# Patient Record
Sex: Female | Born: 1935 | Race: Black or African American | Hispanic: No | Smoking: Never smoker
Health system: Southern US, Community
[De-identification: ages and names within clinical notes are randomized; demographics above are authoritative.]

## PROBLEM LIST (undated history)

## (undated) DIAGNOSIS — K219 Gastro-esophageal reflux disease without esophagitis: Secondary | ICD-10-CM

## (undated) DIAGNOSIS — I10 Essential (primary) hypertension: Secondary | ICD-10-CM

## (undated) DIAGNOSIS — N186 End stage renal disease: Secondary | ICD-10-CM

## (undated) DIAGNOSIS — Z992 Dependence on renal dialysis: Secondary | ICD-10-CM

## (undated) DIAGNOSIS — E1122 Type 2 diabetes mellitus with diabetic chronic kidney disease: Secondary | ICD-10-CM

## (undated) DIAGNOSIS — R0600 Dyspnea, unspecified: Secondary | ICD-10-CM

## (undated) DIAGNOSIS — I350 Nonrheumatic aortic (valve) stenosis: Secondary | ICD-10-CM

## (undated) DIAGNOSIS — I4892 Unspecified atrial flutter: Secondary | ICD-10-CM

## (undated) DIAGNOSIS — I4891 Unspecified atrial fibrillation: Secondary | ICD-10-CM

## (undated) DIAGNOSIS — J189 Pneumonia, unspecified organism: Secondary | ICD-10-CM

## (undated) HISTORY — DX: End stage renal disease: N18.6

## (undated) HISTORY — DX: Type 2 diabetes mellitus with diabetic chronic kidney disease: E11.22

## (undated) HISTORY — PX: ABDOMINAL HYSTERECTOMY: SHX81

## (undated) HISTORY — PX: COLONOSCOPY: SHX174

## (undated) HISTORY — DX: Unspecified atrial flutter: I48.92

## (undated) HISTORY — PX: TUBAL LIGATION: SHX77

## (undated) HISTORY — DX: Dependence on renal dialysis: Z99.2

---

## 2013-04-14 ENCOUNTER — Encounter (HOSPITAL_BASED_OUTPATIENT_CLINIC_OR_DEPARTMENT_OTHER): Payer: Self-pay | Admitting: *Deleted

## 2013-04-14 ENCOUNTER — Inpatient Hospital Stay (HOSPITAL_BASED_OUTPATIENT_CLINIC_OR_DEPARTMENT_OTHER)
Admission: EM | Admit: 2013-04-14 | Discharge: 2013-04-16 | DRG: 684 | Disposition: A | Discharge status: Skilled Nursing Facility | Payer: Medicare Other | Attending: Internal Medicine | Admitting: Internal Medicine

## 2013-04-14 DIAGNOSIS — D638 Anemia in other chronic diseases classified elsewhere: Secondary | ICD-10-CM | POA: Diagnosis present

## 2013-04-14 DIAGNOSIS — I10 Essential (primary) hypertension: Secondary | ICD-10-CM | POA: Diagnosis present

## 2013-04-14 DIAGNOSIS — I129 Hypertensive chronic kidney disease with stage 1 through stage 4 chronic kidney disease, or unspecified chronic kidney disease: Secondary | ICD-10-CM | POA: Diagnosis present

## 2013-04-14 DIAGNOSIS — N179 Acute kidney failure, unspecified: Secondary | ICD-10-CM

## 2013-04-14 DIAGNOSIS — E119 Type 2 diabetes mellitus without complications: Secondary | ICD-10-CM | POA: Diagnosis present

## 2013-04-14 DIAGNOSIS — D649 Anemia, unspecified: Secondary | ICD-10-CM

## 2013-04-14 DIAGNOSIS — Z794 Long term (current) use of insulin: Secondary | ICD-10-CM | POA: Diagnosis present

## 2013-04-14 DIAGNOSIS — N184 Chronic kidney disease, stage 4 (severe): Secondary | ICD-10-CM | POA: Diagnosis present

## 2013-04-14 HISTORY — DX: Essential (primary) hypertension: I10

## 2013-04-14 LAB — CBC WITH DIFFERENTIAL/PLATELET
Basophils Absolute: 0 K/uL (ref 0.0–0.1)
Basophils Relative: 0 % (ref 0–1)
Eosinophils Absolute: 0.2 K/uL (ref 0.0–0.7)
Eosinophils Relative: 4 % (ref 0–5)
HCT: 22.2 % — ABNORMAL LOW (ref 36.0–46.0)
Hemoglobin: 7.7 g/dL — ABNORMAL LOW (ref 12.0–15.0)
Lymphocytes Relative: 25 % (ref 12–46)
Lymphs Abs: 1.3 K/uL (ref 0.7–4.0)
MCH: 29.8 pg (ref 26.0–34.0)
MCHC: 34.7 g/dL (ref 30.0–36.0)
MCV: 86 fL (ref 78.0–100.0)
Monocytes Absolute: 0.4 K/uL (ref 0.1–1.0)
Monocytes Relative: 8 % (ref 3–12)
Neutro Abs: 3.4 K/uL (ref 1.7–7.7)
Neutrophils Relative %: 64 % (ref 43–77)
Platelets: 214 K/uL (ref 150–400)
RBC: 2.58 MIL/uL — ABNORMAL LOW (ref 3.87–5.11)
RDW: 12.5 % (ref 11.5–15.5)
WBC: 5.4 K/uL (ref 4.0–10.5)

## 2013-04-14 LAB — OCCULT BLOOD X 1 CARD TO LAB, STOOL: Fecal Occult Bld: NEGATIVE

## 2013-04-14 LAB — URINE MICROSCOPIC-ADD ON

## 2013-04-14 LAB — BASIC METABOLIC PANEL
BUN: 62 mg/dL — ABNORMAL HIGH (ref 6–23)
Chloride: 104 mEq/L (ref 96–112)
GFR calc Af Amer: 11 mL/min — ABNORMAL LOW (ref >90)
Potassium: 5.3 mEq/L — ABNORMAL HIGH (ref 3.5–5.1)
Sodium: 134 mEq/L — ABNORMAL LOW (ref 135–145)

## 2013-04-14 LAB — URINALYSIS, ROUTINE W REFLEX MICROSCOPIC
Bilirubin Urine: NEGATIVE
Glucose, UA: NEGATIVE mg/dL
Ketones, ur: NEGATIVE mg/dL
Leukocytes, UA: NEGATIVE
Nitrite: NEGATIVE
Protein, ur: 100 mg/dL — AB
Specific Gravity, Urine: 1.009 (ref 1.005–1.030)
Urobilinogen, UA: 0.2 mg/dL (ref 0.0–1.0)
pH: 6 (ref 5.0–8.0)

## 2013-04-14 MED ORDER — SODIUM CHLORIDE 0.9 % IV BOLUS (SEPSIS)
1000.0000 mL | Freq: Once | INTRAVENOUS | Status: AC
  Administered 2013-04-14: 1000 mL via INTRAVENOUS

## 2013-04-14 NOTE — ED Notes (Signed)
Pt seen by PMD this am with blood drawn for 6 month visit , callt his afternoon bun and crt " high"

## 2013-04-14 NOTE — ED Provider Notes (Signed)
CSN: [id number]     Arrival date & time 04/14/13  7829 History     First MD Initiated Contact with Patient 04/14/13 2046     Chief Complaint  Patient presents with  . abdnormal labs    (Consider location/radiation/quality/duration/timing/severity/associated sxs/prior Treatment) HPI This asymptomatic 79 year old female has a history of type 2 diabetes and hypertension and had routine blood drawn today at routine 70-month checkup and was called in and told that she had elevated kidney tests and told to come to the hospital. she is no chest pain no palpitations no shortness of breath no edema no nausea no vomiting no confusion no decreased urination or other concerns. There is no treatment prior to arrival. Past Medical History  Diagnosis Date  . Hypertension   . Diabetes mellitus without complication    Past Surgical History  Procedure Laterality Date  . Abdominal hysterectomy    . Tubal ligation     History reviewed. No pertinent family history. History  Substance Use Topics  . Smoking status: Never Smoker   . Smokeless tobacco: Not on file  . Alcohol Use: No   OB History   Grav Para Term Preterm Abortions TAB SAB Ect Mult Living                 Review of Systems 10 Systems reviewed and are negative for acute change except as noted in the HPI. Allergies  Asa; Codeine; and Penicillins  Home Medications   No current outpatient prescriptions on file. BP 197/83  Pulse 70  Temp(Src) 98.6 F (37 C) (Oral)  Resp 16  Ht 5\' 5"  (1.651 m)  Wt 156 lb 8 oz (70.988 kg)  BMI 26.04 kg/m2  SpO2 98% Physical Exam  Nursing note and vitals reviewed. Constitutional:  Awake, alert, nontoxic appearance.  HENT:  Head: Atraumatic.  Eyes: Right eye exhibits no discharge. Left eye exhibits no discharge.  Neck: Neck supple.  Cardiovascular: Normal rate and regular rhythm.   No murmur heard. Pulmonary/Chest: Effort normal and breath sounds normal. No respiratory distress. She has no  wheezes. She has no rales. She exhibits no tenderness.  Abdominal: Soft. Bowel sounds are normal. She exhibits no distension. There is no tenderness. There is no rebound.  Musculoskeletal: She exhibits no edema and no tenderness.  Baseline ROM, no obvious new focal weakness.  Neurological: She is alert.  Mental status and motor strength appears baseline for patient and situation.  Skin: No rash noted.  Psychiatric: She has a normal mood and affect.    ED Course   Procedures (including critical care time) ECG: Sinus rhythm, first degree AV block, ventricular rate 68, normal axis, inferior lateral inverted T waves with slight ST depression, no comparison ECG available  [place] present for rectal.  D/w [facility] for transfer. Patient / Family / Caregiver informed of clinical course, understand medical decision-making process, and agree with plan. Labs Reviewed  BASIC METABOLIC PANEL - Abnormal; Notable for the following:    Sodium 134 (*)    Potassium 5.3 (*)    CO2 17 (*)    Glucose, Bld 114 (*)    BUN 62 (*)    Creatinine, Ser 4.20 (*)    GFR calc non Af Amer 9 (*)    GFR calc Af Amer 11 (*)    All other components within normal limits  CBC WITH DIFFERENTIAL - Abnormal; Notable for the following:    RBC 2.58 (*)    Hemoglobin 7.7 (*)    HCT  22.2 (*)    All other components within normal limits  URINALYSIS, ROUTINE W REFLEX MICROSCOPIC - Abnormal; Notable for the following:    Hgb urine dipstick TRACE (*)    Protein, ur 100 (*)    All other components within normal limits  GLUCOSE, CAPILLARY - Abnormal; Notable for the following:    Glucose-Capillary 152 (*)    All other components within normal limits  LIPID PANEL - Abnormal; Notable for the following:    LDL Cholesterol 101 (*)    All other components within normal limits  GLUCOSE, CAPILLARY - Abnormal; Notable for the following:    Glucose-Capillary 102 (*)    All other components within normal limits  URINE  MICROSCOPIC-ADD ON  OCCULT BLOOD X 1 CARD TO LAB, STOOL  GLUCOSE, CAPILLARY  HEMOGLOBIN A1C   US Renal  04/15/2013   *RADIOLOGY REPORT*  Clinical Data: Acute renal insufficiency  RENAL/URINARY TRACT ULTRASOUND COMPLETE  Comparison:  None.  Findings:  Right Kidney:  11.3 cm in length.  Increased cortical echogenicity. No hydronephrosis or mass.  Left Kidney:  9.6 cm in length.  Increased cortical echogenicity. No hydronephrosis or solid mass.  2.2 cm simple cyst in the upper pole of the left kidney.  Bladder:  Unremarkable.  IMPRESSION: No hydronephrosis.  Increased cortical echogenicity compatible with medical renal parenchymal disease.   Original Report Authenticated By: Jolaine Click, M.D.   1. Acute renal failure   2. Anemia   3. DM2 (diabetes mellitus, type 2)   4. HTN (hypertension)     MDM  The patient appears reasonably stabilized for transfer considering the current resources, flow, and capabilities available in the ED at this time, and I doubt any other [facility] requiring further screening and/or treatment in the ED prior to transfer.  Hurman Horn, MD 04/15/13 [phone]

## 2013-04-15 ENCOUNTER — Observation Stay (HOSPITAL_COMMUNITY): Payer: Medicare Other

## 2013-04-15 ENCOUNTER — Encounter (HOSPITAL_COMMUNITY): Payer: Self-pay

## 2013-04-15 DIAGNOSIS — E119 Type 2 diabetes mellitus without complications: Secondary | ICD-10-CM | POA: Diagnosis present

## 2013-04-15 DIAGNOSIS — D649 Anemia, unspecified: Secondary | ICD-10-CM

## 2013-04-15 DIAGNOSIS — N179 Acute kidney failure, unspecified: Secondary | ICD-10-CM

## 2013-04-15 DIAGNOSIS — I10 Essential (primary) hypertension: Secondary | ICD-10-CM

## 2013-04-15 LAB — LIPID PANEL
Cholesterol: 181 mg/dL (ref 0–200)
Triglycerides: 91 mg/dL (ref <150)

## 2013-04-15 LAB — GLUCOSE, CAPILLARY
Glucose-Capillary: 152 mg/dL — ABNORMAL HIGH (ref 70–99)
Glucose-Capillary: 95 mg/dL (ref 70–99)
Glucose-Capillary: 159 mg/dL — ABNORMAL HIGH (ref 70–99)

## 2013-04-15 LAB — PROTEIN / CREATININE RATIO, URINE: Protein Creatinine Ratio: 2.94 — ABNORMAL HIGH (ref 0.00–0.15)

## 2013-04-15 LAB — HEMOGLOBIN A1C: Mean Plasma Glucose: 140 mg/dL — ABNORMAL HIGH (ref <117)

## 2013-04-15 MED ORDER — HYDRALAZINE HCL 50 MG PO TABS
50.0000 mg | ORAL_TABLET | Freq: Three times a day (TID) | ORAL | Status: DC
  Administered 2013-04-15 – 2013-04-16 (×3): 50 mg via ORAL
  Filled 2013-04-15 (×6): qty 1

## 2013-04-15 MED ORDER — CLONIDINE HCL 0.2 MG PO TABS
0.2000 mg | ORAL_TABLET | Freq: Two times a day (BID) | ORAL | Status: DC
  Administered 2013-04-15 – 2013-04-16 (×3): 0.2 mg via ORAL
  Filled 2013-04-15 (×4): qty 1

## 2013-04-15 MED ORDER — INSULIN ASPART 100 UNIT/ML ~~LOC~~ SOLN: 0.0000 [IU] | Freq: Every day | SUBCUTANEOUS | Status: DC

## 2013-04-15 MED ORDER — SODIUM BICARBONATE 650 MG PO TABS
650.0000 mg | ORAL_TABLET | Freq: Three times a day (TID) | ORAL | Status: DC
  Administered 2013-04-15 – 2013-04-16 (×6): 650 mg via ORAL
  Filled 2013-04-15 (×9): qty 1

## 2013-04-15 MED ORDER — SODIUM CHLORIDE 0.9 % IV SOLN
INTRAVENOUS | Status: DC
  Administered 2013-04-15: 75 mL/h via INTRAVENOUS

## 2013-04-15 MED ORDER — LABETALOL HCL 300 MG PO TABS
300.0000 mg | ORAL_TABLET | Freq: Three times a day (TID) | ORAL | Status: DC
  Administered 2013-04-15 – 2013-04-16 (×4): 300 mg via ORAL
  Filled 2013-04-15 (×5): qty 1

## 2013-04-15 MED ORDER — LINAGLIPTIN 5 MG PO TABS
5.0000 mg | ORAL_TABLET | Freq: Every day | ORAL | Status: DC
Start: 2013-04-15 — End: 2013-04-16
  Administered 2013-04-15 – 2013-04-16 (×2): 5 mg via ORAL
  Filled 2013-04-15 (×2): qty 1

## 2013-04-15 MED ORDER — GLYBURIDE 5 MG PO TABS
5.0000 mg | ORAL_TABLET | Freq: Every day | ORAL | Status: DC
  Administered 2013-04-15 – 2013-04-16 (×2): 5 mg via ORAL
  Filled 2013-04-15 (×3): qty 1

## 2013-04-15 MED ORDER — INSULIN ASPART 100 UNIT/ML ~~LOC~~ SOLN
0.0000 [IU] | Freq: Three times a day (TID) | SUBCUTANEOUS | Status: DC
  Administered 2013-04-16: 2 [IU] via SUBCUTANEOUS

## 2013-04-15 MED ORDER — SODIUM CHLORIDE 0.9 % IV SOLN
INTRAVENOUS | Status: AC
  Administered 2013-04-15: 05:00:00 via INTRAVENOUS

## 2013-04-15 MED ORDER — LABETALOL HCL 200 MG PO TABS
200.0000 mg | ORAL_TABLET | Freq: Two times a day (BID) | ORAL | Status: DC
  Administered 2013-04-15: 200 mg via ORAL
  Filled 2013-04-15 (×2): qty 1

## 2013-04-15 NOTE — H&P (Signed)
[facility] History and Physical  Angela Trujillo ZOX:[id number] DOB: 1930-10-26    PCP:   Dante Gang.  Chief Complaint: sent in from PCP for elevated Cr.  HPI: Angela Trujillo is an 79 y.o. female with HTN, DM, and anemia, but doesn't think she has any CKD sent from PCP for elevated Cr.  She presented to [facility], but unfortunately there was no record of prior Cr of stages of her CKD.  Evaluation in the ER showed that she has Cr of 4.2 and bicarb of 17, with Hb of 7.7 grams per DL.  Dr Fonnie Jarvis asked me to admit her for work up since she has elevated Cr and anemia.  She is totally asymptomatic.  Rewiew of Systems:  Constitutional: Negative for malaise, fever and chills. No significant weight loss or weight gain Eyes: Negative for eye pain, redness and discharge, diplopia, visual changes, or flashes of light. ENMT: Negative for ear pain, hoarseness, nasal congestion, sinus pressure and sore throat. No headaches; tinnitus, drooling, or problem swallowing. Cardiovascular: Negative for chest pain, palpitations, diaphoresis, dyspnea and peripheral edema. ; No orthopnea, PND Respiratory: Negative for cough, hemoptysis, wheezing and stridor. No pleuritic chestpain. Gastrointestinal: Negative for nausea, vomiting, diarrhea, constipation, abdominal pain, melena, blood in stool, hematemesis, jaundice and rectal bleeding.    Genitourinary: Negative for frequency, dysuria, incontinence,flank pain and hematuria; Musculoskeletal: Negative for back pain and neck pain. Negative for swelling and trauma.;  Skin: . Negative for pruritus, rash, abrasions, bruising and skin lesion.; ulcerations Neuro: Negative for headache, lightheadedness and neck stiffness. Negative for weakness, altered level of consciousness , altered mental status, extremity weakness, burning feet, involuntary movement, seizure and syncope.  Psych: negative for anxiety, depression, insomnia, tearfulness, panic attacks, hallucinations,  paranoia, suicidal or homicidal ideation    Past Medical History  Diagnosis Date  . Hypertension   . Diabetes mellitus without complication     Past Surgical History  Procedure Laterality Date  . Abdominal hysterectomy    . Tubal ligation      Medications:  HOME MEDS: Prior to Admission medications   Medication Sig Start Date End Date Taking? Authorizing Provider  cloNIDine (CATAPRES) 0.2 MG tablet Take 0.2 mg by mouth 2 (two) times daily.   Yes Historical Provider, MD  glyBURIDE (DIABETA) 5 MG tablet Take 5 mg by mouth daily with breakfast.   Yes Historical Provider, MD  labetalol (NORMODYNE) 200 MG tablet Take 200 mg by mouth 2 (two) times daily.   Yes Historical Provider, MD  saxagliptin HCl (ONGLYZA) 2.5 MG TABS tablet Take 5 mg by mouth daily.   Yes Historical Provider, MD  sodium bicarbonate 650 MG tablet Take 650 mg by mouth 4 (four) times daily.   Yes Historical Provider, MD     Allergies:  Allergies  Allergen Reactions  . Asa (Aspirin)   . Codeine   . Penicillins     Social History:   reports that she has never smoked. She does not have any smokeless tobacco history on file. She reports that she does not drink alcohol. Her drug history is not on file.  Family History: History reviewed. No pertinent family history.   Physical Exam: Filed Vitals:   04/14/13 2124 04/15/13 0029 04/15/13 0302 04/15/13 0500  BP: 171/71 178/72 176/67 197/83  Pulse: 69 68 69 70  Temp:    98.6 F (37 C)  TempSrc:    Oral  Resp: 15 17 14 16   Height:    5\' 5"  (1.651 m)  Weight:  70.988 kg (156 lb 8 oz)  SpO2: 100% 99% 100% 98%   Blood pressure 197/83, pulse 70, temperature 98.6 F (37 C), temperature source Oral, resp. rate 16, height 5\' 5"  (1.651 m), weight 70.988 kg (156 lb 8 oz), SpO2 98.00%.  GEN:  Pleasant patient lying in the stretcher in no acute distress; cooperative with exam. PSYCH:  alert and oriented x4; does not appear anxious or depressed; affect is  appropriate. HEENT: Mucous membranes pink and anicteric; PERRLA; EOM intact; no cervical lymphadenopathy nor thyromegaly or carotid bruit; no JVD; There were no stridor. Neck is very supple. Breasts:: Not examined CHEST WALL: No tenderness CHEST: Normal respiration, clear to auscultation bilaterally.  HEART: Regular rate and rhythm.  There are no murmur, rub, or gallops.   BACK: No kyphosis or scoliosis; no CVA tenderness ABDOMEN: soft and non-tender; no masses, no organomegaly, normal abdominal bowel sounds; no pannus; no intertriginous candida. There is no rebound and no distention. Rectal Exam: Not done EXTREMITIES: No bone or joint deformity; age-appropriate arthropathy of the hands and knees; no edema; no ulcerations.  There is no calf tenderness. Genitalia: not examined PULSES: 2+ and symmetric SKIN: Normal hydration no rash or ulceration CNS: Cranial nerves 2-12 grossly intact no focal lateralizing neurologic deficit.  Speech is fluent; uvula elevated with phonation, facial symmetry and tongue midline. DTR are normal bilaterally, cerebella exam is intact, barbinski is negative and strengths are equaled bilaterally.  No sensory loss.   Labs on Admission:  Basic Metabolic Panel:  Recent Labs Lab 04/14/13 2057  NA 134*  K 5.3*  CL 104  CO2 17*  GLUCOSE 114*  BUN 62*  CREATININE 4.20*  CALCIUM 8.8   Liver Function Tests: No results found for this basename: AST, ALT, ALKPHOS, BILITOT, PROT, ALBUMIN,  in the last 168 hours No results found for this basename: LIPASE, AMYLASE,  in the last 168 hours No results found for this basename: AMMONIA,  in the last 168 hours CBC:  Recent Labs Lab 04/14/13 2057  WBC 5.4  NEUTROABS 3.4  HGB 7.7*  HCT 22.2*  MCV 86.0  PLT 214   Cardiac Enzymes: No results found for this basename: CKTOTAL, CKMB, CKMBINDEX, TROPONINI,  in the last 168 hours  CBG: No results found for this basename: GLUCAP,  in the last 168 hours   Radiological  Exams on Admission: No results found.  Assessment/Plan Present on Admission:  . HTN (hypertension) . DM2 (diabetes mellitus, type 2) AKI Anemia.  PLAN:  I don't know what her H/H nor Cr are at baseline.  Given that her PCP sent her in, I assume that it was a change.  She knew about her anemia, and is asymptomatic, so I think it is chronic.  I strongly suspect she has CKD, given her medication list has bicarb tablets.  Please get more information from her PCP to elucidate the process.  I will give her IVF gently, and obtain a renal US.  I have not transfuse her as I suspect she would not need to.  She is stable, full code, and will be admitted to [facility] service.  Updated patient and family on her situation.  Thank you for allowng me to partake in the care of this nice patient.  Other plans as per orders.  Code Status: FULL Unk Lightning, MD. [facility] Pager [phone] 7pm to 7am.  04/15/2013, 6:56 AM

## 2013-04-15 NOTE — Progress Notes (Signed)
Patient arrived on unit via EMS.  [facility] Admissions notified of patient's arrival.  Patient alert and oriented with no complaints of pain.  BP 176/67, HR 69, R 14, O2 100% on ra.

## 2013-04-15 NOTE — Progress Notes (Signed)
Patient seen and examined Admit for acute kidney injury, Most likely is anemia of chronic disease Renal ultrasound pending Gentle hydration Nephrology consultation if no improvement

## 2013-04-15 NOTE — Consult Note (Signed)
Angela Trujillo 04/15/2013 Angela Trujillo D Requesting Physician:  Dr Susie Cassette  Reason for Consult:  Renal failure, uncertain duration HPI: The patient is a 79 y.o. year-old with hx of DM and HTN who was seeing PCP yesterday for routine 6 mo follow up and was called to come to ED because of elevated "creatinine".  Creatinine here is 4.20 with BUN 62.  Hb 7.7, WBC 5.4 and normal plts 214k.  UA is negative. Renal US shows 11.3 and 9.5cm kidneys with inc'd echogenicity bilat.  BP is normal to high. EKG shows NSR with inf-lateral downsloping ST's and TWI.    Meds at home include clonidine, glyburide, labetalol, NaHCO3, saxagliptin and Fe pills.  There are no old notes in EPIC from [facility].  No old creatinine available.   Patient denies and complaints, entirely negative ROS.  Has 5 children, one son is diabetic, 74 yo and has been on dialysis for 3 years in [place] area.    ROS  no fever HA  no cp sob  no n/v/d  no abd pain  no dysuria or difficulty voiding or hematuria  no jt pain, hairloss or mouth ulcers  no skin lesions  no paralysis or numbness of ext  no ankle swelling  no hx of blood clots  Past Medical History  Past Medical History  Diagnosis Date  . Hypertension   . Diabetes mellitus without complication    Past Surgical History  Past Surgical History  Procedure Laterality Date  . Abdominal hysterectomy    . Tubal ligation     Family History History reviewed. No pertinent family history. Social History  reports that she has never smoked. She does not have any smokeless tobacco history on file. She reports that she does not drink alcohol. Her drug history is not on file. Allergies  Allergies  Allergen Reactions  . Asa (Aspirin)   . Codeine   . Penicillins    Home medications Prior to Admission medications   Medication Sig Start Date End Date Taking? Authorizing Provider  cloNIDine (CATAPRES) 0.2 MG tablet Take 0.2 mg by mouth 2 (two) times daily.   Yes Historical  Provider, MD  ferrous sulfate 325 (65 FE) MG tablet Take 325 mg by mouth daily with breakfast.   Yes Historical Provider, MD  glyBURIDE (DIABETA) 5 MG tablet Take 5 mg by mouth daily with breakfast.   Yes Historical Provider, MD  labetalol (NORMODYNE) 200 MG tablet Take 200 mg by mouth 2 (two) times daily.   Yes Historical Provider, MD  saxagliptin HCl (ONGLYZA) 5 MG TABS tablet Take 5 mg by mouth daily.   Yes Historical Provider, MD  sodium bicarbonate 650 MG tablet Take 650 mg by mouth daily.   Yes Historical Provider, MD   Liver Function Tests No results found for this basename: AST, ALT, ALKPHOS, BILITOT, PROT, ALBUMIN,  in the last 168 hours No results found for this basename: LIPASE, AMYLASE,  in the last 168 hours CBC  Recent Labs Lab 04/14/13 2057  WBC 5.4  NEUTROABS 3.4  HGB 7.7*  HCT 22.2*  MCV 86.0  PLT 214   Basic Metabolic Panel  Recent Labs Lab 04/14/13 2057  NA 134*  K 5.3*  CL 104  CO2 17*  GLUCOSE 114*  BUN 62*  CREATININE 4.20*  CALCIUM 8.8    Physical Exam  Blood pressure 158/76, pulse 74, temperature 97.8 F (36.6 C), temperature source Oral, resp. rate 16, height 5\' 5"  (1.651 m), weight 70.988 kg (156 lb 8  oz), SpO2 98.00%. Gen: alert older adult female, looks younger than stated age, no distress, calm Skin: no rash, cyanosis HEENT:  EOMI, sclera anicteric, throat clear, +left ant cervical nodes Neck: + JVD, bilat bruits vs transmitted M Chest: clear bilat no rales or wheezing, good air movement CV: regular, no rub, +holosystolic blowing M at apex, +2/6 SEM at base > carotids, fem/popliteal/pedal pulses strong Abdomen: soft, nontender, no HSM no ascites or masses Ext: no LE edema, no joint effusion, atrophic skin chgs LE's, no gangrene/ulcers Neuro: alert, Ox3, nonfocal   Impression/Rec:  1 Acute vs CKD- do not have old lab data; anemia is worrisome for CKD and/or myeloma.  UA benign, minimal protein, US echogenic kidneys one of which is on the  small side.  No vol depletion or overload on exam, not on nsaid's, acei or other nephrotoxins.  Long hx of DM but never insulin and prob not severe diabetic disease. Also 20 yr hx of HTN and BP high.  Will order some serologies, myeloma screens, anemia w/u, cont IVF's, urine lytes, phos, PTH, ECHO.  No indication for HD at this time. Will follow. Need to get office records on Monday. 2 DM 7yrs 3 HTN 20 yrs 4 Heart murmur x 2  Vinson Moselle  MD Pager [phone]    Cell  [phone] 04/15/2013, 7:58 PM

## 2013-04-15 NOTE — Progress Notes (Signed)
PENDING ACCEPTANCE TRANFER NOTE:  Call received from:  Dr Fonnie Jarvis of [facility]  REASON FOR REQUESTING TRANSFER: AKI and anemia.  HPI:    Asymptomatic sent in by PCP for abnormal labs.  Cr 4 without known baseline.  Hb 7.7 g/DL without prior labs available     PLAN:  According to telephone report, this patient was accepted for transfer to [facility].   I have requested an order be written to call Flow Manager at [phone] upon patient arrival to the floor for final physician assignment who will do the admission and give admitting orders.  SIGNEDHouston Siren, MD [facility]  04/15/2013, 12:05 AM

## 2013-04-16 DIAGNOSIS — N179 Acute kidney failure, unspecified: Principal | ICD-10-CM

## 2013-04-16 LAB — IRON AND TIBC
Saturation Ratios: 20 % (ref 20–55)
TIBC: 240 ug/dL — ABNORMAL LOW (ref 250–470)
UIBC: 192 ug/dL (ref 125–400)

## 2013-04-16 LAB — RENAL FUNCTION PANEL
Albumin: 2.8 g/dL — ABNORMAL LOW (ref 3.5–5.2)
CO2: 16 mEq/L — ABNORMAL LOW (ref 19–32)
Calcium: 8 mg/dL — ABNORMAL LOW (ref 8.4–10.5)
Creatinine, Ser: 3.84 mg/dL — ABNORMAL HIGH (ref 0.50–1.10)
GFR calc Af Amer: 12 mL/min — ABNORMAL LOW (ref >90)
GFR calc non Af Amer: 10 mL/min — ABNORMAL LOW (ref >90)
Phosphorus: 4.5 mg/dL (ref 2.3–4.6)
Sodium: 133 mEq/L — ABNORMAL LOW (ref 135–145)

## 2013-04-16 LAB — HEPATIC FUNCTION PANEL
ALT: 10 U/L (ref 0–35)
Albumin: 2.8 g/dL — ABNORMAL LOW (ref 3.5–5.2)
Alkaline Phosphatase: 88 U/L (ref 39–117)

## 2013-04-16 LAB — COMPREHENSIVE METABOLIC PANEL
ALT: 9 U/L (ref 0–35)
Alkaline Phosphatase: 87 U/L (ref 39–117)
CO2: 15 mEq/L — ABNORMAL LOW (ref 19–32)
GFR calc Af Amer: 11 mL/min — ABNORMAL LOW (ref >90)
GFR calc non Af Amer: 10 mL/min — ABNORMAL LOW (ref >90)
Glucose, Bld: 107 mg/dL — ABNORMAL HIGH (ref 70–99)
Potassium: 4.7 mEq/L (ref 3.5–5.1)
Sodium: 135 mEq/L (ref 135–145)
Total Bilirubin: 0.2 mg/dL — ABNORMAL LOW (ref 0.3–1.2)

## 2013-04-16 LAB — VITAMIN B12: Vitamin B-12: 523 pg/mL (ref 211–911)

## 2013-04-16 LAB — GLUCOSE, CAPILLARY: Glucose-Capillary: 98 mg/dL (ref 70–99)

## 2013-04-16 LAB — CBC
HCT: 21.4 % — ABNORMAL LOW (ref 36.0–46.0)
Hemoglobin: 7.7 g/dL — ABNORMAL LOW (ref 12.0–15.0)
WBC: 5.3 10*3/uL (ref 4.0–10.5)

## 2013-04-16 LAB — FOLATE: Folate: >20 ng/mL

## 2013-04-16 LAB — URIC ACID: Uric Acid, Serum: 8.4 mg/dL — ABNORMAL HIGH (ref 2.4–7.0)

## 2013-04-16 MED ORDER — HYDRALAZINE HCL 100 MG PO TABS: 100.0000 mg | ORAL_TABLET | Freq: Three times a day (TID) | ORAL | Status: DC

## 2013-04-16 MED ORDER — CLONIDINE HCL 0.2 MG PO TABS
0.2000 mg | ORAL_TABLET | Freq: Three times a day (TID) | ORAL | Status: DC
  Administered 2013-04-16: 0.2 mg via ORAL
  Filled 2013-04-16 (×2): qty 1

## 2013-04-16 MED ORDER — HYDRALAZINE HCL 50 MG PO TABS
100.0000 mg | ORAL_TABLET | Freq: Three times a day (TID) | ORAL | Status: DC
  Administered 2013-04-16: 100 mg via ORAL
  Filled 2013-04-16 (×3): qty 2

## 2013-04-16 MED ORDER — SODIUM BICARBONATE 650 MG PO TABS: 650.0000 mg | ORAL_TABLET | Freq: Four times a day (QID) | ORAL | Status: DC

## 2013-04-16 NOTE — Progress Notes (Signed)
Pt signed d/c papers. Verbalized understanding of follow-up appt with MD, IV removed. Educated on new bp medications and Renal MDs recommendation to establish HD access if desired. Encouraged pt to make wishes known regarding HD initiation, educated on risk factors, diet, exercise.

## 2013-04-16 NOTE — Discharge Summary (Signed)
Physician Discharge Summary  Angela Trujillo MRN: [id number] DOB/AGE: August 04, 1931 79 y.o.  PCP: No primary provider on file.   Admit date: 04/14/2013 Discharge date: 04/16/2013  Discharge Diagnoses:      Anemia   Acute renal failure   HTN (hypertension)   DM2 (diabetes mellitus, type 2)     Medication List         cloNIDine 0.2 MG tablet  Commonly known as:  CATAPRES  Take 0.2 mg by mouth 2 (two) times daily.     ferrous sulfate 325 (65 FE) MG tablet  Take 325 mg by mouth daily with breakfast.     glyBURIDE 5 MG tablet  Commonly known as:  DIABETA  Take 5 mg by mouth daily with breakfast.     hydrALAZINE 100 MG tablet  Commonly known as:  APRESOLINE  Take 1 tablet (100 mg total) by mouth every 8 (eight) hours.     labetalol 200 MG tablet  Commonly known as:  NORMODYNE  Take 200 mg by mouth 2 (two) times daily.     ONGLYZA 5 MG Tabs tablet  Generic drug:  saxagliptin HCl  Take 5 mg by mouth daily.     sodium bicarbonate 650 MG tablet  Take 1 tablet (650 mg total) by mouth 4 (four) times daily.         Disposition: Final discharge disposition not confirmed   Consults:  nephrology  Significant Diagnostic Studies: Dg Chest 2 View  04/15/2013   *RADIOLOGY REPORT*  Clinical Data: New onset renal failure, history of hypertension, diabetes  CHEST - 2 VIEW  Comparison: None.  Findings: Normal cardiac silhouette and mediastinal contours.  No focal airspace opacities.  No pleural effusion or pneumothorax. Cardiac monitoring lead pads Trujillo the bilateral upper lungs.  No evidence of edema.  No acute osseous abnormalities.  IMPRESSION: No acute cardiopulmonary disease.   Original Report Authenticated By: Tacey Ruiz, MD   US Renal  04/15/2013   *RADIOLOGY REPORT*  Clinical Data: Acute renal insufficiency  RENAL/URINARY TRACT ULTRASOUND COMPLETE  Comparison:  None.  Findings:  Right Kidney:  11.3 cm in length.  Increased cortical echogenicity. No hydronephrosis or  mass.  Left Kidney:  9.6 cm in length.  Increased cortical echogenicity. No hydronephrosis or solid mass.  2.2 cm simple cyst in the upper pole of the left kidney.  Bladder:  Unremarkable.  IMPRESSION: No hydronephrosis.  Increased cortical echogenicity compatible with medical renal parenchymal disease.   Original Report Authenticated By: Jolaine Click, M.D.     Microbiology: No results found for this or any previous visit (from the past 240 hour(s)).   Labs: Results for orders placed during the hospital encounter of 04/14/13 (from the past 48 hour(s))  BASIC METABOLIC PANEL     Status: Abnormal   Collection Time    04/14/13  8:57 PM      Result Value Range   Sodium 134 (*) 135 - 145 mEq/L   Potassium 5.3 (*) 3.5 - 5.1 mEq/L   Chloride 104  96 - 112 mEq/L   CO2 17 (*) 19 - 32 mEq/L   Glucose, Bld 114 (*) 70 - 99 mg/dL   BUN 62 (*) 6 - 23 mg/dL   Creatinine, Ser 4.09 (*) 0.50 - 1.10 mg/dL   Calcium 8.8  8.4 - 81.1 mg/dL   GFR calc non Af Amer 9 (*) >90 mL/min   GFR calc Af Amer 11 (*) >90 mL/min   Comment:  The eGFR has been calculated     using the CKD EPI equation.     This calculation has not been     validated in all clinical     situations.     eGFR's persistently     <90 mL/min signify     possible Chronic Kidney Disease.  CBC WITH DIFFERENTIAL     Status: Abnormal   Collection Time    04/14/13  8:57 PM      Result Value Range   WBC 5.4  4.0 - 10.5 K/uL   RBC 2.58 (*) 3.87 - 5.11 MIL/uL   Hemoglobin 7.7 (*) 12.0 - 15.0 g/dL   HCT 16.1 (*) 09.6 - 04.5 %   MCV 86.0  78.0 - 100.0 fL   MCH 29.8  26.0 - 34.0 pg   MCHC 34.7  30.0 - 36.0 g/dL   RDW 40.9  81.1 - 91.4 %   Platelets 214  150 - 400 K/uL   Neutrophils Relative % 64  43 - 77 %   Neutro Abs 3.4  1.7 - 7.7 K/uL   Lymphocytes Relative 25  12 - 46 %   Lymphs Abs 1.3  0.7 - 4.0 K/uL   Monocytes Relative 8  3 - 12 %   Monocytes Absolute 0.4  0.1 - 1.0 K/uL   Eosinophils Relative 4  0 - 5 %   Eosinophils  Absolute 0.2  0.0 - 0.7 K/uL   Basophils Relative 0  0 - 1 %   Basophils Absolute 0.0  0.0 - 0.1 K/uL  URINALYSIS, ROUTINE W REFLEX MICROSCOPIC     Status: Abnormal   Collection Time    04/14/13 10:26 PM      Result Value Range   Color, Urine YELLOW  YELLOW   APPearance CLEAR  CLEAR   Specific Gravity, Urine 1.009  1.005 - 1.030   pH 6.0  5.0 - 8.0   Glucose, UA NEGATIVE  NEGATIVE mg/dL   Hgb urine dipstick TRACE (*) NEGATIVE   Bilirubin Urine NEGATIVE  NEGATIVE   Ketones, ur NEGATIVE  NEGATIVE mg/dL   Protein, ur 782 (*) NEGATIVE mg/dL   Urobilinogen, UA 0.2  0.0 - 1.0 mg/dL   Nitrite NEGATIVE  NEGATIVE   Leukocytes, UA NEGATIVE  NEGATIVE  URINE MICROSCOPIC-ADD ON     Status: None   Collection Time    04/14/13 10:26 PM      Result Value Range   Squamous Epithelial / LPF RARE  RARE   WBC, UA 0-2  <3 WBC/hpf   RBC / HPF 0-2  <3 RBC/hpf   Bacteria, UA RARE  RARE  OCCULT BLOOD X 1 CARD TO LAB, STOOL     Status: None   Collection Time    04/14/13 10:39 PM      Result Value Range   Fecal Occult Bld NEGATIVE  NEGATIVE  GLUCOSE, CAPILLARY     Status: Abnormal   Collection Time    04/15/13  4:33 AM      Result Value Range   Glucose-Capillary 152 (*) 70 - 99 mg/dL  GLUCOSE, CAPILLARY     Status: None   Collection Time    04/15/13  7:45 AM      Result Value Range   Glucose-Capillary 95  70 - 99 mg/dL  GLUCOSE, CAPILLARY     Status: Abnormal   Collection Time    04/15/13 11:17 AM      Result Value Range   Glucose-Capillary 102 (*)  70 - 99 mg/dL  HEMOGLOBIN X9J     Status: Abnormal   Collection Time    04/15/13 11:20 AM      Result Value Range   Hemoglobin A1C 6.5 (*) <5.7 %   Comment: (NOTE)                                                                               According to the ADA Clinical Practice Recommendations for 2011, when     HbA1c is used as a screening test:      >=6.5%   Diagnostic of Diabetes Mellitus               (if abnormal result is confirmed)      5.7-6.4%   Increased risk of developing Diabetes Mellitus     References:Diagnosis and Classification of Diabetes Mellitus,Diabetes     Care,2011,34(Suppl 1):S62-S69 and Standards of Medical Care in             Diabetes - 2011,Diabetes Care,2011,34 (Suppl 1):S11-S61.   Mean Plasma Glucose 140 (*) <117 mg/dL  LIPID PANEL     Status: Abnormal   Collection Time    04/15/13 11:20 AM      Result Value Range   Cholesterol 181  0 - 200 mg/dL   Triglycerides 91  <478 mg/dL   HDL 62  >29 mg/dL   Total CHOL/HDL Ratio 2.9     VLDL 18  0 - 40 mg/dL   LDL Cholesterol 562 (*) 0 - 99 mg/dL   Comment:            Total Cholesterol/HDL:CHD Risk     Coronary Heart Disease Risk Table                         Men   Women      1/2 Average Risk   3.4   3.3      Average Risk       5.0   4.4      2 X Average Risk   9.6   7.1      3 X Average Risk  23.4   11.0                Use the calculated Patient Ratio     above and the CHD Risk Table     to determine the patient's CHD Risk.                ATP III CLASSIFICATION (LDL):      <100     mg/dL   Optimal      130-865  mg/dL   Near or Above                        Optimal      130-159  mg/dL   Borderline      784-696  mg/dL   High      >295     mg/dL   Very High  GLUCOSE, CAPILLARY     Status: Abnormal   Collection Time    04/15/13  5:14 PM      Result  Value Range   Glucose-Capillary 125 (*) 70 - 99 mg/dL  GLUCOSE, CAPILLARY     Status: Abnormal   Collection Time    04/15/13  9:08 PM      Result Value Range   Glucose-Capillary 159 (*) 70 - 99 mg/dL   Comment 1 Documented in Chart     Comment 2 Notify RN    SODIUM, URINE, RANDOM     Status: None   Collection Time    04/15/13  9:14 PM      Result Value Range   Sodium, Ur 57    CREATININE, URINE, RANDOM     Status: None   Collection Time    04/15/13  9:14 PM      Result Value Range   Creatinine, Urine 42.41    PROTEIN / CREATININE RATIO, URINE     Status: Abnormal   Collection Time     04/15/13  9:14 PM      Result Value Range   Creatinine, Urine 42.89     Total Protein, Urine 126.2     Comment: NO NORMAL RANGE ESTABLISHED FOR THIS TEST   PROTEIN CREATININE RATIO 2.94 (*) 0.00 - 0.15  FERRITIN     Status: None   Collection Time    04/15/13 11:00 PM      Result Value Range   Ferritin 72  10 - 291 ng/mL  IRON AND TIBC     Status: Abnormal   Collection Time    04/15/13 11:00 PM      Result Value Range   Iron 48  42 - 135 ug/dL   TIBC 409 (*) 811 - 914 ug/dL   Saturation Ratios 20  20 - 55 %   UIBC 192  125 - 400 ug/dL  VITAMIN N82     Status: None   Collection Time    04/15/13 11:00 PM      Result Value Range   Vitamin B-12 523  211 - 911 pg/mL  RENAL FUNCTION PANEL     Status: Abnormal   Collection Time    04/15/13 11:00 PM      Result Value Range   Sodium 133 (*) 135 - 145 mEq/L   Potassium 4.7  3.5 - 5.1 mEq/L   Chloride 106  96 - 112 mEq/L   CO2 16 (*) 19 - 32 mEq/L   Glucose, Bld 140 (*) 70 - 99 mg/dL   BUN 53 (*) 6 - 23 mg/dL   Creatinine, Ser 9.56 (*) 0.50 - 1.10 mg/dL   Calcium 8.0 (*) 8.4 - 10.5 mg/dL   Phosphorus 4.5  2.3 - 4.6 mg/dL   Albumin 2.8 (*) 3.5 - 5.2 g/dL   GFR calc non Af Amer 10 (*) >90 mL/min   GFR calc Af Amer 12 (*) >90 mL/min   Comment:            The eGFR has been calculated     using the CKD EPI equation.     This calculation has not been     validated in all clinical     situations.     eGFR's persistently     <90 mL/min signify     possible Chronic Kidney Disease.  HEPATIC FUNCTION PANEL     Status: Abnormal   Collection Time    04/15/13 11:00 PM      Result Value Range   Total Protein 5.7 (*) 6.0 - 8.3 g/dL   Albumin 2.8 (*) 3.5 - 5.2 g/dL  AST 16  0 - 37 U/L   ALT 10  0 - 35 U/L   Alkaline Phosphatase 88  39 - 117 U/L   Total Bilirubin 0.2 (*) 0.3 - 1.2 mg/dL   Bilirubin, Direct <8.1  0.0 - 0.3 mg/dL   Indirect Bilirubin NOT CALCULATED  0.3 - 0.9 mg/dL  URIC ACID     Status: Abnormal   Collection Time     04/15/13 11:00 PM      Result Value Range   Uric Acid, Serum 8.4 (*) 2.4 - 7.0 mg/dL  FOLATE     Status: None   Collection Time    04/15/13 11:00 PM      Result Value Range   Folate >20.0     Comment: (NOTE)     Reference Ranges            Deficient:       0.4 - 3.3 ng/mL            Indeterminate:   3.4 - 5.4 ng/mL            Normal:              > 5.4 ng/mL     CORRECTED ON 07/27 AT 1320: PREVIOUSLY REPORTED AS >20.0  RETICULOCYTES     Status: Abnormal   Collection Time    04/15/13 11:00 PM      Result Value Range   Retic Ct Pct 2.1  0.4 - 3.1 %   RBC. 2.36 (*) 3.87 - 5.11 MIL/uL   Retic Count, Manual 49.6  19.0 - 186.0 K/uL  COMPREHENSIVE METABOLIC PANEL     Status: Abnormal   Collection Time    04/16/13  5:10 AM      Result Value Range   Sodium 135  135 - 145 mEq/L   Potassium 4.7  3.5 - 5.1 mEq/L   Chloride 109  96 - 112 mEq/L   CO2 15 (*) 19 - 32 mEq/L   Glucose, Bld 107 (*) 70 - 99 mg/dL   BUN 52 (*) 6 - 23 mg/dL   Creatinine, Ser 1.91 (*) 0.50 - 1.10 mg/dL   Calcium 8.4  8.4 - 47.8 mg/dL   Total Protein 5.6 (*) 6.0 - 8.3 g/dL   Albumin 2.8 (*) 3.5 - 5.2 g/dL   AST 12  0 - 37 U/L   ALT 9  0 - 35 U/L   Alkaline Phosphatase 87  39 - 117 U/L   Total Bilirubin 0.2 (*) 0.3 - 1.2 mg/dL   GFR calc non Af Amer 10 (*) >90 mL/min   GFR calc Af Amer 11 (*) >90 mL/min   Comment:            The eGFR has been calculated     using the CKD EPI equation.     This calculation has not been     validated in all clinical     situations.     eGFR's persistently     <90 mL/min signify     possible Chronic Kidney Disease.  GLUCOSE, CAPILLARY     Status: None   Collection Time    04/16/13  7:37 AM      Result Value Range   Glucose-Capillary 98  70 - 99 mg/dL  GLUCOSE, CAPILLARY     Status: Abnormal   Collection Time    04/16/13 11:29 AM      Result Value Range   Glucose-Capillary 194 (*) 70 - 99  mg/dL  CBC     Status: Abnormal   Collection Time    04/16/13 11:37 AM       Result Value Range   WBC 5.3  4.0 - 10.5 K/uL   RBC 2.56 (*) 3.87 - 5.11 MIL/uL   Hemoglobin 7.7 (*) 12.0 - 15.0 g/dL   HCT 16.1 (*) 09.6 - 04.5 %   MCV 83.6  78.0 - 100.0 fL   MCH 30.1  26.0 - 34.0 pg   MCHC 36.0  30.0 - 36.0 g/dL   RDW 40.9  81.1 - 91.4 %   Platelets 193  150 - 400 K/uL     HPI :  79 y.o. year-old with hx of DM and HTN who was seeing PCP yesterday for routine 6 mo follow up and was called to come to ED because of elevated "creatinine". Creatinine here is 4.20 with BUN 62. Hb 7.7, WBC 5.4 and normal plts 214k. UA is negative. Renal US shows 11.3 and 9.5cm kidneys with inc'd echogenicity bilat. BP is normal to high. EKG shows NSR with inf-lateral downsloping ST's and TWI.  Meds at home include clonidine, glyburide, labetalol, NaHCO3, saxagliptin and Fe pills. There are no old notes in EPIC from [facility]. No old creatinine available.  Patient denies and complaints, entirely negative ROS. Has 5 children, one son is diabetic, 81 yo and has been on dialysis for 3 years in [place] area.    HOSPITAL COURSE:  Acute on chronic kidney disease, baseline unknown  Workup underway  Evaluate for multiple myeloma,  Multiple serologies pending  Likely has underlying chronic kidney disease given long-standing hypertension and blood pressure  On sodium bicarbonate tablets  records obtained from pt's PCP office today show pt has stage IV, maybe stage V CKD; creat in Feb '14 was 3.5 and here is 3.93. She states today that she has a kidney specialist, Dr Mikey Kirschner, in [facility] at [facility] office, and was last seen around February. No further workup recommended here, pt has advanced stage CKD, recommended access placement to patient asap, but she did not seem enthusiastic about that idea. She will f/u with her nephrologist asap, discussed with Johna Sheriff, pt's son as well and questions answered. OK for d/c,    Anemia  Repeat CBC pending  Anemia panel pending  Likely anemia of chronic  disease     Diabetes mellitus excellent hemoglobin A1c of 6.5  Continue sliding scale insulin   Hypertension  Increase clonidine 3 times a day  Initiated on hydralazine yesterday  On labetalol       Discharge Exam:  Blood pressure 178/71, pulse 80, temperature 98.8 F (37.1 C), temperature source Oral, resp. rate 18, height 5\' 5"  (1.651 m), weight 71.6 kg (157 lb 13.6 oz), SpO2 96.00%.  Gen: alert older adult female, looks younger than stated age, no distress, calm  Skin: no rash, cyanosis  HEENT: EOMI, sclera anicteric, throat clear, +left ant cervical nodes  Neck: + JVD, bilat bruits vs transmitted M  Chest: clear bilat no rales or wheezing, good air movement  CV: regular, no rub, +holosystolic blowing M at apex, +2/6 SEM at base > carotids, fem/popliteal/pedal pulses strong  Abdomen: soft, nontender, no HSM no ascites or masses  Ext: no LE edema, no joint effusion, atrophic skin chgs LE's, no gangrene/ulcers  Neuro: alert, Ox3, nonfocal             Follow-up Information   Follow up with pcp-dr pasupala  In 1 week. (bmp  in one week)       Signed: Richarda Trujillo 04/16/2013, 2:35 PM

## 2013-04-16 NOTE — Progress Notes (Signed)
[facility] PROGRESS NOTE  Natane Heward WUX:[id number] DOB: 10/21/1930 DOA: 04/14/2013 PCP: No primary provider on file.  Assessment/Plan: Active Problems:   Anemia   Acute renal failure   HTN (hypertension)   DM2 (diabetes mellitus, type 2)   Acute on chronic kidney disease, baseline unknown Workup underway Evaluate for multiple myeloma, Multiple serologies pending Likely has underlying chronic kidney disease given long-standing hypertension and blood pressure On sodium bicarbonate tablets  Anemia Repeat CBC pending Anemia panel pending Likely anemia of chronic disease  Diabetes mellitus excellent hemoglobin A1c of 6.5 Continue sliding scale insulin  Hypertension Increase clonidine 3 times a day Initiated on hydralazine yesterday On labetalol   Code Status: full Family Communication: family updated about patient's clinical progress Disposition Plan:  As above    Brief narrative: 79 y.o. year-old with hx of DM and HTN who was seeing PCP yesterday for routine 6 mo follow up and was called to come to ED because of elevated "creatinine". Creatinine here is 4.20 with BUN 62. Hb 7.7, WBC 5.4 and normal plts 214k. UA is negative. Renal US shows 11.3 and 9.5cm kidneys with inc'd echogenicity bilat. BP is normal to high. EKG shows NSR with inf-lateral downsloping ST's and TWI.  Meds at home include clonidine, glyburide, labetalol, NaHCO3, saxagliptin and Fe pills. There are no old notes in EPIC from [facility]. No old creatinine available.  Patient denies and complaints, entirely negative ROS. Has 5 children, one son is diabetic, 21 yo and has been on dialysis for 3 years in [place] area.      Consultants:  Nephrology  Procedures:  None   None  HPI/Subjective: No complaints,  Objective: Filed Vitals:   04/15/13 2110 04/15/13 2244 04/16/13 0549 04/16/13 1000  BP: 210/90 161/68 172/69 178/71  Pulse: 72 74 76 80  Temp: 97.9 F (36.6 C)  98.6 F (37 C) 98.8  F (37.1 C)  TempSrc: Oral  Oral Oral  Resp: 18  18 18   Height: 5\' 5"  (1.651 m)     Weight: 71.6 kg (157 lb 13.6 oz)     SpO2: 100%  98% 96%    Intake/Output Summary (Last 24 hours) at 04/16/13 1106 Last data filed at 04/16/13 0900  Gross per 24 hour  Intake   1125 ml  Output    600 ml  Net    525 ml    Exam:  HENT:  Head: Atraumatic.  Nose: Nose normal.  Mouth/Throat: Oropharynx is clear and moist.  Eyes: Conjunctivae are normal. Pupils are equal, round, and reactive to light. No scleral icterus.  Neck: Neck supple. No tracheal deviation present.  Cardiovascular: Normal rate, regular rhythm, normal heart sounds and intact distal pulses.  Pulmonary/Chest: Effort normal and breath sounds normal. No respiratory distress.  Abdominal: Soft. Normal appearance and bowel sounds are normal. She exhibits no distension. There is no tenderness.  Musculoskeletal: She exhibits no edema and no tenderness.  Neurological: She is alert. No cranial nerve deficit.    Data Reviewed: Basic Metabolic Panel:  Recent Labs Lab 04/14/13 2057 04/15/13 2300 04/16/13 0510  NA 134* 133* 135  K 5.3* 4.7 4.7  CL 104 106 109  CO2 17* 16* 15*  GLUCOSE 114* 140* 107*  BUN 62* 53* 52*  CREATININE 4.20* 3.84* 3.93*  CALCIUM 8.8 8.0* 8.4  PHOS  --  4.5  --     Liver Function Tests:  Recent Labs Lab 04/15/13 2300 04/16/13 0510  AST 16 12  ALT 10 9  ALKPHOS 88 87  BILITOT 0.2* 0.2*  PROT 5.7* 5.6*  ALBUMIN 2.8*  2.8* 2.8*   No results found for this basename: LIPASE, AMYLASE,  in the last 168 hours No results found for this basename: AMMONIA,  in the last 168 hours  CBC:  Recent Labs Lab 04/14/13 2057  WBC 5.4  NEUTROABS 3.4  HGB 7.7*  HCT 22.2*  MCV 86.0  PLT 214    Cardiac Enzymes: No results found for this basename: CKTOTAL, CKMB, CKMBINDEX, TROPONINI,  in the last 168 hours BNP (last 3 results) No results found for this basename: PROBNP,  in the last 8760  hours   CBG:  Recent Labs Lab 04/15/13 0745 04/15/13 1117 04/15/13 1714 04/15/13 2108 04/16/13 0737  GLUCAP 95 102* 125* 159* 98    No results found for this or any previous visit (from the past 240 hour(s)).   Studies: Dg Chest 2 View  04/15/2013   *RADIOLOGY REPORT*  Clinical Data: New onset renal failure, history of hypertension, diabetes  CHEST - 2 VIEW  Comparison: None.  Findings: Normal cardiac silhouette and mediastinal contours.  No focal airspace opacities.  No pleural effusion or pneumothorax. Cardiac monitoring lead pads overlie the bilateral upper lungs.  No evidence of edema.  No acute osseous abnormalities.  IMPRESSION: No acute cardiopulmonary disease.   Original Report Authenticated By: Tacey Ruiz, MD   US Renal  04/15/2013   *RADIOLOGY REPORT*  Clinical Data: Acute renal insufficiency  RENAL/URINARY TRACT ULTRASOUND COMPLETE  Comparison:  None.  Findings:  Right Kidney:  11.3 cm in length.  Increased cortical echogenicity. No hydronephrosis or mass.  Left Kidney:  9.6 cm in length.  Increased cortical echogenicity. No hydronephrosis or solid mass.  2.2 cm simple cyst in the upper pole of the left kidney.  Bladder:  Unremarkable.  IMPRESSION: No hydronephrosis.  Increased cortical echogenicity compatible with medical renal parenchymal disease.   Original Report Authenticated By: Jolaine Click, M.D.    Scheduled Meds: . cloNIDine  0.2 mg Oral BID  . glyBURIDE  5 mg Oral Q breakfast  . hydrALAZINE  50 mg Oral Q8H  . insulin aspart  0-5 Units Subcutaneous QHS  . insulin aspart  0-9 Units Subcutaneous TID WC  . labetalol  300 mg Oral TID  . linagliptin  5 mg Oral Daily  . sodium bicarbonate  650 mg Oral TID AC & HS   Continuous Infusions: . sodium chloride Stopped (04/16/13 0200)    Active Problems:   Anemia   Acute renal failure   HTN (hypertension)   DM2 (diabetes mellitus, type 2)    Time spent: 40 minutes   [facility]  [facility] Pager  [phone]. If 8PM-8AM, please contact night-coverage at [email or web address], password [facility] 04/16/2013, 11:06 AM  LOS: 2 days

## 2013-04-16 NOTE — Progress Notes (Signed)
Subjective: no complaints, eating lunch   Recent Labs Lab 04/14/13 2057 04/16/13 1137  WBC 5.4 5.3  NEUTROABS 3.4  --   HGB 7.7* 7.7*  HCT 22.2* 21.4*  MCV 86.0 83.6  PLT 214 193    Recent Labs Lab 04/14/13 2057 04/15/13 2300 04/16/13 0510  NA 134* 133* 135  K 5.3* 4.7 4.7  CL 104 106 109  CO2 17* 16* 15*  GLUCOSE 114* 140* 107*  BUN 62* 53* 52*  CREATININE 4.20* 3.84* 3.93*  CALCIUM 8.8 8.0* 8.4  PHOS  --  4.5  --    Physical Exam:  Blood pressure 178/71, pulse 80, temperature 98.8 F (37.1 C), temperature source Oral, resp. rate 18, height 5\' 5"  (1.651 m), weight 71.6 kg (157 lb 13.6 oz), SpO2 96.00%. Gen: alert older adult female, looks younger than stated age, no distress, calm  Skin: no rash, cyanosis  HEENT: EOMI, sclera anicteric, throat clear, +left ant cervical nodes  Neck: + JVD, bilat bruits vs transmitted M  Chest: clear bilat no rales or wheezing, good air movement  CV: regular, no rub, +holosystolic blowing M at apex, +2/6 SEM at base > carotids, fem/popliteal/pedal pulses strong  Abdomen: soft, nontender, no HSM no ascites or masses  Ext: no LE edema, no joint effusion, atrophic skin chgs LE's, no gangrene/ulcers  Neuro: alert, Ox3, nonfocal   Impression/Rec:  1 Acute vs CKD- records obtained from pt's PCP office today show pt has stage IV, maybe stage V CKD; creat in Feb '14 was 3.5 and here is 3.93.  She states today that she has a kidney specialist, Dr Mikey Kirschner, in [facility] at [facility] office, and was last seen around February. No further workup recommended here, pt has advanced stage CKD, recommended access placement to patient asap, but she did not seem enthusiastic about that idea.  She will f/u with her nephrologist asap, discussed with Johna Sheriff, pt's son as well and questions answered. OK for d/c, have d/w primary MD.  Will sign off.       2 DM 54yrs  3 HTN 22 yrs  4 Heart murmur x 2   Vinson Moselle  MD Pager [phone]    Cell  [phone] 04/16/2013, 1:29 PM

## 2013-04-17 LAB — C4 COMPLEMENT: Complement C4, Body Fluid: 32 mg/dL (ref 10–40)

## 2013-04-17 LAB — FOLATE RBC: RBC Folate: 908 ng/mL — ABNORMAL HIGH (ref >=366)

## 2013-04-17 LAB — PARATHYROID HORMONE, INTACT (NO CA): PTH: 512.4 pg/mL — ABNORMAL HIGH (ref 14.0–72.0)

## 2013-04-17 LAB — MPO/PR-3 (ANCA) ANTIBODIES: Myeloperoxidase Abs: <1 AU/mL (ref <20)

## 2013-04-17 LAB — KAPPA/LAMBDA LIGHT CHAINS: Lambda free light chains: 10.1 mg/dL — ABNORMAL HIGH (ref 0.57–2.63)

## 2013-04-17 LAB — C3 COMPLEMENT: C3 Complement: 111 mg/dL (ref 90–180)

## 2013-04-17 LAB — VITAMIN D 25 HYDROXY (VIT D DEFICIENCY, FRACTURES): Vit D, 25-Hydroxy: 23 ng/mL — ABNORMAL LOW (ref 30–89)

## 2013-04-18 LAB — UIFE/LIGHT CHAINS/TP QN, 24-HR UR
Alpha 1, Urine: DETECTED — AB
Free Kappa Lt Chains,Ur: 4.1 mg/dL — ABNORMAL HIGH (ref 0.14–2.42)
Free Kappa/Lambda Ratio: 3.01 ratio (ref 2.04–10.37)
Free Lambda Lt Chains,Ur: 1.36 mg/dL — ABNORMAL HIGH (ref 0.02–0.67)
Total Protein, Urine: 80.4 mg/dL

## 2013-04-18 LAB — PROTEIN ELECTROPHORESIS, SERUM
Albumin ELP: 56.2 % (ref 55.8–66.1)
Beta Globulin: 7.3 % — ABNORMAL HIGH (ref 4.7–7.2)
Total Protein ELP: 4.5 g/dL — ABNORMAL LOW (ref 6.0–8.3)

## 2013-04-18 LAB — ANTI-NUCLEAR AB-TITER (ANA TITER): ANA Titer 1: 1:320 {titer} — ABNORMAL HIGH

## 2013-04-18 LAB — ANA: Anti Nuclear Antibody(ANA): POSITIVE — AB

## 2015-10-23 DIAGNOSIS — J189 Pneumonia, unspecified organism: Secondary | ICD-10-CM

## 2015-10-23 HISTORY — DX: Pneumonia, unspecified organism: J18.9

## 2015-12-12 ENCOUNTER — Inpatient Hospital Stay
Admission: AD | Admit: 2015-12-12 | Discharge: 2016-01-01 | Disposition: A | Discharge status: Skilled Nursing Facility | Payer: Self-pay | Source: Ambulatory Visit | Attending: Internal Medicine | Admitting: Internal Medicine

## 2015-12-12 DIAGNOSIS — R2240 Localized swelling, mass and lump, unspecified lower limb: Secondary | ICD-10-CM

## 2015-12-12 DIAGNOSIS — R222 Localized swelling, mass and lump, trunk: Secondary | ICD-10-CM

## 2015-12-12 DIAGNOSIS — J9600 Acute respiratory failure, unspecified whether with hypoxia or hypercapnia: Secondary | ICD-10-CM

## 2015-12-13 ENCOUNTER — Other Ambulatory Visit (HOSPITAL_COMMUNITY): Payer: Self-pay

## 2015-12-13 LAB — CBC
HEMATOCRIT: 28.1 % — AB (ref 36.0–46.0)
HEMOGLOBIN: 8.9 g/dL — AB (ref 12.0–15.0)
MCH: 29.1 pg (ref 26.0–34.0)
MCHC: 31.7 g/dL (ref 30.0–36.0)
MCV: 91.8 fL (ref 78.0–100.0)
Platelets: 179 10*3/uL (ref 150–400)
RBC: 3.06 MIL/uL — AB (ref 3.87–5.11)
RDW: 14.8 % (ref 11.5–15.5)
WBC: 10.3 10*3/uL (ref 4.0–10.5)

## 2015-12-13 LAB — COMPREHENSIVE METABOLIC PANEL
ALT: 14 U/L (ref 14–54)
AST: 24 U/L (ref 15–41)
Albumin: 2.1 g/dL — ABNORMAL LOW (ref 3.5–5.0)
Alkaline Phosphatase: 120 U/L (ref 38–126)
Anion gap: 14 (ref 5–15)
BUN: 20 mg/dL (ref 6–20)
CHLORIDE: 103 mmol/L (ref 101–111)
CO2: 22 mmol/L (ref 22–32)
CREATININE: 2.86 mg/dL — AB (ref 0.44–1.00)
Calcium: 7.8 mg/dL — ABNORMAL LOW (ref 8.9–10.3)
GFR, EST AFRICAN AMERICAN: 17 mL/min — AB (ref >60)
GFR, EST NON AFRICAN AMERICAN: 14 mL/min — AB (ref >60)
Glucose, Bld: 238 mg/dL — ABNORMAL HIGH (ref 65–99)
Potassium: 3.9 mmol/L (ref 3.5–5.1)
Sodium: 139 mmol/L (ref 135–145)
TOTAL PROTEIN: 6.1 g/dL — AB (ref 6.5–8.1)
Total Bilirubin: 0.8 mg/dL (ref 0.3–1.2)

## 2015-12-13 LAB — C DIFFICILE QUICK SCREEN W PCR REFLEX
C DIFFICILE (CDIFF) TOXIN: NEGATIVE
C DIFFICLE (CDIFF) ANTIGEN: NEGATIVE
C Diff interpretation: NEGATIVE

## 2015-12-13 NOTE — Consult Note (Signed)
Date: 12/13/2015                  Patient Name:  Angela Trujillo  MRN: [id number]  DOB: 01/24/1931  Age / Sex: 82 y.o., female         PCP: No primary care provider on file.                 Service Requesting Consult: Selected internal medicine                 Reason for Consult: ESRD            History of Present Illness: Patient is a 82 y.o. female with medical problems of end-stage renal disease, , who was admitted to [facility] on 12/12/2015 for management of respiratory failure which is thought to be secondary to pneumonia.  She normally dialyzes at [facility] dialysis unit in [facility]. She is followed by Dr. Ladona Mow. Her last dialysis treatment information was yesterday. She reports that she normally does pretty well during treatment.   She was dialyzed 3 days in a row. Yesterday, 4 L were removed Today she is doing well, sitting in the chair on room air sats 97%   Medications: Outpatient medications: Prescriptions prior to admission  Medication Sig Dispense Refill Last Dose  . cloNIDine (CATAPRES) 0.2 MG tablet Take 0.2 mg by mouth 2 (two) times daily.   Past Week at Unknown  . ferrous sulfate 325 (65 FE) MG tablet Take 325 mg by mouth daily with breakfast.   Past Week at Unknown  . glyBURIDE (DIABETA) 5 MG tablet Take 5 mg by mouth daily with breakfast.   Past Week at Unknown  . hydrALAZINE (APRESOLINE) 100 MG tablet Take 1 tablet (100 mg total) by mouth every 8 (eight) hours. 120 tablet 3   . labetalol (NORMODYNE) 200 MG tablet Take 200 mg by mouth 2 (two) times daily.   Past Week at Unknown  . saxagliptin HCl (ONGLYZA) 5 MG TABS tablet Take 5 mg by mouth daily.   Past Week at Unknown  . sodium bicarbonate 650 MG tablet Take 1 tablet (650 mg total) by mouth 4 (four) times daily. 120 tablet 2     Current medications: No current facility-administered medications for this encounter.      Allergies: Allergies  Allergen Reactions  . Asa [Aspirin]   . Codeine   .  Penicillins       Past Medical History: Past Medical History  Diagnosis Date  . Hypertension   . Diabetes mellitus without complication      Past Surgical History: Past Surgical History  Procedure Laterality Date  . Abdominal hysterectomy    . Tubal ligation       Family History: No family history on file.   Social History: Social History   Social History  . Marital Status: Widowed    Spouse Name: N/A  . Number of Children: N/A  . Years of Education: N/A   Occupational History  . Not on file.   Social History Main Topics  . Smoking status: Never Smoker   . Smokeless tobacco: Not on file  . Alcohol Use: No  . Drug Use: Not on file  . Sexual Activity: No   Other Topics Concern  . Not on file   Social History Narrative  . No narrative on file     Review of Systems: Gen: no weight changes, she did have fever prior to acute illness HEENT: no complaints CV: history  of aortic stenosis Resp: recent pneumonia, currently on room air [id number] is fair GU : makes little urine MS: no acute complaints Derm:  no acute complaints Psych:no complaints Heme: no complaint Neuro: no complaint Endocrine No c/o  Vital Signs: There were no vitals taken for this visit.   Temperature 90.7 pulse 86 respirations 20 blood pressure 136/72  No intake or output data in the 24 hours ending 12/13/15 0949  Weight trends: There were no vitals filed for this visit.  Physical Exam: General:  no acute distress, sitting in the chair  HEENT Anicteric, moist oral mucous membranes  Neck:  supple  Lungs: Right lung crackles, left clear  Heart:: Cresendo systolic murmur, regular, no rub  Abdomen: Soft, nontender  Extremities:  no peripheral edema  Neurologic: Alert, oriented, follows commands  Skin: Skin no acute rashes  Access: Right arm AV graft  Foley:        Lab results: Basic Metabolic Panel: No results for input(s): NA, K, CL, CO2, GLUCOSE, BUN, CREATININE,  CALCIUM, MG, PHOS in the last 168 hours.  Liver Function Tests: No results for input(s): AST, ALT, ALKPHOS, BILITOT, PROT, ALBUMIN in the last 168 hours. No results for input(s): LIPASE, AMYLASE in the last 168 hours. No results for input(s): AMMONIA in the last 168 hours.  CBC:  Recent Labs Lab 12/13/15 0850  WBC 10.3  HGB 8.9*  HCT 28.1*  MCV 91.8  PLT 179    Cardiac Enzymes: No results for input(s): CKTOTAL, TROPONINI in the last 168 hours.  BNP: Invalid input(s): POCBNP  CBG: No results for input(s): GLUCAP in the last 168 hours.  Microbiology: No results found for this or any previous visit (from the past 720 hour(s)).   Coagulation Studies: No results for input(s): LABPROT, INR in the last 72 hours.  Urinalysis: No results for input(s): COLORURINE, LABSPEC, PHURINE, GLUCOSEU, HGBUR, BILIRUBINUR, KETONESUR, PROTEINUR, UROBILINOGEN, NITRITE, LEUKOCYTESUR in the last 72 hours.  Invalid input(s): APPERANCEUR      Imaging: Dg Chest Port 1 View  12/13/2015  CLINICAL DATA:  Respiratory failure EXAM: PORTABLE CHEST 1 VIEW COMPARISON:  12/11/2015 FINDINGS: Bilateral diffuse airspace disease has improved. There is residual central basilar disease. Vascular congestion has improved. Borderline cardiomegaly. No pneumothorax. IMPRESSION: Improved bilateral airspace disease. Electronically Signed   By: Marybelle Killings M.D.   On: 12/13/2015 08:40      Assessment & Plan: Pt is a 82 y.o. yo female with a PMHX of end-stage renal disease, history of dysphagia and aspiration, diabetes type 2, anemia of chronic disease, aortic valve stenosis, was admitted on 12/12/2015 with pneumonia and volume overload.   1. End-stage renal disease. Normally dialyzes at [facility] dialysis unit. TTS  We will plan on her dialysis tomorrow giving her on the same schedule  2. Anemia of chronic kidney disease Hemoglobin of 8.9. We will start low-dose Aranesp  3. Secondary  hyperparathyroidism Monitor phosphorus during admission  4. Acute respiratory failure from pneumonia and volume overload - We will continue to optimize volume status with dialysis  We will continue to follow

## 2015-12-14 LAB — PROCALCITONIN: Procalcitonin: 1.07 ng/mL

## 2015-12-14 LAB — T4, FREE: Free T4: 1.15 ng/dL — ABNORMAL HIGH (ref 0.61–1.12)

## 2015-12-14 LAB — CBC
HCT: 25.9 % — ABNORMAL LOW (ref 36.0–46.0)
Hemoglobin: 8.5 g/dL — ABNORMAL LOW (ref 12.0–15.0)
MCH: 29.6 pg (ref 26.0–34.0)
MCHC: 32.8 g/dL (ref 30.0–36.0)
MCV: 90.2 fL (ref 78.0–100.0)
PLATELETS: 171 10*3/uL (ref 150–400)
RBC: 2.87 MIL/uL — AB (ref 3.87–5.11)
RDW: 15 % (ref 11.5–15.5)
WBC: 9.6 10*3/uL (ref 4.0–10.5)

## 2015-12-14 LAB — RENAL FUNCTION PANEL
Albumin: 2.1 g/dL — ABNORMAL LOW (ref 3.5–5.0)
Anion gap: 14 (ref 5–15)
BUN: 33 mg/dL — AB (ref 6–20)
CHLORIDE: 104 mmol/L (ref 101–111)
CO2: 21 mmol/L — AB (ref 22–32)
CREATININE: 4.06 mg/dL — AB (ref 0.44–1.00)
Calcium: 7.7 mg/dL — ABNORMAL LOW (ref 8.9–10.3)
GFR calc Af Amer: 11 mL/min — ABNORMAL LOW (ref >60)
GFR calc non Af Amer: 9 mL/min — ABNORMAL LOW (ref >60)
GLUCOSE: 92 mg/dL (ref 65–99)
Phosphorus: 2.7 mg/dL (ref 2.5–4.6)
Potassium: 4.3 mmol/L (ref 3.5–5.1)
Sodium: 139 mmol/L (ref 135–145)

## 2015-12-14 LAB — TSH: TSH: 1.491 u[IU]/mL (ref 0.350–4.500)

## 2015-12-15 LAB — HEPATITIS B SURFACE ANTIGEN: HEP B S AG: NEGATIVE

## 2015-12-15 LAB — HEPATITIS B CORE ANTIBODY, TOTAL: Hep B Core Total Ab: NEGATIVE

## 2015-12-15 LAB — PTH, INTACT AND CALCIUM
CALCIUM TOTAL (PTH): 7.4 mg/dL — AB (ref 8.7–10.3)
PTH: 46 pg/mL (ref 15–65)

## 2015-12-15 LAB — HEPATITIS B SURFACE ANTIBODY,QUALITATIVE: Hep B S Ab: REACTIVE

## 2015-12-16 NOTE — Progress Notes (Signed)
  Subjective:  Resting quietly Son at bedside   Objective:  Vital signs in last 24 hours:     Weight change:  There were no vitals filed for this visit.  Intake/Output:   No intake or output data in the 24 hours ending 12/16/15 1650   Physical Exam: General: NAD  HEENT Anicteric, moist mucus membranes  Neck supple  Pulm/lungs clear  CVS/Heart No rub, crescendo systolic murmur  Abdomen:  Soft, NT  Extremities: No edema  Neurologic: Alert, follows commands  Skin: No acute rashes  Access: Rt arm AVF       Basic Metabolic Panel:   Recent Labs Lab 12/13/15 0850 12/14/15 0536  NA 139 139  K 3.9 4.3  CL 103 104  CO2 22 21*  GLUCOSE 238* 92  BUN 20 33*  CREATININE 2.86* 4.06*  CALCIUM 7.8* 7.7*  7.4*  PHOS  --  2.7     CBC:  Recent Labs Lab 12/13/15 0850 12/14/15 0536  WBC 10.3 9.6  HGB 8.9* 8.5*  HCT 28.1* 25.9*  MCV 91.8 90.2  PLT 179 171      Microbiology:  Recent Results (from the past 720 hour(s))  C difficile quick scan w PCR reflex     Status: None   Collection Time: 12/13/15  3:19 PM  Result Value Ref Range Status   C Diff antigen NEGATIVE NEGATIVE Final   C Diff toxin NEGATIVE NEGATIVE Final   C Diff interpretation Negative for toxigenic C. difficile  Final    Coagulation Studies: No results for input(s): LABPROT, INR in the last 72 hours.  Urinalysis: No results for input(s): COLORURINE, LABSPEC, PHURINE, GLUCOSEU, HGBUR, BILIRUBINUR, KETONESUR, PROTEINUR, UROBILINOGEN, NITRITE, LEUKOCYTESUR in the last 72 hours.  Invalid input(s): APPERANCEUR    Imaging: No results found.   Medications:       Assessment/ Plan:  82 y.o. female  with a PMHX of end-stage renal disease, history of dysphagia and aspiration, diabetes type 2, anemia of chronic disease, aortic valve stenosis, was admitted on 12/12/2015 with pneumonia and volume overload.   1. End-stage renal disease. Normally dialyzes at [facility] dialysis unit. TTS We  will plan on her dialysis tomorrow giving her on the same schedule  2. Anemia of chronic kidney disease Hemoglobin of 8.5 low-dose Aranesp  3. Secondary hyperparathyroidism Monitor phosphorus during admission  4. Acute respiratory failure from pneumonia and volume overload - We will continue to optimize volume status with dialysis  We will continue to follow    LOS:  Angela Trujillo 3/27/20174:50 PM

## 2015-12-17 LAB — CBC
HCT: 24.3 % — ABNORMAL LOW (ref 36.0–46.0)
Hemoglobin: 8 g/dL — ABNORMAL LOW (ref 12.0–15.0)
MCH: 29.6 pg (ref 26.0–34.0)
MCHC: 32.9 g/dL (ref 30.0–36.0)
MCV: 90 fL (ref 78.0–100.0)
PLATELETS: 181 10*3/uL (ref 150–400)
RBC: 2.7 MIL/uL — AB (ref 3.87–5.11)
RDW: 15.6 % — AB (ref 11.5–15.5)
WBC: 7 10*3/uL (ref 4.0–10.5)

## 2015-12-17 LAB — RENAL FUNCTION PANEL
Albumin: 2.1 g/dL — ABNORMAL LOW (ref 3.5–5.0)
Anion gap: 11 (ref 5–15)
BUN: 45 mg/dL — ABNORMAL HIGH (ref 6–20)
CHLORIDE: 103 mmol/L (ref 101–111)
CO2: 23 mmol/L (ref 22–32)
CREATININE: 6.29 mg/dL — AB (ref 0.44–1.00)
Calcium: 7.9 mg/dL — ABNORMAL LOW (ref 8.9–10.3)
GFR calc Af Amer: 6 mL/min — ABNORMAL LOW (ref >60)
GFR calc non Af Amer: 6 mL/min — ABNORMAL LOW (ref >60)
GLUCOSE: 70 mg/dL (ref 65–99)
Phosphorus: 4.7 mg/dL — ABNORMAL HIGH (ref 2.5–4.6)
Potassium: 4.6 mmol/L (ref 3.5–5.1)
SODIUM: 137 mmol/L (ref 135–145)

## 2015-12-18 LAB — BLOOD GAS, ARTERIAL
Acid-Base Excess: 0.9 mmol/L (ref 0.0–2.0)
BICARBONATE: 24 meq/L (ref 20.0–24.0)
FIO2: 0.21
O2 SAT: 94.2 %
PATIENT TEMPERATURE: 98.6
PH ART: 7.487 — AB (ref 7.350–7.450)
TCO2: 25 mmol/L (ref 0–100)
pCO2 arterial: 32 mmHg — ABNORMAL LOW (ref 35.0–45.0)
pO2, Arterial: 71.4 mmHg — ABNORMAL LOW (ref 80.0–100.0)

## 2015-12-18 NOTE — Progress Notes (Signed)
  Subjective:  Resting quietly Last HS 3/28    Objective:  Vital signs in last 24 hours:   97.7  81  18  128/68  Weight change:  There were no vitals filed for this visit.  Intake/Output:   No intake or output data in the 24 hours ending 12/18/15 1656   Physical Exam: General: NAD  HEENT Anicteric, moist mucus membranes  Neck supple  Pulm/lungs clear  CVS/Heart No rub, crescendo systolic murmur  Abdomen:  Soft, NT  Extremities: No edema  Neurologic: Alert, follows commands  Skin: No acute rashes  Access: Rt arm AVF       Basic Metabolic Panel:   Recent Labs Lab 12/13/15 0850 12/14/15 0536 12/17/15 0710  NA 139 139 137  K 3.9 4.3 4.6  CL 103 104 103  CO2 22 21* 23  GLUCOSE 238* 92 70  BUN 20 33* 45*  CREATININE 2.86* 4.06* 6.29*  CALCIUM 7.8* 7.7*  7.4* 7.9*  PHOS  --  2.7 4.7*     CBC:  Recent Labs Lab 12/13/15 0850 12/14/15 0536 12/17/15 0711  WBC 10.3 9.6 7.0  HGB 8.9* 8.5* 8.0*  HCT 28.1* 25.9* 24.3*  MCV 91.8 90.2 90.0  PLT 179 171 181      Microbiology:  Recent Results (from the past 720 hour(s))  C difficile quick scan w PCR reflex     Status: None   Collection Time: 12/13/15  3:19 PM  Result Value Ref Range Status   C Diff antigen NEGATIVE NEGATIVE Final   C Diff toxin NEGATIVE NEGATIVE Final   C Diff interpretation Negative for toxigenic C. difficile  Final    Coagulation Studies: No results for input(s): LABPROT, INR in the last 72 hours.  Urinalysis: No results for input(s): COLORURINE, LABSPEC, PHURINE, GLUCOSEU, HGBUR, BILIRUBINUR, KETONESUR, PROTEINUR, UROBILINOGEN, NITRITE, LEUKOCYTESUR in the last 72 hours.  Invalid input(s): APPERANCEUR    Imaging: No results found.   Medications:       Assessment/ Plan:  82 y.o. female  with a PMHX of end-stage renal disease, history of dysphagia and aspiration, diabetes type 2, anemia of chronic disease, aortic valve stenosis, was admitted on 12/12/2015 with pneumonia  and volume overload.   1. End-stage renal disease. Normally dialyzes at [facility] dialysis unit. TTS We will plan on her dialysis TTS  2. Anemia of chronic kidney disease Hemoglobin of 8.0 low-dose Aranesp  3. Secondary hyperparathyroidism Monitor phosphorus during admission 4.7  4. Acute respiratory failure from pneumonia and volume overload - We will continue to optimize volume status with dialysis  We will continue to follow    LOS:  Angela Trujillo 3/29/20174:56 PM

## 2015-12-19 LAB — RENAL FUNCTION PANEL
Albumin: 2.1 g/dL — ABNORMAL LOW (ref 3.5–5.0)
Anion gap: 13 (ref 5–15)
BUN: 41 mg/dL — AB (ref 6–20)
CALCIUM: 7.8 mg/dL — AB (ref 8.9–10.3)
CHLORIDE: 101 mmol/L (ref 101–111)
CO2: 23 mmol/L (ref 22–32)
CREATININE: 6.18 mg/dL — AB (ref 0.44–1.00)
GFR calc Af Amer: 7 mL/min — ABNORMAL LOW (ref >60)
GFR, EST NON AFRICAN AMERICAN: 6 mL/min — AB (ref >60)
Glucose, Bld: 108 mg/dL — ABNORMAL HIGH (ref 65–99)
Phosphorus: 4.7 mg/dL — ABNORMAL HIGH (ref 2.5–4.6)
Potassium: 4.6 mmol/L (ref 3.5–5.1)
SODIUM: 137 mmol/L (ref 135–145)

## 2015-12-19 LAB — CBC
HCT: 22.4 % — ABNORMAL LOW (ref 36.0–46.0)
Hemoglobin: 7.3 g/dL — ABNORMAL LOW (ref 12.0–15.0)
MCH: 29.2 pg (ref 26.0–34.0)
MCHC: 32.6 g/dL (ref 30.0–36.0)
MCV: 89.6 fL (ref 78.0–100.0)
PLATELETS: 187 10*3/uL (ref 150–400)
RBC: 2.5 MIL/uL — AB (ref 3.87–5.11)
RDW: 15.7 % — AB (ref 11.5–15.5)
WBC: 5.6 10*3/uL (ref 4.0–10.5)

## 2015-12-20 NOTE — Progress Notes (Signed)
  Subjective:  Resting quietly Last HD was Thursday. Tolerated well   Objective:  Vital signs in last 24 hours:     Temperature 97.1, pulse 77, respirations 24, blood pressure 156/84  Weight change:  There were no vitals filed for this visit.  Intake/Output:   No intake or output data in the 24 hours ending 12/20/15 1536   Physical Exam: General: NAD  HEENT Anicteric, moist mucus membranes  Neck supple  Pulm/lungs clear  CVS/Heart No rub, crescendo systolic murmur  Abdomen:  Soft, NT  Extremities: No edema  Neurologic: Alert, follows commands  Skin: No acute rashes  Access: Rt arm AVF       Basic Metabolic Panel:   Recent Labs Lab 12/14/15 0536 12/17/15 0710 12/19/15 0752  NA 139 137 137  K 4.3 4.6 4.6  CL 104 103 101  CO2 21* 23 23  GLUCOSE 92 70 108*  BUN 33* 45* 41*  CREATININE 4.06* 6.29* 6.18*  CALCIUM 7.7*  7.4* 7.9* 7.8*  PHOS 2.7 4.7* 4.7*     CBC:  Recent Labs Lab 12/14/15 0536 12/17/15 0711 12/19/15 0751  WBC 9.6 7.0 5.6  HGB 8.5* 8.0* 7.3*  HCT 25.9* 24.3* 22.4*  MCV 90.2 90.0 89.6  PLT 171 181 187      Microbiology:  Recent Results (from the past 720 hour(s))  C difficile quick scan w PCR reflex     Status: None   Collection Time: 12/13/15  3:19 PM  Result Value Ref Range Status   C Diff antigen NEGATIVE NEGATIVE Final   C Diff toxin NEGATIVE NEGATIVE Final   C Diff interpretation Negative for toxigenic C. difficile  Final    Coagulation Studies: No results for input(s): LABPROT, INR in the last 72 hours.  Urinalysis: No results for input(s): COLORURINE, LABSPEC, PHURINE, GLUCOSEU, HGBUR, BILIRUBINUR, KETONESUR, PROTEINUR, UROBILINOGEN, NITRITE, LEUKOCYTESUR in the last 72 hours.  Invalid input(s): APPERANCEUR    Imaging: No results found.   Medications:       Assessment/ Plan:  82 y.o. female  with a PMHX of end-stage renal disease, history of dysphagia and aspiration, diabetes type 2, anemia of chronic  disease, aortic valve stenosis, was admitted on 12/12/2015 with pneumonia and volume overload.   1. End-stage renal disease. Normally dialyzes at [facility] dialysis unit. TTS We will plan on her dialysis TTS  2. Anemia of chronic kidney disease Hemoglobin of 7.3 low-dose Aranesp  3. Secondary hyperparathyroidism Monitor phosphorus during admission 4.7  4. Acute respiratory failure from pneumonia and volume overload - We will continue to optimize volume status with dialysis - so far doing well - 2000 removed on Thursday  We will continue to follow    LOS:  Darrall Strey 3/31/20173:36 PM

## 2015-12-21 LAB — RENAL FUNCTION PANEL
Albumin: 2.2 g/dL — ABNORMAL LOW (ref 3.5–5.0)
Anion gap: 11 (ref 5–15)
BUN: 32 mg/dL — ABNORMAL HIGH (ref 6–20)
CALCIUM: 8 mg/dL — AB (ref 8.9–10.3)
CO2: 24 mmol/L (ref 22–32)
CREATININE: 5.66 mg/dL — AB (ref 0.44–1.00)
Chloride: 102 mmol/L (ref 101–111)
GFR, EST AFRICAN AMERICAN: 7 mL/min — AB (ref >60)
GFR, EST NON AFRICAN AMERICAN: 6 mL/min — AB (ref >60)
Glucose, Bld: 133 mg/dL — ABNORMAL HIGH (ref 65–99)
Phosphorus: 4.1 mg/dL (ref 2.5–4.6)
Potassium: 4.5 mmol/L (ref 3.5–5.1)
SODIUM: 137 mmol/L (ref 135–145)

## 2015-12-21 LAB — CBC
HCT: 25.1 % — ABNORMAL LOW (ref 36.0–46.0)
Hemoglobin: 8.1 g/dL — ABNORMAL LOW (ref 12.0–15.0)
MCH: 29.1 pg (ref 26.0–34.0)
MCHC: 32.3 g/dL (ref 30.0–36.0)
MCV: 90.3 fL (ref 78.0–100.0)
PLATELETS: 185 10*3/uL (ref 150–400)
RBC: 2.78 MIL/uL — AB (ref 3.87–5.11)
RDW: 15.9 % — ABNORMAL HIGH (ref 11.5–15.5)
WBC: 5.9 10*3/uL (ref 4.0–10.5)

## 2015-12-21 LAB — COMPREHENSIVE METABOLIC PANEL
ALT: 9 U/L — ABNORMAL LOW (ref 14–54)
ANION GAP: 10 (ref 5–15)
AST: 19 U/L (ref 15–41)
Albumin: 2.4 g/dL — ABNORMAL LOW (ref 3.5–5.0)
Alkaline Phosphatase: 103 U/L (ref 38–126)
BILIRUBIN TOTAL: 0.8 mg/dL (ref 0.3–1.2)
BUN: <5 mg/dL — ABNORMAL LOW (ref 6–20)
CO2: 27 mmol/L (ref 22–32)
Calcium: 8.3 mg/dL — ABNORMAL LOW (ref 8.9–10.3)
Chloride: 97 mmol/L — ABNORMAL LOW (ref 101–111)
Creatinine, Ser: 1.7 mg/dL — ABNORMAL HIGH (ref 0.44–1.00)
GFR, EST AFRICAN AMERICAN: 31 mL/min — AB (ref >60)
GFR, EST NON AFRICAN AMERICAN: 27 mL/min — AB (ref >60)
Glucose, Bld: 113 mg/dL — ABNORMAL HIGH (ref 65–99)
POTASSIUM: 3.5 mmol/L (ref 3.5–5.1)
Sodium: 134 mmol/L — ABNORMAL LOW (ref 135–145)
TOTAL PROTEIN: 7 g/dL (ref 6.5–8.1)

## 2015-12-21 LAB — PHOSPHORUS: Phosphorus: 1.3 mg/dL — ABNORMAL LOW (ref 2.5–4.6)

## 2015-12-21 LAB — MAGNESIUM: MAGNESIUM: 1.8 mg/dL (ref 1.7–2.4)

## 2015-12-23 NOTE — Progress Notes (Signed)
  Subjective:  Patient resting comfortably in bed. She had hemodialysis on Saturday. Her son is at the bedside as well.    Objective:  Vital signs in last 24 hours:  Temperature 97.1 pulse 79 respirations 15 blood pressure 128/67  Weight change:  There were no vitals filed for this visit.  Intake/Output:   No intake or output data in the 24 hours ending 12/23/15 1606   Physical Exam: General: NAD  HEENT Anicteric, moist mucus membranes  Neck supple  Pulm/lungs clear  CVS/Heart No rub, 3/6 systolic ejection murmur  Abdomen:  Soft, NTND  Extremities: No edema  Neurologic: Alert, follows commands  Skin: No acute rashes  Access: Rt arm AVF       Basic Metabolic Panel:   Recent Labs Lab 12/17/15 0710 12/19/15 0752 12/21/15 0539 12/21/15 1915  NA 137 137 137 134*  K 4.6 4.6 4.5 3.5  CL 103 101 102 97*  CO2 23 23 24 27   GLUCOSE 70 108* 133* 113*  BUN 45* 41* 32* <5*  CREATININE 6.29* 6.18* 5.66* 1.70*  CALCIUM 7.9* 7.8* 8.0* 8.3*  MG  --   --   --  1.8  PHOS 4.7* 4.7* 4.1 1.3*     CBC:  Recent Labs Lab 12/17/15 0711 12/19/15 0751 12/21/15 0539  WBC 7.0 5.6 5.9  HGB 8.0* 7.3* 8.1*  HCT 24.3* 22.4* 25.1*  MCV 90.0 89.6 90.3  PLT 181 187 185      Microbiology:  Recent Results (from the past 720 hour(s))  C difficile quick scan w PCR reflex     Status: None   Collection Time: 12/13/15  3:19 PM  Result Value Ref Range Status   C Diff antigen NEGATIVE NEGATIVE Final   C Diff toxin NEGATIVE NEGATIVE Final   C Diff interpretation Negative for toxigenic C. difficile  Final    Coagulation Studies: No results for input(s): LABPROT, INR in the last 72 hours.  Urinalysis: No results for input(s): COLORURINE, LABSPEC, PHURINE, GLUCOSEU, HGBUR, BILIRUBINUR, KETONESUR, PROTEINUR, UROBILINOGEN, NITRITE, LEUKOCYTESUR in the last 72 hours.  Invalid input(s): APPERANCEUR    Imaging: No results found.   Medications:       Assessment/ Plan:  82  y.o. female  with a PMHX of end-stage renal disease, history of dysphagia and aspiration, diabetes type 2, anemia of chronic disease, aortic valve stenosis, was admitted on 12/12/2015 with pneumonia and volume overload.   1. End-stage renal disease. Normally dialyzes at [facility] dialysis unit. TTS - no urgent indication for dialysis today. We will plan for dialysis again tomorrow. Ultrafiltration target 2 kg.  2. Anemia of chronic kidney disease Hemoglobin 8.1. Continue Aranesp 40 mcg weekly  3. Secondary hyperparathyroidism Recheck phosphorus tomorrow. Most recent phosphorus was low 1.3 however this was likely immediately after dialysis treatment.  4. Acute respiratory failure from pneumonia and volume overload - doing well from a respiratory status at the moment. Ultrafiltration target 2 kg as above tomorrow.    LOS:  Rondia Higginbotham 4/3/20174:06 PM

## 2015-12-24 LAB — RENAL FUNCTION PANEL
ALBUMIN: 2.2 g/dL — AB (ref 3.5–5.0)
ANION GAP: 12 (ref 5–15)
BUN: 36 mg/dL — ABNORMAL HIGH (ref 6–20)
CALCIUM: 8.3 mg/dL — AB (ref 8.9–10.3)
CO2: 25 mmol/L (ref 22–32)
Chloride: 101 mmol/L (ref 101–111)
Creatinine, Ser: 6.18 mg/dL — ABNORMAL HIGH (ref 0.44–1.00)
GFR calc Af Amer: 7 mL/min — ABNORMAL LOW (ref >60)
GFR, EST NON AFRICAN AMERICAN: 6 mL/min — AB (ref >60)
GLUCOSE: 78 mg/dL (ref 65–99)
PHOSPHORUS: 3.8 mg/dL (ref 2.5–4.6)
POTASSIUM: 4.7 mmol/L (ref 3.5–5.1)
SODIUM: 138 mmol/L (ref 135–145)

## 2015-12-24 LAB — CBC WITH DIFFERENTIAL/PLATELET
BASOS ABS: 0.1 10*3/uL (ref 0.0–0.1)
BASOS PCT: 1 %
Eosinophils Absolute: 0.3 10*3/uL (ref 0.0–0.7)
Eosinophils Relative: 6 %
HEMATOCRIT: 22.4 % — AB (ref 36.0–46.0)
HEMOGLOBIN: 7.3 g/dL — AB (ref 12.0–15.0)
LYMPHS PCT: 24 %
Lymphs Abs: 1.1 10*3/uL (ref 0.7–4.0)
MCH: 29.3 pg (ref 26.0–34.0)
MCHC: 32.6 g/dL (ref 30.0–36.0)
MCV: 90 fL (ref 78.0–100.0)
Monocytes Absolute: 0.2 10*3/uL (ref 0.1–1.0)
Monocytes Relative: 4 %
NEUTROS ABS: 2.8 10*3/uL (ref 1.7–7.7)
NEUTROS PCT: 65 %
Platelets: 159 10*3/uL (ref 150–400)
RBC: 2.49 MIL/uL — AB (ref 3.87–5.11)
RDW: 15.9 % — ABNORMAL HIGH (ref 11.5–15.5)
WBC: 4.4 10*3/uL (ref 4.0–10.5)

## 2015-12-24 LAB — MAGNESIUM: Magnesium: 2.1 mg/dL (ref 1.7–2.4)

## 2015-12-25 LAB — CBC
HEMATOCRIT: 25.6 % — AB (ref 36.0–46.0)
HEMOGLOBIN: 8.5 g/dL — AB (ref 12.0–15.0)
MCH: 30.4 pg (ref 26.0–34.0)
MCHC: 33.2 g/dL (ref 30.0–36.0)
MCV: 91.4 fL (ref 78.0–100.0)
Platelets: 200 10*3/uL (ref 150–400)
RBC: 2.8 MIL/uL — ABNORMAL LOW (ref 3.87–5.11)
RDW: 16.1 % — ABNORMAL HIGH (ref 11.5–15.5)
WBC: 7.9 10*3/uL (ref 4.0–10.5)

## 2015-12-25 NOTE — Progress Notes (Signed)
  Subjective:  Patient completed hemodialysis yesterday. She tolerated this well per her report. Resting comfortably at the moment.    Objective:  Vital signs in last 24 hours:  Journey pulse 91 respirations 19 blood pressure 131/65 pulse was 95%   Physical Exam: General: NAD  HEENT Anicteric, moist mucus membranes  Neck supple  Pulm/lungs clear  CVS/Heart No rub, 3/6 systolic ejection murmur  Abdomen:  Soft, NTND  Extremities: No edema  Neurologic: Alert, follows commands  Skin: No acute rashes  Access: Rt arm AVF       Basic Metabolic Panel:   Recent Labs Lab 12/19/15 0752 12/21/15 0539 12/21/15 1915 12/24/15 0818  NA 137 137 134* 138  K 4.6 4.5 3.5 4.7  CL 101 102 97* 101  CO2 23 24 27 25   GLUCOSE 108* 133* 113* 78  BUN 41* 32* <5* 36*  CREATININE 6.18* 5.66* 1.70* 6.18*  CALCIUM 7.8* 8.0* 8.3* 8.3*  MG  --   --  1.8 2.1  PHOS 4.7* 4.1 1.3* 3.8     CBC:  Recent Labs Lab 12/19/15 0751 12/21/15 0539 12/24/15 0818  WBC 5.6 5.9 4.4  NEUTROABS  --   --  2.8  HGB 7.3* 8.1* 7.3*  HCT 22.4* 25.1* 22.4*  MCV 89.6 90.3 90.0  PLT 187 185 159      Microbiology:  Recent Results (from the past 720 hour(s))  C difficile quick scan w PCR reflex     Status: None   Collection Time: 12/13/15  3:19 PM  Result Value Ref Range Status   C Diff antigen NEGATIVE NEGATIVE Final   C Diff toxin NEGATIVE NEGATIVE Final   C Diff interpretation Negative for toxigenic C. difficile  Final    Coagulation Studies: No results for input(s): LABPROT, INR in the last 72 hours.  Urinalysis: No results for input(s): COLORURINE, LABSPEC, PHURINE, GLUCOSEU, HGBUR, BILIRUBINUR, KETONESUR, PROTEINUR, UROBILINOGEN, NITRITE, LEUKOCYTESUR in the last 72 hours.  Invalid input(s): APPERANCEUR    Imaging: No results found.   Medications:       Assessment/ Plan:  82 y.o. female  with a PMHX of end-stage renal disease, history of dysphagia and aspiration, diabetes type  2, anemia of chronic disease, aortic valve stenosis, was admitted on 12/12/2015 with pneumonia and volume overload.   1. End-stage renal disease. Normally dialyzes at [facility] dialysis unit. TTS - patient due for hemodialysis again tomorrow. We'll prepare her orders. Ultrafiltration target 1.5 kg.  2. Anemia of chronic kidney disease Hemoglobin down to 7.3. Continue Aranesp 40 mcg weekly.  3. Secondary hyperparathyroidism Phosphorus 3.8 an acceptable yesterday. Continue to monitor.  4. Acute respiratory failure from pneumonia and volume overload - Continues to do well from a respiratory perspective. Continue ultrafiltration with dialysis. Target ultrafiltration 1.5 kg tomorrow as above.    LOS:  Morningstar Toft 4/5/201710:59 AM

## 2015-12-26 LAB — CBC
HEMATOCRIT: 24 % — AB (ref 36.0–46.0)
HEMOGLOBIN: 8 g/dL — AB (ref 12.0–15.0)
MCH: 30.5 pg (ref 26.0–34.0)
MCHC: 33.3 g/dL (ref 30.0–36.0)
MCV: 91.6 fL (ref 78.0–100.0)
Platelets: 170 10*3/uL (ref 150–400)
RBC: 2.62 MIL/uL — AB (ref 3.87–5.11)
RDW: 16.4 % — ABNORMAL HIGH (ref 11.5–15.5)
WBC: 5.3 10*3/uL (ref 4.0–10.5)

## 2015-12-26 LAB — RENAL FUNCTION PANEL
ANION GAP: 12 (ref 5–15)
Albumin: 2.3 g/dL — ABNORMAL LOW (ref 3.5–5.0)
BUN: 32 mg/dL — ABNORMAL HIGH (ref 6–20)
CHLORIDE: 101 mmol/L (ref 101–111)
CO2: 25 mmol/L (ref 22–32)
Calcium: 8.4 mg/dL — ABNORMAL LOW (ref 8.9–10.3)
Creatinine, Ser: 5.16 mg/dL — ABNORMAL HIGH (ref 0.44–1.00)
GFR calc non Af Amer: 7 mL/min — ABNORMAL LOW (ref >60)
GFR, EST AFRICAN AMERICAN: 8 mL/min — AB (ref >60)
Glucose, Bld: 191 mg/dL — ABNORMAL HIGH (ref 65–99)
Phosphorus: 2.9 mg/dL (ref 2.5–4.6)
Potassium: 4.9 mmol/L (ref 3.5–5.1)
Sodium: 138 mmol/L (ref 135–145)

## 2015-12-27 NOTE — Progress Notes (Signed)
  Subjective:  The patient had hemodialysis yesterday. She tolerated this well. No acute issues at the moment.    Objective:  Vital signs in last 24 hours:  Temperature 96.5 pulse 83 respirations 37 blood pressure 127/68   Physical Exam: General: NAD  HEENT Anicteric, moist mucus membranes  Neck supple  Pulm/lungs clear  CVS/Heart No rub, 3/6 systolic ejection murmur  Abdomen:  Soft, NTND  Extremities: No edema  Neurologic: Alert, follows commands  Skin: No acute rashes  Access: Rt arm AVF       Basic Metabolic Panel:   Recent Labs Lab 12/21/15 0539 12/21/15 1915 12/24/15 0818 12/26/15 1347  NA 137 134* 138 138  K 4.5 3.5 4.7 4.9  CL 102 97* 101 101  CO2 24 27 25 25   GLUCOSE 133* 113* 78 191*  BUN 32* <5* 36* 32*  CREATININE 5.66* 1.70* 6.18* 5.16*  CALCIUM 8.0* 8.3* 8.3* 8.4*  MG  --  1.8 2.1  --   PHOS 4.1 1.3* 3.8 2.9     CBC:  Recent Labs Lab 12/21/15 0539 12/24/15 0818 12/25/15 1309 12/26/15 1347  WBC 5.9 4.4 7.9 5.3  NEUTROABS  --  2.8  --   --   HGB 8.1* 7.3* 8.5* 8.0*  HCT 25.1* 22.4* 25.6* 24.0*  MCV 90.3 90.0 91.4 91.6  PLT 185 159 200 170      Microbiology:  Recent Results (from the past 720 hour(s))  C difficile quick scan w PCR reflex     Status: None   Collection Time: 12/13/15  3:19 PM  Result Value Ref Range Status   C Diff antigen NEGATIVE NEGATIVE Final   C Diff toxin NEGATIVE NEGATIVE Final   C Diff interpretation Negative for toxigenic C. difficile  Final    Coagulation Studies: No results for input(s): LABPROT, INR in the last 72 hours.  Urinalysis: No results for input(s): COLORURINE, LABSPEC, PHURINE, GLUCOSEU, HGBUR, BILIRUBINUR, KETONESUR, PROTEINUR, UROBILINOGEN, NITRITE, LEUKOCYTESUR in the last 72 hours.  Invalid input(s): APPERANCEUR    Imaging: No results found.   Medications:       Assessment/ Plan:  82 y.o. female  with a PMHX of end-stage renal disease, history of dysphagia and  aspiration, diabetes type 2, anemia of chronic disease, aortic valve stenosis, was admitted on 12/12/2015 with pneumonia and volume overload.   1. End-stage renal disease. Normally dialyzes at [facility] dialysis unit. TTS - patient completed dialysis yesterday. No acute indication for dialysis today. We will plan for dialysis again tomorrow.  2. Anemia of chronic kidney disease Hemoglobin currently 8.0. We will continue the patient on Aranesp 40 mcg subcutaneous weekly.  3. Secondary hyperparathyroidism -phosphorus 2.9 at last check. Continue to monitor.  4. Acute respiratory failure from pneumonia and volume overload - patient has done quite well with conservative therapy. Continue ultrafiltration with dialysis to optimize respiratory status.    LOS:  Everline Mahaffy 4/7/20178:32 AM

## 2015-12-28 LAB — RENAL FUNCTION PANEL
ALBUMIN: 2.3 g/dL — AB (ref 3.5–5.0)
Anion gap: 10 (ref 5–15)
BUN: 31 mg/dL — AB (ref 6–20)
CO2: 25 mmol/L (ref 22–32)
CREATININE: 5.73 mg/dL — AB (ref 0.44–1.00)
Calcium: 8.5 mg/dL — ABNORMAL LOW (ref 8.9–10.3)
Chloride: 102 mmol/L (ref 101–111)
GFR calc Af Amer: 7 mL/min — ABNORMAL LOW (ref >60)
GFR, EST NON AFRICAN AMERICAN: 6 mL/min — AB (ref >60)
Glucose, Bld: 181 mg/dL — ABNORMAL HIGH (ref 65–99)
PHOSPHORUS: 3.2 mg/dL (ref 2.5–4.6)
Potassium: 4.8 mmol/L (ref 3.5–5.1)
SODIUM: 137 mmol/L (ref 135–145)

## 2015-12-28 LAB — CBC
HCT: 24.2 % — ABNORMAL LOW (ref 36.0–46.0)
Hemoglobin: 7.6 g/dL — ABNORMAL LOW (ref 12.0–15.0)
MCH: 28.9 pg (ref 26.0–34.0)
MCHC: 31.4 g/dL (ref 30.0–36.0)
MCV: 92 fL (ref 78.0–100.0)
Platelets: 176 10*3/uL (ref 150–400)
RBC: 2.63 MIL/uL — ABNORMAL LOW (ref 3.87–5.11)
RDW: 16.9 % — AB (ref 11.5–15.5)
WBC: 7.7 10*3/uL (ref 4.0–10.5)

## 2015-12-30 NOTE — Progress Notes (Signed)
  Subjective:  Son at bedside. Hemodialysis Saturday, tolerated treatment well.   Objective:  Vital signs in last 24 hours:  Temperature 96 pulse 77 respirations 16 blood pressure 122/66   Physical Exam: General: NAD, laying in bed  HEENT Anicteric, moist mucus membranes  Neck supple  Pulm/lungs clear  CVS/Heart No rub, 3/6 systolic ejection murmur  Abdomen:  Soft, NTND  Extremities: No edema  Neurologic: Alert, follows commands  Skin: No acute rashes  Access: Rt arm AVF       Basic Metabolic Panel:   Recent Labs Lab 12/24/15 0818 12/26/15 1347 12/28/15 0930  NA 138 138 137  K 4.7 4.9 4.8  CL 101 101 102  CO2 25 25 25   GLUCOSE 78 191* 181*  BUN 36* 32* 31*  CREATININE 6.18* 5.16* 5.73*  CALCIUM 8.3* 8.4* 8.5*  MG 2.1  --   --   PHOS 3.8 2.9 3.2     CBC:  Recent Labs Lab 12/24/15 0818 12/25/15 1309 12/26/15 1347 12/28/15 0930  WBC 4.4 7.9 5.3 7.7  NEUTROABS 2.8  --   --   --   HGB 7.3* 8.5* 8.0* 7.6*  HCT 22.4* 25.6* 24.0* 24.2*  MCV 90.0 91.4 91.6 92.0  PLT 159 200 170 176      Microbiology:  Recent Results (from the past 720 hour(s))  C difficile quick scan w PCR reflex     Status: None   Collection Time: 12/13/15  3:19 PM  Result Value Ref Range Status   C Diff antigen NEGATIVE NEGATIVE Final   C Diff toxin NEGATIVE NEGATIVE Final   C Diff interpretation Negative for toxigenic C. difficile  Final    Coagulation Studies: No results for input(s): LABPROT, INR in the last 72 hours.  Urinalysis: No results for input(s): COLORURINE, LABSPEC, PHURINE, GLUCOSEU, HGBUR, BILIRUBINUR, KETONESUR, PROTEINUR, UROBILINOGEN, NITRITE, LEUKOCYTESUR in the last 72 hours.  Invalid input(s): APPERANCEUR    Imaging: No results found.   Medications:   Aranesp 77mcg q1 week    Assessment/ Plan:  82 y.o. female  with a PMHX of end-stage renal disease, history of dysphagia and aspiration, diabetes type 2, anemia of chronic disease, aortic valve  stenosis, was admitted on 12/12/2015 with pneumonia and volume overload.   1. End-stage renal disease. Normally dialyzes at [facility] dialysis unit. TTS - Continue TTS schedule, orders prepared for Tuesday dialysis.   2. Anemia of chronic kidney disease: hemoglobin 7.6 - Aranesp 40 mcg subcutaneous weekly.  3. Secondary hyperparathyroidism: PTH 46  - Sevelamer with meals.   4. Hypertension: blood pressure at goal.  - labetalol and furosemide.   Point, Lurena Nida 4/10/20174:11 PM

## 2015-12-31 ENCOUNTER — Other Ambulatory Visit (HOSPITAL_COMMUNITY): Payer: Self-pay

## 2015-12-31 LAB — RENAL FUNCTION PANEL
ANION GAP: 14 (ref 5–15)
Albumin: 2.5 g/dL — ABNORMAL LOW (ref 3.5–5.0)
BUN: 28 mg/dL — ABNORMAL HIGH (ref 6–20)
CALCIUM: 8.6 mg/dL — AB (ref 8.9–10.3)
CHLORIDE: 101 mmol/L (ref 101–111)
CO2: 24 mmol/L (ref 22–32)
Creatinine, Ser: 6.86 mg/dL — ABNORMAL HIGH (ref 0.44–1.00)
GFR calc non Af Amer: 5 mL/min — ABNORMAL LOW (ref >60)
GFR, EST AFRICAN AMERICAN: 6 mL/min — AB (ref >60)
Glucose, Bld: 94 mg/dL (ref 65–99)
Phosphorus: 4.1 mg/dL (ref 2.5–4.6)
Potassium: 4.9 mmol/L (ref 3.5–5.1)
Sodium: 139 mmol/L (ref 135–145)

## 2015-12-31 LAB — CBC
HCT: 23.7 % — ABNORMAL LOW (ref 36.0–46.0)
HEMOGLOBIN: 7.7 g/dL — AB (ref 12.0–15.0)
MCH: 30 pg (ref 26.0–34.0)
MCHC: 32.5 g/dL (ref 30.0–36.0)
MCV: 92.2 fL (ref 78.0–100.0)
Platelets: 148 10*3/uL — ABNORMAL LOW (ref 150–400)
RBC: 2.57 MIL/uL — AB (ref 3.87–5.11)
RDW: 17 % — ABNORMAL HIGH (ref 11.5–15.5)
WBC: 7.2 10*3/uL (ref 4.0–10.5)

## 2016-07-14 ENCOUNTER — Telehealth: Payer: Self-pay

## 2016-07-14 NOTE — Telephone Encounter (Signed)
F/u Message  Pt son returning Rn call about information for pt. Please call back to discuss

## 2016-07-14 NOTE — Telephone Encounter (Signed)
I spoke with the pt's son and he states that the pt's Primary Care MD is sending over a packet of records for review.  They should have been placed in the mail today.  I advised him that I will contact him once these are obtained and reviewed and then we can arrange an appointment. He agreed with plan.

## 2016-07-23 NOTE — Telephone Encounter (Signed)
I spoke with the pt's son and made him aware that at this time I have not received the pt's records for review. I provided him with our office fax number and he will contact the referring provider to see if they will fax the pt's records.

## 2016-07-27 NOTE — Telephone Encounter (Signed)
Records received and will forward to Dr Burt Knack for review.

## 2016-07-28 NOTE — Telephone Encounter (Signed)
I contacted the pt's son and arranged appointment with Dr Burt Knack on 08/07/16. Pt's son was very appreciative of call.

## 2016-08-07 ENCOUNTER — Ambulatory Visit (INDEPENDENT_AMBULATORY_CARE_PROVIDER_SITE_OTHER): Payer: Medicare Other | Admitting: Cardiovascular Disease

## 2016-08-07 ENCOUNTER — Encounter: Payer: Self-pay | Admitting: Cardiovascular Disease

## 2016-08-07 VITALS — BP 146/86 | HR 72 | Ht 65.0 in | Wt 144.8 lb

## 2016-08-07 DIAGNOSIS — I4892 Unspecified atrial flutter: Secondary | ICD-10-CM | POA: Diagnosis not present

## 2016-08-07 DIAGNOSIS — I350 Nonrheumatic aortic (valve) stenosis: Secondary | ICD-10-CM

## 2016-08-07 NOTE — Patient Instructions (Addendum)
Medication Instructions:  Your physician recommends that you continue on your current medications as directed. Please refer to the Current Medication list given to you today.  Labwork: No new orders.   Testing/Procedures: Your physician has requested that you have a cardiac catheterization. Cardiac catheterization is used to diagnose and/or treat various heart conditions. Doctors may recommend this procedure for a number of different reasons. The most common reason is to evaluate chest pain. Chest pain can be a symptom of coronary artery disease (CAD), and cardiac catheterization can show whether plaque is narrowing or blocking your heart's arteries. This procedure is also used to evaluate the valves, as well as measure the blood flow and oxygen levels in different parts of your heart. For further information please visit [facility]. Please follow instruction sheet, as given.  Your physician has requested that you have an echocardiogram on the day of cardiac catheterization. Echocardiography is a painless test that uses sound waves to create images of your heart. It provides your doctor with information about the size and shape of your heart and how well your heart's chambers and valves are working. This procedure takes approximately one hour. There are no restrictions for this procedure.  Non-Cardiac CT Angiography (CTA), is a special type of CT scan that uses a computer to produce multi-dimensional views of major blood vessels throughout the body. In CT angiography, a contrast material is injected through an IV to help visualize the blood vessels  Your physician has requested that you have cardiac CT. Cardiac computed tomography (CT) is a painless test that uses an x-ray machine to take clear, detailed pictures of your heart. For further information please visit [email or web address]. Please follow instruction sheet as given.  Follow-Up: We will arrange further follow-up with [facility] after  testing. Levonne Spiller RN is the TAVR coordinator and she will contact you with appointment.    Any Other Special Instructions Will Be Listed Below (If Applicable).     If you need a refill on your cardiac medications before your next appointment, please call your pharmacy.  08/26/16 Instructions:  Please arrive in admitting at [facility] at 9:15, tests will begin around 9:30 AM 1. No solid foods, caffeine or smoking 4 hours prior to testing 2. No herbal supplements by mouth 24 hours prior to test 3. Do not take any diabetic medications the morning of testing

## 2016-08-07 NOTE — Progress Notes (Signed)
Cardiology Office Note Date:  08/07/2016   ID:  Angela Trujillo, DOB 1930-12-04, MRN [id number]  PCP:  No primary care provider on file.  Cardiologist:  Sherren Mocha, MD    Chief Complaint  Patient presents with  . Aortic Stenosis     History of Present Illness: Angela Trujillo is a 82 y.o. female who presents for Evaluation of severe aortic stenosis.  She has had a longstanding heart murmur but otherwise has had no cardiac problems in the past. She has never undergone cardiac catheterization or other invasive cardiac evaluation in the past.   The patient was sick with pneumonia in February 2017 and was hospitalized at [facility] She then was transferred to [facility] for approximately one month. Has been back home since May 5th. Her sons stay with her every night to keep an eye on her. She has been very sedentary since her illness earlier this year. She is essentially in a wheelchair when she's out of the house. Does very little walking.   The patient developed ESRD and has been on hemodialysis for the past 3 years. She dialyzes Tues, [place], Sat.  An echocardiogram was performed 07/03/2016. This demonstrated low normal LV function with an ejection fraction of 50-55%, moderate concentric LVH, mild to moderate mitral regurgitation, moderate tricuspid regurgitation with moderate to severe pulmonary hypertension, and severe aortic stenosis with peak and mean transaortic gradient of 79 and 51 mmHg, respectively. The aortic valve area calculated to 0.4 cm.  At times she has hypotension during dialysis and treatment has to be interrupted. When this occurs she complains of nausea but she has not had chest pain, chest pressure, lightheadedness, or syncope. She does admit to shortness of breath with low-level activity, which has occurred ever since she was hospitalized this year.   Other than an AV-fistula, she hasn't had surgery since undergoing a hysterectomy 50 years  ago. She has 5 children, and has been widowed 20 years.    Past Medical History:  Diagnosis Date  . Diabetes mellitus without complication   . Hypertension     Past Surgical History:  Procedure Laterality Date  . ABDOMINAL HYSTERECTOMY    . TUBAL LIGATION      Current Outpatient Prescriptions  Medication Sig Dispense Refill  . acetaminophen (TYLENOL) 325 MG tablet Take 650 mg by mouth every 4 (four) hours as needed.    . cloNIDine (CATAPRES) 0.2 MG tablet Take 0.2 mg by mouth 2 (two) times daily.    . ferrous sulfate 325 (65 FE) MG tablet Take 325 mg by mouth daily with breakfast.    . glyBURIDE (DIABETA) 5 MG tablet Take 5 mg by mouth daily with breakfast.    . hydrALAZINE (APRESOLINE) 100 MG tablet Take 1 tablet (100 mg total) by mouth every 8 (eight) hours. 120 tablet 3  . Insulin Detemir (LEVEMIR FLEXTOUCH) 100 UNIT/ML Pen Inject 14 Units into the skin 2 (two) times daily.    Marland Kitchen labetalol (NORMODYNE) 200 MG tablet Take 200 mg by mouth 2 (two) times daily.    . metoprolol succinate (TOPROL-XL) 100 MG 24 hr tablet Take 100 mg by mouth daily.    . saxagliptin HCl (ONGLYZA) 5 MG TABS tablet Take 5 mg by mouth daily.    . sodium bicarbonate 650 MG tablet Take 1 tablet (650 mg total) by mouth 4 (four) times daily. 120 tablet 2   No current facility-administered medications for this visit.     Allergies:   Diona Fanti [  aspirin]; Codeine; and Penicillins   Social History:  The patient  reports that she has never smoked. She does not have any smokeless tobacco history on file. She reports that she does not drink alcohol.   Family History:  The patient's  family history is not on file.    ROS:  Please see the history of present illness.  Otherwise, review of systems is positive for hearing loss, fatigue.  All other systems are reviewed and negative.    PHYSICAL EXAM: VS:  BP (!) 146/86   Pulse 72   Ht 5\' 5"  (1.651 m)   Wt 144 lb 12.8 oz (65.7 kg)   BMI 24.10 kg/m  , BMI Body mass  index is 24.1 kg/m. GEN: pleasant elderly woman, in no acute distress  HEENT: normal  Neck: no JVD, no masses. No carotid bruits Cardiac: RRR with 4/6 harsh systolic murmur at the RUSB with diminished A2                Respiratory:  clear to auscultation bilaterally, normal work of breathing GI: soft, nontender, nondistended, + BS MS: no deformity or atrophy  Ext: no pretibial edema Skin: warm and dry, no rash Neuro:  Strength and sensation are intact Psych: euthymic mood, full affect  EKG:  EKG is ordered today. The ekg ordered today shows Atrial flutter with 4:1 AV conduction, RBBB, LAFB, LVH with QRS widening  Recent Labs: 12/14/2015: TSH 1.491 12/21/2015: ALT 9 12/24/2015: Magnesium 2.1 12/31/2015: BUN 28; Creatinine, Ser 6.86; Hemoglobin 7.7; Platelets 148; Potassium 4.9; Sodium 139   Lipid Panel     Component Value Date/Time   CHOL 181 04/15/2013 1120   TRIG 91 04/15/2013 1120   HDL 62 04/15/2013 1120   CHOLHDL 2.9 04/15/2013 1120   VLDL 18 04/15/2013 1120   LDLCALC 101 (H) 04/15/2013 1120      Wt Readings from Last 3 Encounters:  08/07/16 144 lb 12.8 oz (65.7 kg)  04/15/13 157 lb 13.6 oz (71.6 kg)     Cardiac Studies Reviewed: 2D Echo report reviewed as above  ASSESSMENT AND PLAN: 1.  Severe, Stage D, aortic stenosis 2. Atrial flutter with 4:1 conduction 3. ESRD 4. Type II DM, insulin-requiring  Complex patient with multiple comorbid conditions and limited functional capacity, who clearly has severe symptomatic aortic stenosis with exertional dyspnea and hypotension intermittently during dialysis. She is here with 2 of her sons today. I have reviewed the natural history of aortic stenosis with the patient and her family members  who are present today. We have discussed the limitations of medical therapy and the poor prognosis associated with symptomatic aortic stenosis. We have reviewed potential treatment options, including palliative medical therapy, conventional  surgical aortic valve replacement, and transcatheter aortic valve replacement. We discussed treatment options in the context of this patient's specific comorbid medical conditions.   We discussed moving forward for evaluation of treatment options for her aortic stenosis, which clearly would be limited to TAVR. She would like to proceed with evaluation which will include right and left heart catheterization, CTA studies, and cardiac surgical evaluation. I have reviewed the risks, indications, and alternatives to cardiac catheterization, possible angioplasty, and stenting with the patient. Risks include but are not limited to bleeding, infection, vascular injury, stroke, myocardial infection, arrhythmia, kidney injury, radiation-related injury in the case of prolonged fluoroscopy use, emergency cardiac surgery, and death. The patient understands the risks of serious complication is 1-2 in 123XX123 with diagnostic cardiac cath and 1-2% or less  with angioplasty/stenting.   We discussed the expected course with TAVR if she ultimately is treated. They understand that TAVR might improve her cardiopulmonary outlook and by improving dyspnea and possibly improving her ability to tolerate dialysis. However, they also understand it will probably not result in any significant change in her overall functional capacity. In addition, she is at high risk of permanent pacemaker with TAVR in the setting of bifascicular block and atrial flutter. We discussed anticoagulation for atrial flutter, but deferred this for now because it would require interruption for various procedures.   Current medicines are reviewed with the patient today.  The patient does not have concerns regarding medicines.  Labs/ tests ordered today include:  No orders of the defined types were placed in this encounter.  Disposition:   Pending cardiac catheterization result  Signed, Sherren Mocha, MD  08/07/2016 2:23 PM    [facility]  [facility] [address], [place], [place]  [address] Phone: [phone]; Fax: [phone]

## 2016-08-09 ENCOUNTER — Encounter: Payer: Self-pay | Admitting: Cardiovascular Disease

## 2016-08-14 ENCOUNTER — Other Ambulatory Visit: Payer: Self-pay | Admitting: Cardiovascular Disease

## 2016-08-14 DIAGNOSIS — I350 Nonrheumatic aortic (valve) stenosis: Secondary | ICD-10-CM

## 2016-08-19 ENCOUNTER — Ambulatory Visit (HOSPITAL_BASED_OUTPATIENT_CLINIC_OR_DEPARTMENT_OTHER)
Admission: RE | Admit: 2016-08-19 | Discharge: 2016-08-19 | Disposition: A | Discharge status: Home / Self Care | Payer: Medicare Other | Source: Ambulatory Visit | Attending: Cardiovascular Disease | Admitting: Cardiovascular Disease

## 2016-08-19 ENCOUNTER — Encounter (HOSPITAL_COMMUNITY): Payer: Self-pay | Admitting: *Deleted

## 2016-08-19 ENCOUNTER — Ambulatory Visit (HOSPITAL_COMMUNITY)
Admission: RE | Admit: 2016-08-19 | Discharge: 2016-08-19 | Disposition: A | Discharge status: Home / Self Care | Payer: Medicare Other | Source: Ambulatory Visit | Attending: Cardiovascular Disease | Admitting: Cardiovascular Disease

## 2016-08-19 ENCOUNTER — Encounter (HOSPITAL_COMMUNITY)
Admission: RE | Disposition: A | Discharge status: Home / Self Care | Payer: Self-pay | Source: Ambulatory Visit | Attending: Cardiovascular Disease

## 2016-08-19 DIAGNOSIS — Z992 Dependence on renal dialysis: Secondary | ICD-10-CM | POA: Insufficient documentation

## 2016-08-19 DIAGNOSIS — N186 End stage renal disease: Secondary | ICD-10-CM | POA: Insufficient documentation

## 2016-08-19 DIAGNOSIS — Z79899 Other long term (current) drug therapy: Secondary | ICD-10-CM | POA: Diagnosis not present

## 2016-08-19 DIAGNOSIS — Z794 Long term (current) use of insulin: Secondary | ICD-10-CM | POA: Insufficient documentation

## 2016-08-19 DIAGNOSIS — I4892 Unspecified atrial flutter: Secondary | ICD-10-CM | POA: Diagnosis not present

## 2016-08-19 DIAGNOSIS — I272 Pulmonary hypertension, unspecified: Secondary | ICD-10-CM | POA: Insufficient documentation

## 2016-08-19 DIAGNOSIS — I120 Hypertensive chronic kidney disease with stage 5 chronic kidney disease or end stage renal disease: Secondary | ICD-10-CM | POA: Insufficient documentation

## 2016-08-19 DIAGNOSIS — I2510 Atherosclerotic heart disease of native coronary artery without angina pectoris: Secondary | ICD-10-CM | POA: Diagnosis not present

## 2016-08-19 DIAGNOSIS — I350 Nonrheumatic aortic (valve) stenosis: Secondary | ICD-10-CM | POA: Diagnosis present

## 2016-08-19 DIAGNOSIS — E1122 Type 2 diabetes mellitus with diabetic chronic kidney disease: Secondary | ICD-10-CM | POA: Diagnosis not present

## 2016-08-19 HISTORY — DX: Nonrheumatic aortic (valve) stenosis: I35.0

## 2016-08-19 HISTORY — PX: CARDIAC CATHETERIZATION: SHX172

## 2016-08-19 LAB — POCT I-STAT 3, VENOUS BLOOD GAS (G3P V)
ACID-BASE EXCESS: 2 mmol/L (ref 0.0–2.0)
Acid-Base Excess: 2 mmol/L (ref 0.0–2.0)
Bicarbonate: 26.4 mmol/L (ref 20.0–28.0)
Bicarbonate: 26.6 mmol/L (ref 20.0–28.0)
O2 Saturation: 77 %
O2 Saturation: 99 %
PCO2 VEN: 40.4 mmHg — AB (ref 44.0–60.0)
PH VEN: 7.424 (ref 7.250–7.430)
TCO2: 28 mmol/L (ref 0–100)
TCO2: 28 mmol/L (ref 0–100)
pCO2, Ven: 41.2 mmHg — ABNORMAL LOW (ref 44.0–60.0)
pH, Ven: 7.419 (ref 7.250–7.430)
pO2, Ven: 131 mmHg — ABNORMAL HIGH (ref 32.0–45.0)
pO2, Ven: 41 mmHg (ref 32.0–45.0)

## 2016-08-19 LAB — BASIC METABOLIC PANEL
Anion gap: 12 (ref 5–15)
BUN: 47 mg/dL — AB (ref 6–20)
CALCIUM: 8.7 mg/dL — AB (ref 8.9–10.3)
CHLORIDE: 103 mmol/L (ref 101–111)
CO2: 24 mmol/L (ref 22–32)
CREATININE: 8.22 mg/dL — AB (ref 0.44–1.00)
GFR calc non Af Amer: 4 mL/min — ABNORMAL LOW (ref >60)
GFR, EST AFRICAN AMERICAN: 5 mL/min — AB (ref >60)
Glucose, Bld: 108 mg/dL — ABNORMAL HIGH (ref 65–99)
Potassium: 4.6 mmol/L (ref 3.5–5.1)
SODIUM: 139 mmol/L (ref 135–145)

## 2016-08-19 LAB — CBC
HCT: 34.7 % — ABNORMAL LOW (ref 36.0–46.0)
Hemoglobin: 11.6 g/dL — ABNORMAL LOW (ref 12.0–15.0)
MCH: 31.3 pg (ref 26.0–34.0)
MCHC: 33.4 g/dL (ref 30.0–36.0)
MCV: 93.5 fL (ref 78.0–100.0)
PLATELETS: 175 10*3/uL (ref 150–400)
RBC: 3.71 MIL/uL — AB (ref 3.87–5.11)
RDW: 14.9 % (ref 11.5–15.5)
WBC: 6.8 10*3/uL (ref 4.0–10.5)

## 2016-08-19 LAB — GLUCOSE, CAPILLARY
GLUCOSE-CAPILLARY: 152 mg/dL — AB (ref 65–99)
GLUCOSE-CAPILLARY: 129 mg/dL — AB (ref 65–99)
Glucose-Capillary: 71 mg/dL (ref 65–99)

## 2016-08-19 LAB — PROTIME-INR
INR: 1.09
PROTHROMBIN TIME: 14.1 s (ref 11.4–15.2)

## 2016-08-19 SURGERY — RIGHT/LEFT HEART CATH AND CORONARY ANGIOGRAPHY
Anesthesia: LOCAL

## 2016-08-19 MED ORDER — HEPARIN (PORCINE) IN NACL 2-0.9 UNIT/ML-% IJ SOLN
INTRAMUSCULAR | Status: DC | PRN
  Administered 2016-08-19: 1500 mL via INTRA_ARTERIAL

## 2016-08-19 MED ORDER — LABETALOL HCL 5 MG/ML IV SOLN
INTRAVENOUS | Status: AC
  Filled 2016-08-19: qty 4

## 2016-08-19 MED ORDER — MIDAZOLAM HCL 2 MG/2ML IJ SOLN
INTRAMUSCULAR | Status: DC | PRN
  Administered 2016-08-19: 0.5 mg via INTRAVENOUS

## 2016-08-19 MED ORDER — FENTANYL CITRATE (PF) 100 MCG/2ML IJ SOLN
INTRAMUSCULAR | Status: AC
  Filled 2016-08-19: qty 2

## 2016-08-19 MED ORDER — SODIUM CHLORIDE 0.9% FLUSH: 3.0000 mL | Freq: Two times a day (BID) | INTRAVENOUS | Status: DC

## 2016-08-19 MED ORDER — FENTANYL CITRATE (PF) 100 MCG/2ML IJ SOLN
INTRAMUSCULAR | Status: DC | PRN
  Administered 2016-08-19: 12.5 ug via INTRAVENOUS

## 2016-08-19 MED ORDER — LABETALOL HCL 5 MG/ML IV SOLN
20.0000 mg | Freq: Once | INTRAVENOUS | Status: AC
Start: 2016-08-19 — End: 2016-08-19
  Administered 2016-08-19: 20 mg via INTRAVENOUS

## 2016-08-19 MED ORDER — LIDOCAINE HCL (PF) 1 % IJ SOLN
INTRAMUSCULAR | Status: DC | PRN
  Administered 2016-08-19: 20 mL via INTRADERMAL

## 2016-08-19 MED ORDER — SODIUM CHLORIDE 0.9% FLUSH: 3.0000 mL | INTRAVENOUS | Status: DC | PRN

## 2016-08-19 MED ORDER — LABETALOL HCL 5 MG/ML IV SOLN
INTRAVENOUS | Status: DC | PRN
  Administered 2016-08-19: 20 mg via INTRAVENOUS

## 2016-08-19 MED ORDER — ONDANSETRON HCL 4 MG/2ML IJ SOLN: 4.0000 mg | Freq: Four times a day (QID) | INTRAMUSCULAR | Status: DC | PRN

## 2016-08-19 MED ORDER — HYDRALAZINE HCL 20 MG/ML IJ SOLN: 10.0000 mg | Freq: Once | INTRAMUSCULAR | Status: DC

## 2016-08-19 MED ORDER — SODIUM CHLORIDE 0.9 % IV SOLN
INTRAVENOUS | Status: DC
  Administered 2016-08-19: 12:00:00 via INTRAVENOUS

## 2016-08-19 MED ORDER — ACETAMINOPHEN 325 MG PO TABS: 650.0000 mg | ORAL_TABLET | ORAL | Status: DC | PRN

## 2016-08-19 MED ORDER — SODIUM CHLORIDE 0.9 % IV SOLN: 250.0000 mL | INTRAVENOUS | Status: DC | PRN

## 2016-08-19 MED ORDER — MIDAZOLAM HCL 2 MG/2ML IJ SOLN
INTRAMUSCULAR | Status: AC
Start: 2016-08-19 — End: 2016-08-19
  Filled 2016-08-19: qty 2

## 2016-08-19 MED ORDER — IOPAMIDOL (ISOVUE-370) INJECTION 76%
INTRAVENOUS | Status: DC | PRN
  Administered 2016-08-19: 95 mL via INTRA_ARTERIAL

## 2016-08-19 MED ORDER — LIDOCAINE HCL (PF) 1 % IJ SOLN
INTRAMUSCULAR | Status: AC
  Filled 2016-08-19: qty 30

## 2016-08-19 MED ORDER — HEPARIN (PORCINE) IN NACL 2-0.9 UNIT/ML-% IJ SOLN
INTRAMUSCULAR | Status: AC
  Filled 2016-08-19: qty 1500

## 2016-08-19 MED ORDER — IOPAMIDOL (ISOVUE-370) INJECTION 76%
INTRAVENOUS | Status: AC
  Filled 2016-08-19: qty 100

## 2016-08-19 SURGICAL SUPPLY — 15 items
CATH INFINITI 5FR AL1 (CATHETERS) ×2 IMPLANT
CATH INFINITI 5FR MULTPACK ANG (CATHETERS) ×2 IMPLANT
CATH SWAN GANZ 7F STRAIGHT (CATHETERS) ×2 IMPLANT
GUIDEWIRE INQWIRE 1.5J.035X260 (WIRE) ×1 IMPLANT
INQWIRE 1.5J .035X260CM (WIRE) ×2
KIT HEART LEFT (KITS) ×2 IMPLANT
KIT HEART RIGHT NAMIC (KITS) ×2 IMPLANT
PACK CARDIAC CATHETERIZATION (CUSTOM PROCEDURE TRAY) ×2 IMPLANT
SHEATH PINNACLE 5F 10CM (SHEATH) ×2 IMPLANT
SHEATH PINNACLE 7F 10CM (SHEATH) ×2 IMPLANT
SYR MEDRAD MARK V 150ML (SYRINGE) ×2 IMPLANT
TRANSDUCER W/STOPCOCK (MISCELLANEOUS) ×2 IMPLANT
TUBING CIL FLEX 10 FLL-RA (TUBING) ×2 IMPLANT
WIRE EMERALD 3MM-J .025X260CM (WIRE) ×2 IMPLANT
WIRE EMERALD ST .035X260CM (WIRE) ×2 IMPLANT

## 2016-08-19 NOTE — H&P (View-Only) (Signed)
Cardiology Office Note Date:  08/07/2016   ID:  Angela Trujillo, DOB 12-10-1930, MRN [id number]  PCP:  No primary care provider on file.  Cardiologist:  Sherren Mocha, MD    Chief Complaint  Patient presents with  . Aortic Stenosis     History of Present Illness: Angela Trujillo is a 82 y.o. female who presents for Evaluation of severe aortic stenosis.  She has had a longstanding heart murmur but otherwise has had no cardiac problems in the past. She has never undergone cardiac catheterization or other invasive cardiac evaluation in the past.   The patient was sick with pneumonia in February 2017 and was hospitalized at [facility]. She then was transferred to [facility] for approximately one month. Has been back home since May 5th. Her sons stay with her every night to keep an eye on her. She has been very sedentary since her illness earlier this year. She is essentially in a wheelchair when she's out of the house. Does very little walking.   The patient developed ESRD and has been on hemodialysis for the past 3 years. She dialyzes Tues, [place], Sat.  An echocardiogram was performed 07/03/2016. This demonstrated low normal LV function with an ejection fraction of 50-55%, moderate concentric LVH, mild to moderate mitral regurgitation, moderate tricuspid regurgitation with moderate to severe pulmonary hypertension, and severe aortic stenosis with peak and mean transaortic gradient of 79 and 51 mmHg, respectively. The aortic valve area calculated to 0.4 cm.  At times she has hypotension during dialysis and treatment has to be interrupted. When this occurs she complains of nausea but she has not had chest pain, chest pressure, lightheadedness, or syncope. She does admit to shortness of breath with low-level activity, which has occurred ever since she was hospitalized this year.   Other than an AV-fistula, she hasn't had surgery since undergoing a hysterectomy 50 years  ago. She has 5 children, and has been widowed 20 years.    Past Medical History:  Diagnosis Date  . Diabetes mellitus without complication   . Hypertension     Past Surgical History:  Procedure Laterality Date  . ABDOMINAL HYSTERECTOMY    . TUBAL LIGATION      Current Outpatient Prescriptions  Medication Sig Dispense Refill  . acetaminophen (TYLENOL) 325 MG tablet Take 650 mg by mouth every 4 (four) hours as needed.    . cloNIDine (CATAPRES) 0.2 MG tablet Take 0.2 mg by mouth 2 (two) times daily.    . ferrous sulfate 325 (65 FE) MG tablet Take 325 mg by mouth daily with breakfast.    . glyBURIDE (DIABETA) 5 MG tablet Take 5 mg by mouth daily with breakfast.    . hydrALAZINE (APRESOLINE) 100 MG tablet Take 1 tablet (100 mg total) by mouth every 8 (eight) hours. 120 tablet 3  . Insulin Detemir (LEVEMIR FLEXTOUCH) 100 UNIT/ML Pen Inject 14 Units into the skin 2 (two) times daily.    Marland Kitchen labetalol (NORMODYNE) 200 MG tablet Take 200 mg by mouth 2 (two) times daily.    . metoprolol succinate (TOPROL-XL) 100 MG 24 hr tablet Take 100 mg by mouth daily.    . saxagliptin HCl (ONGLYZA) 5 MG TABS tablet Take 5 mg by mouth daily.    . sodium bicarbonate 650 MG tablet Take 1 tablet (650 mg total) by mouth 4 (four) times daily. 120 tablet 2   No current facility-administered medications for this visit.     Allergies:   Diona Fanti [  aspirin]; Codeine; and Penicillins   Social History:  The patient  reports that she has never smoked. She does not have any smokeless tobacco history on file. She reports that she does not drink alcohol.   Family History:  The patient's  family history is not on file.    ROS:  Please see the history of present illness.  Otherwise, review of systems is positive for hearing loss, fatigue.  All other systems are reviewed and negative.    PHYSICAL EXAM: VS:  BP (!) 146/86   Pulse 72   Ht 5\' 5"  (1.651 m)   Wt 144 lb 12.8 oz (65.7 kg)   BMI 24.10 kg/m  , BMI Body mass  index is 24.1 kg/m. GEN: pleasant elderly woman, in no acute distress  HEENT: normal  Neck: no JVD, no masses. No carotid bruits Cardiac: RRR with 4/6 harsh systolic murmur at the RUSB with diminished A2                Respiratory:  clear to auscultation bilaterally, normal work of breathing GI: soft, nontender, nondistended, + BS MS: no deformity or atrophy  Ext: no pretibial edema Skin: warm and dry, no rash Neuro:  Strength and sensation are intact Psych: euthymic mood, full affect  EKG:  EKG is ordered today. The ekg ordered today shows Atrial flutter with 4:1 AV conduction, RBBB, LAFB, LVH with QRS widening  Recent Labs: 12/14/2015: TSH 1.491 12/21/2015: ALT 9 12/24/2015: Magnesium 2.1 12/31/2015: BUN 28; Creatinine, Ser 6.86; Hemoglobin 7.7; Platelets 148; Potassium 4.9; Sodium 139   Lipid Panel     Component Value Date/Time   CHOL 181 04/15/2013 1120   TRIG 91 04/15/2013 1120   HDL 62 04/15/2013 1120   CHOLHDL 2.9 04/15/2013 1120   VLDL 18 04/15/2013 1120   LDLCALC 101 (H) 04/15/2013 1120      Wt Readings from Last 3 Encounters:  08/07/16 144 lb 12.8 oz (65.7 kg)  04/15/13 157 lb 13.6 oz (71.6 kg)     Cardiac Studies Reviewed: 2D Echo report reviewed as above  ASSESSMENT AND PLAN: 1.  Severe, Stage D, aortic stenosis 2. Atrial flutter with 4:1 conduction 3. ESRD 4. Type II DM, insulin-requiring  Complex patient with multiple comorbid conditions and limited functional capacity, who clearly has severe symptomatic aortic stenosis with exertional dyspnea and hypotension intermittently during dialysis. She is here with 2 of her sons today. I have reviewed the natural history of aortic stenosis with the patient and her family members  who are present today. We have discussed the limitations of medical therapy and the poor prognosis associated with symptomatic aortic stenosis. We have reviewed potential treatment options, including palliative medical therapy, conventional  surgical aortic valve replacement, and transcatheter aortic valve replacement. We discussed treatment options in the context of this patient's specific comorbid medical conditions.   We discussed moving forward for evaluation of treatment options for her aortic stenosis, which clearly would be limited to TAVR. She would like to proceed with evaluation which will include right and left heart catheterization, CTA studies, and cardiac surgical evaluation. I have reviewed the risks, indications, and alternatives to cardiac catheterization, possible angioplasty, and stenting with the patient. Risks include but are not limited to bleeding, infection, vascular injury, stroke, myocardial infection, arrhythmia, kidney injury, radiation-related injury in the case of prolonged fluoroscopy use, emergency cardiac surgery, and death. The patient understands the risks of serious complication is 1-2 in 123XX123 with diagnostic cardiac cath and 1-2% or less  with angioplasty/stenting.   We discussed the expected course with TAVR if she ultimately is treated. They understand that TAVR might improve her cardiopulmonary outlook and by improving dyspnea and possibly improving her ability to tolerate dialysis. However, they also understand it will probably not result in any significant change in her overall functional capacity. In addition, she is at high risk of permanent pacemaker with TAVR in the setting of bifascicular block and atrial flutter. We discussed anticoagulation for atrial flutter, but deferred this for now because it would require interruption for various procedures.   Current medicines are reviewed with the patient today.  The patient does not have concerns regarding medicines.  Labs/ tests ordered today include:  No orders of the defined types were placed in this encounter.  Disposition:   Pending cardiac catheterization result  Signed, Sherren Mocha, MD  08/07/2016 2:23 PM    [facility]  [facility] [address] [place], [place], [place]  [address] Phone: [phone]; Fax: [phone]

## 2016-08-19 NOTE — Progress Notes (Signed)
Site area: Right groin a 5 french arterial and 7 french venous sheath was removed  Site Prior to Removal:  Level 0  Pressure Applied For 15 MINUTES    Bedrest Beginning at 1610p  Manual:   Yes.    Patient Status During Pull:  stable  Post Pull Groin Site:  Level 0  Post Pull Instructions Given:  Yes.    Post Pull Pulses Present:  Yes.    Dressing Applied:  Yes.    Comments:  VS remain stable at this time,.

## 2016-08-19 NOTE — Discharge Instructions (Signed)

## 2016-08-19 NOTE — Interval H&P Note (Signed)
History and Physical Interval Note:  08/19/2016 2:14 PM  Angela Trujillo  has presented today for surgery, with the diagnosis of aortic stenosis  The various methods of treatment have been discussed with the patient and family. After consideration of risks, benefits and other options for treatment, the patient has consented to  Procedure(s): Right/Left Heart Cath and Coronary Angiography (N/A) as a surgical intervention .  The patient's history has been reviewed, patient examined, no change in status, stable for surgery.  I have reviewed the patient's chart and labs.  Questions were answered to the patient's satisfaction.     Sherren Mocha

## 2016-08-20 ENCOUNTER — Encounter (HOSPITAL_COMMUNITY): Payer: Self-pay | Admitting: Cardiovascular Disease

## 2016-08-20 LAB — ECHOCARDIOGRAM COMPLETE
HEIGHTINCHES: 65 in
Weight: 2304 oz

## 2016-08-26 ENCOUNTER — Encounter: Payer: Self-pay | Admitting: Cardiovascular Disease

## 2016-08-26 ENCOUNTER — Ambulatory Visit (HOSPITAL_COMMUNITY)
Admission: RE | Admit: 2016-08-26 | Discharge: 2016-08-26 | Disposition: A | Discharge status: Home / Self Care | Payer: Medicare Other | Source: Ambulatory Visit | Attending: Cardiovascular Disease | Admitting: Cardiovascular Disease

## 2016-08-26 ENCOUNTER — Other Ambulatory Visit: Payer: Self-pay | Admitting: Cardiovascular Disease

## 2016-08-26 ENCOUNTER — Encounter (HOSPITAL_COMMUNITY): Payer: Self-pay

## 2016-08-26 DIAGNOSIS — R590 Localized enlarged lymph nodes: Secondary | ICD-10-CM | POA: Diagnosis not present

## 2016-08-26 DIAGNOSIS — I350 Nonrheumatic aortic (valve) stenosis: Secondary | ICD-10-CM | POA: Diagnosis present

## 2016-08-26 DIAGNOSIS — I700 Atherosclerosis of aorta: Secondary | ICD-10-CM | POA: Insufficient documentation

## 2016-08-26 DIAGNOSIS — I4892 Unspecified atrial flutter: Secondary | ICD-10-CM | POA: Diagnosis present

## 2016-08-26 DIAGNOSIS — Z789 Other specified health status: Secondary | ICD-10-CM

## 2016-08-26 NOTE — Progress Notes (Signed)
Radiology RN and IV team unable to gain IV access for Ct heart test. CT will reschedule for pt to come back and have IR radiology MD to place IV.

## 2016-08-27 ENCOUNTER — Ambulatory Visit (HOSPITAL_COMMUNITY): Payer: Medicare Other

## 2016-08-27 ENCOUNTER — Ambulatory Visit (HOSPITAL_COMMUNITY): Admission: RE | Admit: 2016-08-27 | Payer: Medicare Other | Source: Ambulatory Visit

## 2016-08-28 ENCOUNTER — Ambulatory Visit (HOSPITAL_COMMUNITY): Admission: RE | Admit: 2016-08-28 | Payer: Medicare Other | Source: Ambulatory Visit

## 2016-08-28 ENCOUNTER — Encounter: Payer: Self-pay | Admitting: Cardiovascular Disease

## 2016-08-28 NOTE — Telephone Encounter (Signed)
New message  Peggy from [facility] schedulining  Please call Vickii Chafe   [phone]  In reference to an order put in for this patient

## 2016-08-31 ENCOUNTER — Encounter (HOSPITAL_COMMUNITY): Payer: Self-pay | Admitting: Interventional Radiology

## 2016-08-31 ENCOUNTER — Institutional Professional Consult (permissible substitution) (INDEPENDENT_AMBULATORY_CARE_PROVIDER_SITE_OTHER): Payer: Medicare Other | Admitting: Surgery

## 2016-08-31 ENCOUNTER — Encounter: Payer: Self-pay | Admitting: Surgery

## 2016-08-31 ENCOUNTER — Ambulatory Visit (HOSPITAL_COMMUNITY)
Admission: RE | Admit: 2016-08-31 | Discharge: 2016-08-31 | Disposition: A | Discharge status: Home / Self Care | Payer: Medicare Other | Source: Ambulatory Visit | Attending: Cardiovascular Disease | Admitting: Cardiovascular Disease

## 2016-08-31 VITALS — BP 138/66 | HR 74 | Resp 20 | Ht 65.0 in | Wt 144.0 lb

## 2016-08-31 DIAGNOSIS — R590 Localized enlarged lymph nodes: Secondary | ICD-10-CM | POA: Insufficient documentation

## 2016-08-31 DIAGNOSIS — I517 Cardiomegaly: Secondary | ICD-10-CM | POA: Insufficient documentation

## 2016-08-31 DIAGNOSIS — I4892 Unspecified atrial flutter: Secondary | ICD-10-CM | POA: Diagnosis not present

## 2016-08-31 DIAGNOSIS — N200 Calculus of kidney: Secondary | ICD-10-CM | POA: Insufficient documentation

## 2016-08-31 DIAGNOSIS — I350 Nonrheumatic aortic (valve) stenosis: Secondary | ICD-10-CM | POA: Diagnosis present

## 2016-08-31 DIAGNOSIS — K5730 Diverticulosis of large intestine without perforation or abscess without bleeding: Secondary | ICD-10-CM | POA: Insufficient documentation

## 2016-08-31 DIAGNOSIS — I700 Atherosclerosis of aorta: Secondary | ICD-10-CM | POA: Diagnosis not present

## 2016-08-31 DIAGNOSIS — I708 Atherosclerosis of other arteries: Secondary | ICD-10-CM | POA: Diagnosis not present

## 2016-08-31 DIAGNOSIS — Z789 Other specified health status: Secondary | ICD-10-CM

## 2016-08-31 HISTORY — PX: IR GENERIC HISTORICAL: IMG1180011

## 2016-08-31 MED ORDER — IOPAMIDOL (ISOVUE-370) INJECTION 76%
INTRAVENOUS | Status: AC
  Administered 2016-08-31: 75 mL
  Filled 2016-08-31: qty 100

## 2016-08-31 MED ORDER — METOPROLOL TARTRATE 5 MG/5ML IV SOLN
INTRAVENOUS | Status: AC
  Administered 2016-08-31: 5 mg
  Administered 2016-08-31: 13:00:00
  Filled 2016-08-31: qty 10

## 2016-08-31 MED ORDER — LIDOCAINE HCL 1 % IJ SOLN
INTRAMUSCULAR | Status: AC
  Administered 2016-08-31: 6 mL
  Filled 2016-08-31: qty 20

## 2016-08-31 MED ORDER — IOPAMIDOL (ISOVUE-370) INJECTION 76%
INTRAVENOUS | Status: AC
  Administered 2016-08-31: 75 mL
  Filled 2016-08-31: qty 50

## 2016-08-31 NOTE — Telephone Encounter (Signed)
This encounter was created in error - please disregard.

## 2016-08-31 NOTE — Procedures (Signed)
Interventional Radiology Procedure Note  Procedure: US guided left basilic vein IV start. OK to use.   Complications: None Recommendations:  - Ok to use    Signed,  Dulcy Fanny. Earleen Newport, DO

## 2016-09-01 ENCOUNTER — Other Ambulatory Visit: Payer: Self-pay | Admitting: *Deleted

## 2016-09-01 ENCOUNTER — Encounter: Payer: Self-pay | Admitting: Surgery

## 2016-09-01 DIAGNOSIS — I350 Nonrheumatic aortic (valve) stenosis: Secondary | ICD-10-CM

## 2016-09-01 NOTE — Progress Notes (Signed)
HEART AND [place] [facility]GERY CONSULTATION REPORT  Referring Provider is Sherren Mocha, MD PCP is Doretha Sou, MD  Chief Complaint  Patient presents with  . Aortic Stenosis    Surgical eval for TAVR, review all studies    HPI:  The patient is an 82 year old woman with DM, hypertension, atrial flutter, and ESRD who has been on HD for the past three years. She developed pneumonia in February 2017 and was hospitalized at [facility] for several weeks before being transferred to [facility] her for about one month to recover. She has been back home since May 5th and her sons alternate staying with her. She has been fairly sedentary since but says that she can walk around her house and takes care of herself. She uses a wheelchair when out of the house. She had a 2D echo done on 07/03/2016 due to a heart murmur and this showed severe AS with a mean gradient of 51 mm Hg with an AVA of 0.4 cm2. The LVEF was 50-55% with mild to moderate MR, moderate TR and moderate to severe pulmonary hypertension. She was seen by Dr. Burt Knack and underwent cath on 08/19/2016 showing minimal non-obstructive CAD with a mean AV gradient of 40 mm Hg. There was severe pulmonary hypertension at 69/22 with a PVR of 2 Woods Units.  She reports shortness of breath with low-level activity in the house. She has not had any chest pain or tightness, recent dizziness or syncope. Her sons do report two episodes of syncope last year prior to her hospitalization in Feb. She has had some hypotension during dialysis that shortened the session. She has had some nausea towards the end of the dialysis session if she eats during the session.   Past Medical History:  Diagnosis Date  . Atrial flutter ([place])   . Diabetes mellitus with end stage renal disease ([place])   . ESRD (end stage renal disease) on dialysis ([place])   . Hypertension   . Severe aortic stenosis  08/19/2016    Past Surgical History:  Procedure Laterality Date  . ABDOMINAL HYSTERECTOMY    . CARDIAC CATHETERIZATION N/A 08/19/2016   Procedure: Right/Left Heart Cath and Coronary Angiography;  Surgeon: Sherren Mocha, MD;  Location: [place] [facility];  Service: Cardiovascular;  Laterality: N/A;  . IR GENERIC HISTORICAL  08/31/2016   IR RADIOLOGY PERIPHERAL GUIDED IV START 08/31/2016 Corrie Mckusick, DO [facility]-INTER[facility] RAD  . TUBAL LIGATION    AV fistula insertion  Family History:   Negative for aortic valve disease.  Social History   Social History  . Marital status: Widowed    Spouse name: N/A  . Number of children: N/A  . Years of education: N/A   Occupational History  . Not on file.   Social History Main Topics  . Smoking status: Never Smoker  . Smokeless tobacco: Not on file  . Alcohol use No  . Drug use: Unknown  . Sexual activity: No   Other Topics Concern  . Not on file   Social History Narrative  . No narrative on file    Current Outpatient Prescriptions  Medication Sig Dispense Refill  . atorvastatin (LIPITOR) 20 MG tablet Take 20 mg by mouth at bedtime.    . calcium acetate (PHOSLO) 667 MG capsule Take 667 mg by mouth daily with breakfast.    . famotidine (PEPCID) 20 MG tablet Take 20 mg by mouth 2 (two) times daily.    Marland Kitchen  Insulin Detemir (LEVEMIR FLEXTOUCH) 100 UNIT/ML Pen Inject 14 Units into the skin 2 (two) times daily.    . metoprolol succinate (TOPROL-XL) 100 MG 24 hr tablet Take 100 mg by mouth daily.    . SENSIPAR 30 MG tablet Take 30 mg by mouth See admin instructions. The son believes they give this to her at dialysis on Tue, Thur, Sat.     No current facility-administered medications for this visit.     Allergies  Allergen Reactions  . Asa [Aspirin] Nausea Only    Makes stomach hurt also  . Codeine Rash  . Penicillins Rash    Has patient had a PCN reaction causing immediate rash, facial/tongue/throat swelling, SOB or lightheadedness with  hypotension: No Has patient had a PCN reaction causing severe rash involving mucus membranes or skin necrosis: No Has patient had a PCN reaction that required hospitalization: No Has patient had a PCN reaction occurring within the last 10 years: No If all of the above answers are "NO", then may proceed with Cephalosporin use.       Review of Systems:   General:  normal appetite, decreased energy, no weight gain, no weight loss, no fever  Cardiac:  no chest pain with exertion, no chest pain at rest, has SOB with mild exertion, no resting SOB, no PND, no orthopnea, no palpitations, no arrhythmia, no atrial fibrillation, no LE edema, no dizzy spells, prior syncope last year x 2.  Respiratory:  exertional shortness of breath, no home oxygen, no productive cough, no dry cough, no bronchitis, no wheezing, no hemoptysis, no asthma, no pain with inspiration or cough, no sleep apnea, no CPAP at night  GI:   no difficulty swallowing, no reflux, no frequent heartburn, no hiatal hernia, no abdominal pain, no constipation, no diarrhea, no hematochezia, no hematemesis, no melena  GU:   no dysuria,  no frequency, no urinary tract infection, no hematuria,  no kidney stones, end stage kidney disease  Vascular:  no pain suggestive of claudication, no pain in feet, no leg cramps, no varicose veins, no DVT, no non-healing foot ulcer  Neuro:   no stroke, no TIA's, no seizures, no headaches, no temporary blindness one eye,  no slurred speech, no peripheral neuropathy, no chronic pain, has instability of gait, no memory/cognitive dysfunction  Musculoskeletal: no arthritis, no joint swelling, no myalgias, has difficulty walking, reduced mobility   Skin:   no rash, no itching, no skin infections, no pressure sores or ulcerations  Psych:   no anxiety, no depression, on nervousness, no unusual recent stress  Eyes:   no blurry vision, no floaters, no recent vision changes,  wears glasses or contacts  ENT:   no hearing  loss, no loose or painful teeth, partial dentures, last saw dentist several years ago  Hematologic:  no easy bruising, no abnormal bleeding, no clotting disorder, no frequent epistaxis  Endocrine:  has diabetes, does not check CBG's at home           Physical Exam:   BP 138/66   Pulse 74   Resp 20   Ht 5\' 5"  (1.651 m)   Wt 144 lb (65.3 kg)   SpO2 97% Comment: RA  BMI 23.96 kg/m   General:  Elderly chronically ill-appearing woman in no distress.  HEENT:  Unremarkable , NCAT, PERLA, EOMI, Oropharynx clear, remaining teeth in good condition  Neck:   no JVD, no bruits, no adenopathy or thyromegaly  Chest:   clear to auscultation, symmetrical breath sounds,  no wheezes, no rhonchi   CV:   RRR, grade III/VI crescendo/decrescendo murmur heard best at RSB,  no diastolic murmur  Abdomen:  soft, non-tender, no masses or organomegaly  Extremities:  warm, well-perfused, pulses palpable in feet, no LE edema  Rectal/GU  Deferred  Neuro:   Grossly non-focal and symmetrical throughout  Skin:   Clean and dry, no rashes, no breakdown   Diagnostic Tests:         *[place]*                   *[facility]*                         [address], [place] [address]                            [phone]  ------------------------------------------------------------------- Transthoracic Echocardiography  Patient:    Cathryn, Pettis MR #:       [id number] Study Date: 08/19/2016 Gender:     F Age:        71 Height:     165.1 cm Weight:     65.3 kg BSA:        1.74 m^2 Pt. Status: Room:   ADMITTING    Sherren Mocha, MD  ATTENDING    Sherren Mocha, MD  ORDERING     Sherren Mocha, MD  REFERRING    Sherren Mocha, MD  PERFORMING   [facility], Inpatient  SONOGRAPHER  [facility]  cc:  ------------------------------------------------------------------- LV EF: 55% -    60%  ------------------------------------------------------------------- Indications:      Aortic stenosis 424.1.  ------------------------------------------------------------------- History:   PMH:   Atrial flutter.  Risk factors:  Hypertension. Diabetes mellitus.  ------------------------------------------------------------------- Study Conclusions  - Left ventricle: The cavity size was normal. There was mild   concentric hypertrophy. Systolic function was normal. The   estimated ejection fraction was in the range of 55% to 60%. Wall   motion was normal; there were no regional wall motion   abnormalities. Features are consistent with a pseudonormal left   ventricular filling pattern, with concomitant abnormal relaxation   and increased filling pressure (grade 2 diastolic dysfunction).   Doppler parameters are consistent with elevated ventricular   end-diastolic filling pressure. - Aortic valve: Trileaflet; severely thickened, severely calcified   leaflets. Valve mobility was restricted. There was severe   stenosis. Mean gradient (S): 46 mm Hg. Peak gradient (S): 76 mm   Hg. - Aortic root: The aortic root was normal in size. - Ascending aorta: The ascending aorta was normal in size. - Mitral valve: Mildly thickened, mildly calcified leaflets .   Mobility was restricted. There was mild regurgitation. - Left atrium: The atrium was moderately dilated. - Right ventricle: Systolic function was normal. - Right atrium: The atrium was mildly dilated. - Tricuspid valve: There was moderate regurgitation. - Pulmonic valve: There was no regurgitation. - Pulmonary arteries: Systolic pressure was moderately increased.   PA peak pressure: 48 mm Hg (S). - Inferior vena cava: The vessel was normal in size. - Pericardium, extracardiac: There was no pericardial effusion.  Impressions:  - Severe aortic stenosis. Moderate pulmonary  hypertension.  ------------------------------------------------------------------- Study data:  No prior study was available for comparison.  Study status:  Routine.  Procedure:  The patient reported no pain pre or post test. Transthoracic echocardiography. Image quality was adequate.  Study completion:  There were no complications. Transthoracic echocardiography.  M-mode, complete 2D, spectral Doppler, and color Doppler.  Birthdate:  Patient birthdate: 1931/08/30.  Age:  Patient is 82 yr old.  Sex:  Gender: female. BMI: 24 kg/m^2.  Blood pressure:     164/75  Patient status: Inpatient.  Study date:  Study date: 08/19/2016. Study time: 05:25 PM.  Location:  Bedside.  -------------------------------------------------------------------  ------------------------------------------------------------------- Left ventricle:  The cavity size was normal. There was mild concentric hypertrophy. Systolic function was normal. The estimated ejection fraction was in the range of 55% to 60%. Wall motion was normal; there were no regional wall motion abnormalities. Features are consistent with a pseudonormal left ventricular filling pattern, with concomitant abnormal relaxation and increased filling pressure (grade 2 diastolic dysfunction). Doppler parameters are consistent with elevated ventricular end-diastolic filling pressure.  ------------------------------------------------------------------- Aortic valve:   Trileaflet; severely thickened, severely calcified leaflets. Valve mobility was restricted.  Doppler:   There was severe stenosis.   There was no regurgitation.    Mean gradient (S): 46 mm Hg. Peak gradient (S): 76 mm Hg.  ------------------------------------------------------------------- Aorta:  Aortic root: The aortic root was normal in size. Ascending aorta: The ascending aorta was normal in size.  ------------------------------------------------------------------- Mitral  valve:   Mildly thickened, mildly calcified leaflets . Mobility was restricted.  Doppler:  Transvalvular velocity was within the normal range. There was no evidence for stenosis. There was mild regurgitation.    Peak gradient (D): 7 mm Hg.  ------------------------------------------------------------------- Left atrium:  The atrium was moderately dilated.  ------------------------------------------------------------------- Right ventricle:  The cavity size was normal. Wall thickness was normal. Systolic function was normal.  ------------------------------------------------------------------- Pulmonic valve:    Structurally normal valve.   Cusp separation was normal.  Doppler:  Transvalvular velocity was within the normal range. There was no evidence for stenosis. There was no regurgitation.  ------------------------------------------------------------------- Tricuspid valve:   Structurally normal valve.    Doppler: Transvalvular velocity was within the normal range. There was moderate regurgitation.  ------------------------------------------------------------------- Pulmonary artery:   The main pulmonary artery was normal-sized. Systolic pressure was moderately increased.  ------------------------------------------------------------------- Right atrium:  The atrium was mildly dilated.  ------------------------------------------------------------------- Pericardium:  There was no pericardial effusion.  ------------------------------------------------------------------- Systemic veins: Inferior vena cava: The vessel was normal in size.  ------------------------------------------------------------------- Measurements   Left ventricle                         Value        Reference  LV ID, ED, PLAX chordal                46.3  mm     43 - 52  LV ID, ES, PLAX chordal                32.7  mm     23 - 38  LV fx shortening, PLAX chordal         29    %      >=29  LV PW  thickness, ED                    15.1  mm     ---------  IVS/LV PW ratio, ED                    0.72         <=  1.3  LV e&', lateral                         7.82  cm/s   ---------  LV E/e&', lateral                       17.26        ---------  LV e&', medial                          4.05  cm/s   ---------  LV E/e&', medial                        33.33        ---------  LV e&', average                         5.94  cm/s   ---------  LV E/e&', average                       22.75        ---------    Ventricular septum                     Value        Reference  IVS thickness, ED                      10.8  mm     ---------    LVOT                                   Value        Reference  LVOT ID, S                             22    mm     ---------  LVOT area                              3.8   cm^2   ---------    Aortic valve                           Value        Reference  Aortic valve peak velocity, S          435   cm/s   ---------  Aortic valve mean velocity, S          322   cm/s   ---------  Aortic valve VTI, S                    107   cm     ---------  Aortic mean gradient, S                46    mm Hg  ---------  Aortic peak gradient, S                76    mm Hg  ---------    Aorta  Value        Reference  Aortic root ID, ED                     31    mm     ---------    Left atrium                            Value        Reference  LA ID, A-P, ES                         42    mm     ---------  LA ID/bsa, A-P                 (H)     2.42  cm/m^2 <=2.2  LA volume, S                           126   ml     ---------  LA volume/bsa, S                       72.5  ml/m^2 ---------  LA volume, ES, 1-p A4C                 112   ml     ---------  LA volume/bsa, ES, 1-p A4C             64.4  ml/m^2 ---------  LA volume, ES, 1-p A2C                 133   ml     ---------  LA volume/bsa, ES, 1-p A2C             76.5  ml/m^2 ---------    Mitral valve                            Value        Reference  Mitral E-wave peak velocity            135   cm/s   ---------  Mitral A-wave peak velocity            124   cm/s   ---------  Mitral deceleration time               208   ms     150 - 230  Mitral peak gradient, D                7     mm Hg  ---------  Mitral E/A ratio, peak                 1.1          ---------    Pulmonary arteries                     Value        Reference  PA pressure, S, DP             (H)     48    mm Hg  <=30    Tricuspid valve                        Value  Reference  Tricuspid regurg peak velocity         335   cm/s   ---------  Tricuspid peak RV-RA gradient          45    mm Hg  ---------    Systemic veins                         Value        Reference  Estimated CVP                          3     mm Hg  ---------    Right ventricle                        Value        Reference  TAPSE                                  24.4  mm     ---------  RV pressure, S, DP             (H)     48    mm Hg  <=30  RV s&', lateral, S                      10.1  cm/s   ---------  Legend: (L)  and  (H)  mark values outside specified reference range.  ------------------------------------------------------------------- Prepared and Electronically Authenticated by  Ena Dawley, M.D. 2017-11-30T14:11:49  Physicians   Panel Physicians Referring Physician Case Authorizing Physician  Sherren Mocha, MD (Primary)    Procedures   Right/Left Heart Cath and Coronary Angiography  Conclusion   1. Patent coronary arteries with minimal nonobstructive CAD 2. Severe calcific aortic stenosis with mean gradient 40 mmHg and AVA calculated 0.97 square cm 3. Severe pulmonary HTN likely secondary to left heart disease (PVR 2 Woods Units)  Plan: Continued evaluation for TAVR.   Indications   Severe aortic stenosis [I35.0 (ICD-10-CM)]  Procedural Details/Technique   Technical Details INDICATION: Severe symptomatic aortic stenosis  (Stage D)  PROCEDURAL DETAILS: The right groin was prepped, draped, and anesthetized with 1% lidocaine. Using the modified Seldinger technique a 5 French sheath was placed in the right femoral artery and a 7 French sheath was placed in the right femoral vein. A Swan-Ganz catheter was used for the right heart catheterization. Standard protocol was followed for recording of right heart pressures and sampling of oxygen saturations. Fick cardiac output was calculated. Standard Judkins catheters were used for selective coronary angiography and left ventriculography. Aortic root angiography was performed. The aortic valve was crossed with an AL-1 catheter and a straight wire then changed out for a pigtail catheter. There were no immediate procedural complications. The patient was transferred to the post catheterization recovery area for further monitoring.     Estimated blood loss <50 mL.  During this procedure the patient was administered the following to achieve and maintain moderate conscious sedation: Versed 0.5 mg, Fentanyl 12.5 mcg, while the patient's heart rate, blood pressure, and oxygen saturation were continuously monitored. The period of conscious sedation was 43 minutes, of which I was present face-to-face 100% of this time.    Coronary Findings   Dominance: Right  Left Anterior Descending  The vessel exhibits minimal luminal irregularities.  Ramus  Intermedius  The vessel exhibits minimal luminal irregularities.  Left Circumflex  The vessel exhibits minimal luminal irregularities.  Right Coronary Artery  The vessel exhibits minimal luminal irregularities.  Right Heart   Right Heart Pressures Hemodynamic findings consistent with moderate pulmonary hypertension.    Wall Motion              Left Heart   Left Ventricle The left ventricular size is normal. The left ventricular systolic function is normal. LV end diastolic pressure is normal. The left ventricular ejection  fraction is 50-55% by visual estimate. No regional wall motion abnormalities. Low normal LV systolic function, LVEF [id number]    Aortic Valve There is severe aortic valve stenosis. There is no aortic valve regurgitation. The aortic valve is calcified. There is restricted aortic valve motion. The aortic valve is not congenitally bicuspid. Severe aortic stenosis with peak-to-peak gradient 46 mmHg, mean gradient 40 mmHg, and AVA 0.97 square cm    Coronary Diagrams   Diagnostic Diagram     Implants     No implant documentation for this case.  PACS Images   Show images for Cardiac catheterization   Link to Procedure Log   Procedure Log    Hemo Data   Flowsheet Row Most Recent Value  Fick Cardiac Output 6.59 L/min  Fick Cardiac Output Index 3.83 (L/min)/BSA  RA A Wave 11 mmHg  RA V Wave 10 mmHg  RA Mean 8 mmHg  RV Systolic Pressure 68 mmHg  RV Diastolic Pressure 3 mmHg  RV EDP 10 mmHg  PA Systolic Pressure 69 mmHg  PA Diastolic Pressure 22 mmHg  PA Mean 41 mmHg  PW A Wave 20 mmHg  PW V Wave 24 mmHg  PW Mean 17 mmHg  AO Systolic Pressure 123XX123 mmHg  AO Diastolic Pressure 70 mmHg  AO Mean 0000000 mmHg  LV Systolic Pressure 99991111 mmHg  LV Diastolic Pressure 12 mmHg  LV EDP 26 mmHg  Arterial Occlusion Pressure Extended Systolic Pressure [id number] mmHg  Arterial Occlusion Pressure Extended Diastolic Pressure 67 mmHg  Arterial Occlusion Pressure Extended Mean Pressure 103 mmHg  Left Ventricular Apex Extended Systolic Pressure 123456 mmHg  Left Ventricular Apex Extended Diastolic Pressure 20 mmHg  Left Ventricular Apex Extended EDP Pressure 30 mmHg  QP/QS 1  TPVR Index 10.7 HRUI  TSVR Index 28.44 HRUI  PVR SVR Ratio 0.16  TPVR/TSVR Ratio 0.38   ADDENDUM REPORT: 08/31/2016 17:45  CLINICAL DATA:  82 year old female with severe aortic stenosis.  EXAM: Cardiac TAVR CT  TECHNIQUE: The patient was scanned on a Philips 256 scanner. A 120 kV retrospective scan was triggered in the descending  thoracic aorta at 111 HU's. Gantry rotation speed was 270 msecs and collimation was .9 mm. No beta blockade or nitro were given. The 3D data set was reconstructed in 5% intervals of the R-R cycle. Systolic and diastolic phases were analyzed on a dedicated work station using MPR, MIP and VRT modes. The patient received 80 cc of contrast.  FINDINGS: Aortic Valve: Severely thickened and calcified aortic valve with severely restricted valve opening and mild symmetric calcifications extending into the LVOT.  Aorta: Normal size with mild diffuse calcifications and no dissection.  Sinotubular Junction:  31 x 31 mm  Ascending Thoracic Aorta:  34 x 33 mm  Aortic Arch:  31 x 26 mm  Descending Thoracic Aorta:  21 x 21 mm  Sinus of Valsalva Measurements:  Non-coronary:  32 mm  Right -coronary:  31 mm  Left -coronary:  34 mm  Coronary Artery Height above Annulus:  Left Main:  11 mm  Right Coronary:  14 mm  Virtual Basal Annulus Measurements:  Maximum/Minimum Diameter:  28 x 22 mm  Perimeter:  88 mm  Area:  458 mm2  Optimum Fluoroscopic Angle for Delivery:  LAO 13 CAU 13  IMPRESSION: 1. Severely thickened and calcified aortic valve with severely restricted valve opening and mild symmetric calcifications extending into the LVOT. Annular measurements suitable for delivery of a 26 mm Edwards-SAPIEN TAVR valve.  2. Sufficient annulus to coronary distance.  3. Optimum Fluoroscopic Angle for Delivery:  LAO 13 CAU 13  Ena Dawley   Electronically Signed   By: Ena Dawley   On: 08/31/2016 17:45   RISK SCORES About the STS Risk Calculator Procedure: AV Replacement  Risk of Mortality: 14.283%  Morbidity or Mortality: 55.181%  Long Length of Stay: 41.817%  Short Length of Stay: 3.353%  Permanent Stroke: 4.513%  Prolonged Ventilation: 48.927%  DSW Infection: 0.901%  Renal Failure: N/A  Reoperation: 19.158%    Impression:  This  82 year old woman has stage D severe symptomatic aortic stenosis with NYHA class III symptoms of exertional shortness of breath and hypotension during dialysis with two syncopal episodes about one year ago. I have personally reviewed her 2D echo, cath and CTA scans. Her echo shows a trileaflet aortic valve with severely thickened and calcified leaflets that have restricted mobility with a mean AV gradient of 46 mm Hg and normal LV systolic function. There is no mitral stenosis and only mild MR. Her cath shows non-obstructive disease with a mean AV gradient of 40 mm Hg and severe pulmonary hypertension that appears to be due to left heart disease. I think that AVR is indicated in this patient with the hope of improving her stamina and mobility and making her dialysis treatments smoother. The patient and her two sons were counseled at length regarding treatment alternatives for management of severe symptomatic aortic stenosis. Alternative approaches such as conventional aortic valve replacement, transcatheter aortic valve replacement, and palliative medical therapy were compared and contrasted at length. The risks associated with conventional surgical aortic valve replacement were been discussed in detail, as were expectations for post-operative convalescence. Long-term prognosis with medical therapy was discussed.  She would be at very high risk for open surgical AVR due to her age, deconditioning and reduced mobility, and comorbid factors of diabetes and dialysis-dependent renal failure. I think TAVR would be a reasonable alternative for her. Her gated cardiac CT showed anatomy favorable for a 26 mm Sapien 3 valve. There is mild calcification extending into the LVOT but I don't think that will cause any significant problem. Her abdominal and pelvic CT has been done and interpreted by me since the official radiology interpretation is not done yet. There is adequate pelvic arterial anatomy for transfemoral access.    I discussed transcatheter aortic valve replacement, what types of management strategies would be attempted intraoperatively in the event of life-threatening complications, including whether or not the patient would be considered a candidate for the use of cardiopulmonary bypass and/or conversion to open sternotomy for attempted surgical intervention.  The patient has been advised of a variety of complications that might develop including but not limited to risks of death, stroke, paravalvular leak, aortic dissection or other major vascular complications, aortic annulus rupture, device embolization, cardiac rupture or perforation, mitral regurgitation, acute myocardial infarction, arrhythmia, heart block or bradycardia requiring permanent pacemaker placement, congestive heart failure, respiratory failure,  renal failure, pneumonia, infection, other late complications related to structural valve deterioration or migration, or other complications that might ultimately cause a temporary or permanent loss of functional independence or other long term morbidity.  The patient provides full informed consent for the procedure as described and all questions were answered.   Plan:  She will return to see Dr. Roxy Manns for a second surgical evaluation and will have PFT's and a PT evaluation. She and her sons understand that the final decision to proceed with TAVR will depend on those evaluations. She would like to wait until after Christmas to proceed with TAVR so we will tentatively plan to do surgery in early January.   I spent 60 minutes performing this consultation and > 50% of this time was spent face to face counseling and coordinating the care of this patient's severe aortic stenosis.  Gaye Pollack, MD 08/31/2016

## 2016-09-03 ENCOUNTER — Other Ambulatory Visit: Payer: Self-pay | Admitting: Cardiovascular Disease

## 2016-09-03 DIAGNOSIS — Z789 Other specified health status: Secondary | ICD-10-CM

## 2016-09-04 ENCOUNTER — Encounter (HOSPITAL_COMMUNITY): Payer: Self-pay | Admitting: Interventional Radiology

## 2016-09-16 ENCOUNTER — Ambulatory Visit: Payer: Medicare Other | Attending: Cardiovascular Disease | Admitting: Physical Therapy

## 2016-09-16 ENCOUNTER — Encounter: Payer: Self-pay | Admitting: Physical Therapy

## 2016-09-16 ENCOUNTER — Encounter: Payer: Self-pay | Admitting: Thoracic Surgery (Cardiothoracic Vascular Surgery)

## 2016-09-16 ENCOUNTER — Institutional Professional Consult (permissible substitution) (INDEPENDENT_AMBULATORY_CARE_PROVIDER_SITE_OTHER): Payer: Medicare Other | Admitting: Thoracic Surgery (Cardiothoracic Vascular Surgery)

## 2016-09-16 ENCOUNTER — Encounter: Payer: Medicare Other | Admitting: Thoracic Surgery (Cardiothoracic Vascular Surgery)

## 2016-09-16 VITALS — BP 147/87 | HR 94 | Resp 20 | Ht 65.0 in | Wt 144.0 lb

## 2016-09-16 DIAGNOSIS — R293 Abnormal posture: Secondary | ICD-10-CM | POA: Insufficient documentation

## 2016-09-16 DIAGNOSIS — R2689 Other abnormalities of gait and mobility: Secondary | ICD-10-CM | POA: Insufficient documentation

## 2016-09-16 DIAGNOSIS — I350 Nonrheumatic aortic (valve) stenosis: Secondary | ICD-10-CM

## 2016-09-16 DIAGNOSIS — M6281 Muscle weakness (generalized): Secondary | ICD-10-CM | POA: Insufficient documentation

## 2016-09-16 NOTE — Therapy (Signed)
[place], [place], [address] Phone: [phone]   Fax:  [phone]  Physical Therapy Evaluation  Patient Details  Name: Angela Trujillo MRN: [id number] Date of Birth: 1931/07/14 Referring Provider: Dr. Sherren Mocha  Encounter Date: 09/16/2016      PT End of Session - 09/16/16 1506    Visit Number 1   PT Start Time 0213   PT Stop Time 0300   PT Time Calculation (min) 47 min   Equipment Utilized During Treatment Gait belt   Activity Tolerance Patient tolerated treatment well      Past Medical History:  Diagnosis Date  . Atrial flutter ([place])   . 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 ESRD (end stage renal disease) on dialysis ([place])   . Hypertension   . Severe aortic stenosis 08/19/2016    Past Surgical History:  Procedure Laterality Date  . ABDOMINAL HYSTERECTOMY    . CARDIAC CATHETERIZATION N/A 08/19/2016   Procedure: Right/Left Heart Cath and Coronary Angiography;  Surgeon: Sherren Mocha, MD;  Location: [facility];  Service: Cardiovascular;  Laterality: N/A;  . IR GENERIC HISTORICAL  08/31/2016   IR RADIOLOGY PERIPHERAL GUIDED IV START 08/31/2016 Corrie Mckusick, DO [facility]  . IR GENERIC HISTORICAL  08/31/2016   IR US GUIDE VASC ACCESS RIGHT 08/31/2016 Corrie Mckusick, DO [facility]  . TUBAL LIGATION      There were no vitals filed for this visit.       Subjective Assessment - 09/16/16 1421    Subjective MD found heart murmur at routine physical and thus discovered severe AS. Pt primary symptom has been feweling fatigued. No reports of increase SOB, dizziness, syncope, denies ankle swelling.    Patient is accompained by: Family member   Patient Stated Goals get stronger    Currently in Pain? No/denies            [facility] PT Assessment - 09/16/16 0001      Assessment   Medical Diagnosis severe aortic stenosis   Referring Provider Dr. Sherren Mocha    Onset Date/Surgical Date 08/31/16     Precautions   Precautions None   Precaution Comments NO BP RUE     Restrictions   Weight Bearing Restrictions No     Balance Screen   Has the patient fallen in the past 6 months No   Has the patient had a decrease in activity level because of a fear of falling?  No   Is the patient reluctant to leave their home because of a fear of falling?  No     Home Environment   Living Environment Private residence   Living Arrangements Alone  around the clock care by family   Home Access Stairs to enter   Entrance Stairs-Number of Steps 1   Entrance Stairs-Rails None  has assist as needed   [place] One level   Additional Comments Has used wheelchair in the community since March 2017 after DC from hospital/SNF with pneumonia.     Prior Function   Level of Independence Independent with household mobility without device;Independent with community mobility without device  up until hospitalization in 2017 for pneumonia     Posture/Postural Control   Posture/Postural Control Postural limitations   Postural Limitations Forward head;Rounded Shoulders     ROM / Strength   AROM / PROM / Strength AROM;Strength     AROM   Overall AROM Comments grossly [facility]  Strength   Overall Strength Comments grossly 4-/5 throughout   Strength Assessment Site Hand   Right/Left hand Right;Left   Right Hand Grip (lbs) 40  R hand dominant   Left Hand Grip (lbs) 35     Ambulation/Gait   Gait Pattern Poor foot clearance - left;Poor foot clearance - right;Decreased step length - right;Decreased step length - left;Trunk flexed;Narrow base of support          [facility] Pre-Surgical Assessment - 09/16/16 0001    5 Meter Walk Test- trial 1 8 sec   5 Meter Walk Test- trial 2 7 sec.    5 Meter Walk Test- trial 3 6 sec.  >6 sec indicates slow speed   5 meter walk test average 7 sec   4 Stage Balance Test tolerated for:  1 sec.   4 Stage Balance Test Position 4    comment not indicative of high fall risk   Sit To Stand Test- trial 1 0 sec.   Comment unable without UE support   ADL/IADL Independent with: Bathing;Dressing;Finances   ADL/IADL Needs Assistance with: Meal prep;Yard work   ADL/IADL Therapist, sports Index Midly frail   6 Minute Walk- Baseline yes   BP (mmHg) 149/78   HR (bpm) 88   02 Sat (%RA) 94 %   Modified Borg Scale for Dyspnea 0- Nothing at all   Perceived Rate of Exertion (Borg) 6-   6 Minute Walk Post Test yes   BP (mmHg) 166/90   HR (bpm) 117   02 Sat (%RA) 94 %   Modified Borg Scale for Dyspnea 2- Mild shortness of breath   Perceived Rate of Exertion (Borg) 11- Fairly light   Aerobic Endurance Distance Walked 855   Endurance additional comments 1 seated rest break required at 4:22 x 50 seconds due to fatigue/SOB.                                     Plan - 09/16/16 1506    Clinical Impression Statement see below   PT Frequency One time visit   Consulted and Agree with Plan of Care Patient;Family member/caregiver     Clinical Impression Statement: Pt is an 82 yo female presenting to OP PT for evaluation prior to possible TAVR surgery due to severe aortic stenosis. Pt/sons report discovery of heart murmur in recent months and subsequent testing with severe aortic stenosis diagnosis. Pt has been in wheelchair for community since DC from SNF in May. Symptoms are limiting general endurance and primary symptom is fatigue. Pt presents with good ROM, fair strength, good balance and is not at high fall risk 4 stage balance test, slow walking speed without device and fair to poor aerobic endurance per 6 minute walk test. Pt ambulated 740 feet in 4:22 before requesting a seated rest beak lasting 50 seconds. At time of rest, patient's HR was 117 bpm and O2 was 94 on room air. Pt reported 2/10 shortness of breath on modified scale for dyspnea. Pt able to resume after rest and ambulate an additional 115 feet. Pt ambulated  a total of 855 feet in 6 minute walk. BP increased significantly with 6 minute walk test. Based on the Short Physical Performance Battery, patient has a frailty rating of 7/12 with </= 5/12 considered frail.   Patient will benefit from skilled therapeutic intervention in order to improve the following deficits and impairments:     Visit  Diagnosis: Other abnormalities of gait and mobility  Abnormal posture  Muscle weakness (generalized)      G-Codes - September 25, 2016 1507    Functional Assessment Tool Used 6 miute walk 855'   Functional Limitation Mobility: Walking and moving around   Mobility: Walking and Moving Around Current Status [phone]) At least 20 percent but less than 40 percent impaired, limited or restricted   Mobility: Walking and Moving Around Goal Status [phone]) At least 20 percent but less than 40 percent impaired, limited or restricted   Mobility: Walking and Moving Around Discharge Status [phone]) At least 20 percent but less than 40 percent impaired, limited or restricted       Problem List Patient Active Problem List   Diagnosis Date Noted  . Severe aortic stenosis 08/19/2016  . Atrial flutter ([place])   . Anemia 04/15/2013  . Acute renal failure ([place]) 04/15/2013  . HTN (hypertension) 04/15/2013  . DM2 (diabetes mellitus, type 2) ([place]) 04/15/2013    [place], PT 2016/09/25, 3:08 PM  [facility] [address] [place], [place], [address] Phone: [phone]   Fax:  [phone]  Name: Roseline Barquin MRN: [id number] Date of Birth: 30-Jul-1931

## 2016-09-16 NOTE — Patient Instructions (Signed)
Continue all previous medications without any changes at this time  

## 2016-09-16 NOTE — Progress Notes (Addendum)
HEART AND [place] SURGERY CONSULTATION REPORT  Referring Provider is Sherren Mocha, MD PCP is Doretha Sou, MD  Chief Complaint  Patient presents with  . Aortic Stenosis    2nd TAVR eval, review all studies, surgery is scheduled for 09/29/2016    HPI:  Patient is an 82 year old African-American female with history of aortic stenosis, hypertension, type 2 diabetes mellitus with complications, and end-stage renal disease for which the patient is dialysis dependent who has been referred for second surgical opinion to discuss treatment options for management of severe symptomatic aortic stenosis. The patient has reportedly known to have a heart murmur for many years but never underwent formal cardiology evaluation until recently. In February 2017 she was hospitalized with pneumonia and respiratory failure at [facility]. [facility] took her more than 1 month to recover. She was discharged initially to a skilled nursing facility and ultimately discharged home last May. Ever since then she has complained of exertional shortness of breath. An echocardiogram was performed in October of this year and revealed low normal left ventricular systolic function with ejection fraction estimated 50-55%, moderate concentric left ventricular hypertrophy, mild to moderate mitral regurgitation, moderate tricuspid regurgitation, and moderate to severe pulmonary hypertension. There was evidence consistent with severe aortic stenosis with peak and mean transvalvular gradients estimated 79 and 51 mmHg, respectively. The patient was referred to Dr. Burt Knack who saw her in consultation on 08/07/2016. The patient subsequently underwent repeat echocardiogram and diagnostic cardiac catheterization.  Echocardiogram performed 08/19/2016 revealed essentially normal left ventricular  systolic function with ejection fraction estimated 55-60%. There was severe calcification with severely restricted leaflet mobility involving all 3 leaflets of the aortic valve. Peak velocity across the aortic valve was reported 4.3 m/s corresponding to mean transvalvular gradient estimated 46 mmHg. There was mild mitral regurgitation and moderate pulmonary hypertension and mild tricuspid regurgitation.  Diagnostic cardiac catheterization performed 08/19/2016 revealed minimal nonobstructive coronary artery disease and confirms the presence of severe calcific aortic stenosis with mean transvalvular gradient measured 40 mmHg by catheterization. There was severe pulmonary hypertension. The patient subsequently underwent CT angiography and was referred for surgical consultation. She has been evaluated previously by Dr. Cyndia Bent and felt to be at relatively high risk for conventional surgical aortic valve replacement. Transcatheter aortic valve replacement has been discussed as an alternative, and the patient has been referred for a second opinion.  The patient is widowed and lives alone in [place]. She has a total of 5 adult children. Two of her sons live close by and are very supportive. At least one of them stays with her essentially all of the time.  The patient is not very active physically. She uses a walker or a cane for limited mobility around the house. When she goes out one of her sons always accompanies her and oftentimes they will take a wheelchair.  The patient claims that her biggest limitation is that of exertional shortness of breath and fatigue. She gets short of breath quite easily but she denies any symptoms of resting shortness breath, PND, orthopnea, or lower extremity edema. She has not had chest pain or chest tightness either with activity or at rest. She has not had dizzy spells or syncope. She has had some problems with her blood pressure dropping during dialysis treatments. She dialyzes on a  Tuesday Thursday Saturday schedule at the dialysis Center in [facility] on [address] through a right upper arm AV graft.  Past Medical History:  Diagnosis Date  . Atrial flutter ([place])   . Diabetes mellitus with end stage renal disease ([place])   . ESRD (end stage renal disease) on dialysis ([place])   . Hypertension   . Severe aortic stenosis 08/19/2016    Past Surgical History:  Procedure Laterality Date  . ABDOMINAL HYSTERECTOMY    . CARDIAC CATHETERIZATION N/A 08/19/2016   Procedure: Right/Left Heart Cath and Coronary Angiography;  Surgeon: Sherren Mocha, MD;  Location: [facility];  Service: Cardiovascular;  Laterality: N/A;  . IR GENERIC HISTORICAL  08/31/2016   IR RADIOLOGY PERIPHERAL GUIDED IV START 08/31/2016 Corrie Mckusick, DO [facility]  . IR GENERIC HISTORICAL  08/31/2016   IR US GUIDE VASC ACCESS RIGHT 08/31/2016 Corrie Mckusick, DO [facility]  . TUBAL LIGATION      History reviewed. No pertinent family history.  Social History   Social History  . Marital status: Widowed    Spouse name: N/A  . Number of children: N/A  . Years of education: N/A   Occupational History  . Not on file.   Social History Main Topics  . Smoking status: Never Smoker  . Smokeless tobacco: Never Used  . Alcohol use No  . Drug use: Unknown  . Sexual activity: No   Other Topics Concern  . Not on file   Social History Narrative  . No narrative on file    Current Outpatient Prescriptions  Medication Sig Dispense Refill  . atorvastatin (LIPITOR) 20 MG tablet Take 20 mg by mouth at bedtime.    . calcium acetate (PHOSLO) 667 MG capsule Take 667 mg by mouth daily with breakfast.    . famotidine (PEPCID) 20 MG tablet Take 20 mg by mouth 2 (two) times daily.    . Insulin Detemir (LEVEMIR FLEXTOUCH) 100 UNIT/ML Pen Inject 14 Units into the skin 2 (two) times daily.    . metoprolol succinate (TOPROL-XL) 100 MG 24 hr tablet Take 100 mg by mouth daily.    . SENSIPAR 30 MG  tablet Take 30 mg by mouth See admin instructions. The son believes they give this to her at dialysis on Tue, Thur, Sat.     No current facility-administered medications for this visit.     Allergies  Allergen Reactions  . Asa [Aspirin] Nausea Only    Makes stomach hurt also  . Codeine Rash  . Penicillins Rash    Has patient had a PCN reaction causing immediate rash, facial/tongue/throat swelling, SOB or lightheadedness with hypotension: No Has patient had a PCN reaction causing severe rash involving mucus membranes or skin necrosis: No Has patient had a PCN reaction that required hospitalization: No Has patient had a PCN reaction occurring within the last 10 years: No If all of the above answers are "NO", then may proceed with Cephalosporin use.       Review of Systems:   General:  normal appetite, decreased energy, + weight gain, no weight loss, no fever  Cardiac:  no chest pain with exertion, no chest pain at rest, +SOB with exertion, no resting SOB, no PND, no orthopnea, no palpitations, no arrhythmia, no atrial fibrillation, + LE edema, no dizzy spells, no syncope  Respiratory:  + shortness of breath, no home oxygen, no productive cough, no dry cough, no bronchitis, no wheezing, no hemoptysis, no asthma, no pain with inspiration or cough, no sleep apnea, no CPAP at night  GI:   no  difficulty swallowing, no reflux, no frequent heartburn, no hiatal hernia, no abdominal pain, no constipation, + diarrhea, no hematochezia, no hematemesis, no melena  GU:   Occasionally makes small amount of urine, no dysuria,  no frequency, no urinary tract infection, no hematuria, no kidney stones, + chronic kidney disease  Vascular:  no pain suggestive of claudication, no pain in feet, no leg cramps, no varicose veins, no DVT, no non-healing foot ulcer  Neuro:   no stroke, no TIA's, no seizures, no headaches, no temporary blindness one eye,  no slurred speech, no peripheral neuropathy, no chronic pain,  + instability of gait, no memory/cognitive dysfunction  Musculoskeletal: + arthritis, no joint swelling, no myalgias, + some difficulty walking, limited mobility   Skin:   no rash, no itching, no skin infections, no pressure sores or ulcerations  Psych:   no anxiety, no depression, no nervousness, no unusual recent stress  Eyes:   no blurry vision, no floaters, no recent vision changes, + wears glasses or contacts  ENT:   no hearing loss, no loose or painful teeth, + partial upper dentures, last saw dentist several years ago  Hematologic:  no easy bruising, no abnormal bleeding, no clotting disorder, no frequent epistaxis  Endocrine:  + diabetes, does check CBG's at home           Physical Exam:   BP (!) 147/87   Pulse 94   Resp 20   Ht 5\' 5"  (1.651 m)   Wt 144 lb (65.3 kg)   SpO2 92%   BMI 23.96 kg/m   General:  Elderly and frail-appearing  HEENT:  Unremarkable   Neck:   no JVD, no bruits, no adenopathy   Chest:   clear to auscultation, symmetrical breath sounds, no wheezes, no rhonchi   CV:   RRR, grade IV/VI crescendo/decrescendo murmur heard best at RUSB,  no diastolic murmur  Abdomen:  soft, non-tender, no masses   Extremities:  warm, well-perfused, pulses not palpable, no LE edema  Rectal/GU  Deferred  Neuro:   Grossly non-focal and symmetrical throughout  Skin:   Clean and dry, no rashes, no breakdown   Diagnostic Tests:  Transthoracic Echocardiography  Patient:    Esmeraldo, Mayhall MR #:       [id number] Study Date: 08/19/2016 Gender:     F Age:        36 Height:     165.1 cm Weight:     65.3 kg BSA:        1.74 m^2 Pt. Status: Room:   ADMITTING    Sherren Mocha, MD  ATTENDING    Sherren Mocha, MD  ORDERING     Sherren Mocha, MD  REFERRING    Sherren Mocha, MD  PERFORMING   [facility], Inpatient  SONOGRAPHER  [facility]  cc:  ------------------------------------------------------------------- LV EF: 55% -    60%  ------------------------------------------------------------------- Indications:      Aortic stenosis 424.1.  ------------------------------------------------------------------- History:   PMH:   Atrial flutter.  Risk factors:  Hypertension. Diabetes mellitus.  ------------------------------------------------------------------- Study Conclusions  - Left ventricle: The cavity size was normal. There was mild   concentric hypertrophy. Systolic function was normal. The   estimated ejection fraction was in the range of 55% to 60%. Wall   motion was normal; there were no regional wall motion   abnormalities. Features are consistent with a pseudonormal left   ventricular filling pattern, with concomitant abnormal relaxation   and increased filling pressure (grade 2 diastolic dysfunction).  Doppler parameters are consistent with elevated ventricular   end-diastolic filling pressure. - Aortic valve: Trileaflet; severely thickened, severely calcified   leaflets. Valve mobility was restricted. There was severe   stenosis. Mean gradient (S): 46 mm Hg. Peak gradient (S): 76 mm   Hg. - Aortic root: The aortic root was normal in size. - Ascending aorta: The ascending aorta was normal in size. - Mitral valve: Mildly thickened, mildly calcified leaflets .   Mobility was restricted. There was mild regurgitation. - Left atrium: The atrium was moderately dilated. - Right ventricle: Systolic function was normal. - Right atrium: The atrium was mildly dilated. - Tricuspid valve: There was moderate regurgitation. - Pulmonic valve: There was no regurgitation. - Pulmonary arteries: Systolic pressure was moderately increased.   PA peak pressure: 48 mm Hg (S). - Inferior vena cava: The vessel was normal in size. - Pericardium, extracardiac: There was no pericardial effusion.  Impressions:  - Severe aortic stenosis. Moderate pulmonary  hypertension.  ------------------------------------------------------------------- Study data:  No prior study was available for comparison.  Study status:  Routine.  Procedure:  The patient reported no pain pre or post test. Transthoracic echocardiography. Image quality was adequate.  Study completion:  There were no complications. Transthoracic echocardiography.  M-mode, complete 2D, spectral Doppler, and color Doppler.  Birthdate:  Patient birthdate: 1931-01-06.  Age:  Patient is 82 yr old.  Sex:  Gender: female. BMI: 24 kg/m^2.  Blood pressure:     164/75  Patient status: Inpatient.  Study date:  Study date: 08/19/2016. Study time: 05:25 PM.  Location:  Bedside.  -------------------------------------------------------------------  ------------------------------------------------------------------- Left ventricle:  The cavity size was normal. There was mild concentric hypertrophy. Systolic function was normal. The estimated ejection fraction was in the range of 55% to 60%. Wall motion was normal; there were no regional wall motion abnormalities. Features are consistent with a pseudonormal left ventricular filling pattern, with concomitant abnormal relaxation and increased filling pressure (grade 2 diastolic dysfunction). Doppler parameters are consistent with elevated ventricular end-diastolic filling pressure.  ------------------------------------------------------------------- Aortic valve:   Trileaflet; severely thickened, severely calcified leaflets. Valve mobility was restricted.  Doppler:   There was severe stenosis.   There was no regurgitation.    Mean gradient (S): 46 mm Hg. Peak gradient (S): 76 mm Hg.  ------------------------------------------------------------------- Aorta:  Aortic root: The aortic root was normal in size. Ascending aorta: The ascending aorta was normal in size.  ------------------------------------------------------------------- Mitral  valve:   Mildly thickened, mildly calcified leaflets . Mobility was restricted.  Doppler:  Transvalvular velocity was within the normal range. There was no evidence for stenosis. There was mild regurgitation.    Peak gradient (D): 7 mm Hg.  ------------------------------------------------------------------- Left atrium:  The atrium was moderately dilated.  ------------------------------------------------------------------- Right ventricle:  The cavity size was normal. Wall thickness was normal. Systolic function was normal.  ------------------------------------------------------------------- Pulmonic valve:    Structurally normal valve.   Cusp separation was normal.  Doppler:  Transvalvular velocity was within the normal range. There was no evidence for stenosis. There was no regurgitation.  ------------------------------------------------------------------- Tricuspid valve:   Structurally normal valve.    Doppler: Transvalvular velocity was within the normal range. There was moderate regurgitation.  ------------------------------------------------------------------- Pulmonary artery:   The main pulmonary artery was normal-sized. Systolic pressure was moderately increased.  ------------------------------------------------------------------- Right atrium:  The atrium was mildly dilated.  ------------------------------------------------------------------- Pericardium:  There was no pericardial effusion.  ------------------------------------------------------------------- Systemic veins: Inferior vena cava: The vessel was normal in size.  ------------------------------------------------------------------- Measurements  Left ventricle                         Value        Reference  LV ID, ED, PLAX chordal                46.3  mm     43 - 52  LV ID, ES, PLAX chordal                32.7  mm     23 - 38  LV fx shortening, PLAX chordal         29    %      >=29  LV PW  thickness, ED                    15.1  mm     ---------  IVS/LV PW ratio, ED                    0.72         <=1.3  LV e&', lateral                         7.82  cm/s   ---------  LV E/e&', lateral                       17.26        ---------  LV e&', medial                          4.05  cm/s   ---------  LV E/e&', medial                        33.33        ---------  LV e&', average                         5.94  cm/s   ---------  LV E/e&', average                       22.75        ---------    Ventricular septum                     Value        Reference  IVS thickness, ED                      10.8  mm     ---------    LVOT                                   Value        Reference  LVOT ID, S                             22    mm     ---------  LVOT area                              3.8   cm^2   ---------    Aortic valve  Value        Reference  Aortic valve peak velocity, S          435   cm/s   ---------  Aortic valve mean velocity, S          322   cm/s   ---------  Aortic valve VTI, S                    107   cm     ---------  Aortic mean gradient, S                46    mm Hg  ---------  Aortic peak gradient, S                76    mm Hg  ---------    Aorta                                  Value        Reference  Aortic root ID, ED                     31    mm     ---------    Left atrium                            Value        Reference  LA ID, A-P, ES                         42    mm     ---------  LA ID/bsa, A-P                 (H)     2.42  cm/m^2 <=2.2  LA volume, S                           126   ml     ---------  LA volume/bsa, S                       72.5  ml/m^2 ---------  LA volume, ES, 1-p A4C                 112   ml     ---------  LA volume/bsa, ES, 1-p A4C             64.4  ml/m^2 ---------  LA volume, ES, 1-p A2C                 133   ml     ---------  LA volume/bsa, ES, 1-p A2C             76.5  ml/m^2 ---------    Mitral valve                            Value        Reference  Mitral E-wave peak velocity            135   cm/s   ---------  Mitral A-wave peak velocity            124   cm/s   ---------  Mitral deceleration time  208   ms     150 - 230  Mitral peak gradient, D                7     mm Hg  ---------  Mitral E/A ratio, peak                 1.1          ---------    Pulmonary arteries                     Value        Reference  PA pressure, S, DP             (H)     48    mm Hg  <=30    Tricuspid valve                        Value        Reference  Tricuspid regurg peak velocity         335   cm/s   ---------  Tricuspid peak RV-RA gradient          45    mm Hg  ---------    Systemic veins                         Value        Reference  Estimated CVP                          3     mm Hg  ---------    Right ventricle                        Value        Reference  TAPSE                                  24.4  mm     ---------  RV pressure, S, DP             (H)     48    mm Hg  <=30  RV s&', lateral, S                      10.1  cm/s   ---------  Legend: (L)  and  (H)  mark values outside specified reference range.  ------------------------------------------------------------------- Prepared and Electronically Authenticated by  Ena Dawley, M.D. 2017-11-30T14:11:49   Right/Left Heart Cath and Coronary Angiography  Conclusion   1. Patent coronary arteries with minimal nonobstructive CAD 2. Severe calcific aortic stenosis with mean gradient 40 mmHg and AVA calculated 0.97 square cm 3. Severe pulmonary HTN likely secondary to left heart disease (PVR 2 [place] Units)  Plan: Continued evaluation for TAVR.   Indications   Severe aortic stenosis [I35.0 (ICD-10-CM)]  Procedural Details/Technique   Technical Details INDICATION: Severe symptomatic aortic stenosis (Stage D)  PROCEDURAL DETAILS: The right groin was prepped, draped, and anesthetized with 1% lidocaine. Using the modified  Seldinger technique a 5 French sheath was placed in the right femoral artery and a 7 French sheath was placed in the right femoral vein. A Swan-Ganz catheter was used for the right heart catheterization. Standard protocol was followed for recording of right heart  pressures and sampling of oxygen saturations. Fick cardiac output was calculated. Standard Judkins catheters were used for selective coronary angiography and left ventriculography. Aortic root angiography was performed. The aortic valve was crossed with an AL-1 catheter and a straight wire then changed out for a pigtail catheter. There were no immediate procedural complications. The patient was transferred to the post catheterization recovery area for further monitoring.     Estimated blood loss <50 mL.  During this procedure the patient was administered the following to achieve and maintain moderate conscious sedation: Versed 0.5 mg, Fentanyl 12.5 mcg, while the patient's heart rate, blood pressure, and oxygen saturation were continuously monitored. The period of conscious sedation was 43 minutes, of which I was present face-to-face 100% of this time.    Coronary Findings   Dominance: Right  Left Anterior Descending  The vessel exhibits minimal luminal irregularities.  Ramus Intermedius  The vessel exhibits minimal luminal irregularities.  Left Circumflex  The vessel exhibits minimal luminal irregularities.  Right Coronary Artery  The vessel exhibits minimal luminal irregularities.  Right Heart   Right Heart Pressures Hemodynamic findings consistent with moderate pulmonary hypertension.    Wall Motion              Left Heart   Left Ventricle The left ventricular size is normal. The left ventricular systolic function is normal. LV end diastolic pressure is normal. The left ventricular ejection fraction is 50-55% by visual estimate. No regional wall motion abnormalities. Low normal LV systolic function, LVEF [id number]    Aortic  Valve There is severe aortic valve stenosis. There is no aortic valve regurgitation. The aortic valve is calcified. There is restricted aortic valve motion. The aortic valve is not congenitally bicuspid. Severe aortic stenosis with peak-to-peak gradient 46 mmHg, mean gradient 40 mmHg, and AVA 0.97 square cm    Coronary Diagrams   Diagnostic Diagram     Implants     No implant documentation for this case.  PACS Images   Show images for Cardiac catheterization   Link to Procedure Log   Procedure Log    Hemo Data   Flowsheet Row Most Recent Value  Fick Cardiac Output 6.59 L/min  Fick Cardiac Output Index 3.83 (L/min)/BSA  RA A Wave 11 mmHg  RA V Wave 10 mmHg  RA Mean 8 mmHg  RV Systolic Pressure 68 mmHg  RV Diastolic Pressure 3 mmHg  RV EDP 10 mmHg  PA Systolic Pressure 69 mmHg  PA Diastolic Pressure 22 mmHg  PA Mean 41 mmHg  PW A Wave 20 mmHg  PW V Wave 24 mmHg  PW Mean 17 mmHg  AO Systolic Pressure 123XX123 mmHg  AO Diastolic Pressure 70 mmHg  AO Mean 0000000 mmHg  LV Systolic Pressure 99991111 mmHg  LV Diastolic Pressure 12 mmHg  LV EDP 26 mmHg  Arterial Occlusion Pressure Extended Systolic Pressure [id number] mmHg  Arterial Occlusion Pressure Extended Diastolic Pressure 67 mmHg  Arterial Occlusion Pressure Extended Mean Pressure 103 mmHg  Left Ventricular Apex Extended Systolic Pressure 123456 mmHg  Left Ventricular Apex Extended Diastolic Pressure 20 mmHg  Left Ventricular Apex Extended EDP Pressure 30 mmHg  QP/QS 1  TPVR Index 10.7 HRUI  TSVR Index 28.44 HRUI  PVR SVR Ratio 0.16  TPVR/TSVR Ratio 0.38    Cardiac TAVR CT  TECHNIQUE: The patient was scanned on a Philips 256 scanner. A 120 kV retrospective scan was triggered in the descending thoracic aorta at 111 HU's. Gantry rotation speed was 270 msecs and  collimation was .9 mm. No beta blockade or nitro were given. The 3D data set was reconstructed in 5% intervals of the R-R cycle. Systolic and diastolic phases were analyzed  on a dedicated work station using MPR, MIP and VRT modes. The patient received 80 cc of contrast.  FINDINGS: Aortic Valve: Severely thickened and calcified aortic valve with severely restricted valve opening and mild symmetric calcifications extending into the LVOT.  Aorta: Normal size with mild diffuse calcifications and no dissection.  Sinotubular Junction:  31 x 31 mm  Ascending Thoracic Aorta:  34 x 33 mm  Aortic Arch:  31 x 26 mm  Descending Thoracic Aorta:  21 x 21 mm  Sinus of Valsalva Measurements:  Non-coronary:  32 mm  Right -coronary:  31 mm  Left -coronary:  34 mm  Coronary Artery Height above Annulus:  Left Main:  11 mm  Right Coronary:  14 mm  Virtual Basal Annulus Measurements:  Maximum/Minimum Diameter:  28 x 22 mm  Perimeter:  88 mm  Area:  458 mm2  Optimum Fluoroscopic Angle for Delivery:  LAO 13 CAU 13  IMPRESSION: 1. Severely thickened and calcified aortic valve with severely restricted valve opening and mild symmetric calcifications extending into the LVOT. Annular measurements suitable for delivery of a 26 mm Edwards-SAPIEN TAVR valve.  2. Sufficient annulus to coronary distance.  3. Optimum Fluoroscopic Angle for Delivery:  LAO 13 CAU 13  Ena Dawley   Electronically Signed   By: Ena Dawley   On: 08/31/2016 17:45    CT ANGIOGRAPHY CHEST, ABDOMEN AND PELVIS  TECHNIQUE: Multidetector CT imaging through the chest, abdomen and pelvis was performed using the standard protocol during bolus administration of intravenous contrast. Multiplanar reconstructed images and MIPs were obtained and reviewed to evaluate the vascular anatomy.  CONTRAST:  75 mL of Isovue 370.  COMPARISON:  No priors.  FINDINGS: CTA CHEST FINDINGS  Cardiovascular: Heart size is mildly enlarged with some concentric left ventricular hypertrophy. Severe thickening calcification of the aortic valve. There is no  significant pericardial fluid, thickening or pericardial calcification. Atherosclerosis of the thoracic aorta and the great vessels of the mediastinum.  Mediastinum/Lymph Nodes: Numerous borderline enlarged and mildly enlarged mediastinal and hilar lymph nodes are noted on today's examination, measuring up to 12 mm in short axis in the low right paratracheal and right hilar nodal stations. Esophagus is unremarkable in appearance. No axillary lymphadenopathy.  Lungs/Pleura: Scattered areas of linear scarring are noted in the lungs bilaterally, most evident in the dependent portions of the left lower lobe. No confluent consolidative airspace disease. No pleural effusions. 9 x 5 mm (mean diameter of 7 mm) subpleural nodule in the posterior aspect of the right lower lobe (image 32 of series 407). No other larger suspicious appearing pulmonary nodules or masses are noted.  Musculoskeletal/Soft Tissues: There are no aggressive appearing lytic or blastic lesions noted in the visualized portions of the skeleton.  CTA ABDOMEN AND PELVIS FINDINGS  Hepatobiliary: The liver has a slightly shrunken appearance and nodular contour, suggesting underlying cirrhosis. No discrete cystic or solid hepatic lesions are noted. No intra or extrahepatic biliary ductal dilatation. Several densely calcified gallstones are noted lying dependently in the gallbladder neck.  Pancreas: In the head and proximal body of the pancreas there are 2 well-defined low-attenuation areas measuring up to 12 x 25 mm (image 160 of series 401), which are incompletely characterized, but favored to represent pancreatic pseudocysts. Throughout the distal body and tail of the pancreas there  are innumerable cystic appearing areas, which is favored to reflect extreme side branch ectasia. Atrophy of the pancreatic parenchyma throughout the distal body and tail of the pancreas. Notably, the intervening pancreatic duct is normal  in caliber in the proximal body and head of the pancreas. No peripancreatic inflammatory changes.  Spleen: Unremarkable.  Adrenals/Urinary Tract: Mild bilateral adrenal gland thickening. Mild parenchymal atrophy in both kidneys. Nonobstructive calculi in the collecting systems of the kidneys bilaterally, measuring up to 5 mm in the interpolar collecting system of the right kidney. Subcentimeter low-attenuation lesions in the kidneys bilaterally, too small to characterize, but statistically likely tiny cysts. No hydroureteronephrosis. Urinary bladder is nearly completely decompressed, with a small amount of gas non dependently. No indwelling Foley catheter.  Stomach/Bowel: The appearance of the stomach is normal. There is no pathologic dilatation of small bowel or colon. Numerous colonic diverticulae are noted throughout the colon, without surrounding inflammatory changes to suggest an acute diverticulitis at this time. The appendix is not confidently identified and may be surgically absent. Regardless, there are no inflammatory changes noted adjacent to the cecum to suggest the presence of an acute appendicitis at this time.  Vascular/Lymphatic: Aortic atherosclerosis, with vascular findings and measurements pertinent to potential TAVR procedure, as detailed below. No aneurysm or dissection is identified in the abdominal or pelvic vasculature. Separate origin of the common hepatic artery and the splenic artery/left gastric artery (normal anatomical variant) incidentally noted. All mesenteric branches appear widely patent, without hemodynamically significant stenosis. Single renal arteries bilaterally with prominent atherosclerotic plaques at their origins, which appear nonobstructive. No lymphadenopathy noted in the abdomen or pelvis.  Reproductive: Status post hysterectomy.  Ovaries are atrophic.  Other: No significant volume of ascites.  No  pneumoperitoneum.  Musculoskeletal: There are no aggressive appearing lytic or blastic lesions noted in the visualized portions of the skeleton.  VASCULAR MEASUREMENTS PERTINENT TO TAVR:  AORTA:  Minimal Aortic Diameter -  14 x 12 mm  Severity of Aortic Calcification -  mild  RIGHT PELVIS:  Right Common Iliac Artery -  Minimal Diameter - 10.9 x 10.3 mm  Tortuosity - mild  Calcification - mild  Right External Iliac Artery -  Minimal Diameter - 8.1 x 7.7 mm  Tortuosity - severe  Calcification - none  Right Common Femoral Artery -  Minimal Diameter - 7.1 x 8.5 mm  Tortuosity - mild  Calcification - mild  LEFT PELVIS:  Left Common Iliac Artery -  Minimal Diameter - 9.6 x 10.0 mm  Tortuosity - mild  Calcification - mild  Left External Iliac Artery -  Minimal Diameter - 8.0 x 6.8 mm  Tortuosity - moderate  Calcification - none  Left Common Femoral Artery -  Minimal Diameter - 7.3 x 6.7 mm  Tortuosity - mild  Calcification - mild  Review of the MIP images confirms the above findings.  IMPRESSION: 1. Vascular findings and measurements pertinent to potential TAVR procedure, as detailed above. This patient appears to have suitable pelvic arterial access bilaterally. 2. Severe thickening and calcification of the aortic valve, compatible with the reported clinical history of severe aortic stenosis. 3. Highly unusual appearance of the pancreas. 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 Repeat evaluation with MRI of the abdomen with and without IV gadolinium  is recommended in 3-6 months to ensure the stability of these findings. 4. Several borderline enlarged and mildly enlarged right  hilar and mediastinal lymph nodes, as above are nonspecific. In addition, there is a 7 mm subpleural nodule in the posterior aspect of the right lower lobe (image 32 of series 407). Non-contrast chest CT at 6-12 months is recommended. If the nodule is stable at time of repeat CT, then future CT at 18-24 months (from today's scan) is considered optional for low-risk patients, but is recommended for high-risk patients. This recommendation follows the consensus statement: Guidelines for Management of Incidental Pulmonary Nodules Detected on CT Images: From the Fleischner Society 2017; Radiology 2017; 284:228-243. 5. Small nonobstructive calculi in the collecting systems of the kidneys bilaterally measuring up to 5 mm in the interpolar collecting system of the right kidney. No ureteral stones or findings of urinary tract obstruction are noted at this time. 6. Small amount of gas non dependently in the lumen of the urinary bladder. This is nonspecific, but is typically seen in the setting of recent catheterization. Clinical correlation is recommended. If there has been no recent history of catheterization, correlation with urinalysis would be recommended to exclude the possibility of infection with gas-forming organisms. 7. Severe colonic diverticulosis without evidence of acute diverticulitis at this time. 8. Cardiomegaly with concentric left ventricular hypertrophy. 9. Additional incidental findings, as above.   Electronically Signed   By: Vinnie Langton M.D.   On: 09/01/2016 12:57   STS Risk Calculator  Procedure    AVR  Risk of Mortality   16.6% Morbidity or Mortality  53.3% Prolonged LOS   41.7% Short LOS    3.0% Permanent Stroke   4.8% Prolonged Vent Support  49.8% DSW Infection    0.9% Renal Failure    n/a% Reoperation    19.4%   Impression:  Patient has stage D severe symptomatic aortic stenosis. She presents with stable symptoms of exertional shortness of  breath and fatigue consistent with chronic diastolic congestive heart failure, New York Heart Association functional class III. Symptoms are unquestionably multifactorial given her numerous comorbid medical problems including long-standing hypertension and dialysis dependent renal failure. I have personally reviewed the patient's recent transthoracic echocardiogram, diagnostic cardiac catheterization, and CT angiograms. Transthoracic echocardiogram demonstrates severe thickening, calcification, and restricted leaflet mobility involving all 3 leaflets of the patient's aortic valve. Peak velocity across the aortic valve measured well above 4 m/s corresponding to mean transvalvular gradient ranging between 45 and 50 mmHg. Left ventricular systolic function remains preserved. Diagnostic cardiac catheterization confirmed the presence of severe aortic stenosis and was notable for the absence of significant coronary artery disease. The patient does have moderate to severe pulmonary hypertension and likely significant diastolic dysfunction. I agree the patient would benefit from aortic valve replacement. Risks associated with conventional surgery would be very high in this elderly patient with dialysis-dependent renal failure. I would not consider this patient a candidate for conventional surgery. Cardiac gated CT angiogram of the heart confirms findings consistent with severe aortic stenosis with anatomical characteristics suitable for transcatheter aortic valve replacement without any significant complicating features. CT angiogram of the aorta and iliac vessels demonstrates adequate pelvic vascular access to facilitate a transfemoral approach. I agree that transcatheter aortic valve replacement would be the best option for this patient.   Plan:  The patient and 2 of her sons were counseled at length regarding treatment alternatives for management of severe symptomatic aortic stenosis. Alternative approaches such as  conventional aortic valve  replacement, transcatheter aortic valve replacement, and palliative medical therapy were compared and contrasted at length.  The risks associated with conventional surgical aortic valve replacement were been discussed in detail, as were reasons why I would be reluctant to consider conventional surgery in this patient. Long-term prognosis with medical therapy was discussed. This discussion was placed in the context of the patient's own specific clinical presentation and past medical history.  All of their questions been addressed.  The patient hopes to proceed with transcatheter aortic valve replacement on Tuesday, 09/29/2016.  Following the decision to proceed with transcatheter aortic valve replacement, a discussion has been held regarding what types of management strategies would be attempted intraoperatively in the event of life-threatening complications, including whether or not the patient would be considered a candidate for the use of cardiopulmonary bypass and/or conversion to open sternotomy for attempted surgical intervention.  The patient has been advised of a variety of complications that might develop including but not limited to risks of death, stroke, paravalvular leak, aortic dissection or other major vascular complications, aortic annulus rupture, device embolization, cardiac rupture or perforation, mitral regurgitation, acute myocardial infarction, arrhythmia, heart block or bradycardia requiring permanent pacemaker placement, congestive heart failure, respiratory failure, renal failure, pneumonia, infection, other late complications related to structural valve deterioration or migration, or other complications that might ultimately cause a temporary or permanent loss of functional independence or other long term morbidity.  The patient provides full informed consent for the procedure as described and all questions were answered.  At some point the patient needs to be  seen by her dentist. She has no acute ongoing problems with her remaining teeth at this time but she has not seen a dentist in several years.  The patient has been advised regarding the importance of dental hygiene and the lifelong need for antibiotic prophylaxis for all dental cleanings and other related invasive procedures.    I spent in excess of 90 minutes during the conduct of this office consultation and >50% of this time involved direct face-to-face encounter with the patient for counseling and/or coordination of their care.    Valentina Gu. Roxy Manns, MD 09/16/2016 4:54 PM

## 2016-09-17 ENCOUNTER — Encounter: Payer: Medicare Other | Admitting: Thoracic Surgery (Cardiothoracic Vascular Surgery)

## 2016-09-23 ENCOUNTER — Ambulatory Visit (HOSPITAL_COMMUNITY): Payer: Self-pay | Admitting: Dentistry

## 2016-09-23 ENCOUNTER — Encounter (HOSPITAL_COMMUNITY): Payer: Self-pay | Admitting: Dentistry

## 2016-09-23 VITALS — BP 136/56 | HR 72 | Temp 98.0°F

## 2016-09-23 DIAGNOSIS — K045 Chronic apical periodontitis: Secondary | ICD-10-CM

## 2016-09-23 DIAGNOSIS — K029 Dental caries, unspecified: Secondary | ICD-10-CM

## 2016-09-23 DIAGNOSIS — K0530 Chronic periodontitis, unspecified: Secondary | ICD-10-CM | POA: Insufficient documentation

## 2016-09-23 DIAGNOSIS — K036 Deposits [accretions] on teeth: Secondary | ICD-10-CM | POA: Insufficient documentation

## 2016-09-23 DIAGNOSIS — Z01818 Encounter for other preprocedural examination: Secondary | ICD-10-CM

## 2016-09-23 DIAGNOSIS — M264 Malocclusion, unspecified: Secondary | ICD-10-CM

## 2016-09-23 DIAGNOSIS — K06020 Generalized gingival recession, unspecified: Secondary | ICD-10-CM

## 2016-09-23 DIAGNOSIS — I350 Nonrheumatic aortic (valve) stenosis: Secondary | ICD-10-CM

## 2016-09-23 DIAGNOSIS — K083 Retained dental root: Secondary | ICD-10-CM | POA: Insufficient documentation

## 2016-09-23 DIAGNOSIS — K08409 Partial loss of teeth, unspecified cause, unspecified class: Secondary | ICD-10-CM

## 2016-09-23 MED ORDER — CHLORHEXIDINE GLUCONATE 0.12 % MT SOLN: OROMUCOSAL | 99 refills | Status: DC

## 2016-09-23 NOTE — Patient Instructions (Signed)
[facility]    Department of Dental Medicine     DR. KULINSKI      HEART VALVES AND MOUTH CARE:  FACTS:   If you have any infection in your mouth, it can infect your heart valve.  If you heart valve is infected, you will be seriously ill.  Infections in the mouth can be SILENT and do not always cause pain.  Examples of infections in the mouth are gum disease, dental cavities, and abscesses.  Some possible signs of infection are: Bad breath, bleeding gums, or teeth that are sensitive to sweets, hot, and/or cold. There are many other signs as well.  WHAT YOU HAVE TO DO:   Brush your teeth after meals and at bedtime. Spend at least 2 minutes brushing well, especially behind your back teeth and all around your teeth that stand alone. Brush at the gumline also.  Do not go to bed without brushing your teeth and flossing.  If you gums bleed when you brush or floss, do NOT stop brushing or flossing. It usually means that your gums need more attention and better cleaning.   If your Dentist or Dr. Kulinski gave you a prescription mouthwash to use, make sure to use it as directed. If you run out of the medication, get a refill at the pharmacy.   If you were given any other medications or directions by your Dentist, please follow them. If you did not understand the directions or forget what you were told, please call. We will be happy to refresh her memory.  If you need antibiotics before dental procedures, make sure you take them one hour prior to every dental visit as directed.   Get a dental checkup every 4-6 months in order to keep your mouth healthy, or to find and treat any new infection. You will most likely need your teeth cleaned or gums treated at the same time.  If you are not able to come in for your scheduled appointment, call your Dentist as soon as possible to reschedule.  If you have a problem in between dental visits, call your Dentist.  

## 2016-09-23 NOTE — Progress Notes (Signed)
DENTAL CONSULTATION  Date of Consultation:  09/23/2016 Patient Name:   Angela Trujillo Date of Birth:   10-24-1930 Medical Record Number: [id number]  VITALS: BP (!) 136/56 (BP Location: Left Arm)   Pulse 72   Temp 98 F (36.7 C) (Oral)   CHIEF COMPLAINT: The patient was referred by Dr. Roxy Manns for dental consultation.  HPI: Nandy Paull is an 83 year old female recently diagnosed with severe aortic stenosis. Patient with anticipated aortic valve replacement in the near future. Patient is now seen as part of a medically necessary pre-heart valve surgery dental protocol examination rule out dental infection that may affect the patient's systemic health and anticipated heart valve surgery.  The patient currently denies acute toothaches, swellings, or abscesses. Patient has not seen a dentist for "a long time".  Patient saw a dentist and [place], [place] for fabrication of a maxillary partial denture. The patient has not seen a dentist since that time. That [place] dentist has moved away since then. The patient was seen by the [facility] dentist approximately 5-10 years ago by patient report. Patient indicates that partial denture "fits fine". Patient has no lower partial denture. Patient denies having dental phobia.   PROBLEM LIST: Patient Active Problem List   Diagnosis Date Noted  . Severe aortic stenosis 08/19/2016    Priority: High  . Atrial flutter ([place])   . Anemia 04/15/2013  . Acute renal failure ([place]) 04/15/2013  . HTN (hypertension) 04/15/2013  . DM2 (diabetes mellitus, type 2) ([place]) 04/15/2013    PMH: Past Medical History:  Diagnosis Date  . Atrial flutter ([place])   . Diabetes mellitus with end stage renal disease ([place])   . ESRD (end stage renal disease) on dialysis ([place])    HD on T,T, Sa  . Hypertension   . Severe aortic stenosis 08/19/2016    PSH: Past Surgical History:  Procedure Laterality Date  . ABDOMINAL HYSTERECTOMY    . CARDIAC CATHETERIZATION N/A  08/19/2016   Procedure: Right/Left Heart Cath and Coronary Angiography;  Surgeon: Sherren Mocha, MD;  Location: [facility];  Service: Cardiovascular;  Laterality: N/A;  . IR GENERIC HISTORICAL  08/31/2016   IR RADIOLOGY PERIPHERAL GUIDED IV START 08/31/2016 Corrie Mckusick, DO [facility]  . IR GENERIC HISTORICAL  08/31/2016   IR US GUIDE VASC ACCESS RIGHT 08/31/2016 Corrie Mckusick, DO [facility]  . TUBAL LIGATION      ALLERGIES: Allergies  Allergen Reactions  . Asa [Aspirin] Nausea Only    Makes stomach hurt also  . Codeine Rash  . Penicillins Rash    Has patient had a PCN reaction causing immediate rash, facial/tongue/throat swelling, SOB or lightheadedness with hypotension: No Has patient had a PCN reaction causing severe rash involving mucus membranes or skin necrosis: No Has patient had a PCN reaction that required hospitalization: No Has patient had a PCN reaction occurring within the last 10 years: No If all of the above answers are "NO", then may proceed with Cephalosporin use.     MEDICATIONS: Current Outpatient Prescriptions  Medication Sig Dispense Refill  . atorvastatin (LIPITOR) 20 MG tablet Take 20 mg by mouth at bedtime.    . calcium acetate (PHOSLO) 667 MG capsule Take 667 mg by mouth daily with breakfast.    . famotidine (PEPCID) 20 MG tablet Take 20 mg by mouth 2 (two) times daily.    . Insulin Detemir (LEVEMIR FLEXTOUCH) 100 UNIT/ML Pen Inject 14 Units into the skin 2 (two) times daily.    Marland Kitchen  metoprolol succinate (TOPROL-XL) 100 MG 24 hr tablet Take 100 mg by mouth daily.    . SENSIPAR 30 MG tablet Take 30 mg by mouth Every Tuesday,Thursday,and Saturday with dialysis.      No current facility-administered medications for this visit.     LABS: Lab Results  Component Value Date   WBC 6.8 08/19/2016   HGB 11.6 (L) 08/19/2016   HCT 34.7 (L) 08/19/2016   MCV 93.5 08/19/2016   PLT 175 08/19/2016      Component Value Date/Time   NA 139 08/19/2016  1334   K 4.6 08/19/2016 1334   CL 103 08/19/2016 1334   CO2 24 08/19/2016 1334   GLUCOSE 108 (H) 08/19/2016 1334   BUN 47 (H) 08/19/2016 1334   CREATININE 8.22 (H) 08/19/2016 1334   CALCIUM 8.7 (L) 08/19/2016 1334   CALCIUM 7.4 (L) 12/14/2015 0536   GFRNONAA 4 (L) 08/19/2016 1334   GFRAA 5 (L) 08/19/2016 1334   Lab Results  Component Value Date   INR 1.09 08/19/2016   No results found for: PTT  SOCIAL HISTORY: Social History   Social History  . Marital status: Widowed    Spouse name: N/A  . Number of children: N/A  . Years of education: N/A   Occupational History  . Not on file.   Social History Main Topics  . Smoking status: Never Smoker  . Smokeless tobacco: Never Used  . Alcohol use No  . Drug use: Unknown  . Sexual activity: No   Other Topics Concern  . Not on file   Social History Narrative  . No narrative on file    FAMILY HISTORY: History reviewed. No pertinent family history.  REVIEW OF SYSTEMS: Reviewed the ROS from Dr. Guy Sandifer note on 09/16/2016 with the patient with changes noted in bold. Review of Systems: General:                      normal appetite, decreased energy, + weight gain, no weight loss, no fever  Cardiac:                       no chest pain with exertion, no chest pain at rest, +SOB with exertion, no resting SOB, no PND, no orthopnea, no palpitations, no arrhythmia, no atrial fibrillation, + LE edema, no dizzy spells, no syncope  Respiratory:                 + shortness of breath, no home oxygen, no productive cough, no dry cough, no bronchitis, no wheezing, no hemoptysis, no asthma, no pain with inspiration or cough, no sleep apnea, no CPAP at night  GI:                               no difficulty swallowing, no reflux, no frequent heartburn, no hiatal hernia, no abdominal pain, no constipation, + diarrhea, no hematochezia, no hematemesis, no melena  GU:                              Occasionally makes small amount of urine, no dysuria,   no frequency, no urinary tract infection, no hematuria, no kidney stones, + chronic kidney disease with hemodialysis on Tuesday, Thursday, and Saturday and [place].  Vascular:  no pain suggestive of claudication, no pain in feet, no leg cramps, no varicose veins, no DVT, no non-healing foot ulcer  Neuro:                         no stroke, no TIA's, no seizures, no headaches, no temporary blindness one eye,  no slurred speech, no peripheral neuropathy, no chronic pain, + instability of gait, no memory/cognitive dysfunction  Musculoskeletal:         + arthritis, no joint swelling, no myalgias, + some difficulty walking, limited mobility   Skin:                            no rash, no itching, no skin infections, no pressure sores or ulcerations  Psych:                          denies dental phobia, no anxiety, no depression, no nervousness, no unusual recent stress  Eyes:                           no blurry vision, no floaters, no recent vision changes, + wears glasses or contacts  ENT:                            no hearing loss, no loose or painful teeth, + partial upper denture, last saw dentist 5-10 years ago  Hematologic:               no easy bruising, no abnormal bleeding, no clotting disorder, no frequent epistaxis  Endocrine:                   + diabetes, does check CBG's at home                            DENTAL HISTORY: CHIEF COMPLAINT: The patient was referred by Dr. Roxy Manns for dental consultation.  HPI: Ronya Drain is an 83 year old female recently diagnosed with severe aortic stenosis. Patient with anticipated aortic valve replacement in the near future. Patient is now seen as part of a medically necessary pre-heart valve surgery dental protocol examination rule out dental infection that may affect the patient's systemic health and anticipated heart valve surgery.  The patient currently denies acute toothaches, swellings, or abscesses. Patient has not seen a  dentist for "a long time".  Patient saw a dentist and [place], [place] for fabrication of a maxillary partial denture. The patient has not seen a dentist since that time. That [place] dentist has moved away since then. The patient was seen by the [facility] dentist approximately 5-10 years ago by patient report. Patient indicates that partial denture "fits fine". Patient has no lower partial denture. Patient denies having dental phobia.  DENTAL EXAMINATION: GENERAL: The patient is a well-developed, well-nourished female in no acute distress. HEAD AND NECK: There is no palpable neck lymphadenopathy. The patient denies acute TMJ symptoms. INTRAORAL EXAM: Patient has normal saliva. There is no evidence of oral abscess formation. DENTITION: Patient is missing tooth numbers 1, 3, 4, 12-15, [id number]. Patient has retained root segments in the area of #31. Multiple rotated teeth are noted. PERIODONTAL: Patient has chronic periodontitis with plaque and calculus accumulations, generalized gingival recession, and no significant  tooth mobility. There is incipient to moderate bone loss noted. DENTAL CARIES/SUBOPTIMAL RESTORATIONS: Multiple dental caries and suboptimal dental restorations are noted as per dental charting form. Extensive dental caries associated with tooth #31. ENDODONTIC: Patient currently denies acute pulpitis symptoms. There is periapical pathology and radiolucency associated with the apex of tooth #31. The patient has had previous root canal therapies associated with tooth numbers 5 and 6 with no obvious persistent periapical pathology or radiolucency. CROWN AND BRIDGE: There are PFM crowns on tooth #'s 5 and 6. PROSTHODONTIC: Patient has a maxillary partial denture that is clinically acceptable. The denture is missing denture tooth numbers 12 and 13. OCCLUSION: She has a poor occlusal scheme secondary to multiple missing teeth, supra-eruption and drifting of the unopposed teeth  into the edentulous areas, and lack of replacement of all missing teeth with dental prostheses.  RADIOGRAPHIC INTERPRETATION: Orthopantogram was taken and supplemented with 13 periapical radiographs. There are multiple missing teeth. There are dental caries noted. There is incipient to moderate bone loss noted. There is supra-eruption and drifting of the unopposed teeth into the edentulous areas. There is periapical radiolucency at the apex of tooth #31. Patient has previous root canal therapies associated with tooth numbers 5 and 6 with no obvious persistent periapical pathology or radiolucency.   ASSESSMENTS: 1. Severe aortic stenosis 2. Pre-heart valve surgery dental protocol 3. Chronic apical periodontitis 4. Multiple Dental caries and suboptimal dental restorations 5. Retained root segments area #31 6. Chronic periodontitis with bone loss 7. Generalized gingival recession 8. Accretions 9. No significant tooth mobility 10. Multiple missing teeth 11. Supra-eruption and drifting of the unopposed teeth into the edentulous areas 12. Maxillary cast partial denture with missing denture tooth numbers 12 and 13 13. Poor occlusal scheme and malocclusion 14. Risk for complications up to and including death due to overall cardiovascular and respiratory compromise with anticipated dental procedures in the operating room with general anesthesia.  PLAN/RECOMMENDATIONS: 1. I discussed the risks, benefits, and complications of various treatment options with the patient and her sons in relationship to her medical and dental conditions, anticipated heart valve surgery, and risk for endocarditis. We discussed various treatment options to include no treatment, extraction of indicated teeth with alveoloplasty, pre-prosthetic surgery as indicated, periodontal therapy, dental restorations, root canal therapy, crown and bridge therapy, implant therapy, and replacement of missing teeth as indicated. We also  discussed referral to an oral surgeon at this time. The patient currently refuses oral surgery referral and does wish to proceed with extraction of tooth #31 with alveoloplasty and gross debridement of remaining dentition in the operating room with general anesthesia. This has been scheduled for Friday, 09/25/2016 at 9:55 AM at [facility]. The patient will then proceed with the anticipated TAVR procedure at the discretion of Dr. Burt Knack and Dr. Cyndia Bent. Patient is aware that she will need follow-up with a new primary dentist for dental restorations and continued periodontal therapy as indicated. Patient will require antibiotic premedication prior to invasive dental procedures as per American Heart Association guidelines. A prescription for chlorhexidine gluconate rinse was provided to the patient today to use in a twice a day basis as prescribed. Patient is to use in a swish and spit manner. Patient was sent to [facility] with as needed refills.   2. Discussion of findings with medical team and coordination of future medical and dental care as needed. I did discuss the case with Dr. Cyndia Bent and he agrees to allow the dental procedures for this coming Friday  at 9:55 AM in the operating room with general anesthesia.  I spent in excess of 120 minutes during the conduct of this consultation and >50% of this time involved direct face-to-face encounter for counseling and/or coordination of the patient's care.    Lenn Cal, DDS

## 2016-09-24 ENCOUNTER — Encounter (HOSPITAL_COMMUNITY): Payer: Self-pay | Admitting: *Deleted

## 2016-09-24 NOTE — Progress Notes (Signed)
Spoke with pt's son, Abagail Kitchens for pre-op call. Pt does have hx of A-flutter and severe aortic stenosis. No other cardiac problems. He states she does not complain of chest pain, but will have some shortness of breath with exertion at times. Pt is diabetic. He states her fasting blood sugar usually ranges between 60-120. Instructed him to have pt take 1/2 of her regular dose of Levemir Insulin tonight and in the AM (will take 7 units). Instructed him to have her check her blood sugar in the AM and every 2 hours until she leaves for the hospital. If blood sugar is 70 or below, treat with 1/2 cup of clear juice (apple or cranberry) and recheck blood sugar 15 minutes after drinking juice. If blood sugar continues to be 70 or below, call the Short Stay department and ask to speak to a nurse. He voiced understanding.

## 2016-09-25 ENCOUNTER — Ambulatory Visit (HOSPITAL_COMMUNITY)
Admission: RE | Admit: 2016-09-25 | Discharge: 2016-09-25 | Disposition: A | Discharge status: Home / Self Care | Payer: Medicare Other | Source: Ambulatory Visit | Attending: Dentistry | Admitting: Dentistry

## 2016-09-25 ENCOUNTER — Inpatient Hospital Stay (HOSPITAL_COMMUNITY): Admission: RE | Admit: 2016-09-25 | Payer: Medicare Other | Source: Ambulatory Visit

## 2016-09-25 ENCOUNTER — Ambulatory Visit (HOSPITAL_COMMUNITY): Payer: Medicare Other | Admitting: Certified Registered Nurse Anesthetist

## 2016-09-25 ENCOUNTER — Encounter (HOSPITAL_COMMUNITY)
Admission: RE | Disposition: A | Discharge status: Home / Self Care | Payer: Self-pay | Source: Ambulatory Visit | Attending: Dentistry

## 2016-09-25 ENCOUNTER — Encounter (HOSPITAL_COMMUNITY): Payer: Medicare Other

## 2016-09-25 ENCOUNTER — Ambulatory Visit (HOSPITAL_COMMUNITY): Payer: Medicare Other

## 2016-09-25 ENCOUNTER — Other Ambulatory Visit: Payer: Self-pay

## 2016-09-25 DIAGNOSIS — K029 Dental caries, unspecified: Secondary | ICD-10-CM | POA: Diagnosis present

## 2016-09-25 DIAGNOSIS — Z886 Allergy status to analgesic agent status: Secondary | ICD-10-CM | POA: Diagnosis not present

## 2016-09-25 DIAGNOSIS — E1122 Type 2 diabetes mellitus with diabetic chronic kidney disease: Secondary | ICD-10-CM | POA: Diagnosis not present

## 2016-09-25 DIAGNOSIS — Z90710 Acquired absence of both cervix and uterus: Secondary | ICD-10-CM | POA: Diagnosis not present

## 2016-09-25 DIAGNOSIS — I350 Nonrheumatic aortic (valve) stenosis: Secondary | ICD-10-CM

## 2016-09-25 DIAGNOSIS — K036 Deposits [accretions] on teeth: Secondary | ICD-10-CM

## 2016-09-25 DIAGNOSIS — I120 Hypertensive chronic kidney disease with stage 5 chronic kidney disease or end stage renal disease: Secondary | ICD-10-CM | POA: Diagnosis not present

## 2016-09-25 DIAGNOSIS — R0602 Shortness of breath: Secondary | ICD-10-CM | POA: Insufficient documentation

## 2016-09-25 DIAGNOSIS — N186 End stage renal disease: Secondary | ICD-10-CM | POA: Insufficient documentation

## 2016-09-25 DIAGNOSIS — Z885 Allergy status to narcotic agent status: Secondary | ICD-10-CM | POA: Diagnosis not present

## 2016-09-25 DIAGNOSIS — Z992 Dependence on renal dialysis: Secondary | ICD-10-CM | POA: Insufficient documentation

## 2016-09-25 DIAGNOSIS — K219 Gastro-esophageal reflux disease without esophagitis: Secondary | ICD-10-CM | POA: Insufficient documentation

## 2016-09-25 DIAGNOSIS — K083 Retained dental root: Secondary | ICD-10-CM

## 2016-09-25 DIAGNOSIS — Z794 Long term (current) use of insulin: Secondary | ICD-10-CM | POA: Insufficient documentation

## 2016-09-25 DIAGNOSIS — Z880 Allergy status to penicillin: Secondary | ICD-10-CM | POA: Diagnosis not present

## 2016-09-25 DIAGNOSIS — K0530 Chronic periodontitis, unspecified: Secondary | ICD-10-CM

## 2016-09-25 DIAGNOSIS — K045 Chronic apical periodontitis: Secondary | ICD-10-CM

## 2016-09-25 DIAGNOSIS — I700 Atherosclerosis of aorta: Secondary | ICD-10-CM | POA: Insufficient documentation

## 2016-09-25 DIAGNOSIS — I272 Pulmonary hypertension, unspecified: Secondary | ICD-10-CM | POA: Insufficient documentation

## 2016-09-25 DIAGNOSIS — I4892 Unspecified atrial flutter: Secondary | ICD-10-CM | POA: Insufficient documentation

## 2016-09-25 HISTORY — DX: Dyspnea, unspecified: R06.00

## 2016-09-25 HISTORY — PX: MULTIPLE EXTRACTIONS WITH ALVEOLOPLASTY: SHX5342

## 2016-09-25 HISTORY — DX: Pneumonia, unspecified organism: J18.9

## 2016-09-25 HISTORY — DX: Gastro-esophageal reflux disease without esophagitis: K21.9

## 2016-09-25 LAB — COMPREHENSIVE METABOLIC PANEL
ALK PHOS: 124 U/L (ref 38–126)
ALT: 15 U/L (ref 14–54)
AST: 18 U/L (ref 15–41)
Albumin: 3.2 g/dL — ABNORMAL LOW (ref 3.5–5.0)
Anion gap: 12 (ref 5–15)
BUN: 29 mg/dL — AB (ref 6–20)
CALCIUM: 8.9 mg/dL (ref 8.9–10.3)
CHLORIDE: 100 mmol/L — AB (ref 101–111)
CO2: 21 mmol/L — AB (ref 22–32)
CREATININE: 6.85 mg/dL — AB (ref 0.44–1.00)
GFR, EST AFRICAN AMERICAN: 6 mL/min — AB (ref >60)
GFR, EST NON AFRICAN AMERICAN: 5 mL/min — AB (ref >60)
Glucose, Bld: 135 mg/dL — ABNORMAL HIGH (ref 65–99)
Potassium: 4.3 mmol/L (ref 3.5–5.1)
Sodium: 133 mmol/L — ABNORMAL LOW (ref 135–145)
Total Bilirubin: 0.8 mg/dL (ref 0.3–1.2)
Total Protein: 7 g/dL (ref 6.5–8.1)

## 2016-09-25 LAB — BLOOD GAS, ARTERIAL
ACID-BASE EXCESS: 1.3 mmol/L (ref 0.0–2.0)
Bicarbonate: 24.8 mmol/L (ref 20.0–28.0)
DRAWN BY: 410591
FIO2: 21
O2 Saturation: 96.5 %
PH ART: 7.461 — AB (ref 7.350–7.450)
Patient temperature: 98.6
pCO2 arterial: 35.2 mmHg (ref 32.0–48.0)
pO2, Arterial: 86.3 mmHg (ref 83.0–108.0)

## 2016-09-25 LAB — CBC
HCT: 31.9 % — ABNORMAL LOW (ref 36.0–46.0)
HEMOGLOBIN: 11 g/dL — AB (ref 12.0–15.0)
MCH: 31.2 pg (ref 26.0–34.0)
MCHC: 34.5 g/dL (ref 30.0–36.0)
MCV: 90.4 fL (ref 78.0–100.0)
PLATELETS: 143 10*3/uL — AB (ref 150–400)
RBC: 3.53 MIL/uL — AB (ref 3.87–5.11)
RDW: 13.6 % (ref 11.5–15.5)
WBC: 7.5 10*3/uL (ref 4.0–10.5)

## 2016-09-25 LAB — GLUCOSE, CAPILLARY
GLUCOSE-CAPILLARY: 128 mg/dL — AB (ref 65–99)
Glucose-Capillary: 124 mg/dL — ABNORMAL HIGH (ref 65–99)

## 2016-09-25 LAB — TYPE AND SCREEN
ABO/RH(D): O POS
Antibody Screen: NEGATIVE

## 2016-09-25 LAB — PROTIME-INR
INR: 1.13
PROTHROMBIN TIME: 14.6 s (ref 11.4–15.2)

## 2016-09-25 LAB — APTT: aPTT: 38 seconds — ABNORMAL HIGH (ref 24–36)

## 2016-09-25 LAB — ABO/RH: ABO/RH(D): O POS

## 2016-09-25 SURGERY — MULTIPLE EXTRACTION WITH ALVEOLOPLASTY
Anesthesia: General

## 2016-09-25 MED ORDER — 0.9 % SODIUM CHLORIDE (POUR BTL) OPTIME
TOPICAL | Status: DC | PRN
  Administered 2016-09-25: 1000 mL

## 2016-09-25 MED ORDER — LIDOCAINE HCL (CARDIAC) 20 MG/ML IV SOLN
INTRAVENOUS | Status: DC | PRN
  Administered 2016-09-25: 100 mg via INTRAVENOUS

## 2016-09-25 MED ORDER — BUPIVACAINE-EPINEPHRINE (PF) 0.5% -1:200000 IJ SOLN
INTRAMUSCULAR | Status: AC
  Filled 2016-09-25: qty 3.6

## 2016-09-25 MED ORDER — BUPIVACAINE-EPINEPHRINE 0.5% -1:200000 IJ SOLN
INTRAMUSCULAR | Status: DC | PRN
  Administered 2016-09-25: 3.6 mL

## 2016-09-25 MED ORDER — FENTANYL CITRATE (PF) 100 MCG/2ML IJ SOLN
INTRAMUSCULAR | Status: DC | PRN
  Administered 2016-09-25: 50 ug via INTRAVENOUS

## 2016-09-25 MED ORDER — PROPOFOL 10 MG/ML IV BOLUS
INTRAVENOUS | Status: AC
  Filled 2016-09-25: qty 20

## 2016-09-25 MED ORDER — PHENYLEPHRINE HCL 10 MG/ML IJ SOLN
INTRAMUSCULAR | Status: DC | PRN
  Administered 2016-09-25: 40 ug via INTRAVENOUS
  Administered 2016-09-25: 80 ug via INTRAVENOUS
  Administered 2016-09-25: 40 ug via INTRAVENOUS
  Administered 2016-09-25: 80 ug via INTRAVENOUS

## 2016-09-25 MED ORDER — GELATIN ABSORBABLE 12-7 MM EX MISC
CUTANEOUS | Status: DC | PRN
  Administered 2016-09-25: 1

## 2016-09-25 MED ORDER — FENTANYL CITRATE (PF) 100 MCG/2ML IJ SOLN
INTRAMUSCULAR | Status: AC
  Filled 2016-09-25: qty 2

## 2016-09-25 MED ORDER — CLINDAMYCIN PHOSPHATE 600 MG/50ML IV SOLN
INTRAVENOUS | Status: AC
  Filled 2016-09-25: qty 50

## 2016-09-25 MED ORDER — CLINDAMYCIN PHOSPHATE 600 MG/50ML IV SOLN
600.0000 mg | Freq: Once | INTRAVENOUS | Status: AC
  Administered 2016-09-25: 600 mg via INTRAVENOUS

## 2016-09-25 MED ORDER — METOCLOPRAMIDE HCL 5 MG/ML IJ SOLN
5.0000 mg | Freq: Once | INTRAMUSCULAR | Status: AC
  Administered 2016-09-25: 5 mg via INTRAVENOUS

## 2016-09-25 MED ORDER — ETOMIDATE 2 MG/ML IV SOLN
INTRAVENOUS | Status: DC | PRN
  Administered 2016-09-25: 15 mg via INTRAVENOUS

## 2016-09-25 MED ORDER — EPHEDRINE SULFATE 50 MG/ML IJ SOLN
INTRAMUSCULAR | Status: DC | PRN
  Administered 2016-09-25: 10 mg via INTRAVENOUS
  Administered 2016-09-25: 5 mg via INTRAVENOUS
  Administered 2016-09-25: 10 mg via INTRAVENOUS
  Administered 2016-09-25: 5 mg via INTRAVENOUS
  Administered 2016-09-25: 10 mg via INTRAVENOUS
  Administered 2016-09-25: 5 mg via INTRAVENOUS

## 2016-09-25 MED ORDER — LIDOCAINE-EPINEPHRINE 2 %-1:100000 IJ SOLN
INTRAMUSCULAR | Status: DC | PRN
  Administered 2016-09-25: 3.4 mL via INTRADERMAL

## 2016-09-25 MED ORDER — FENTANYL CITRATE (PF) 100 MCG/2ML IJ SOLN: 25.0000 ug | INTRAMUSCULAR | Status: DC | PRN

## 2016-09-25 MED ORDER — SODIUM CHLORIDE 0.9 % IV SOLN
INTRAVENOUS | Status: DC
  Administered 2016-09-25: 10 mL/h via INTRAVENOUS

## 2016-09-25 MED ORDER — SODIUM CHLORIDE 0.9 % IV SOLN: INTRAVENOUS | Status: DC

## 2016-09-25 MED ORDER — ONDANSETRON HCL 4 MG/2ML IJ SOLN
INTRAMUSCULAR | Status: DC | PRN
  Administered 2016-09-25: 4 mg via INTRAVENOUS

## 2016-09-25 MED ORDER — SODIUM CHLORIDE 0.9 % IV SOLN
INTRAVENOUS | Status: DC | PRN
  Administered 2016-09-25 (×2): via INTRAVENOUS

## 2016-09-25 MED ORDER — HYDROCODONE-ACETAMINOPHEN 7.5-325 MG/15ML PO SOLN: 10.0000 mL | Freq: Four times a day (QID) | ORAL | 0 refills | Status: DC | PRN

## 2016-09-25 MED ORDER — METOCLOPRAMIDE HCL 5 MG/ML IJ SOLN
INTRAMUSCULAR | Status: AC
  Administered 2016-09-25: 5 mg via INTRAVENOUS
  Filled 2016-09-25: qty 2

## 2016-09-25 MED ORDER — LIDOCAINE-EPINEPHRINE 2 %-1:100000 IJ SOLN
INTRAMUSCULAR | Status: AC
  Filled 2016-09-25: qty 10.2

## 2016-09-25 SURGICAL SUPPLY — 36 items
ALCOHOL 70% 16 OZ (MISCELLANEOUS) ×3 IMPLANT
ATTRACTOMAT 16X20 MAGNETIC DRP (DRAPES) ×3 IMPLANT
BLADE SURG 15 STRL LF DISP TIS (BLADE) ×2 IMPLANT
BLADE SURG 15 STRL SS (BLADE) ×4
COVER SURGICAL LIGHT HANDLE (MISCELLANEOUS) ×3 IMPLANT
GAUZE PACKING FOLDED 2  STR (GAUZE/BANDAGES/DRESSINGS) ×2
GAUZE PACKING FOLDED 2 STR (GAUZE/BANDAGES/DRESSINGS) ×1 IMPLANT
GAUZE SPONGE 4X4 16PLY XRAY LF (GAUZE/BANDAGES/DRESSINGS) ×3 IMPLANT
GLOVE BIOGEL PI IND STRL 6 (GLOVE) ×1 IMPLANT
GLOVE BIOGEL PI INDICATOR 6 (GLOVE) ×2
GLOVE SURG ORTHO 8.0 STRL STRW (GLOVE) ×3 IMPLANT
GLOVE SURG SS PI 6.0 STRL IVOR (GLOVE) ×3 IMPLANT
GOWN STRL REUS W/ TWL LRG LVL3 (GOWN DISPOSABLE) ×1 IMPLANT
GOWN STRL REUS W/TWL 2XL LVL3 (GOWN DISPOSABLE) ×3 IMPLANT
GOWN STRL REUS W/TWL LRG LVL3 (GOWN DISPOSABLE) ×2
HEMOSTAT SURGICEL 2X14 (HEMOSTASIS) ×3 IMPLANT
KIT BASIN OR (CUSTOM PROCEDURE TRAY) ×3 IMPLANT
KIT ROOM TURNOVER OR (KITS) ×3 IMPLANT
MANIFOLD NEPTUNE WASTE (CANNULA) ×3 IMPLANT
NEEDLE BLUNT 16X1.5 OR ONLY (NEEDLE) ×3 IMPLANT
NS IRRIG 1000ML POUR BTL (IV SOLUTION) ×3 IMPLANT
PACK EENT II TURBAN DRAPE (CUSTOM PROCEDURE TRAY) ×3 IMPLANT
PAD ARMBOARD 7.5X6 YLW CONV (MISCELLANEOUS) ×3 IMPLANT
SPONGE SURGIFOAM ABS GEL 100 (HEMOSTASIS) IMPLANT
SPONGE SURGIFOAM ABS GEL 12-7 (HEMOSTASIS) IMPLANT
SPONGE SURGIFOAM ABS GEL SZ50 (HEMOSTASIS) ×3 IMPLANT
SUCTION FRAZIER HANDLE 10FR (MISCELLANEOUS) ×2
SUCTION TUBE FRAZIER 10FR DISP (MISCELLANEOUS) ×1 IMPLANT
SUT CHROMIC 3 0 PS 2 (SUTURE) ×6 IMPLANT
SUT CHROMIC 4 0 P 3 18 (SUTURE) IMPLANT
SYR 50ML SLIP (SYRINGE) ×3 IMPLANT
TOWEL OR 17X26 10 PK STRL BLUE (TOWEL DISPOSABLE) ×3 IMPLANT
TUBE CONNECTING 12'X1/4 (SUCTIONS) ×1
TUBE CONNECTING 12X1/4 (SUCTIONS) ×2 IMPLANT
WATER TABLETS ICX (MISCELLANEOUS) ×3 IMPLANT
YANKAUER SUCT BULB TIP NO VENT (SUCTIONS) ×3 IMPLANT

## 2016-09-25 NOTE — Anesthesia Postprocedure Evaluation (Signed)
Anesthesia Post Note  Patient: Angela Trujillo  Procedure(s) Performed: Procedure(s) (LRB): EXTRACTION of tooth number 31 WITH ALVEOLOPLASTY AND GROSS DEBRIDEMENT OF REMAINING TEETH (N/A)  Patient location during evaluation: PACU Anesthesia Type: General Level of consciousness: awake Pain management: pain level controlled Vital Signs Assessment: post-procedure vital signs reviewed and stable Respiratory status: spontaneous breathing Cardiovascular status: stable Anesthetic complications: no       Last Vitals:  Vitals:   09/25/16 1339 09/25/16 1349  BP:  139/66  Pulse: 69 69  Resp:  18  Temp:      Last Pain:  Vitals:   09/25/16 1349  TempSrc:   PainSc: 0-No pain                 Caprice Wasko

## 2016-09-25 NOTE — H&P (Signed)
09/25/2016  Patient:            Angela Trujillo Date of Birth:  1931-08-19 MRN:                [id number]   BP (!) 143/57   Pulse 67   Temp 98.5 F (36.9 C) (Oral) Comment: marilene  Resp 18   Wt 144 lb (65.3 kg)   SpO2 99%   BMI 23.96 kg/m   Angela Trujillo is an 83 yo female that presents for extraction of indicated teeth with alveoloplasty and gross debridement of remaining dentition in the operating room with general anesthesia. Patient denies any acute medical or dental changes. Please see note of Dr. Darylene Trujillo dated 09/16/2016 to act as the H&P for the dental operating room procedure.  Angela Trujillo, Angela Trujillo   Versions: 1. Angela Alberts, Angela Trujillo (Physician) at 09/16/2016 5:17 PM - [place] REPORT  Referring Provider is Angela Mocha, Angela Trujillo PCP is Angela Sou, Angela Trujillo      Chief Complaint  Patient presents with  . Aortic Stenosis    2nd TAVR eval, review all studies, surgery is scheduled for 09/29/2016    HPI:  Patient is an 83 year old African-American female with history of aortic stenosis, hypertension, type 2 diabetes mellitus with complications, and end-stage renal disease for which the patient is dialysis dependent who has been referred for second surgical opinion to discuss treatment options for management of severe symptomatic aortic stenosis. The patient has reportedly known to have a heart murmur for many years but never underwent formal cardiology evaluation until recently. In February 2017 she was hospitalized with pneumonia and respiratory failure at [facility]. [facility] than 1 month to recover. She was discharged initially to a skilled nursing facility and ultimately discharged home last May. Ever since then she has complained of exertional shortness  of breath. An echocardiogram was performed in October of this year and revealed low normal left ventricular systolic function with ejection fraction estimated 50-55%, moderate concentric left ventricular hypertrophy, mild to moderate mitral regurgitation, moderate tricuspid regurgitation, and moderate to severe pulmonary hypertension. There was evidence consistent with severe aortic stenosis with peak and mean transvalvular gradients estimated 79 and 51 mmHg, respectively. The patient was referred to Dr. Burt Trujillo who saw her in consultation on 08/07/2016. The patient subsequently underwent repeat echocardiogram and diagnostic cardiac catheterization.  Echocardiogram performed 08/19/2016 revealed essentially normal left ventricular systolic function with ejection fraction estimated 55-60%. There was severe calcification with severely restricted leaflet mobility involving all 3 leaflets of the aortic valve. Peak velocity across the aortic valve was reported 4.3 m/s corresponding to mean transvalvular gradient estimated 46 mmHg. There was mild mitral regurgitation and moderate pulmonary hypertension and mild tricuspid regurgitation.  Diagnostic cardiac catheterization performed 08/19/2016 revealed minimal nonobstructive coronary artery disease and confirms the presence of severe calcific aortic stenosis with mean transvalvular gradient measured 40 mmHg by catheterization. There was severe pulmonary hypertension. The patient subsequently underwent CT angiography and was referred for surgical consultation. She has been evaluated previously by Dr. Cyndia Trujillo and felt to be at relatively high risk for conventional surgical aortic valve replacement. Transcatheter aortic valve replacement has been discussed as an alternative, and the patient has been referred for a second opinion.  The patient is widowed and lives alone in [place]. She  has a total of 5 adult children. Two of her sons live close by and are very supportive. At  least one of them stays with her essentially all of the time.  The patient is not very active physically. She uses a walker or a cane for limited mobility around the house. When she goes out one of her sons always accompanies her and oftentimes they will take a wheelchair.  The patient claims that her biggest limitation is that of exertional shortness of breath and fatigue. She gets short of breath quite easily but she denies any symptoms of resting shortness breath, PND, orthopnea, or lower extremity edema. She has not had chest pain or chest tightness either with activity or at rest. She has not had dizzy spells or syncope. She has had some problems with her blood pressure dropping during dialysis treatments. She dialyzes on a Tuesday Thursday Saturday schedule at the dialysis Center in [facility] on [facility] through a right upper arm AV graft.      Past Medical History:  Diagnosis Date  . Atrial flutter ([place])   . Diabetes mellitus with end stage renal disease ([place])   . ESRD (end stage renal disease) on dialysis ([place])   . Hypertension   . Severe aortic stenosis 08/19/2016         Past Surgical History:  Procedure Laterality Date  . ABDOMINAL HYSTERECTOMY    . CARDIAC CATHETERIZATION N/A 08/19/2016   Procedure: Right/Left Heart Cath and Coronary Angiography;  Surgeon: Angela Mocha, Angela Trujillo;  Location: [facility];  Service: Cardiovascular;  Laterality: N/A;  . IR GENERIC HISTORICAL  08/31/2016   IR RADIOLOGY PERIPHERAL GUIDED IV START 08/31/2016 Angela Mckusick, Angela Trujillo [facility]  . IR GENERIC HISTORICAL  08/31/2016   IR US GUIDE VASC ACCESS RIGHT 08/31/2016 Angela Mckusick, Angela Trujillo [facility]  . TUBAL LIGATION      History reviewed. No pertinent family history.  Social History        Social History  . Marital status: Widowed    Spouse name: N/A  . Number of children: N/A  . Years of education: N/A      Occupational History  . Not on file.         Social History Main Topics  . Smoking status: Never Smoker  . Smokeless tobacco: Never Used  . Alcohol use No  . Drug use: Unknown  . Sexual activity: No       Other Topics Concern  . Not on file      Social History Narrative  . No narrative on file          Current Outpatient Prescriptions  Medication Sig Dispense Refill  . atorvastatin (LIPITOR) 20 MG tablet Take 20 mg by mouth at bedtime.    . calcium acetate (PHOSLO) 667 MG capsule Take 667 mg by mouth daily with breakfast.    . famotidine (PEPCID) 20 MG tablet Take 20 mg by mouth 2 (two) times daily.    . Insulin Detemir (LEVEMIR FLEXTOUCH) 100 UNIT/ML Pen Inject 14 Units into the skin 2 (two) times daily.    . metoprolol succinate (TOPROL-XL) 100 MG 24 hr tablet Take 100 mg by mouth daily.    . SENSIPAR 30 MG tablet Take 30 mg by mouth See admin instructions. The son believes they give this to her at dialysis on Tue, Thur, Sat.     No current facility-administered medications for this visit.  Allergies  Allergen Reactions  . Asa [Aspirin] Nausea Only    Makes stomach hurt also  . Codeine Rash  . Penicillins Rash    Has patient had a PCN reaction causing immediate rash, facial/tongue/throat swelling, SOB or lightheadedness with hypotension: No Has patient had a PCN reaction causing severe rash involving mucus membranes or skin necrosis: No Has patient had a PCN reaction that required hospitalization: No Has patient had a PCN reaction occurring within the last 10 years: No If all of the above answers are "NO", then may proceed with Cephalosporin use.      Review of Systems:              General:                      normal appetite, decreased energy, + weight gain, no weight loss, no fever             Cardiac:                       no chest pain with exertion, no chest pain at rest, +SOB with exertion, no resting SOB, no PND, no orthopnea, no palpitations, no arrhythmia, no  atrial fibrillation, + LE edema, no dizzy spells, no syncope             Respiratory:                 + shortness of breath, no home oxygen, no productive cough, no dry cough, no bronchitis, no wheezing, no hemoptysis, no asthma, no pain with inspiration or cough, no sleep apnea, no CPAP at night             GI:                               no difficulty swallowing, no reflux, no frequent heartburn, no hiatal hernia, no abdominal pain, no constipation, + diarrhea, no hematochezia, no hematemesis, no melena             GU:                              Occasionally makes small amount of urine, no dysuria,  no frequency, no urinary tract infection, no hematuria, no kidney stones, + chronic kidney disease             Vascular:                     no pain suggestive of claudication, no pain in feet, no leg cramps, no varicose veins, no DVT, no non-healing foot ulcer             Neuro:                         no stroke, no TIA's, no seizures, no headaches, no temporary blindness one eye,  no slurred speech, no peripheral neuropathy, no chronic pain, + instability of gait, no memory/cognitive dysfunction             Musculoskeletal:         + arthritis, no joint swelling, no myalgias, + some difficulty walking, limited mobility              Skin:  no rash, no itching, no skin infections, no pressure sores or ulcerations             Psych:                         no anxiety, no depression, no nervousness, no unusual recent stress             Eyes:                           no blurry vision, no floaters, no recent vision changes, + wears glasses or contacts             ENT:                            no hearing loss, no loose or painful teeth, + partial upper dentures, last saw dentist several years ago             Hematologic:               no easy bruising, no abnormal bleeding, no clotting disorder, no frequent epistaxis             Endocrine:                   + diabetes, does check  CBG's at home                                                       Physical Exam:              BP (!) 147/87   Pulse 94   Resp 20   Ht 5\' 5"  (1.651 m)   Wt 144 lb (65.3 kg)   SpO2 92%   BMI 23.96 kg/m              General:                      Elderly and frail-appearing             HEENT:                       Unremarkable              Neck:                           no JVD, no bruits, no adenopathy              Chest:                          clear to auscultation, symmetrical breath sounds, no wheezes, no rhonchi              CV:                              RRR, grade IV/VI crescendo/decrescendo murmur heard best at RUSB,  no diastolic murmur             Abdomen:  soft, non-tender, no masses              Extremities:                 warm, well-perfused, pulses not palpable, no LE edema             Rectal/GU                   Deferred             Neuro:                         Grossly non-focal and symmetrical throughout             Skin:                            Clean and dry, no rashes, no breakdown   Diagnostic Tests:  Transthoracic Echocardiography  Patient: Naiovy, Thune MR #: [id number] Study Date: 08/19/2016 Gender: F Age: 36 Height: 165.1 cm Weight: 65.3 kg BSA: 1.74 m^2 Pt. Status: Room:  ADMITTING Angela Mocha, Angela Trujillo ATTENDING Angela Mocha, Angela Trujillo ORDERING Angela Mocha, Angela Trujillo REFERRING Angela Mocha, Angela Trujillo PERFORMING [facility], Inpatient SONOGRAPHER [facility]  cc:  ------------------------------------------------------------------- LV EF: 55% - 60%  ------------------------------------------------------------------- Indications: Aortic stenosis 424.1.  ------------------------------------------------------------------- History: PMH: Atrial flutter. Risk factors: Hypertension. Diabetes  mellitus.  ------------------------------------------------------------------- Study Conclusions  - Left ventricle: The cavity size was normal. There was mild concentric hypertrophy. Systolic function was normal. The estimated ejection fraction was in the range of 55% to 60%. Wall motion was normal; there were no regional wall motion abnormalities. Features are consistent with a pseudonormal left ventricular filling pattern, with concomitant abnormal relaxation and increased filling pressure (grade 2 diastolic dysfunction). Doppler parameters are consistent with elevated ventricular end-diastolic filling pressure. - Aortic valve: Trileaflet; severely thickened, severely calcified leaflets. Valve mobility was restricted. There was severe stenosis. Mean gradient (S): 46 mm Hg. Peak gradient (S): 76 mm Hg. - Aortic root: The aortic root was normal in size. - Ascending aorta: The ascending aorta was normal in size. - Mitral valve: Mildly thickened, mildly calcified leaflets . Mobility was restricted. There was mild regurgitation. - Left atrium: The atrium was moderately dilated. - Right ventricle: Systolic function was normal. - Right atrium: The atrium was mildly dilated. - Tricuspid valve: There was moderate regurgitation. - Pulmonic valve: There was no regurgitation. - Pulmonary arteries: Systolic pressure was moderately increased. PA peak pressure: 48 mm Hg (S). - Inferior vena cava: The vessel was normal in size. - Pericardium, extracardiac: There was no pericardial effusion.  Impressions:  - Severe aortic stenosis. Moderate pulmonary hypertension.  ------------------------------------------------------------------- Study data: No prior study was available for comparison. Study status: Routine. Procedure: The patient reported no pain pre or post test. Transthoracic echocardiography. Image quality was adequate. Study completion: There were  no complications. Transthoracic echocardiography. M-mode, complete 2D, spectral Doppler, and color Doppler. Birthdate: Patient birthdate: [birth year]. Age: Patient is 83 yr old. Sex: Gender: female. BMI: 24 kg/m^2. Blood pressure: 164/75 Patient status: Inpatient. Study date: Study date: 08/19/2016. Study time: 05:25 PM. Location: Bedside.  -------------------------------------------------------------------  ------------------------------------------------------------------- Left ventricle: The cavity size was normal. There was mild concentric hypertrophy. Systolic function was normal. The estimated ejection fraction was in the range of 55% to 60%. Wall motion was normal; there were no regional wall motion abnormalities. Features are consistent with a pseudonormal left ventricular filling  pattern, with concomitant abnormal relaxation and increased filling pressure (grade 2 diastolic dysfunction). Doppler parameters are consistent with elevated ventricular end-diastolic filling pressure.  ------------------------------------------------------------------- Aortic valve: Trileaflet; severely thickened, severely calcified leaflets. Valve mobility was restricted. Doppler: There was severe stenosis. There was no regurgitation. Mean gradient (S): 46 mm Hg. Peak gradient (S): 76 mm Hg.  ------------------------------------------------------------------- Aorta: Aortic root: The aortic root was normal in size. Ascending aorta: The ascending aorta was normal in size.  ------------------------------------------------------------------- Mitral valve: Mildly thickened, mildly calcified leaflets . Mobility was restricted. Doppler: Transvalvular velocity was within the normal range. There was no evidence for stenosis. There was mild regurgitation. Peak gradient (D): 7 mm Hg.  ------------------------------------------------------------------- Left  atrium: The atrium was moderately dilated.  ------------------------------------------------------------------- Right ventricle: The cavity size was normal. Wall thickness was normal. Systolic function was normal.  ------------------------------------------------------------------- Pulmonic valve: Structurally normal valve. Cusp separation was normal. Doppler: Transvalvular velocity was within the normal range. There was no evidence for stenosis. There was no regurgitation.  ------------------------------------------------------------------- Tricuspid valve: Structurally normal valve. Doppler: Transvalvular velocity was within the normal range. There was moderate regurgitation.  ------------------------------------------------------------------- Pulmonary artery: The main pulmonary artery was normal-sized. Systolic pressure was moderately increased.  ------------------------------------------------------------------- Right atrium: The atrium was mildly dilated.  ------------------------------------------------------------------- Pericardium: There was no pericardial effusion.  ------------------------------------------------------------------- Systemic veins: Inferior vena cava: The vessel was normal in size.  ------------------------------------------------------------------- Measurements  Left ventricle Value Reference LV ID, ED, PLAX chordal 46.3 mm 43 - 52 LV ID, ES, PLAX chordal 32.7 mm 23 - 38 LV fx shortening, PLAX chordal 29 % >=29 LV PW thickness, ED 15.1 mm --------- IVS/LV PW ratio, ED 0.72 <=1.3 LV e&', lateral 7.82 cm/s --------- LV E/e&', lateral 17.26 --------- LV e&', medial 4.05 cm/s --------- LV E/e&',  medial 33.33 --------- LV e&', average 5.94 cm/s --------- LV E/e&', average 22.75 ---------  Ventricular septum Value Reference IVS thickness, ED 10.8 mm ---------  LVOT Value Reference LVOT ID, S 22 mm --------- LVOT area 3.8 cm^2 ---------  Aortic valve Value Reference Aortic valve peak velocity, S 435 cm/s --------- Aortic valve mean velocity, S 322 cm/s --------- Aortic valve VTI, S 107 cm --------- Aortic mean gradient, S 46 mm Hg --------- Aortic peak gradient, S 76 mm Hg ---------  Aorta Value Reference Aortic root ID, ED 31 mm ---------  Left atrium Value Reference LA ID, A-P, ES 42 mm --------- LA ID/bsa, A-P (H) 2.42 cm/m^2 <=2.2 LA volume, S 126 ml --------- LA volume/bsa, S 72.5 ml/m^2 --------- LA volume, ES, 1-p A4C 112 ml --------- LA volume/bsa, ES, 1-p A4C 64.4 ml/m^2 --------- LA volume, ES, 1-p A2C 133 ml --------- LA volume/bsa, ES, 1-p A2C 76.5 ml/m^2 ---------  Mitral valve Value Reference Mitral E-wave peak velocity 135 cm/s --------- Mitral A-wave peak velocity 124 cm/s --------- Mitral deceleration time 208 ms 150 - 230 Mitral peak gradient, D 7 mm Hg --------- Mitral E/A ratio, peak  1.1 ---------  Pulmonary arteries Value Reference PA pressure, S, DP (H) 48 mm Hg <=30  Tricuspid valve Value Reference Tricuspid regurg peak velocity 335 cm/s --------- Tricuspid peak RV-RA gradient 45 mm Hg ---------  Systemic veins Value Reference Estimated CVP 3 mm Hg ---------  Right ventricle Value Reference TAPSE 24.4 mm --------- RV pressure, S, DP (H) 48 mm Hg <=30 RV s&', lateral, S 10.1 cm/s ---------  Legend: (L) and (H) mark values outside specified reference range.  ------------------------------------------------------------------- Prepared and Electronically Authenticated by  Angela Trujillo, M.D. 2017-11-30T14:11:49  Right/Left Heart Cath and Coronary Angiography  Conclusion   1. Patent coronary arteries with minimal nonobstructive CAD 2. Severe calcific aortic stenosis with mean gradient 40 mmHg and AVA calculated 0.97 square cm 3. Severe pulmonary HTN likely secondary to left heart disease (PVR 2 Woods Units)  Plan: Continued evaluation for TAVR.   Indications   Severe aortic stenosis [I35.0 (ICD-10-CM)]  Procedural Details/Technique   Technical Details INDICATION: Severe symptomatic aortic stenosis (Stage D)  PROCEDURAL DETAILS: The right groin was prepped, draped, and anesthetized with 1% lidocaine. Using the modified Seldinger technique a 5 French sheath was placed in the right femoral artery and a 7 French sheath was placed in the right femoral vein. A Swan-Ganz catheter was used for the right heart catheterization. Standard protocol was followed for recording of right heart pressures and sampling of oxygen saturations. Fick  cardiac output was calculated. Standard Judkins catheters were used for selective coronary angiography and left ventriculography. Aortic root angiography was performed. The aortic valve was crossed with an AL-1 catheter and a straight wire then changed out for a pigtail catheter. There were no immediate procedural complications. The patient was transferred to the post catheterization recovery area for further monitoring.     Estimated blood loss <50 mL.  During this procedure the patient was administered the following to achieve and maintain moderate conscious sedation: Versed 0.5 mg, Fentanyl 12.5 mcg, while the patient's heart rate, blood pressure, and oxygen saturation were continuously monitored. The period of conscious sedation was 43 minutes, of which I was present face-to-face 100% of this time.    Coronary Findings   Dominance: Right  Left Anterior Descending  The vessel exhibits minimal luminal irregularities.  Ramus Intermedius  The vessel exhibits minimal luminal irregularities.  Left Circumflex  The vessel exhibits minimal luminal irregularities.  Right Coronary Artery  The vessel exhibits minimal luminal irregularities.  Right Heart   Right Heart Pressures Hemodynamic findings consistent with moderate pulmonary hypertension.    Wall Motion              Left Heart   Left Ventricle The left ventricular size is normal. The left ventricular systolic function is normal. LV end diastolic pressure is normal. The left ventricular ejection fraction is 50-55% by visual estimate. No regional wall motion abnormalities. Low normal LV systolic function, LVEF [id number]    Aortic Valve There is severe aortic valve stenosis. There is no aortic valve regurgitation. The aortic valve is calcified. There is restricted aortic valve motion. The aortic valve is not congenitally bicuspid. Severe aortic stenosis with peak-to-peak gradient 46 mmHg, mean gradient 40 mmHg, and AVA 0.97 square cm      Coronary Diagrams   Diagnostic Diagram     Implants        No implant documentation for this case.  PACS Images   Show images for Cardiac catheterization   Link to Procedure Log   Procedure Log    Hemo Data   Flowsheet Row Most Recent Value  Fick Cardiac Output 6.59 L/min  Fick Cardiac Output Index 3.83 (L/min)/BSA  RA A Wave 11 mmHg  RA V Wave 10 mmHg  RA Mean 8 mmHg  RV Systolic Pressure 68 mmHg  RV Diastolic Pressure 3 mmHg  RV EDP 10 mmHg  PA Systolic Pressure 69 mmHg  PA Diastolic Pressure 22 mmHg  PA Mean 41 mmHg  PW A Wave 20 mmHg  PW V Wave 24 mmHg  PW Mean 17 mmHg  AO Systolic Pressure 123XX123  mmHg  AO Diastolic Pressure 70 mmHg  AO Mean 0000000 mmHg  LV Systolic Pressure 99991111 mmHg  LV Diastolic Pressure 12 mmHg  LV EDP 26 mmHg  Arterial Occlusion Pressure Extended Systolic Pressure [id number] mmHg  Arterial Occlusion Pressure Extended Diastolic Pressure 67 mmHg  Arterial Occlusion Pressure Extended Mean Pressure 103 mmHg  Left Ventricular Apex Extended Systolic Pressure 123456 mmHg  Left Ventricular Apex Extended Diastolic Pressure 20 mmHg  Left Ventricular Apex Extended EDP Pressure 30 mmHg  QP/QS 1  TPVR Index 10.7 HRUI  TSVR Index 28.44 HRUI  PVR SVR Ratio 0.16  TPVR/TSVR Ratio 0.38    Cardiac TAVR CT  TECHNIQUE: The patient was scanned on a Philips 256 scanner. A 120 kV retrospective scan was triggered in the descending thoracic aorta at 111 HU's. Gantry rotation speed was 270 msecs and collimation was .9 mm. No beta blockade or nitro were given. The 3D data set was reconstructed in 5% intervals of the R-R cycle. Systolic and diastolic phases were analyzed on a dedicated work station using MPR, MIP and VRT modes. The patient received 80 cc of contrast.  FINDINGS: Aortic Valve: Severely thickened and calcified aortic valve with severely restricted valve opening and mild symmetric calcifications extending into the LVOT.  Aorta: Normal size  with mild diffuse calcifications and no dissection.  Sinotubular Junction: 31 x 31 mm  Ascending Thoracic Aorta: 34 x 33 mm  Aortic Arch: 31 x 26 mm  Descending Thoracic Aorta: 21 x 21 mm  Sinus of Valsalva Measurements:  Non-coronary: 32 mm  Right -coronary: 31 mm  Left -coronary: 34 mm  Coronary Artery Height above Annulus:  Left Main: 11 mm  Right Coronary: 14 mm  Virtual Basal Annulus Measurements:  Maximum/Minimum Diameter: 28 x 22 mm  Perimeter: 88 mm  Area: 458 mm2  Optimum Fluoroscopic Angle for Delivery: LAO 13 CAU 13  IMPRESSION: 1. Severely thickened and calcified aortic valve with severely restricted valve opening and mild symmetric calcifications extending into the LVOT. Annular measurements suitable for delivery of a 26 mm Edwards-SAPIEN TAVR valve.  2. Sufficient annulus to coronary distance.  3. Optimum Fluoroscopic Angle for Delivery: LAO 13 CAU 13  Angela Trujillo   Electronically Signed By: Angela Trujillo On: 08/31/2016 17:45    CT ANGIOGRAPHY CHEST, ABDOMEN AND PELVIS  TECHNIQUE: Multidetector CT imaging through the chest, abdomen and pelvis was performed using the standard protocol during bolus administration of intravenous contrast. Multiplanar reconstructed images and MIPs were obtained and reviewed to evaluate the vascular anatomy.  CONTRAST: 75 mL of Isovue 370.  COMPARISON: No priors.  FINDINGS: CTA CHEST FINDINGS  Cardiovascular: Heart size is mildly enlarged with some concentric left ventricular hypertrophy. Severe thickening calcification of the aortic valve. There is no significant pericardial fluid, thickening or pericardial calcification. Atherosclerosis of the thoracic aorta and the great vessels of the mediastinum.  Mediastinum/Lymph Nodes: Numerous borderline enlarged and mildly enlarged mediastinal and hilar lymph nodes are noted on today's examination,  measuring up to 12 mm in short axis in the low right paratracheal and right hilar nodal stations. Esophagus is unremarkable in appearance. No axillary lymphadenopathy.  Lungs/Pleura: Scattered areas of linear scarring are noted in the lungs bilaterally, most evident in the dependent portions of the left lower lobe. No confluent consolidative airspace disease. No pleural effusions. 9 x 5 mm (mean diameter of 7 mm) subpleural nodule in the posterior aspect of the right lower lobe (image 32 of series 407). No other larger suspicious appearing pulmonary  nodules or masses are noted.  Musculoskeletal/Soft Tissues: There are no aggressive appearing lytic or blastic lesions noted in the visualized portions of the skeleton.  CTA ABDOMEN AND PELVIS FINDINGS  Hepatobiliary: The liver has a slightly shrunken appearance and nodular contour, suggesting underlying cirrhosis. No discrete cystic or solid hepatic lesions are noted. No intra or extrahepatic biliary ductal dilatation. Several densely calcified gallstones are noted lying dependently in the gallbladder neck.  Pancreas: In the head and proximal body of the pancreas there are 2 well-defined low-attenuation areas measuring up to 12 x 25 mm (image 160 of series 401), which are incompletely characterized, but favored to represent pancreatic pseudocysts. Throughout the distal body and tail of the pancreas there are innumerable cystic appearing areas, which is favored to reflect extreme side branch ectasia. Atrophy of the pancreatic parenchyma throughout the distal body and tail of the pancreas. Notably, the intervening pancreatic duct is normal in caliber in the proximal body and head of the pancreas. No peripancreatic inflammatory changes.  Spleen: Unremarkable.  Adrenals/Urinary Tract: Mild bilateral adrenal gland thickening. Mild parenchymal atrophy in both kidneys. Nonobstructive calculi in the collecting systems of the kidneys  bilaterally, measuring up to 5 mm in the interpolar collecting system of the right kidney. Subcentimeter low-attenuation lesions in the kidneys bilaterally, too small to characterize, but statistically likely tiny cysts. No hydroureteronephrosis. Urinary bladder is nearly completely decompressed, with a small amount of gas non dependently. No indwelling Foley catheter.  Stomach/Bowel: The appearance of the stomach is normal. There is no pathologic dilatation of small bowel or colon. Numerous colonic diverticulae are noted throughout the colon, without surrounding inflammatory changes to suggest an acute diverticulitis at this time. The appendix is not confidently identified and may be surgically absent. Regardless, there are no inflammatory changes noted adjacent to the cecum to suggest the presence of an acute appendicitis at this time.  Vascular/Lymphatic: Aortic atherosclerosis, with vascular findings and measurements pertinent to potential TAVR procedure, as detailed below. No aneurysm or dissection is identified in the abdominal or pelvic vasculature. Separate origin of the common hepatic artery and the splenic artery/left gastric artery (normal anatomical variant) incidentally noted. All mesenteric branches appear widely patent, without hemodynamically significant stenosis. Single renal arteries bilaterally with prominent atherosclerotic plaques at their origins, which appear nonobstructive. No lymphadenopathy noted in the abdomen or pelvis.  Reproductive: Status post hysterectomy. Ovaries are atrophic.  Other: No significant volume of ascites. No pneumoperitoneum.  Musculoskeletal: There are no aggressive appearing lytic or blastic lesions noted in the visualized portions of the skeleton.  VASCULAR MEASUREMENTS PERTINENT TO TAVR:  AORTA:  Minimal Aortic Diameter - 14 x 12 mm  Severity of Aortic Calcification - mild  RIGHT PELVIS:  Right Common Iliac  Artery -  Minimal Diameter - 10.9 x 10.3 mm  Tortuosity - mild  Calcification - mild  Right External Iliac Artery -  Minimal Diameter - 8.1 x 7.7 mm  Tortuosity - severe  Calcification - none  Right Common Femoral Artery -  Minimal Diameter - 7.1 x 8.5 mm  Tortuosity - mild  Calcification - mild  LEFT PELVIS:  Left Common Iliac Artery -  Minimal Diameter - 9.6 x 10.0 mm  Tortuosity - mild  Calcification - mild  Left External Iliac Artery -  Minimal Diameter - 8.0 x 6.8 mm  Tortuosity - moderate  Calcification - none  Left Common Femoral Artery -  Minimal Diameter - 7.3 x 6.7 mm  Tortuosity - mild  Calcification -  mild  Review of the MIP images confirms the above findings.  IMPRESSION: 1. Vascular findings and measurements pertinent to potential TAVR procedure, as detailed above. This patient appears to have suitable pelvic arterial access bilaterally. 2. Severe thickening and calcification of the aortic valve, compatible with the reported clinical history of severe aortic stenosis. 3. Highly unusual appearance of the pancreas. 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 Repeat evaluation with MRI of the abdomen with and without IV gadolinium is recommended in 3-6 months to ensure the stability of these findings. 4. Several borderline enlarged and mildly enlarged right hilar and mediastinal lymph nodes, as above are nonspecific. In addition, there is a 7 mm subpleural nodule in the posterior aspect of the right lower lobe (image 32 of series 407). Non-contrast chest CT at 6-12 months is recommended. If the nodule is stable at time of repeat CT, then future CT at 18-24 months (from today's  scan) is considered optional for low-risk patients, but is recommended for high-risk patients. This recommendation follows the consensus statement: Guidelines for Management of Incidental Pulmonary Nodules Detected on CT Images: From the Fleischner Society 2017; Radiology 2017; 284:228-243. 5. Small nonobstructive calculi in the collecting systems of the kidneys bilaterally measuring up to 5 mm in the interpolar collecting system of the right kidney. No ureteral stones or findings of urinary tract obstruction are noted at this time. 6. Small amount of gas non dependently in the lumen of the urinary bladder. This is nonspecific, but is typically seen in the setting of recent catheterization. Clinical correlation is recommended. If there has been no recent history of catheterization, correlation with urinalysis would be recommended to exclude the possibility of infection with gas-forming organisms. 7. Severe colonic diverticulosis without evidence of acute diverticulitis at this time. 8. Cardiomegaly with concentric left ventricular hypertrophy. 9. Additional incidental findings, as above.   Electronically Signed By: Angela Langton M.D. On: 09/01/2016 12:57   STS Risk Calculator  Procedure                                          AVR  Risk of Mortality                                16.6% Morbidity or Mortality                       53.3% Prolonged LOS                                   41.7% Short LOS                                           3.0% Permanent Stroke                             4.8% Prolonged Vent Support  49.8% DSW Infection                                     0.9% Renal Failure                                       n/a% Reoperation                                        19.4%   Impression:  Patient has stage D severe symptomatic aortic stenosis. She presents with stable symptoms of exertional shortness of breath and fatigue  consistent with chronic diastolic congestive heart failure, New York Heart Association functional class III. Symptoms are unquestionably multifactorial given her numerous comorbid medical problems including long-standing hypertension and dialysis dependent renal failure. I have personally reviewed the patient's recent transthoracic echocardiogram, diagnostic cardiac catheterization, and CT angiograms. Transthoracic echocardiogram demonstrates severe thickening, calcification, and restricted leaflet mobility involving all 3 leaflets of the patient's aortic valve. Peak velocity across the aortic valve measured well above 4 m/s corresponding to mean transvalvular gradient ranging between 45 and 50 mmHg. Left ventricular systolic function remains preserved. Diagnostic cardiac catheterization confirmed the presence of severe aortic stenosis and was notable for the absence of significant coronary artery disease. The patient does have moderate to severe pulmonary hypertension and likely significant diastolic dysfunction. I agree the patient would benefit from aortic valve replacement. Risks associated with conventional surgery would be very high in this elderly patient with dialysis-dependent renal failure. I would not consider this patient a candidate for conventional surgery. Cardiac gated CT angiogram of the heart confirms findings consistent with severe aortic stenosis with anatomical characteristics suitable for transcatheter aortic valve replacement without any significant complicating features. CT angiogram of the aorta and iliac vessels demonstrates adequate pelvic vascular access to facilitate a transfemoral approach. I agree that transcatheter aortic valve replacement would be the best option for this patient.   Plan:  The patient and 2 of her sons were counseled at length regarding treatment alternatives for management of severe symptomatic aortic stenosis. Alternative approaches such as conventional  aortic valve replacement, transcatheter aortic valve replacement, and palliative medical therapy were compared and contrasted at length.  The risks associated with conventional surgical aortic valve replacement were been discussed in detail, as were reasons why I would be reluctant to consider conventional surgery in this patient. Long-term prognosis with medical therapy was discussed. This discussion was placed in the context of the patient's own specific clinical presentation and past medical history.  All of their questions been addressed.  The patient hopes to proceed with transcatheter aortic valve replacement on Tuesday, 09/29/2016.  Following the decision to proceed with transcatheter aortic valve replacement, a discussion has been held regarding what types of management strategies would be attempted intraoperatively in the event of life-threatening complications, including whether or not the patient would be considered a candidate for the use of cardiopulmonary bypass and/or conversion to open sternotomy for attempted surgical intervention.  The patient has been advised of a variety of complications that might develop including but not limited to risks of death, stroke, paravalvular leak, aortic dissection or other major vascular complications, aortic annulus rupture, device embolization, cardiac rupture or perforation, mitral regurgitation,  acute myocardial infarction, arrhythmia, heart block or bradycardia requiring permanent pacemaker placement, congestive heart failure, respiratory failure, renal failure, pneumonia, infection, other late complications related to structural valve deterioration or migration, or other complications that might ultimately cause a temporary or permanent loss of functional independence or other long term morbidity.  The patient provides full informed consent for the procedure as described and all questions were answered.  At some point the patient needs to be seen by her  dentist. She has no acute ongoing problems with her remaining teeth at this time but she has not seen a dentist in several years.  The patient has been advised regarding the importance of dental hygiene and the lifelong need for antibiotic prophylaxis for all dental cleanings and other related invasive procedures.    I spent in excess of 90 minutes during the conduct of this office consultation and >50% of this time involved direct face-to-face encounter with the patient for counseling and/or coordination of their care.    Angela Gu. Roxy Manns, Angela Trujillo 09/16/2016 4:54 PM

## 2016-09-25 NOTE — Transfer of Care (Signed)
Immediate Anesthesia Transfer of Care Note  Patient: Angela Trujillo  Procedure(s) Performed: Procedure(s): EXTRACTION of tooth number 31 WITH ALVEOLOPLASTY AND GROSS DEBRIDEMENT OF REMAINING TEETH (N/A)  Patient Location: PACU  Anesthesia Type:General  Level of Consciousness: alert , oriented and sedated  Airway & Oxygen Therapy: Patient Spontanous Breathing and Patient connected to nasal cannula oxygen  Post-op Assessment: Report given to RN, Post -op Vital signs reviewed and stable and Patient moving all extremities X 4  Post vital signs: Reviewed and stable  Last Vitals:  Vitals:   09/25/16 0831  BP: (!) 143/57  Pulse: 67  Resp: 18  Temp: 36.9 C    Last Pain:  Vitals:   09/25/16 0831  TempSrc: Oral      Patients Stated Pain Goal: 3 ([id number])  Complications: No apparent anesthesia complications

## 2016-09-25 NOTE — Discharge Instructions (Signed)

## 2016-09-25 NOTE — Anesthesia Preprocedure Evaluation (Addendum)
Anesthesia Evaluation  Patient identified by MRN, date of birth, ID band Patient awake    Reviewed: Allergy & Precautions, NPO status , Patient's Chart, lab work & pertinent test results  Airway Mallampati: II  TM Distance: >3 FB     Dental   Pulmonary shortness of breath, pneumonia,    breath sounds clear to auscultation       Cardiovascular hypertension,  Rhythm:Regular Rate:Normal     Neuro/Psych    GI/Hepatic Neg liver ROS, GERD  ,  Endo/Other  diabetes  Renal/GU Renal disease     Musculoskeletal   Abdominal   Peds  Hematology   Anesthesia Other Findings   Reproductive/Obstetrics                             Anesthesia Physical Anesthesia Plan  ASA: IV  Anesthesia Plan: General   Post-op Pain Management:    Induction: Intravenous  Airway Management Planned: Oral ETT  Additional Equipment:   Intra-op Plan:   Post-operative Plan: Possible Post-op intubation/ventilation  Informed Consent: I have reviewed the patients History and Physical, chart, labs and discussed the procedure including the risks, benefits and alternatives for the proposed anesthesia with the patient or authorized representative who has indicated his/her understanding and acceptance.   Dental advisory given  Plan Discussed with: CRNA, Anesthesiologist and Surgeon  Anesthesia Plan Comments:         Anesthesia Quick Evaluation

## 2016-09-25 NOTE — Anesthesia Procedure Notes (Addendum)
Procedure Name: Intubation Date/Time: 09/25/2016 10:55 AM Performed by: Gaylene Brooks Pre-anesthesia Checklist: Patient identified, Emergency Drugs available, Suction available, Patient being monitored and Timeout performed Patient Re-evaluated:Patient Re-evaluated prior to inductionOxygen Delivery Method: Circle system utilized Preoxygenation: Pre-oxygenation with 100% oxygen Intubation Type: IV induction Ventilation: Oral airway inserted - appropriate to patient size and Mask ventilation without difficulty Laryngoscope Size: Mac and 3 Grade View: Grade II Tube type: Oral Number of attempts: 2 Airway Equipment and Method: Stylet Placement Confirmation: ETT inserted through vocal cords under direct vision,  positive ETCO2,  CO2 detector and breath sounds checked- equal and bilateral Secured at: 22 cm Tube secured with: Tape Dental Injury: Teeth and Oropharynx as per pre-operative assessment  Comments: Failed 1st attempt by SRNA with Miller 2, successful 2nd attempt with mac 3 G2

## 2016-09-25 NOTE — Op Note (Signed)
OPERATIVE REPORT    Patient:            Angela Trujillo Date of Birth:  [birth year] MRN:                [id number]   DATE OF PROCEDURE:  09/25/2016  PREOPERATIVE DIAGNOSES: 1.  Severe aortic stenosis 2. Pre-heart valve surgery dental protocol 3. Chronic apical periodontitis 4. Dental caries 5. Retained root segment 6. Chronic periodontitis 7. Accretions  POSTOPERATIVE DIAGNOSES: 1.  Severe aortic stenosis 2. Pre-heart valve surgery dental protocol 3. Chronic apical periodontitis 4. Dental caries 5. Retained root segment 6. Chronic periodontitis 7. Accretions  OPERATIONS: 1. Extraction of tooth #31 with alveoloplasty 2. Full mouth debridement of remaining dentition    SURGEON: Lenn Cal, DDS  ASSISTANT: Camie Patience, (dental assistant)  ANESTHESIA: General anesthesia via oral endotracheal tube.  MEDICATIONS: 1. Clindamycin 600 mg IV prior to invasive dental procedures. 2. Local anesthesia with a total utilization of 2 carpules each containing 34 mg of lidocaine with 0.017 mg of epinephrine as well as 2 carpules each containing 9 mg of bupivacaine with 0.009 mg of epinephrine.  SPECIMENS: There was 1 tooth that was discarded.  DRAINS: None  CULTURES: None  COMPLICATIONS: None   ESTIMATED BLOOD LOSS: Less than 50 mLs.  INTRAVENOUS FLUIDS: 500 mLs of normal saline solution  INDICATIONS: The patient was recently diagnosed with severe aortic stenosis.  A medically necessary dental consultation was then requested to evaluate poor dentition.  The patient was examined and treatment planned for extraction of tooth #31 with alveoloplasty and gross debridement of remaining dentition in the operating room with general anesthesia.  This treatment plan was formulated to decrease the risks and complications associated with dental infection from affecting the patient's systemic health and anticipated heart valve surgery.  OPERATIVE FINDINGS: Patient was examined  operating room number 10.  The teeth were identified for extraction. The patient was noted be affected by chronic periodontitis, accretions, dental caries, retained root segments, and chronic apical periodontitis.   DESCRIPTION OF PROCEDURE: Patient was brought to the main operating room number 10. Patient was then placed in the supine position on the operating table. General anesthesia was then induced per the anesthesia team. The patient was then prepped and draped in the usual manner for dental medicine procedure. A timeout was performed. The patient was identified and procedures were verified. A throat pack was placed at this time. The oral cavity was then thoroughly examined with the findings noted above. The patient was then ready for dental medicine procedure as follows:  Local anesthesia was then administered sequentially with a total utilization of 2 carpules each containing 34 mg of lidocaine with 0.017 mg of epinephrine as well as 2 carpules  each containing 9 mg bupivacaine with 0.009 mg of epinephrine.  At this point time, the mandibular right quadrant was approached and the patient was given an inferior alveolar nerve block utilizing the bupivacaine with epinephrine.  Further infiltration was then achieved utilizing the lidocaine with epinephrine. A 15 blade incision was then made from the distal of number 32 and extended to the distal of #30 .  A surgical flap was then carefully reflected. Tooth #31 was then subluxated with a series of straight elevators. Tooth #31 was then removed with a 151 forceps without complications. The socket was curetted and compressed appropriately. Alveoloplasty was then performed utilizing a rongeurs and bone file from the distal of #32 to the distal of #30 to help achieve  primary closure. The tissues were approximated and trimmed appropriately. The surgical sites were then irrigated with copious amounts of sterile saline. A piece of Surgifoam was then placed in  the extraction socket. The mandibular right surgical site was then closed from the distal of #32 and extended to the distal of #30 utilizing 3-0 chromic gut suture in a continuous suture technique 1.  At this point time, a gross debridement was performed utilizing a KaVo sonic scaler to remove significant accretions. A series of hand curettes were then used to further remove accretions. A sonic scaler was then again used to further define of accretions.   At this point time, the entire mouth was irrigated with copious amounts of sterile saline. The patient was examined for complications, seeing none, the dental medicine procedure was deemed to be complete. The throat pack was removed at this time. A series of 4 x 4 gauze were placed in the mouth to aid hemostasis. The patient was then handed over to the anesthesia team for final disposition. After an appropriate amount of time, the patient was extubated and taken to the postanesthsia care unit in good condition. All counts were correct for the dental medicine procedure.   Lenn Cal, DDS.

## 2016-09-25 NOTE — Progress Notes (Signed)
PRE-OPERATIVE NOTE:  09/25/2016 Ledell Peoples [id number]  VITALS: BP (!) 143/57   Pulse 67   Temp 98.5 F (36.9 C) (Oral) Comment: marilene  Resp 18   Wt 144 lb (65.3 kg)   SpO2 99%   BMI 23.96 kg/m   Lab Results  Component Value Date   WBC 7.5 09/25/2016   HGB 11.0 (L) 09/25/2016   HCT 31.9 (L) 09/25/2016   MCV 90.4 09/25/2016   PLT 143 (L) 09/25/2016   BMET    Component Value Date/Time   NA 139 08/19/2016 1334   K 4.6 08/19/2016 1334   CL 103 08/19/2016 1334   CO2 24 08/19/2016 1334   GLUCOSE 108 (H) 08/19/2016 1334   BUN 47 (H) 08/19/2016 1334   CREATININE 8.22 (H) 08/19/2016 1334   CALCIUM 8.7 (L) 08/19/2016 1334   CALCIUM 7.4 (L) 12/14/2015 0536   GFRNONAA 4 (L) 08/19/2016 1334   GFRAA 5 (L) 08/19/2016 1334    Lab Results  Component Value Date   INR 1.09 08/19/2016   No results found for: PTT  Lab Results  Component Value Date   HGBA1C 6.5 (H) 04/15/2013   Lab Results  Component Value Date   WBC 7.5 09/25/2016   HGB 11.0 (L) 09/25/2016   HCT 31.9 (L) 09/25/2016   PLT 143 (L) 09/25/2016   GLUCOSE 108 (H) 08/19/2016   CHOL 181 04/15/2013   TRIG 91 04/15/2013   HDL 62 04/15/2013   LDLCALC 101 (H) 04/15/2013   ALT 9 (L) 12/21/2015   AST 19 12/21/2015   NA 139 08/19/2016   K 4.6 08/19/2016   CL 103 08/19/2016   CREATININE 8.22 (H) 08/19/2016   BUN 47 (H) 08/19/2016   CO2 24 08/19/2016   TSH 1.491 12/14/2015   INR 1.13 09/25/2016   HGBA1C 6.5 (H) 04/15/2013     Ledell Peoples presents for extraction of indicated teeth with alveoloplasty and gross debridement of remaining dentition in the operating room with general anesthesia.   SUBJECTIVE: The patient denies any acute medical or dental changes and agrees to proceed with treatment as planned.  EXAM: No sign of acute dental changes.  ASSESSMENT: Patient is affected by chronic apical periodontitis, dental caries, retained root segment, chronic periodontitis, and  accretions.  PLAN: Patient agrees to proceed with treatment as planned in the operating room as previously discussed and accepts the risks, benefits, and complications of the proposed treatment. Patient is aware of the risk for bleeding, bruising, swelling, infection, pain, nerve damage, soft tissue damage, damage to adjacent teeth, sinus involvement, root tip fracture, mandible fracture, and the risks of complications associated with the anesthesia. Patient also is aware of the potential for other complications up to and including death due to overall cardiovascular and respiratory compromise.    Lenn Cal, DDS

## 2016-09-26 ENCOUNTER — Encounter (HOSPITAL_COMMUNITY): Payer: Self-pay | Admitting: Dentistry

## 2016-09-26 LAB — HEMOGLOBIN A1C
Hgb A1c MFr Bld: 8.5 % — ABNORMAL HIGH (ref 4.8–5.6)
Mean Plasma Glucose: 197 mg/dL

## 2016-09-28 ENCOUNTER — Encounter (HOSPITAL_COMMUNITY): Payer: Self-pay | Admitting: *Deleted

## 2016-09-28 ENCOUNTER — Ambulatory Visit (HOSPITAL_COMMUNITY)
Admission: RE | Admit: 2016-09-28 | Discharge: 2016-09-28 | Disposition: A | Discharge status: Home / Self Care | Payer: Medicare Other | Source: Ambulatory Visit | Attending: Cardiovascular Disease | Admitting: Cardiovascular Disease

## 2016-09-28 ENCOUNTER — Other Ambulatory Visit: Payer: Self-pay | Admitting: *Deleted

## 2016-09-28 ENCOUNTER — Telehealth (HOSPITAL_COMMUNITY): Payer: Self-pay | Admitting: Dentistry

## 2016-09-28 DIAGNOSIS — I350 Nonrheumatic aortic (valve) stenosis: Secondary | ICD-10-CM | POA: Insufficient documentation

## 2016-09-28 LAB — PULMONARY FUNCTION TEST
DL/VA % PRED: 58 %
DL/VA: 2.88 ml/min/mmHg/L
DLCO COR: 9.9 ml/min/mmHg
DLCO cor % pred: 38 %
DLCO unc % pred: 35 %
DLCO unc: 9.08 ml/min/mmHg
FEF 25-75 POST: 1.5 L/s
FEF 25-75 Pre: 0.64 L/sec
FEF2575-%Change-Post: 135 %
FEF2575-%PRED-POST: 120 %
FEF2575-%PRED-PRE: 50 %
FEV1-%CHANGE-POST: 26 %
FEV1-%Pred-Post: 88 %
FEV1-%Pred-Pre: 69 %
FEV1-POST: 1.38 L
FEV1-Pre: 1.08 L
FEV1FVC-%CHANGE-POST: 2 %
FEV1FVC-%PRED-PRE: 91 %
FEV6-%Change-Post: 25 %
FEV6-%Pred-Post: 102 %
FEV6-%Pred-Pre: 81 %
FEV6-Post: 1.96 L
FEV6-Pre: 1.56 L
FEV6FVC-%Change-Post: 0 %
FEV6FVC-%Pred-Post: 104 %
FEV6FVC-%Pred-Pre: 104 %
FVC-%Change-Post: 23 %
FVC-%PRED-POST: 98 %
FVC-%PRED-PRE: 79 %
FVC-POST: 1.96 L
FVC-PRE: 1.59 L
PRE FEV1/FVC RATIO: 68 %
PRE FEV6/FVC RATIO: 100 %
Post FEV1/FVC ratio: 70 %
Post FEV6/FVC ratio: 100 %
RV % pred: 246 %
RV: 6.19 L
TLC % pred: 152 %
TLC: 7.92 L

## 2016-09-28 MED ORDER — DEXTROSE 5 % IV SOLN
1.5000 g | INTRAVENOUS | Status: AC
  Administered 2016-09-29: 1.5 g via INTRAVENOUS
  Filled 2016-09-28: qty 1.5

## 2016-09-28 MED ORDER — DEXTROSE 5 % IV SOLN
30.0000 ug/min | INTRAVENOUS | Status: DC
  Filled 2016-09-28: qty 2

## 2016-09-28 MED ORDER — DEXMEDETOMIDINE HCL IN NACL 400 MCG/100ML IV SOLN
0.1000 ug/kg/h | INTRAVENOUS | Status: AC
  Administered 2016-09-29: .4 ug/kg/h via INTRAVENOUS
  Filled 2016-09-28: qty 100

## 2016-09-28 MED ORDER — NITROGLYCERIN IN D5W 200-5 MCG/ML-% IV SOLN
2.0000 ug/min | INTRAVENOUS | Status: DC
  Filled 2016-09-28: qty 250

## 2016-09-28 MED ORDER — DOPAMINE-DEXTROSE 3.2-5 MG/ML-% IV SOLN
0.0000 ug/kg/min | INTRAVENOUS | Status: DC
  Filled 2016-09-28: qty 250

## 2016-09-28 MED ORDER — SODIUM CHLORIDE 0.9 % IV SOLN
INTRAVENOUS | Status: DC
  Filled 2016-09-28: qty 30

## 2016-09-28 MED ORDER — ALBUTEROL SULFATE (2.5 MG/3ML) 0.083% IN NEBU
2.5000 mg | INHALATION_SOLUTION | Freq: Once | RESPIRATORY_TRACT | Status: AC
  Administered 2016-09-28: 2.5 mg via RESPIRATORY_TRACT

## 2016-09-28 MED ORDER — CHLORHEXIDINE GLUCONATE 0.12 % MT SOLN
15.0000 mL | Freq: Once | OROMUCOSAL | Status: AC
  Administered 2016-09-29: 15 mL via OROMUCOSAL
  Filled 2016-09-28: qty 15

## 2016-09-28 MED ORDER — NOREPINEPHRINE BITARTRATE 1 MG/ML IV SOLN
0.0000 ug/min | INTRAVENOUS | Status: DC
  Filled 2016-09-28: qty 4

## 2016-09-28 MED ORDER — SODIUM CHLORIDE 0.9 % IV SOLN
INTRAVENOUS | Status: DC
  Filled 2016-09-28: qty 2.5

## 2016-09-28 MED ORDER — VANCOMYCIN HCL 10 G IV SOLR
1250.0000 mg | INTRAVENOUS | Status: AC
  Administered 2016-09-29: 1250 mg via INTRAVENOUS
  Filled 2016-09-28: qty 1250

## 2016-09-28 MED ORDER — MUPIROCIN 2 % EX OINT
1.0000 "application " | TOPICAL_OINTMENT | CUTANEOUS | Status: AC
  Administered 2016-09-29: 1 via TOPICAL
  Filled 2016-09-28: qty 22

## 2016-09-28 MED ORDER — EPINEPHRINE PF 1 MG/ML IJ SOLN
0.0000 ug/min | INTRAVENOUS | Status: DC
  Filled 2016-09-28: qty 4

## 2016-09-28 MED ORDER — MAGNESIUM SULFATE 50 % IJ SOLN
40.0000 meq | INTRAMUSCULAR | Status: DC
  Filled 2016-09-28: qty 10

## 2016-09-28 MED ORDER — SODIUM CHLORIDE 0.9 % IV SOLN
INTRAVENOUS | Status: DC
  Administered 2016-09-29: 10:00:00 via INTRAVENOUS

## 2016-09-28 MED ORDER — POTASSIUM CHLORIDE 2 MEQ/ML IV SOLN
80.0000 meq | INTRAVENOUS | Status: DC
  Filled 2016-09-28: qty 40

## 2016-09-28 NOTE — Telephone Encounter (Signed)
09/28/16   Follow up call after surgical extractions.  Spoke w/pt's son Abagail Kitchens. Pt. doing well. Abagail Kitchens will call back after heart procedure w/Dr. Roxy Manns  to shcl. F/U appt. w/Dr. Enrique Sack.  L[facility]

## 2016-09-28 NOTE — Progress Notes (Signed)
Spoke with pt's son, Abagail Kitchens again today for pre-op call. Pt just had surgery on Friday, 09/25/16. Pre-op instructions given to [facility] for tomorrow's surgery. He voiced understanding.

## 2016-09-28 NOTE — H&P (Signed)
[name]       [name] [address]             [phone]      Cardiothoracic Surgery Admission History and Physical   Referring Provider is Sherren Mocha, MD PCP is Doretha Sou, MD      Chief Complaint  Patient presents with  . Aortic Stenosis        HPI:  The patient is an 83 year old woman with DM, hypertension, atrial flutter, and ESRD who has been on HD for the past three years. She developed pneumonia in February 2017 and was hospitalized at [facility] for several weeks before being transferred to [facility] her for about one month to recover. She has been back home since May 5th and her sons alternate staying with her. She has been fairly sedentary since but says that she can walk around her house and takes care of herself. She uses a wheelchair when out of the house. She had a 2D echo done on 07/03/2016 due to a heart murmur and this showed severe AS with a mean gradient of 51 mm Hg with an AVA of 0.4 cm2. The LVEF was 50-55% with mild to moderate MR, moderate TR and moderate to severe pulmonary hypertension. She was seen by Dr. Burt Knack and underwent cath on 08/19/2016 showing minimal non-obstructive CAD with a mean AV gradient of 40 mm Hg. There was severe pulmonary hypertension at 69/22 with a PVR of 2 Woods Units.  She reports shortness of breath with low-level activity in the house. She has not had any chest pain or tightness, recent dizziness or syncope. Her sons do report two episodes of syncope last year prior to her hospitalization in Feb. She has had some hypotension during dialysis that shortened the session. She has had some nausea towards the end of the dialysis session if she eats during the session.         Past Medical History:  Diagnosis Date  . Atrial flutter ([place])   . Diabetes mellitus with end stage renal disease ([place])   . ESRD (end stage renal disease) on dialysis ([place])   . Hypertension   .  Severe aortic stenosis 08/19/2016         Past Surgical History:  Procedure Laterality Date  . ABDOMINAL HYSTERECTOMY    . CARDIAC CATHETERIZATION N/A 08/19/2016   Procedure: Right/Left Heart Cath and Coronary Angiography;  Surgeon: Sherren Mocha, MD;  Location: [facility];  Service: Cardiovascular;  Laterality: N/A;  . IR GENERIC HISTORICAL  08/31/2016   IR RADIOLOGY PERIPHERAL GUIDED IV START 08/31/2016 Corrie Mckusick, DO [facility]  . TUBAL LIGATION    AV fistula insertion  Family History:   Negative for aortic valve disease.  Social History   Social History  . Marital status: Widowed    Spouse name: N/A  . Number of children: N/A  . Years of education: N/A      Occupational History  . Not on file.       Social History Main Topics  . Smoking status: Never Smoker  . Smokeless tobacco: Not on file  . Alcohol use No  . Drug use: Unknown  . Sexual activity: No       Other Topics Concern  . Not on file      Social History Narrative  . No narrative on file          Current Outpatient Prescriptions  Medication Sig Dispense Refill  . atorvastatin (LIPITOR) 20 MG tablet Take 20 mg by mouth at bedtime.    . calcium acetate (PHOSLO) 667 MG capsule Take 667 mg by mouth daily with breakfast.    . famotidine (PEPCID) 20 MG tablet Take 20 mg by mouth 2 (two) times daily.    . Insulin Detemir (LEVEMIR FLEXTOUCH) 100 UNIT/ML Pen Inject 14 Units into the skin 2 (two) times daily.    . metoprolol succinate (TOPROL-XL) 100 MG 24 hr tablet Take 100 mg by mouth daily.    . SENSIPAR 30 MG tablet Take 30 mg by mouth See admin instructions. The son believes they give this to her at dialysis on Tue, Thur, Sat.     No current facility-administered medications for this visit.          Allergies  Allergen Reactions  . Asa [Aspirin] Nausea Only    Makes stomach hurt also  . Codeine Rash  . Penicillins Rash    Has patient had a  PCN reaction causing immediate rash, facial/tongue/throat swelling, SOB or lightheadedness with hypotension: No Has patient had a PCN reaction causing severe rash involving mucus membranes or skin necrosis: No Has patient had a PCN reaction that required hospitalization: No Has patient had a PCN reaction occurring within the last 10 years: No If all of the above answers are "NO", then may proceed with Cephalosporin use.      Review of Systems:              General:                      normal appetite, decreased energy, no weight gain, no weight loss, no fever             Cardiac:                       no chest pain with exertion, no chest pain at rest, has SOB with mild exertion, no resting SOB, no PND, no orthopnea, no palpitations, no arrhythmia, no atrial fibrillation, no LE edema, no dizzy spells, prior syncope last year x 2.             Respiratory:                 exertional shortness of breath, no home oxygen, no productive cough, no dry cough, no bronchitis, no wheezing, no hemoptysis, no asthma, no pain with inspiration or cough, no sleep apnea, no CPAP at night             GI:                               no difficulty swallowing, no reflux, no frequent heartburn, no hiatal hernia, no abdominal pain, no constipation, no diarrhea, no hematochezia, no hematemesis, no melena             GU:                              no dysuria,  no frequency, no urinary tract infection, no hematuria,  no kidney stones, end stage kidney disease             Vascular:                     no pain suggestive  of claudication, no pain in feet, no leg cramps, no varicose veins, no DVT, no non-healing foot ulcer             Neuro:                         no stroke, no TIA's, no seizures, no headaches, no temporary blindness one eye,  no slurred speech, no peripheral neuropathy, no chronic pain, has instability of gait, no memory/cognitive dysfunction             Musculoskeletal:         no arthritis, no  joint swelling, no myalgias, has difficulty walking, reduced mobility              Skin:                            no rash, no itching, no skin infections, no pressure sores or ulcerations             Psych:                         no anxiety, no depression, on nervousness, no unusual recent stress             Eyes:                           no blurry vision, no floaters, no recent vision changes,  wears glasses or contacts             ENT:                            no hearing loss, no loose or painful teeth, partial dentures, last saw dentist several years ago             Hematologic:               no easy bruising, no abnormal bleeding, no clotting disorder, no frequent epistaxis             Endocrine:                   has diabetes, does not check CBG's at home                                                       Physical Exam:              BP 138/66   Pulse 74   Resp 20   Ht 5\' 5"  (1.651 m)   Wt 144 lb (65.3 kg)   SpO2 97% Comment: RA  BMI 23.96 kg/m              General:                      Elderly chronically ill-appearing woman in no distress.             HEENT:                       Unremarkable , NCAT, PERLA, EOMI, Oropharynx clear, remaining teeth in good condition  Neck:                           no JVD, no bruits, no adenopathy or thyromegaly             Chest:                          clear to auscultation, symmetrical breath sounds, no wheezes, no rhonchi              CV:                              RRR, grade III/VI crescendo/decrescendo murmur heard best at RSB,  no diastolic murmur             Abdomen:                    soft, non-tender, no masses or organomegaly             Extremities:                 warm, well-perfused, pulses palpable in feet, no LE edema             Rectal/GU                   Deferred             Neuro:                         Grossly non-focal and symmetrical throughout             Skin:                            Clean and  dry, no rashes, no breakdown   Diagnostic Tests:  *[facility]* *[facility]* [address], [place] [address] [phone]  ------------------------------------------------------------------- Transthoracic Echocardiography  Patient: Binti, Mollet MR #: [id number] Study Date: 08/19/2016 Gender: F Age: 14 Height: 165.1 cm Weight: 65.3 kg BSA: 1.74 m^2 Pt. Status: Room:  ADMITTING Sherren Mocha, MD ATTENDING Sherren Mocha, MD ORDERING Sherren Mocha, MD REFERRING Sherren Mocha, MD PERFORMING [facility], Inpatient SONOGRAPHER [facility]  cc:  ------------------------------------------------------------------- LV EF: 55% - 60%  ------------------------------------------------------------------- Indications: Aortic stenosis 424.1.  ------------------------------------------------------------------- History: PMH: Atrial flutter. Risk factors: Hypertension. Diabetes mellitus.  ------------------------------------------------------------------- Study Conclusions  - Left ventricle: The cavity size was normal. There was mild concentric hypertrophy. Systolic function was normal. The estimated ejection fraction was in the range of 55% to 60%. Wall motion was normal; there were no regional wall motion abnormalities. Features are consistent with a pseudonormal left ventricular filling pattern, with concomitant abnormal relaxation and increased filling pressure (grade 2 diastolic dysfunction). Doppler parameters are consistent with elevated ventricular end-diastolic filling pressure. - Aortic valve: Trileaflet; severely thickened, severely calcified leaflets. Valve mobility was restricted. There was severe stenosis. Mean gradient (S): 46 mm Hg.  Peak gradient (S): 76 mm Hg. - Aortic root: The aortic root was normal in size. - Ascending aorta: The ascending aorta was normal in size. - Mitral valve: Mildly thickened, mildly calcified leaflets . Mobility was restricted. There was mild regurgitation. - Left atrium: The atrium was moderately dilated. - Right ventricle: Systolic function was normal. - Right atrium: The atrium was mildly  dilated. - Tricuspid valve: There was moderate regurgitation. - Pulmonic valve: There was no regurgitation. - Pulmonary arteries: Systolic pressure was moderately increased. PA peak pressure: 48 mm Hg (S). - Inferior vena cava: The vessel was normal in size. - Pericardium, extracardiac: There was no pericardial effusion.  Impressions:  - Severe aortic stenosis. Moderate pulmonary hypertension.  ------------------------------------------------------------------- Study data: No prior study was available for comparison. Study status: Routine. Procedure: The patient reported no pain pre or post test. Transthoracic echocardiography. Image quality was adequate. Study completion: There were no complications. Transthoracic echocardiography. M-mode, complete 2D, spectral Doppler, and color Doppler. Birthdate: Patient birthdate: 06-Apr-1931. Age: Patient is 83 yr old. Sex: Gender: female. BMI: 24 kg/m^2. Blood pressure: 164/75 Patient status: Inpatient. Study date: Study date: 08/19/2016. Study time: 05:25 PM. Location: Bedside.  -------------------------------------------------------------------  ------------------------------------------------------------------- Left ventricle: The cavity size was normal. There was mild concentric hypertrophy. Systolic function was normal. The estimated ejection fraction was in the range of 55% to 60%. Wall motion was normal; there were no regional wall motion abnormalities. Features are consistent with a pseudonormal left  ventricular filling pattern, with concomitant abnormal relaxation and increased filling pressure (grade 2 diastolic dysfunction). Doppler parameters are consistent with elevated ventricular end-diastolic filling pressure.  ------------------------------------------------------------------- Aortic valve: Trileaflet; severely thickened, severely calcified leaflets. Valve mobility was restricted. Doppler: There was severe stenosis. There was no regurgitation. Mean gradient (S): 46 mm Hg. Peak gradient (S): 76 mm Hg.  ------------------------------------------------------------------- Aorta: Aortic root: The aortic root was normal in size. Ascending aorta: The ascending aorta was normal in size.  ------------------------------------------------------------------- Mitral valve: Mildly thickened, mildly calcified leaflets . Mobility was restricted. Doppler: Transvalvular velocity was within the normal range. There was no evidence for stenosis. There was mild regurgitation. Peak gradient (D): 7 mm Hg.  ------------------------------------------------------------------- Left atrium: The atrium was moderately dilated.  ------------------------------------------------------------------- Right ventricle: The cavity size was normal. Wall thickness was normal. Systolic function was normal.  ------------------------------------------------------------------- Pulmonic valve: Structurally normal valve. Cusp separation was normal. Doppler: Transvalvular velocity was within the normal range. There was no evidence for stenosis. There was no regurgitation.  ------------------------------------------------------------------- Tricuspid valve: Structurally normal valve. Doppler: Transvalvular velocity was within the normal range. There was moderate regurgitation.  ------------------------------------------------------------------- Pulmonary artery: The main  pulmonary artery was normal-sized. Systolic pressure was moderately increased.  ------------------------------------------------------------------- Right atrium: The atrium was mildly dilated.  ------------------------------------------------------------------- Pericardium: There was no pericardial effusion.  ------------------------------------------------------------------- Systemic veins: Inferior vena cava: The vessel was normal in size.  ------------------------------------------------------------------- Measurements  Left ventricle Value Reference LV ID, ED, PLAX chordal 46.3 mm 43 - 52 LV ID, ES, PLAX chordal 32.7 mm 23 - 38 LV fx shortening, PLAX chordal 29 % >=29 LV PW thickness, ED 15.1 mm --------- IVS/LV PW ratio, ED 0.72 <=1.3 LV e&', lateral 7.82 cm/s --------- LV E/e&', lateral 17.26 --------- LV e&', medial 4.05 cm/s --------- LV E/e&', medial 33.33 --------- LV e&', average 5.94 cm/s --------- LV E/e&', average 22.75 ---------  Ventricular septum Value Reference IVS thickness, ED 10.8 mm ---------  LVOT Value Reference LVOT ID, S 22 mm --------- LVOT area 3.8 cm^2 ---------  Aortic valve Value Reference Aortic valve peak velocity, S 435 cm/s --------- Aortic valve mean velocity, S 322 cm/s --------- Aortic valve VTI, S 107 cm --------- Aortic mean gradient, S 46 mm Hg  --------- Aortic peak gradient, S 76 mm Hg ---------  Aorta Value Reference Aortic root ID, ED 31 mm ---------  Left atrium Value Reference LA ID, A-P, ES 42  mm --------- LA ID/bsa, A-P (H) 2.42 cm/m^2 <=2.2 LA volume, S 126 ml --------- LA volume/bsa, S 72.5 ml/m^2 --------- LA volume, ES, 1-p A4C 112 ml --------- LA volume/bsa, ES, 1-p A4C 64.4 ml/m^2 --------- LA volume, ES, 1-p A2C 133 ml --------- LA volume/bsa, ES, 1-p A2C 76.5 ml/m^2 ---------  Mitral valve Value Reference Mitral E-wave peak velocity 135 cm/s --------- Mitral A-wave peak velocity 124 cm/s --------- Mitral deceleration time 208 ms 150 - 230 Mitral peak gradient, D 7 mm Hg --------- Mitral E/A ratio, peak 1.1 ---------  Pulmonary arteries Value Reference PA pressure, S, DP (H) 48 mm Hg <=30  Tricuspid valve Value Reference Tricuspid regurg peak velocity 335 cm/s --------- Tricuspid peak RV-RA gradient 45 mm Hg ---------  Systemic veins Value Reference Estimated CVP 3 mm Hg ---------  Right ventricle Value Reference TAPSE 24.4 mm --------- RV pressure, S, DP (H) 48 mm Hg <=30 RV s&', lateral, S 10.1 cm/s ---------  Legend: (L) and (H) mark values outside specified reference  range.  ------------------------------------------------------------------- Prepared and Electronically Authenticated by  Ena Dawley, M.D. 2017-11-30T14:11:49  Physicians   Panel Physicians Referring Physician Case Authorizing Physician  Sherren Mocha, MD (Primary)    Procedures   Right/Left Heart Cath and Coronary Angiography  Conclusion   1. Patent coronary arteries with minimal nonobstructive CAD 2. Severe calcific aortic stenosis with mean gradient 40 mmHg and AVA calculated 0.97 square cm 3. Severe pulmonary HTN likely secondary to left heart disease (PVR 2 Woods Units)  Plan: Continued evaluation for TAVR.   Indications   Severe aortic stenosis [I35.0 (ICD-10-CM)]  Procedural Details/Technique   Technical Details INDICATION: Severe symptomatic aortic stenosis (Stage D)  PROCEDURAL DETAILS: The right groin was prepped, draped, and anesthetized with 1% lidocaine. Using the modified Seldinger technique a 5 French sheath was placed in the right femoral artery and a 7 French sheath was placed in the right femoral vein. A Swan-Ganz catheter was used for the right heart catheterization. Standard protocol was followed for recording of right heart pressures and sampling of oxygen saturations. Fick cardiac output was calculated. Standard Judkins catheters were used for selective coronary angiography and left ventriculography. Aortic root angiography was performed. The aortic valve was crossed with an AL-1 catheter and a straight wire then changed out for a pigtail catheter. There were no immediate procedural complications. The patient was transferred to the post catheterization recovery area for further monitoring.     Estimated blood loss <50 mL.  During this procedure the patient was administered the following to achieve and maintain moderate conscious sedation: Versed 0.5 mg, Fentanyl 12.5 mcg, while the patient's heart rate, blood pressure, and oxygen saturation  were continuously monitored. The period of conscious sedation was 43 minutes, of which I was present face-to-face 100% of this time.    Coronary Findings   Dominance: Right  Left Anterior Descending  The vessel exhibits minimal luminal irregularities.  Ramus Intermedius  The vessel exhibits minimal luminal irregularities.  Left Circumflex  The vessel exhibits minimal luminal irregularities.  Right Coronary Artery  The vessel exhibits minimal luminal irregularities.  Right Heart   Right Heart Pressures Hemodynamic findings consistent with moderate pulmonary hypertension.    Wall Motion              Left Heart   Left Ventricle The left ventricular size is normal. The left ventricular systolic function is normal. LV end diastolic pressure is normal. The left ventricular ejection fraction is 50-55% by visual estimate. No  regional wall motion abnormalities. Low normal LV systolic function, LVEF [id number]    Aortic Valve There is severe aortic valve stenosis. There is no aortic valve regurgitation. The aortic valve is calcified. There is restricted aortic valve motion. The aortic valve is not congenitally bicuspid. Severe aortic stenosis with peak-to-peak gradient 46 mmHg, mean gradient 40 mmHg, and AVA 0.97 square cm    Coronary Diagrams   Diagnostic Diagram     Implants        No implant documentation for this case.  PACS Images   Show images for Cardiac catheterization   Link to Procedure Log   Procedure Log    Hemo Data   Flowsheet Row Most Recent Value  Fick Cardiac Output 6.59 L/min  Fick Cardiac Output Index 3.83 (L/min)/BSA  RA A Wave 11 mmHg  RA V Wave 10 mmHg  RA Mean 8 mmHg  RV Systolic Pressure 68 mmHg  RV Diastolic Pressure 3 mmHg  RV EDP 10 mmHg  PA Systolic Pressure 69 mmHg  PA Diastolic Pressure 22 mmHg  PA Mean 41 mmHg  PW A Wave 20 mmHg  PW V Wave 24 mmHg  PW Mean 17 mmHg  AO Systolic Pressure 123XX123 mmHg  AO Diastolic Pressure 70 mmHg   AO Mean 0000000 mmHg  LV Systolic Pressure 99991111 mmHg  LV Diastolic Pressure 12 mmHg  LV EDP 26 mmHg  Arterial Occlusion Pressure Extended Systolic Pressure [id number] mmHg  Arterial Occlusion Pressure Extended Diastolic Pressure 67 mmHg  Arterial Occlusion Pressure Extended Mean Pressure 103 mmHg  Left Ventricular Apex Extended Systolic Pressure 123456 mmHg  Left Ventricular Apex Extended Diastolic Pressure 20 mmHg  Left Ventricular Apex Extended EDP Pressure 30 mmHg  QP/QS 1  TPVR Index 10.7 HRUI  TSVR Index 28.44 HRUI  PVR SVR Ratio 0.16  TPVR/TSVR Ratio 0.38   ADDENDUM REPORT: 08/31/2016 17:45  CLINICAL DATA: 83 year old female with severe aortic stenosis.  EXAM: Cardiac TAVR CT  TECHNIQUE: The patient was scanned on a Philips 256 scanner. A 120 kV retrospective scan was triggered in the descending thoracic aorta at 111 HU's. Gantry rotation speed was 270 msecs and collimation was .9 mm. No beta blockade or nitro were given. The 3D data set was reconstructed in 5% intervals of the R-R cycle. Systolic and diastolic phases were analyzed on a dedicated work station using MPR, MIP and VRT modes. The patient received 80 cc of contrast.  FINDINGS: Aortic Valve: Severely thickened and calcified aortic valve with severely restricted valve opening and mild symmetric calcifications extending into the LVOT.  Aorta: Normal size with mild diffuse calcifications and no dissection.  Sinotubular Junction: 31 x 31 mm  Ascending Thoracic Aorta: 34 x 33 mm  Aortic Arch: 31 x 26 mm  Descending Thoracic Aorta: 21 x 21 mm  Sinus of Valsalva Measurements:  Non-coronary: 32 mm  Right -coronary: 31 mm  Left -coronary: 34 mm  Coronary Artery Height above Annulus:  Left Main: 11 mm  Right Coronary: 14 mm  Virtual Basal Annulus Measurements:  Maximum/Minimum Diameter: 28 x 22 mm  Perimeter: 88 mm  Area: 458 mm2  Optimum Fluoroscopic Angle for Delivery:  LAO 13 CAU 13  IMPRESSION: 1. Severely thickened and calcified aortic valve with severely restricted valve opening and mild symmetric calcifications extending into the LVOT. Annular measurements suitable for delivery of a 26 mm Edwards-SAPIEN TAVR valve.  2. Sufficient annulus to coronary distance.  3. Optimum Fluoroscopic Angle for Delivery: LAO 13 CAU [place]  Electronically Signed By: Ena Dawley On: 08/31/2016 17:45   CLINICAL DATA:  83 year old female with history of severe aortic stenosis. Preprocedural study prior to potential transcatheter aortic valve replacement (TAVR) procedure. History of end-stage renal disease on hemodialysis.  EXAM: CT ANGIOGRAPHY CHEST, ABDOMEN AND PELVIS  TECHNIQUE: Multidetector CT imaging through the chest, abdomen and pelvis was performed using the standard protocol during bolus administration of intravenous contrast. Multiplanar reconstructed images and MIPs were obtained and reviewed to evaluate the vascular anatomy.  CONTRAST:  75 mL of Isovue 370.  COMPARISON:  No priors.  FINDINGS: CTA CHEST FINDINGS  Cardiovascular: Heart size is mildly enlarged with some concentric left ventricular hypertrophy. Severe thickening calcification of the aortic valve. There is no significant pericardial fluid, thickening or pericardial calcification. Atherosclerosis of the thoracic aorta and the great vessels of the mediastinum.  Mediastinum/Lymph Nodes: Numerous borderline enlarged and mildly enlarged mediastinal and hilar lymph nodes are noted on today's examination, measuring up to 12 mm in short axis in the low right paratracheal and right hilar nodal stations. Esophagus is unremarkable in appearance. No axillary lymphadenopathy.  Lungs/Pleura: Scattered areas of linear scarring are noted in the lungs bilaterally, most evident in the dependent portions of the left lower lobe. No confluent  consolidative airspace disease. No pleural effusions. 9 x 5 mm (mean diameter of 7 mm) subpleural nodule in the posterior aspect of the right lower lobe (image 32 of series 407). No other larger suspicious appearing pulmonary nodules or masses are noted.  Musculoskeletal/Soft Tissues: There are no aggressive appearing lytic or blastic lesions noted in the visualized portions of the skeleton.  CTA ABDOMEN AND PELVIS FINDINGS  Hepatobiliary: The liver has a slightly shrunken appearance and nodular contour, suggesting underlying cirrhosis. No discrete cystic or solid hepatic lesions are noted. No intra or extrahepatic biliary ductal dilatation. Several densely calcified gallstones are noted lying dependently in the gallbladder neck.  Pancreas: In the head and proximal body of the pancreas there are 2 well-defined low-attenuation areas measuring up to 12 x 25 mm (image 160 of series 401), which are incompletely characterized, but favored to represent pancreatic pseudocysts. Throughout the distal body and tail of the pancreas there are innumerable cystic appearing areas, which is favored to reflect extreme side branch ectasia. Atrophy of the pancreatic parenchyma throughout the distal body and tail of the pancreas. Notably, the intervening pancreatic duct is normal in caliber in the proximal body and head of the pancreas. No peripancreatic inflammatory changes.  Spleen: Unremarkable.  Adrenals/Urinary Tract: Mild bilateral adrenal gland thickening. Mild parenchymal atrophy in both kidneys. Nonobstructive calculi in the collecting systems of the kidneys bilaterally, measuring up to 5 mm in the interpolar collecting system of the right kidney. Subcentimeter low-attenuation lesions in the kidneys bilaterally, too small to characterize, but statistically likely tiny cysts. No hydroureteronephrosis. Urinary bladder is nearly completely decompressed, with a small amount of gas non  dependently. No indwelling Foley catheter.  Stomach/Bowel: The appearance of the stomach is normal. There is no pathologic dilatation of small bowel or colon. Numerous colonic diverticulae are noted throughout the colon, without surrounding inflammatory changes to suggest an acute diverticulitis at this time. The appendix is not confidently identified and may be surgically absent. Regardless, there are no inflammatory changes noted adjacent to the cecum to suggest the presence of an acute appendicitis at this time.  Vascular/Lymphatic: Aortic atherosclerosis, with vascular findings and measurements pertinent to potential TAVR procedure, as detailed below. No aneurysm or dissection is identified in  the abdominal or pelvic vasculature. Separate origin of the common hepatic artery and the splenic artery/left gastric artery (normal anatomical variant) incidentally noted. All mesenteric branches appear widely patent, without hemodynamically significant stenosis. Single renal arteries bilaterally with prominent atherosclerotic plaques at their origins, which appear nonobstructive. No lymphadenopathy noted in the abdomen or pelvis.  Reproductive: Status post hysterectomy.  Ovaries are atrophic.  Other: No significant volume of ascites.  No pneumoperitoneum.  Musculoskeletal: There are no aggressive appearing lytic or blastic lesions noted in the visualized portions of the skeleton.  VASCULAR MEASUREMENTS PERTINENT TO TAVR:  AORTA:  Minimal Aortic Diameter -  14 x 12 mm  Severity of Aortic Calcification -  mild  RIGHT PELVIS:  Right Common Iliac Artery -  Minimal Diameter - 10.9 x 10.3 mm  Tortuosity - mild  Calcification - mild  Right External Iliac Artery -  Minimal Diameter - 8.1 x 7.7 mm  Tortuosity - severe  Calcification - none  Right Common Femoral Artery -  Minimal Diameter - 7.1 x 8.5 mm  Tortuosity - mild  Calcification -  mild  LEFT PELVIS:  Left Common Iliac Artery -  Minimal Diameter - 9.6 x 10.0 mm  Tortuosity - mild  Calcification - mild  Left External Iliac Artery -  Minimal Diameter - 8.0 x 6.8 mm  Tortuosity - moderate  Calcification - none  Left Common Femoral Artery -  Minimal Diameter - 7.3 x 6.7 mm  Tortuosity - mild  Calcification - mild  Review of the MIP images confirms the above findings.  IMPRESSION: 1. Vascular findings and measurements pertinent to potential TAVR procedure, as detailed above. This patient appears to have suitable pelvic arterial access bilaterally. 2. Severe thickening and calcification of the aortic valve, compatible with the reported clinical history of severe aortic stenosis. 3. Highly unusual appearance of the pancreas. 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 Repeat evaluation with MRI of the abdomen with and without IV gadolinium is recommended in 3-6 months to ensure the stability of these findings. 4. Several borderline enlarged and mildly enlarged right hilar and mediastinal lymph nodes, as above are nonspecific. In addition, there is a 7 mm subpleural nodule in the posterior aspect of the right lower lobe (image 32 of series 407). Non-contrast chest CT at 6-12 months is recommended. If the nodule is stable at time of repeat CT, then future CT at 18-24 months (from today's scan) is considered optional for low-risk patients, but is recommended for high-risk patients. This recommendation follows the consensus statement: Guidelines for Management of Incidental Pulmonary Nodules Detected on CT Images: From the Fleischner Society 2017; Radiology 2017; 284:228-243. 5. Small nonobstructive  calculi in the collecting systems of the kidneys bilaterally measuring up to 5 mm in the interpolar collecting system of the right kidney. No ureteral stones or findings of urinary tract obstruction are noted at this time. 6. Small amount of gas non dependently in the lumen of the urinary bladder. This is nonspecific, but is typically seen in the setting of recent catheterization. Clinical correlation is recommended. If there has been no recent history of catheterization, correlation with urinalysis would be recommended to exclude the possibility of infection with gas-forming organisms. 7. Severe colonic diverticulosis without evidence of acute diverticulitis at this time.  8. Cardiomegaly with concentric left ventricular hypertrophy. 9. Additional incidental findings, as above.   Electronically Signed   By: Vinnie Langton M.D.   On: 09/01/2016 12:57   RISK SCORES About the STS Risk Calculator Procedure: AV Replacement  Risk of Mortality: 14.283%  Morbidity or Mortality: 55.181%  Long Length of Stay: 41.817%  Short Length of Stay: 3.353%  Permanent Stroke: 4.513%  Prolonged Ventilation: 48.927%  DSW Infection: 0.901%  Renal Failure: N/A  Reoperation: 19.158%    Impression:  This 83 year old woman has stage D severe symptomatic aortic stenosis with NYHA class III symptoms of exertional shortness of breath and hypotension during dialysis with two syncopal episodes about one year ago. I have personally reviewed her 2D echo, cath and CTA scans. Her echo shows a trileaflet aortic valve with severely thickened and calcified leaflets that have restricted mobility with a mean AV gradient of 46 mm Hg and normal LV systolic function. There is no mitral stenosis and only mild MR. Her cath shows non-obstructive disease with a mean AV gradient of 40 mm Hg and severe pulmonary hypertension that appears to be due to left heart disease. I think that AVR is indicated in this patient with  the hope of improving her stamina and mobility and making her dialysis treatments smoother. The patient and her two sons were counseled at length regarding treatment alternatives for management of severe symptomatic aortic stenosis. Alternative approaches such as conventional aortic valve replacement, transcatheter aortic valve replacement, and palliative medical therapy were compared and contrasted at length. The risks associated with conventional surgical aortic valve replacement were been discussed in detail, as were expectations for post-operative convalescence. Long-term prognosis with medical therapy was discussed.  She would be at very high risk for open surgical AVR due to her age, deconditioning and reduced mobility, and comorbid factors of diabetes and dialysis-dependent renal failure. I think TAVR would be a reasonable alternative for her. Her gated cardiac CT showed anatomy favorable for a 26 mm Sapien 3 valve. There is mild calcification extending into the LVOT but I don't think that will cause any significant problem. Her abdominal and pelvic CT has been done and interpreted by me since the official radiology interpretation is not done yet. There is adequate pelvic arterial anatomy for transfemoral access.   She underwent dental evaluation and extraction of one tooth by Dr. Enrique Sack on 09/26/2015.  I discussed transcatheter aortic valve replacement, what types of management strategies would be attempted intraoperatively in the event of life-threatening complications, including whether or not the patient would be considered a candidate for the use of cardiopulmonary bypass and/or conversion to open sternotomy for attempted surgical intervention.  The patient has been advised of a variety of complications that might develop including but not limited to risks of death, stroke, paravalvular leak, aortic dissection or other major vascular complications, aortic annulus rupture, device embolization,  cardiac rupture or perforation, mitral regurgitation, acute myocardial infarction, arrhythmia, heart block or bradycardia requiring permanent pacemaker placement, congestive heart failure, respiratory failure, renal failure, pneumonia, infection, other late complications related to structural valve deterioration or migration, or other complications that might ultimately cause a temporary or permanent loss of functional independence or other long term morbidity.  The patient provides full informed consent for the procedure as described and all questions were answered.   Plan:  Transfemoral TAVR on Tuesday 09/29/2016.   Gaye Pollack, MD

## 2016-09-29 ENCOUNTER — Inpatient Hospital Stay (HOSPITAL_COMMUNITY): Payer: Medicare Other | Admitting: Anesthesiology

## 2016-09-29 ENCOUNTER — Encounter (HOSPITAL_COMMUNITY): Payer: Self-pay | Admitting: *Deleted

## 2016-09-29 ENCOUNTER — Encounter (HOSPITAL_COMMUNITY)
Admission: RE | Disposition: A | Discharge status: Home / Self Care | Payer: Self-pay | Source: Ambulatory Visit | Attending: Cardiovascular Disease

## 2016-09-29 ENCOUNTER — Other Ambulatory Visit: Payer: Self-pay

## 2016-09-29 ENCOUNTER — Inpatient Hospital Stay (HOSPITAL_COMMUNITY)
Admission: RE | Admit: 2016-09-29 | Discharge: 2016-10-04 | DRG: 266 | Disposition: A | Discharge status: Home / Self Care | Payer: Medicare Other | Source: Ambulatory Visit | Attending: Cardiovascular Disease | Admitting: Cardiovascular Disease

## 2016-09-29 ENCOUNTER — Ambulatory Visit (HOSPITAL_COMMUNITY)
Admission: RE | Admit: 2016-09-29 | Discharge: 2016-09-29 | Disposition: A | Discharge status: Home / Self Care | Payer: Medicare Other | Source: Ambulatory Visit | Attending: Cardiovascular Disease | Admitting: Cardiovascular Disease

## 2016-09-29 ENCOUNTER — Inpatient Hospital Stay (HOSPITAL_COMMUNITY): Payer: Medicare Other

## 2016-09-29 DIAGNOSIS — Z794 Long term (current) use of insulin: Secondary | ICD-10-CM

## 2016-09-29 DIAGNOSIS — N2581 Secondary hyperparathyroidism of renal origin: Secondary | ICD-10-CM | POA: Diagnosis not present

## 2016-09-29 DIAGNOSIS — Z79899 Other long term (current) drug therapy: Secondary | ICD-10-CM

## 2016-09-29 DIAGNOSIS — I350 Nonrheumatic aortic (valve) stenosis: Secondary | ICD-10-CM

## 2016-09-29 DIAGNOSIS — Z880 Allergy status to penicillin: Secondary | ICD-10-CM

## 2016-09-29 DIAGNOSIS — Z952 Presence of prosthetic heart valve: Secondary | ICD-10-CM

## 2016-09-29 DIAGNOSIS — I272 Pulmonary hypertension, unspecified: Secondary | ICD-10-CM | POA: Diagnosis present

## 2016-09-29 DIAGNOSIS — Z992 Dependence on renal dialysis: Secondary | ICD-10-CM

## 2016-09-29 DIAGNOSIS — E876 Hypokalemia: Secondary | ICD-10-CM | POA: Diagnosis not present

## 2016-09-29 DIAGNOSIS — N186 End stage renal disease: Secondary | ICD-10-CM | POA: Diagnosis present

## 2016-09-29 DIAGNOSIS — I5084 End stage heart failure: Secondary | ICD-10-CM | POA: Diagnosis present

## 2016-09-29 DIAGNOSIS — E1122 Type 2 diabetes mellitus with diabetic chronic kidney disease: Secondary | ICD-10-CM | POA: Diagnosis not present

## 2016-09-29 DIAGNOSIS — Z006 Encounter for examination for normal comparison and control in clinical research program: Secondary | ICD-10-CM

## 2016-09-29 DIAGNOSIS — R4182 Altered mental status, unspecified: Secondary | ICD-10-CM

## 2016-09-29 DIAGNOSIS — R400 Somnolence: Secondary | ICD-10-CM | POA: Diagnosis not present

## 2016-09-29 DIAGNOSIS — I4892 Unspecified atrial flutter: Secondary | ICD-10-CM | POA: Diagnosis not present

## 2016-09-29 DIAGNOSIS — R404 Transient alteration of awareness: Secondary | ICD-10-CM | POA: Diagnosis not present

## 2016-09-29 DIAGNOSIS — E11649 Type 2 diabetes mellitus with hypoglycemia without coma: Secondary | ICD-10-CM | POA: Diagnosis not present

## 2016-09-29 DIAGNOSIS — E119 Type 2 diabetes mellitus without complications: Secondary | ICD-10-CM

## 2016-09-29 DIAGNOSIS — I5033 Acute on chronic diastolic (congestive) heart failure: Secondary | ICD-10-CM | POA: Diagnosis not present

## 2016-09-29 DIAGNOSIS — I2510 Atherosclerotic heart disease of native coronary artery without angina pectoris: Secondary | ICD-10-CM | POA: Diagnosis present

## 2016-09-29 DIAGNOSIS — K219 Gastro-esophageal reflux disease without esophagitis: Secondary | ICD-10-CM | POA: Diagnosis present

## 2016-09-29 DIAGNOSIS — I10 Essential (primary) hypertension: Secondary | ICD-10-CM | POA: Diagnosis not present

## 2016-09-29 DIAGNOSIS — I132 Hypertensive heart and chronic kidney disease with heart failure and with stage 5 chronic kidney disease, or end stage renal disease: Secondary | ICD-10-CM | POA: Diagnosis present

## 2016-09-29 DIAGNOSIS — R410 Disorientation, unspecified: Secondary | ICD-10-CM | POA: Diagnosis not present

## 2016-09-29 DIAGNOSIS — E162 Hypoglycemia, unspecified: Secondary | ICD-10-CM

## 2016-09-29 DIAGNOSIS — D631 Anemia in chronic kidney disease: Secondary | ICD-10-CM | POA: Diagnosis present

## 2016-09-29 DIAGNOSIS — I639 Cerebral infarction, unspecified: Secondary | ICD-10-CM

## 2016-09-29 DIAGNOSIS — Z954 Presence of other heart-valve replacement: Secondary | ICD-10-CM | POA: Diagnosis not present

## 2016-09-29 DIAGNOSIS — Z953 Presence of xenogenic heart valve: Secondary | ICD-10-CM | POA: Diagnosis not present

## 2016-09-29 DIAGNOSIS — I5032 Chronic diastolic (congestive) heart failure: Secondary | ICD-10-CM

## 2016-09-29 HISTORY — PX: TEE WITHOUT CARDIOVERSION: SHX5443

## 2016-09-29 HISTORY — PX: TRANSCATHETER AORTIC VALVE REPLACEMENT, TRANSFEMORAL: SHX6400

## 2016-09-29 LAB — POCT I-STAT, CHEM 8
BUN: 15 mg/dL (ref 6–20)
BUN: 15 mg/dL (ref 6–20)
BUN: 15 mg/dL (ref 6–20)
CALCIUM ION: 1.12 mmol/L — AB (ref 1.15–1.40)
CALCIUM ION: 1.06 mmol/L — AB (ref 1.15–1.40)
CHLORIDE: 98 mmol/L — AB (ref 101–111)
CHLORIDE: 98 mmol/L — AB (ref 101–111)
CREATININE: 5 mg/dL — AB (ref 0.44–1.00)
CREATININE: 5.1 mg/dL — AB (ref 0.44–1.00)
Calcium, Ion: 1.16 mmol/L (ref 1.15–1.40)
Chloride: 97 mmol/L — ABNORMAL LOW (ref 101–111)
Creatinine, Ser: 5.1 mg/dL — ABNORMAL HIGH (ref 0.44–1.00)
GLUCOSE: 132 mg/dL — AB (ref 65–99)
Glucose, Bld: 137 mg/dL — ABNORMAL HIGH (ref 65–99)
Glucose, Bld: 149 mg/dL — ABNORMAL HIGH (ref 65–99)
HCT: 29 % — ABNORMAL LOW (ref 36.0–46.0)
HEMATOCRIT: 27 % — AB (ref 36.0–46.0)
HEMATOCRIT: 28 % — AB (ref 36.0–46.0)
HEMOGLOBIN: 9.5 g/dL — AB (ref 12.0–15.0)
Hemoglobin: 9.9 g/dL — ABNORMAL LOW (ref 12.0–15.0)
Hemoglobin: 9.2 g/dL — ABNORMAL LOW (ref 12.0–15.0)
POTASSIUM: 4.2 mmol/L (ref 3.5–5.1)
POTASSIUM: 4 mmol/L (ref 3.5–5.1)
Potassium: 3.8 mmol/L (ref 3.5–5.1)
SODIUM: 132 mmol/L — AB (ref 135–145)
Sodium: 134 mmol/L — ABNORMAL LOW (ref 135–145)
Sodium: 135 mmol/L (ref 135–145)
TCO2: 28 mmol/L (ref 0–100)
TCO2: 26 mmol/L (ref 0–100)
TCO2: 27 mmol/L (ref 0–100)

## 2016-09-29 LAB — POCT I-STAT 3, ART BLOOD GAS (G3+)
Acid-Base Excess: 3 mmol/L — ABNORMAL HIGH (ref 0.0–2.0)
Bicarbonate: 27.6 mmol/L (ref 20.0–28.0)
O2 SAT: 100 %
PCO2 ART: 41.3 mmHg (ref 32.0–48.0)
PH ART: 7.43 (ref 7.350–7.450)
PO2 ART: 178 mmHg — AB (ref 83.0–108.0)
Patient temperature: 36.2
TCO2: 29 mmol/L (ref 0–100)

## 2016-09-29 LAB — GLUCOSE, CAPILLARY
GLUCOSE-CAPILLARY: 164 mg/dL — AB (ref 65–99)
Glucose-Capillary: 151 mg/dL — ABNORMAL HIGH (ref 65–99)
Glucose-Capillary: 124 mg/dL — ABNORMAL HIGH (ref 65–99)
Glucose-Capillary: 161 mg/dL — ABNORMAL HIGH (ref 65–99)

## 2016-09-29 LAB — CBC
HEMATOCRIT: 29.7 % — AB (ref 36.0–46.0)
HEMOGLOBIN: 9.9 g/dL — AB (ref 12.0–15.0)
MCH: 30.5 pg (ref 26.0–34.0)
MCHC: 33.3 g/dL (ref 30.0–36.0)
MCV: 91.4 fL (ref 78.0–100.0)
Platelets: 132 10*3/uL — ABNORMAL LOW (ref 150–400)
RBC: 3.25 MIL/uL — ABNORMAL LOW (ref 3.87–5.11)
RDW: 13.5 % (ref 11.5–15.5)
WBC: 7.2 10*3/uL (ref 4.0–10.5)

## 2016-09-29 LAB — POCT I-STAT 4, (NA,K, GLUC, HGB,HCT)
Glucose, Bld: 143 mg/dL — ABNORMAL HIGH (ref 65–99)
HEMATOCRIT: 28 % — AB (ref 36.0–46.0)
HEMOGLOBIN: 9.5 g/dL — AB (ref 12.0–15.0)
POTASSIUM: 3.8 mmol/L (ref 3.5–5.1)
SODIUM: 136 mmol/L (ref 135–145)

## 2016-09-29 LAB — SURGICAL PCR SCREEN
MRSA, PCR: NEGATIVE
STAPHYLOCOCCUS AUREUS: NEGATIVE

## 2016-09-29 LAB — PROTIME-INR
INR: 1.21
Prothrombin Time: 15.4 seconds — ABNORMAL HIGH (ref 11.4–15.2)

## 2016-09-29 LAB — APTT: aPTT: 35 seconds (ref 24–36)

## 2016-09-29 SURGERY — IMPLANTATION, AORTIC VALVE, TRANSCATHETER, FEMORAL APPROACH
Anesthesia: General | Site: Chest

## 2016-09-29 MED ORDER — SUCCINYLCHOLINE CHLORIDE 200 MG/10ML IV SOSY
PREFILLED_SYRINGE | INTRAVENOUS | Status: AC
  Filled 2016-09-29: qty 10

## 2016-09-29 MED ORDER — SUGAMMADEX SODIUM 200 MG/2ML IV SOLN
INTRAVENOUS | Status: DC | PRN
  Administered 2016-09-29: 150 mg via INTRAVENOUS

## 2016-09-29 MED ORDER — LIDOCAINE HCL (CARDIAC) 20 MG/ML IV SOLN
INTRAVENOUS | Status: DC | PRN
  Administered 2016-09-29: 60 mg via INTRAVENOUS

## 2016-09-29 MED ORDER — LEVOFLOXACIN IN D5W 750 MG/150ML IV SOLN
750.0000 mg | INTRAVENOUS | Status: DC
  Administered 2016-09-30: 750 mg via INTRAVENOUS
  Filled 2016-09-29: qty 150

## 2016-09-29 MED ORDER — FAMOTIDINE 20 MG PO TABS: 20.0000 mg | ORAL_TABLET | Freq: Two times a day (BID) | ORAL | Status: DC

## 2016-09-29 MED ORDER — ROCURONIUM BROMIDE 100 MG/10ML IV SOLN
INTRAVENOUS | Status: DC | PRN
  Administered 2016-09-29: 40 mg via INTRAVENOUS

## 2016-09-29 MED ORDER — CINACALCET HCL 30 MG PO TABS
30.0000 mg | ORAL_TABLET | ORAL | Status: DC
  Administered 2016-10-01 – 2016-10-03 (×2): 30 mg via ORAL
  Filled 2016-09-29 (×3): qty 1

## 2016-09-29 MED ORDER — CHLORHEXIDINE GLUCONATE 4 % EX LIQD: 60.0000 mL | Freq: Once | CUTANEOUS | Status: DC

## 2016-09-29 MED ORDER — NOREPINEPHRINE BITARTRATE 1 MG/ML IV SOLN
INTRAVENOUS | Status: DC | PRN
  Administered 2016-09-29: 2 ug/min via INTRAVENOUS

## 2016-09-29 MED ORDER — METOPROLOL TARTRATE 5 MG/5ML IV SOLN
2.5000 mg | INTRAVENOUS | Status: DC | PRN
  Administered 2016-09-29 (×2): 2.5 mg via INTRAVENOUS
  Administered 2016-09-29 – 2016-10-02 (×5): 5 mg via INTRAVENOUS
  Filled 2016-09-29 (×6): qty 5

## 2016-09-29 MED ORDER — HEPARIN SODIUM (PORCINE) 1000 UNIT/ML IJ SOLN
INTRAMUSCULAR | Status: DC | PRN
  Administered 2016-09-29: 7000 [IU] via INTRAVENOUS

## 2016-09-29 MED ORDER — NITROGLYCERIN IN D5W 200-5 MCG/ML-% IV SOLN
0.0000 ug/min | INTRAVENOUS | Status: DC
  Administered 2016-09-29: 5 ug/min via INTRAVENOUS
  Administered 2016-09-30 (×2): 100 ug/min via INTRAVENOUS
  Administered 2016-10-01: 50 ug/min via INTRAVENOUS
  Filled 2016-09-29 (×3): qty 250

## 2016-09-29 MED ORDER — SODIUM CHLORIDE 0.9% FLUSH
3.0000 mL | INTRAVENOUS | Status: DC | PRN
Start: 2016-09-29 — End: 2016-09-30

## 2016-09-29 MED ORDER — PROPOFOL 10 MG/ML IV BOLUS
INTRAVENOUS | Status: DC | PRN
  Administered 2016-09-29: 70 mg via INTRAVENOUS

## 2016-09-29 MED ORDER — SODIUM CHLORIDE 0.9 % IV SOLN: 1.0000 mL/kg/h | INTRAVENOUS | Status: DC

## 2016-09-29 MED ORDER — INSULIN ASPART 100 UNIT/ML ~~LOC~~ SOLN
0.0000 [IU] | Freq: Three times a day (TID) | SUBCUTANEOUS | Status: DC
  Administered 2016-09-30: 2 [IU] via SUBCUTANEOUS
  Administered 2016-09-30: 1 [IU] via SUBCUTANEOUS
  Administered 2016-09-30: 2 [IU] via SUBCUTANEOUS
  Administered 2016-10-01: 1 [IU] via SUBCUTANEOUS
  Administered 2016-10-02: 2 [IU] via SUBCUTANEOUS
  Administered 2016-10-03: 7 [IU] via SUBCUTANEOUS

## 2016-09-29 MED ORDER — METOPROLOL TARTRATE 12.5 MG HALF TABLET: 12.5000 mg | ORAL_TABLET | Freq: Two times a day (BID) | ORAL | Status: DC

## 2016-09-29 MED ORDER — VANCOMYCIN HCL IN DEXTROSE 1-5 GM/200ML-% IV SOLN: 1000.0000 mg | Freq: Once | INTRAVENOUS | Status: DC

## 2016-09-29 MED ORDER — SODIUM CHLORIDE 0.9 % IV SOLN: 250.0000 mL | INTRAVENOUS | Status: DC | PRN

## 2016-09-29 MED ORDER — LACTATED RINGERS IV SOLN: Freq: Once | INTRAVENOUS | Status: DC

## 2016-09-29 MED ORDER — SUCCINYLCHOLINE CHLORIDE 20 MG/ML IJ SOLN
INTRAMUSCULAR | Status: DC | PRN
  Administered 2016-09-29: 60 mg via INTRAVENOUS

## 2016-09-29 MED ORDER — IODIXANOL 320 MG/ML IV SOLN
INTRAVENOUS | Status: DC | PRN
  Administered 2016-09-29: 75 mL via INTRAVENOUS

## 2016-09-29 MED ORDER — NITROGLYCERIN IN D5W 200-5 MCG/ML-% IV SOLN
INTRAVENOUS | Status: AC
  Filled 2016-09-29: qty 250

## 2016-09-29 MED ORDER — ONDANSETRON HCL 4 MG/2ML IJ SOLN
INTRAMUSCULAR | Status: DC | PRN
  Administered 2016-09-29: 4 mg via INTRAVENOUS

## 2016-09-29 MED ORDER — CHLORHEXIDINE GLUCONATE 4 % EX LIQD: 30.0000 mL | CUTANEOUS | Status: DC

## 2016-09-29 MED ORDER — LIDOCAINE 2% (20 MG/ML) 5 ML SYRINGE
INTRAMUSCULAR | Status: AC
  Filled 2016-09-29: qty 5

## 2016-09-29 MED ORDER — HEPARIN SODIUM (PORCINE) 1000 UNIT/ML IJ SOLN
INTRAMUSCULAR | Status: AC
  Filled 2016-09-29: qty 1

## 2016-09-29 MED ORDER — 0.9 % SODIUM CHLORIDE (POUR BTL) OPTIME
TOPICAL | Status: DC | PRN
  Administered 2016-09-29: 1000 mL

## 2016-09-29 MED ORDER — ONDANSETRON HCL 4 MG/2ML IJ SOLN
INTRAMUSCULAR | Status: AC
Start: 2016-09-29 — End: 2016-09-29
  Filled 2016-09-29: qty 2

## 2016-09-29 MED ORDER — CALCIUM CHLORIDE 10 % IV SOLN
INTRAVENOUS | Status: AC
  Filled 2016-09-29: qty 10

## 2016-09-29 MED ORDER — PROTAMINE SULFATE 10 MG/ML IV SOLN
INTRAVENOUS | Status: DC | PRN
  Administered 2016-09-29: 70 mg via INTRAVENOUS

## 2016-09-29 MED ORDER — ONDANSETRON HCL 4 MG/2ML IJ SOLN: 4.0000 mg | Freq: Four times a day (QID) | INTRAMUSCULAR | Status: DC | PRN

## 2016-09-29 MED ORDER — SODIUM CHLORIDE 0.9% FLUSH
3.0000 mL | Freq: Two times a day (BID) | INTRAVENOUS | Status: DC
  Administered 2016-09-29 – 2016-09-30 (×3): 3 mL via INTRAVENOUS
  Administered 2016-10-02: 10 mL via INTRAVENOUS
  Administered 2016-10-02 (×2): 3 mL via INTRAVENOUS

## 2016-09-29 MED ORDER — TRAMADOL HCL 50 MG PO TABS: 50.0000 mg | ORAL_TABLET | ORAL | Status: DC | PRN

## 2016-09-29 MED ORDER — INSULIN DETEMIR 100 UNIT/ML ~~LOC~~ SOLN
14.0000 [IU] | Freq: Two times a day (BID) | SUBCUTANEOUS | Status: DC
  Administered 2016-09-29 – 2016-10-01 (×5): 14 [IU] via SUBCUTANEOUS
  Filled 2016-09-29 (×6): qty 0.14

## 2016-09-29 MED ORDER — ALBUMIN HUMAN 5 % IV SOLN: 250.0000 mL | INTRAVENOUS | Status: AC | PRN

## 2016-09-29 MED ORDER — PANTOPRAZOLE SODIUM 40 MG PO TBEC
40.0000 mg | DELAYED_RELEASE_TABLET | Freq: Every day | ORAL | Status: DC
  Administered 2016-10-01 – 2016-10-04 (×4): 40 mg via ORAL
  Filled 2016-09-29 (×4): qty 1

## 2016-09-29 MED ORDER — HYDROCODONE-ACETAMINOPHEN 7.5-325 MG/15ML PO SOLN: 10.0000 mL | Freq: Four times a day (QID) | ORAL | Status: DC | PRN

## 2016-09-29 MED ORDER — LACTATED RINGERS IV SOLN: 500.0000 mL | Freq: Once | INTRAVENOUS | Status: DC | PRN

## 2016-09-29 MED ORDER — CALCIUM ACETATE (PHOS BINDER) 667 MG PO CAPS
667.0000 mg | ORAL_CAPSULE | Freq: Every day | ORAL | Status: DC
  Administered 2016-09-30 – 2016-10-04 (×4): 667 mg via ORAL
  Filled 2016-09-29 (×4): qty 1

## 2016-09-29 MED ORDER — REMIFENTANIL HCL 1 MG IV SOLR
INTRAVENOUS | Status: DC | PRN
  Administered 2016-09-29: .5 ug/kg/min via INTRAVENOUS

## 2016-09-29 MED ORDER — ESMOLOL HCL 100 MG/10ML IV SOLN
INTRAVENOUS | Status: AC
  Filled 2016-09-29: qty 10

## 2016-09-29 MED ORDER — METOPROLOL TARTRATE 25 MG/10 ML ORAL SUSPENSION: 12.5000 mg | Freq: Two times a day (BID) | ORAL | Status: DC

## 2016-09-29 MED ORDER — HEPARIN SODIUM (PORCINE) 5000 UNIT/ML IJ SOLN
INTRAMUSCULAR | Status: DC | PRN
  Administered 2016-09-29: 500 mL

## 2016-09-29 MED ORDER — FENTANYL CITRATE (PF) 100 MCG/2ML IJ SOLN
25.0000 ug | INTRAMUSCULAR | Status: DC | PRN
  Administered 2016-09-29: 50 ug via INTRAVENOUS
  Filled 2016-09-29: qty 2

## 2016-09-29 MED ORDER — FAMOTIDINE IN NACL 20-0.9 MG/50ML-% IV SOLN: 20.0000 mg | Freq: Two times a day (BID) | INTRAVENOUS | Status: AC

## 2016-09-29 MED ORDER — PROPOFOL 10 MG/ML IV BOLUS
INTRAVENOUS | Status: AC
Start: 2016-09-29 — End: 2016-09-29
  Filled 2016-09-29: qty 20

## 2016-09-29 MED ORDER — LABETALOL HCL 5 MG/ML IV SOLN
INTRAVENOUS | Status: DC | PRN
  Administered 2016-09-29 (×2): 10 mg via INTRAVENOUS

## 2016-09-29 MED ORDER — ASPIRIN EC 81 MG PO TBEC
81.0000 mg | DELAYED_RELEASE_TABLET | Freq: Every day | ORAL | Status: DC
  Administered 2016-10-01 – 2016-10-04 (×3): 81 mg via ORAL
  Filled 2016-09-29 (×5): qty 1

## 2016-09-29 MED ORDER — ONDANSETRON HCL 4 MG/2ML IJ SOLN
INTRAMUSCULAR | Status: AC
  Filled 2016-09-29: qty 2

## 2016-09-29 MED ORDER — ATORVASTATIN CALCIUM 20 MG PO TABS
20.0000 mg | ORAL_TABLET | Freq: Every day | ORAL | Status: DC
  Administered 2016-09-29 – 2016-10-03 (×5): 20 mg via ORAL
  Filled 2016-09-29 (×5): qty 1

## 2016-09-29 MED ORDER — SUGAMMADEX SODIUM 200 MG/2ML IV SOLN
INTRAVENOUS | Status: AC
  Filled 2016-09-29: qty 2

## 2016-09-29 MED ORDER — CALCIUM CHLORIDE 10 % IV SOLN
INTRAVENOUS | Status: DC | PRN
  Administered 2016-09-29: 200 mg via INTRAVENOUS

## 2016-09-29 MED ORDER — PROTAMINE SULFATE 10 MG/ML IV SOLN
INTRAVENOUS | Status: AC
  Filled 2016-09-29: qty 25

## 2016-09-29 MED ORDER — LACTATED RINGERS IV SOLN
INTRAVENOUS | Status: DC | PRN
  Administered 2016-09-29: 11:00:00 via INTRAVENOUS

## 2016-09-29 MED ORDER — PHENYLEPHRINE HCL 10 MG/ML IJ SOLN
0.0000 ug/min | INTRAVENOUS | Status: DC
  Filled 2016-09-29: qty 2

## 2016-09-29 MED ORDER — CHLORHEXIDINE GLUCONATE 0.12 % MT SOLN
15.0000 mL | OROMUCOSAL | Status: AC
  Administered 2016-09-29: 15 mL via OROMUCOSAL

## 2016-09-29 MED FILL — Magnesium Sulfate Inj 50%: INTRAMUSCULAR | Qty: 10 | Status: AC

## 2016-09-29 MED FILL — Heparin Sodium (Porcine) Inj 1000 Unit/ML: INTRAMUSCULAR | Qty: 30 | Status: AC

## 2016-09-29 MED FILL — Potassium Chloride Inj 2 mEq/ML: INTRAVENOUS | Qty: 40 | Status: AC

## 2016-09-29 SURGICAL SUPPLY — 103 items
ADAPTER UNIV SWAN GANZ BIP (ADAPTER) ×2 IMPLANT
ADAPTER UNV SWAN GANZ BIP (ADAPTER) ×2
ATTRACTOMAT 16X20 MAGNETIC DRP (DRAPES) IMPLANT
BAG BANDED W/RUBBER/TAPE 36X54 (MISCELLANEOUS) ×4 IMPLANT
BAG DECANTER FOR FLEXI CONT (MISCELLANEOUS) IMPLANT
BAG SNAP BAND KOVER 36X36 (MISCELLANEOUS) ×8 IMPLANT
BLADE 10 SAFETY STRL DISP (BLADE) ×4 IMPLANT
BLADE STERNUM SYSTEM 6 (BLADE) ×4 IMPLANT
BLADE SURG ROTATE 9660 (MISCELLANEOUS) IMPLANT
CABLE PACING FASLOC BIEGE (MISCELLANEOUS) ×4 IMPLANT
CABLE PACING FASLOC BLUE (MISCELLANEOUS) ×4 IMPLANT
CANISTER SUCTION 2500CC (MISCELLANEOUS) IMPLANT
CANNULA FEM VENOUS REMOTE 22FR (CANNULA) IMPLANT
CANNULA OPTISITE PERFUSION 16F (CANNULA) IMPLANT
CANNULA OPTISITE PERFUSION 18F (CANNULA) IMPLANT
CATH DIAG EXPO 6F AL2 (CATHETERS) ×4 IMPLANT
CATH DIAG EXPO 6F VENT PIG 145 (CATHETERS) ×8 IMPLANT
CATH EXPO 5FR AL1 (CATHETERS) ×4 IMPLANT
CATH FELDMAN FA1 6F 100CM (CATHETERS) ×4 IMPLANT
CATH S G BIP PACING (SET/KITS/TRAYS/PACK) ×8 IMPLANT
CLIP TI MEDIUM 24 (CLIP) ×4 IMPLANT
CLIP TI WIDE RED SMALL 24 (CLIP) ×4 IMPLANT
CONT SPEC 4OZ CLIKSEAL STRL BL (MISCELLANEOUS) ×8 IMPLANT
COVER DOME SNAP 22 D (MISCELLANEOUS) ×4 IMPLANT
COVER MAYO STAND STRL (DRAPES) ×4 IMPLANT
COVER TABLE BACK 60X90 (DRAPES) ×4 IMPLANT
CRADLE DONUT ADULT HEAD (MISCELLANEOUS) ×4 IMPLANT
DERMABOND ADVANCED (GAUZE/BANDAGES/DRESSINGS) ×2
DERMABOND ADVANCED .7 DNX12 (GAUZE/BANDAGES/DRESSINGS) ×2 IMPLANT
DEVICE CLOSURE PERCLS PRGLD 6F (VASCULAR PRODUCTS) IMPLANT
DRAPE INCISE IOBAN 66X45 STRL (DRAPES) IMPLANT
DRAPE SLUSH MACHINE 52X66 (DRAPES) ×4 IMPLANT
DRAPE TABLE COVER HEAVY DUTY (DRAPES) ×4 IMPLANT
DRSG TEGADERM 2-3/8X2-3/4 SM (GAUZE/BANDAGES/DRESSINGS) ×4 IMPLANT
DRSG TEGADERM 4X4.75 (GAUZE/BANDAGES/DRESSINGS) ×4 IMPLANT
ELECT REM PT RETURN 9FT ADLT (ELECTROSURGICAL) ×8
ELECTRODE REM PT RTRN 9FT ADLT (ELECTROSURGICAL) ×4 IMPLANT
FELT TEFLON 6X6 (MISCELLANEOUS) ×4 IMPLANT
FEMORAL VENOUS CANN RAP (CANNULA) IMPLANT
GAUZE SPONGE 2X2 8PLY STRL LF (GAUZE/BANDAGES/DRESSINGS) ×4 IMPLANT
GAUZE SPONGE 4X4 12PLY STRL (GAUZE/BANDAGES/DRESSINGS) IMPLANT
GLOVE BIO SURGEON STRL SZ7.5 (GLOVE) ×4 IMPLANT
GLOVE BIO SURGEON STRL SZ8 (GLOVE) ×8 IMPLANT
GLOVE EUDERMIC 7 POWDERFREE (GLOVE) ×4 IMPLANT
GLOVE ORTHO TXT STRL SZ7.5 (GLOVE) ×4 IMPLANT
GOWN STRL REUS W/ TWL LRG LVL3 (GOWN DISPOSABLE) ×6 IMPLANT
GOWN STRL REUS W/ TWL XL LVL3 (GOWN DISPOSABLE) ×12 IMPLANT
GOWN STRL REUS W/TWL LRG LVL3 (GOWN DISPOSABLE) ×6
GOWN STRL REUS W/TWL XL LVL3 (GOWN DISPOSABLE) ×12
GUIDEWIRE SAF TJ AMPL .035X180 (WIRE) ×4 IMPLANT
GUIDEWIRE SAFE TJ AMPLATZ EXST (WIRE) ×4 IMPLANT
GUIDEWIRE STRAIGHT .035 260CM (WIRE) ×4 IMPLANT
INSERT FOGARTY 61MM (MISCELLANEOUS) ×4 IMPLANT
INSERT FOGARTY SM (MISCELLANEOUS) IMPLANT
INSERT FOGARTY XLG (MISCELLANEOUS) IMPLANT
KIT BASIN OR (CUSTOM PROCEDURE TRAY) ×4 IMPLANT
KIT DILATOR VASC 18G NDL (KITS) IMPLANT
KIT HEART LEFT (KITS) ×4 IMPLANT
KIT ROOM TURNOVER OR (KITS) ×4 IMPLANT
KIT SUCTION CATH 14FR (SUCTIONS) ×8 IMPLANT
NEEDLE PERC 18GX7CM (NEEDLE) ×4 IMPLANT
NS IRRIG 1000ML POUR BTL (IV SOLUTION) ×12 IMPLANT
PACK AORTA (CUSTOM PROCEDURE TRAY) ×4 IMPLANT
PAD ARMBOARD 7.5X6 YLW CONV (MISCELLANEOUS) ×8 IMPLANT
PAD ELECT DEFIB RADIOL ZOLL (MISCELLANEOUS) ×4 IMPLANT
PATCH TACHOSII LRG 9.5X4.8 (VASCULAR PRODUCTS) IMPLANT
PERCLOSE PROGLIDE 6F (VASCULAR PRODUCTS)
SET MICROPUNCTURE 5F STIFF (MISCELLANEOUS) ×4 IMPLANT
SHEATH AVANTI 11CM 8FR (MISCELLANEOUS) ×4 IMPLANT
SHEATH PINNACLE 6F 10CM (SHEATH) ×8 IMPLANT
SLEEVE REPOSITIONING LENGTH 30 (MISCELLANEOUS) ×4 IMPLANT
SPONGE GAUZE 2X2 STER 10/PKG (GAUZE/BANDAGES/DRESSINGS) ×4
SPONGE GAUZE 4X4 12PLY STER LF (GAUZE/BANDAGES/DRESSINGS) ×4 IMPLANT
SPONGE LAP 4X18 X RAY DECT (DISPOSABLE) ×4 IMPLANT
STOPCOCK MORSE 400PSI 3WAY (MISCELLANEOUS) ×24 IMPLANT
SUT ETHIBOND X763 2 0 SH 1 (SUTURE) IMPLANT
SUT GORETEX CV 4 TH 22 36 (SUTURE) IMPLANT
SUT GORETEX CV4 TH-18 (SUTURE) IMPLANT
SUT GORETEX TH-18 36 INCH (SUTURE) IMPLANT
SUT MNCRL AB 3-0 PS2 18 (SUTURE) IMPLANT
SUT PROLENE 3 0 SH1 36 (SUTURE) IMPLANT
SUT PROLENE 4 0 RB 1 (SUTURE)
SUT PROLENE 4-0 RB1 .5 CRCL 36 (SUTURE) IMPLANT
SUT PROLENE 5 0 C 1 36 (SUTURE) IMPLANT
SUT PROLENE 6 0 C 1 30 (SUTURE) IMPLANT
SUT SILK  1 MH (SUTURE) ×2
SUT SILK 1 MH (SUTURE) ×2 IMPLANT
SUT SILK 2 0 SH CR/8 (SUTURE) IMPLANT
SUT VIC AB 2-0 CT1 27 (SUTURE)
SUT VIC AB 2-0 CT1 TAPERPNT 27 (SUTURE) IMPLANT
SUT VIC AB 2-0 CTX 36 (SUTURE) IMPLANT
SUT VIC AB 3-0 SH 8-18 (SUTURE) IMPLANT
SYR 30ML LL (SYRINGE) ×8 IMPLANT
SYR 50ML LL SCALE MARK (SYRINGE) ×4 IMPLANT
SYRINGE 10CC LL (SYRINGE) ×12 IMPLANT
TOWEL OR 17X26 10 PK STRL BLUE (TOWEL DISPOSABLE) ×8 IMPLANT
TRANSDUCER W/STOPCOCK (MISCELLANEOUS) ×8 IMPLANT
TRAY FOLEY IC TEMP SENS 14FR (CATHETERS) ×4 IMPLANT
TUBE SUCT INTRACARD DLP 20F (MISCELLANEOUS) IMPLANT
TUBING HIGH PRESSURE 120CM (CONNECTOR) ×4 IMPLANT
VALVE HEART TRANSCATH SZ3 26MM (Prosthesis & Implant Heart) ×4 IMPLANT
WIRE AMPLATZ SS-J .035X180CM (WIRE) ×4 IMPLANT
WIRE BENTSON .035X145CM (WIRE) ×4 IMPLANT

## 2016-09-29 NOTE — Anesthesia Procedure Notes (Signed)
Procedure Name: Intubation Date/Time: 09/29/2016 10:55 AM Performed by: Rebekah Chesterfield L Pre-anesthesia Checklist: Patient identified, Emergency Drugs available, Suction available and Patient being monitored Patient Re-evaluated:Patient Re-evaluated prior to inductionOxygen Delivery Method: Circle System Utilized Preoxygenation: Pre-oxygenation with 100% oxygen Intubation Type: IV induction Ventilation: Mask ventilation without difficulty Laryngoscope Size: Miller and 2 Grade View: Grade II Tube type: Oral Tube size: 7.5 mm Number of attempts: 1 Airway Equipment and Method: Stylet and Oral airway Placement Confirmation: ETT inserted through vocal cords under direct vision,  positive ETCO2 and breath sounds checked- equal and bilateral Secured at: 20 cm Tube secured with: Tape Dental Injury: Teeth and Oropharynx as per pre-operative assessment

## 2016-09-29 NOTE — Anesthesia Preprocedure Evaluation (Signed)
Anesthesia Evaluation  Patient identified by MRN, date of birth, ID band Patient awake    Reviewed: Allergy & Precautions, NPO status , Patient's Chart, lab work & pertinent test results, reviewed documented beta blocker date and time   History of Anesthesia Complications Negative for: history of anesthetic complications  Airway Mallampati: II  TM Distance: >3 FB Neck ROM: Full    Dental  (+) Teeth Intact   Pulmonary shortness of breath,    breath sounds clear to auscultation       Cardiovascular hypertension, Pt. on medications and Pt. on home beta blockers + Valvular Problems/Murmurs AS  Rhythm:Regular + Systolic murmurs    Neuro/Psych negative neurological ROS  negative psych ROS   GI/Hepatic Neg liver ROS, GERD  ,  Endo/Other  diabetes, Insulin Dependent  Renal/GU ESRF and DialysisRenal disease     Musculoskeletal   Abdominal   Peds  Hematology  (+) anemia ,   Anesthesia Other Findings   Reproductive/Obstetrics                             Anesthesia Physical Anesthesia Plan  ASA: III  Anesthesia Plan: General   Post-op Pain Management:    Induction: Intravenous  Airway Management Planned: Oral ETT  Additional Equipment: Arterial line, CVP and Ultrasound Guidance Line Placement  Intra-op Plan:   Post-operative Plan: Extubation in OR  Informed Consent: I have reviewed the patients History and Physical, chart, labs and discussed the procedure including the risks, benefits and alternatives for the proposed anesthesia with the patient or authorized representative who has indicated his/her understanding and acceptance.   Dental advisory given  Plan Discussed with: CRNA and Surgeon  Anesthesia Plan Comments:         Anesthesia Quick Evaluation

## 2016-09-29 NOTE — Progress Notes (Signed)
PHARMACY NOTE -  ANTIBIOTIC RENAL DOSE ADJUSTMENT   Request received for Pharmacy to assist with antibiotic renal dose adjustment.  Patient has been initiated on vancomycin for surgical prophylaxis s/p TAVR.  ESRD on HD on MWF - SCr 5.1  Patient received vancomycin 1250mg  IV x 1 around 1035 today and does not need another dose 12 hours post the first dose.   Francyne Arreaga D. Mina Marble, PharmD, BCPS Pager:  [phone] 09/29/2016, 2:08 PM

## 2016-09-29 NOTE — Op Note (Signed)
[facility]   MULTIDISCIPLINARY HEART VALVE TEAM   TAVR OPERATIVE NOTE   Date of Procedure:  09/29/2016  Preoperative Diagnosis: Severe Aortic Stenosis   Postoperative Diagnosis: Same   Procedure:    Transcatheter Aortic Valve Replacement - Percutaneous Right Transfemoral Approach  Edwards Sapien 3 THV (size 26 mm, model # 9600TFX, serial # [id number])   Co-Surgeons:  Gaye Pollack, MD and Sherren Mocha, MD   Anesthesiologist:  Oleta Mouse, MD  Echocardiographer:  Ena Dawley, MD  Pre-operative Echo Findings:   severe aortic stenosis   Normal left ventricular systolic function   Post-operative Echo Findings:  Trivial paravalvular leak  Normal left ventricular systolic function   BRIEF CLINICAL NOTE AND INDICATIONS FOR SURGERY   The patient is an 83 year old woman with DM, hypertension, atrial flutter, and ESRD who has been on HD for the past three years. She developed pneumonia in February 2017 and was hospitalized at [facility] for several weeks before being transferred to [facility] her for about one month to recover. She has been back home since May 5th and her sons alternate staying with her. She has been fairly sedentary since but says that she can walk around her house and takes care of herself. She uses a wheelchair when out of the house. She had a 2D echo done on 07/03/2016 due to a heart murmur and this showed severe AS with a mean gradient of 51 mm Hg with an AVA of 0.4 cm2. The LVEF was 50-55% with mild to moderate MR, moderate TR and moderate to severe pulmonary hypertension. She was seen by Dr. Burt Knack and underwent cath on 08/19/2016 showing minimal non-obstructive CAD with a mean AV gradient of 40 mm Hg. There was severe pulmonary hypertension at 69/22 with a PVR of 2 Woods Units. She reports shortness of breath with low-level activity in the house. She has not had any chest pain or tightness, recent dizziness or syncope. Her  sons do report two episodes of syncope last year prior to her hospitalization in Feb. She has had some hypotension during dialysis that shortened the session. She has had some nausea towards the end of the dialysis session if she eats during the session.   She has stage D severe symptomatic aortic stenosis with NYHA class III symptoms of exertional shortness of breath and hypotension during dialysis with two syncopal episodes about one year ago. I have personally reviewed her 2D echo, cath and CTA scans. Her echo shows a trileaflet aortic valve with severely thickened and calcified leaflets that have restricted mobility with a mean AV gradient of 46 mm Hg and normal LV systolic function. There is no mitral stenosis and only mild MR. Her cath shows non-obstructive disease with a mean AV gradient of 40 mm Hg and severe pulmonary hypertension that appears to be due to left heart disease. I think that AVR is indicated in this patient with the hope of improving her stamina and mobility and making her dialysis treatments smoother. The patient and her two sonswere counseled at length regarding treatment alternatives for management of severe symptomatic aortic stenosis. Alternative approaches such as conventional aortic valve replacement, transcatheter aortic valve replacement, and palliative medical therapy were compared and contrasted at length. The risks associated with conventional surgical aortic valve replacement were been discussed in detail, as were expectations for post-operative convalescence. Long-term prognosis with medical therapy was discussed.  She would be at very high risk for open surgical AVR due to her  age, deconditioning and reduced mobility, and comorbid factors of diabetes and dialysis-dependent renal failure. I think TAVR would be a reasonable alternative for her. Her gated cardiac CT showed anatomy favorable for a 26 mm Sapien 3 valve. There is mild calcification extending into the LVOT but I  don't think that will cause any significant problem. Her abdominal and pelvic CT has been done and interpreted by me since the official radiology interpretation is not done yet. There is adequate pelvic arterial anatomy for transfemoral access.   She underwent dental evaluation and extraction of one tooth by Dr. Enrique Sack on 09/26/2015.  Idiscussed transcatheter aortic valve replacement, what types of management strategies would be attempted intraoperatively in the event of life-threatening complications, including whether or not the patient would be considered a candidate for the use of cardiopulmonary bypass and/or conversion to open sternotomy for attempted surgical intervention. The patient has been advised of a variety of complications that might develop including but not limited to risks of death, stroke, paravalvular leak, aortic dissection or other major vascular complications, aortic annulus rupture, device embolization, cardiac rupture or perforation, mitral regurgitation, acute myocardial infarction, arrhythmia, heart block or bradycardia requiring permanent pacemaker placement, congestive heart failure, respiratory failure, renal failure, pneumonia, infection, other late complications related to structural valve deterioration or migration, or other complications that might ultimately cause a temporary or permanent loss of functional independence or other long term morbidity. The patient provides full informed consent for the procedure as described and all questions were answered.    DETAILS OF THE OPERATIVE PROCEDURE  PREPARATION:    The patient is brought to the operating room on the above mentioned date and central monitoring was established by the anesthesia team including a radial arterial line. The patient is placed in the supine position on the operating table.  Intravenous antibiotics are administered. General endotracheal anesthesia is induced uneventfully. A Foley catheter is  placed.  Baseline transesophageal echocardiogram was performed. The patient's chest, abdomen, both groins, and both lower extremities are prepared and draped in a sterile manner. A time out procedure is performed.   Peripheral and transfemoral access was performed by Dr. Burt Knack and will be dictated in his note.  BALLOON AORTIC VALVULOPLASTY:   Not performed  TRANSCATHETER HEART VALVE DEPLOYMENT:   An Edwards Sapien 3 transcatheter heart valve (size 26 mm, model #9600TFX, serial [id number]) was prepared and crimped per manufacturer's guidelines, and the proper orientation of the valve is confirmed on the [employer] delivery system. The valve was advanced through the introducer sheath using normal technique until in an appropriate position in the abdominal aorta beyond the sheath tip. The balloon was then retracted and using the fine-tuning wheel was centered on the valve. The valve was then advanced across the aortic arch using appropriate flexion of the catheter. The valve was carefully positioned across the aortic valve annulus. The Commander catheter was retracted using normal technique. Once final position of the valve has been confirmed by angiographic assessment, the valve is deployed while temporarily holding ventilation and during rapid ventricular pacing to maintain systolic blood pressure < 50 mmHg and pulse pressure < 10 mmHg. The balloon inflation is held for >3 seconds after reaching full deployment volume. Once the balloon has fully deflated the balloon is retracted into the ascending aorta and valve function is assessed using echocardiography. There is felt to be trivial paravalvular leak and no central aortic insufficiency.  The patient's hemodynamic recovery following valve deployment is good.  The deployment balloon and  guidewire are both removed. Echo demostrated acceptable post-procedural gradients, stable mitral valve function, and trivial aortic insufficiency. Aortography  confirmed no greater than trivial aortic insufficiency.    PROCEDURE COMPLETION:   The sheath was removed and femoral artery closure performed by Dr Burt Knack. Please see his separate report for details.  Protamine was administered once femoral arterial repair was complete. The temporary pacemaker, pigtail catheters and femoral sheaths were removed with manual pressure used for hemostasis.   The patient tolerated the procedure well and is transported to the surgical intensive care in stable condition. There were no immediate intraoperative complications. All sponge instrument and needle counts are verified correct at completion of the operation.   No blood products were administered during the operation.  The patient received a total of 75 mL of intravenous contrast during the procedure.   Gaye Pollack, MD 09/29/2016 5:47 PM

## 2016-09-29 NOTE — Progress Notes (Signed)
  Echocardiogram Echocardiogram Transesophageal has been performed.  Lanora Reveron L Androw 09/29/2016, 12:51 PM

## 2016-09-29 NOTE — Op Note (Signed)
[facility]   MULTIDISCIPLINARY HEART VALVE TEAM   TAVR OPERATIVE NOTE   Date of Procedure:  09/29/2016  Preoperative Diagnosis: Severe Aortic Stenosis   Postoperative Diagnosis: Same   Procedure:   Transcatheter Aortic Valve Replacement - Percutaneous  Transfemoral Approach  Edwards Sapien 3 THV (size 26 mm, model # 9600TFX, serial # [id number])   Co-Surgeons:  Gaye Pollack, MD and Sherren Mocha, MD  Anesthesiologist:  Oleta Mouse, MD  Echocardiographer:  Ena Dawley, MD  Pre-operative Echo Findings:  Severe aortic stenosis  Normal left ventricular systolic function  Post-operative Echo Findings:  No paravalvular leak  Normal left ventricular systolic function    BRIEF CLINICAL NOTE AND INDICATIONS FOR SURGERY  83 year old African-American female with history of aortic stenosis, hypertension, type 2 diabetes mellitus with complications, and end-stage renal disease for which the patient is dialysis dependent who has been referred for evaluation and management of severe symptomatic aortic stenosis. The patient has reportedly known to have a heart murmur for many years but never underwent formal cardiology evaluation until recently. In February 2017 she was hospitalized with pneumonia and respiratory failure at [facility]. [facility] her more than 1 month to recover. She was discharged initially to a skilled nursing facility and ultimately discharged home last May. Ever since then she has complained of exertional shortness of breath. An echocardiogram was performed in October of this year and revealed low normal left ventricular systolic function with ejection fraction estimated 50-55%, moderate concentric left ventricular hypertrophy, mild to moderate mitral regurgitation, moderate tricuspid regurgitation, and moderate to severe pulmonary hypertension. There was  evidence consistent with severe aortic stenosis with peak and mean transvalvular gradients estimated 79 and 51 mmHg, respectively.  The patient  underwent repeat echocardiogram and diagnostic cardiac catheterization with echocardiogram performed 08/19/2016 revealing essentially normal left ventricular systolic function with ejection fraction estimated 55-60%. There was severe calcification with severely restricted leaflet mobility involving all 3 leaflets of the aortic valve. Peak velocity across the aortic valve was reported 4.3 m/s corresponding to mean transvalvular gradient estimated 46 mmHg. There was mild mitral regurgitation and moderate pulmonary hypertension and mild tricuspid regurgitation.  Diagnostic cardiac catheterization performed 08/19/2016 revealed minimal nonobstructive coronary artery disease and confirms the presence of severe calcific aortic stenosis with mean transvalvular gradient measured 40 mmHg by catheterization. There was severe pulmonary hypertension. The patient subsequently underwent CT angiography and then surgical evaluation. She is considered a candidate for TAVR as she is at high-risk of conventional surgery.   During the course of the patient's preoperative work up they have been evaluated comprehensively by a multidisciplinary team of specialists coordinated through the [facility] in the [facility].  They have been demonstrated to suffer from symptomatic severe aortic stenosis as noted above. The patient has been counseled extensively as to the relative risks and benefits of all options for the treatment of severe aortic stenosis including long term medical therapy, conventional surgery for aortic valve replacement, and transcatheter aortic valve replacement.  The patient has been independently evaluated by two cardiac surgeons including Dr. Roxy Manns and Dr. Cyndia Bent, and they are felt to be at high risk for conventional surgical  aortic valve replacement based upon a predicted risk of mortality using the Society of Thor[employer] Surgeons risk calculator of 16.6%. Both surgeons indicated the patient would be a poor candidate for conventional surgery (predicted risk of mortality >  15% and/or predicted risk of permanent morbidity >50%) because of comorbidities including ESRD, advanced age, and poor functional capapcity.   Based upon review of all of the patient's preoperative diagnostic tests they are felt to be candidate for transcatheter aortic valve replacement using the transfemoral approach as an alternative to high risk conventional surgery.    Following the decision to proceed with transcatheter aortic valve replacement, a discussion has been held regarding what types of management strategies would be attempted intraoperatively in the event of life-threatening complications, including whether or not the patient would be considered a candidate for the use of cardiopulmonary bypass and/or conversion to open sternotomy for attempted surgical intervention.  The patient has been advised of a variety of complications that might develop peculiar to this approach including but not limited to risks of death, stroke, paravalvular leak, aortic dissection or other major vascular complications, aortic annulus rupture, device embolization, cardiac rupture or perforation, acute myocardial infarction, arrhythmia, heart block or bradycardia requiring permanent pacemaker placement, congestive heart failure, respiratory failure, renal failure, pneumonia, infection, other late complications related to structural valve deterioration or migration, or other complications that might ultimately cause a temporary or permanent loss of functional independence or other long term morbidity.  The patient provides full informed consent for the procedure as described and all questions were answered preoperatively.    DETAILS OF THE OPERATIVE PROCEDURE  PREPARATION:      The patient is brought to the operating room on the above mentioned date and central monitoring was established by the anesthesia team including placement of a radial arterial line. The patient is placed in the supine position on the operating table.  Intravenous antibiotics are administered.   General endotracheal anesthesia is induced uneventfully.   Baseline transesophageal echocardiogram was performed. The patient's chest, abdomen, both groins, and both lower extremities are prepared and draped in a sterile manner. A time out procedure is performed.   PERIPHERAL ACCESS:    Using the modified Seldinger technique, femoral arterial and venous access was obtained with placement of 6 Fr sheaths on the left side.  A pigtail diagnostic catheter was passed through the left femoral arterial sheath under fluoroscopic guidance into the aortic root.  A temporary transvenous pacemaker catheter was passed through the left femoral venous sheath under fluoroscopic guidance into the right ventricle.  The pacemaker was tested to ensure stable lead placement and pacemaker capture. Aortic root angiography was performed in order to determine the optimal angiographic angle for valve deployment.   TRANSFEMORAL ACCESS:  A micropuncture technique is used to access the right femoral artery under fluoroscopic guidance.   2 Perclose devices are deployed at 10' and 2' positions to 'PreClose' the femoral artery. An 8 French sheath is placed and then an Amplatz Superstiff wire is advanced through the sheath. This is changed out for a 14 French transfemoral E-Sheath after progressively dilating over the Superstiff wire.  A Feldman catheter was used to direct a straight-tip exchange length wire across the native aortic valve into the left ventricle. The valve was difficult to cross with a wire. This was exchanged out for a pigtail catheter and position was confirmed in the LV apex. Simultaneous LV and Ao pressures were recorded.   The pigtail catheter was exchanged for an Amplatz Extra-stiff wire in the LV apex.  Echocardiography was utilized to confirm appropriate wire position and no sign of entanglement in the mitral subvalvular apparatus.   TRANSCATHETER HEART VALVE DEPLOYMENT:  An Edwards Sapien 3 transcatheter heart  valve (size 26 mm, model #9600TFX, serial [id number]) was prepared and crimped per manufacturer's guidelines, and the proper orientation of the valve is confirmed on the [employer] delivery system. The valve was advanced through the introducer sheath using normal technique until in an appropriate position in the abdominal aorta beyond the sheath tip. The balloon was then retracted and using the fine-tuning wheel was centered on the valve. The valve was then advanced across the aortic arch using appropriate flexion of the catheter. The valve was carefully positioned across the aortic valve annulus. The Commander catheter was retracted using normal technique. Once final position of the valve has been confirmed by angiographic assessment, the valve is deployed while temporarily holding ventilation and during rapid ventricular pacing to maintain systolic blood pressure < 50 mmHg and pulse pressure < 10 mmHg. The balloon inflation is held for >3 seconds after reaching full deployment volume. Once the balloon has fully deflated the balloon is retracted into the ascending aorta and valve function is assessed using echocardiography. There is felt to be trivial paravalvular leak and no central aortic insufficiency.  The patient's hemodynamic recovery following valve deployment is good.  The deployment balloon and guidewire are both removed. Echo demostrated acceptable post-procedural gradients, stable mitral valve function, and trivial aortic insufficiency.   PROCEDURE COMPLETION:  The sheath was removed and femoral artery closure is performed using the 2 previously deployed Perclose devices.   Protamine was administered  once femoral arterial repair was complete. The temporary pacemaker, pigtail catheters and femoral sheaths were removed with manual pressure used for hemostasis.   The patient tolerated the procedure well and is transported to the surgical intensive care in stable condition. There were no immediate intraoperative complications. All sponge instrument and needle counts are verified correct at completion of the operation.   The patient received a total of 75 mL of intravenous contrast during the procedure.   Sherren Mocha, MD 09/29/2016 4:20 PM

## 2016-09-29 NOTE — Transfer of Care (Signed)
Immediate Anesthesia Transfer of Care Note  Patient: Hailyn Trompeter  Procedure(s) Performed: Procedure(s): TRANSCATHETER AORTIC VALVE REPLACEMENT, TRANSFEMORAL (N/A) TRANSESOPHAGEAL ECHOCARDIOGRAM (TEE) (N/A)  Patient Location: SICU  Anesthesia Type:General  Level of Consciousness: sedated, patient cooperative and responds to stimulation  Airway & Oxygen Therapy: Patient Spontanous Breathing and Patient connected to nasal cannula oxygen  Post-op Assessment: Report given to RN, Post -op Vital signs reviewed and stable and Patient moving all extremities  Post vital signs: Reviewed and stable  Last Vitals:  Vitals:   09/29/16 1231 09/29/16 1236  BP:    Pulse: 69 72  Resp:    Temp:      Last Pain:  Vitals:   09/29/16 0911  TempSrc:   PainSc: 0-No pain         Complications: No apparent anesthesia complications

## 2016-09-29 NOTE — Anesthesia Procedure Notes (Signed)
Central Venous Catheter Insertion Performed by: Oleta Mouse, anesthesiologist Start/End1/05/2017 10:37 AM, 09/29/2016 10:42 AM Patient location: Pre-op. Preanesthetic checklist: patient identified, IV checked, site marked, risks and benefits discussed, surgical consent, monitors and equipment checked, pre-op evaluation, timeout performed and anesthesia consent Position: supine Lidocaine 1% used for infiltration and patient sedated Hand hygiene performed  and maximum sterile barriers used  Catheter size: 8 Fr Total catheter length 16. Central line was placed.Double lumen Procedure performed using ultrasound guided technique. Ultrasound Notes:anatomy identified, needle tip was noted to be adjacent to the nerve/plexus identified, no ultrasound evidence of intravascular and/or intraneural injection and image(s) printed for medical record Attempts: 1 Following insertion, dressing applied, line sutured and Biopatch. Post procedure assessment: blood return through all ports, free fluid flow and no air  Patient tolerated the procedure well with no immediate complications.

## 2016-09-30 ENCOUNTER — Inpatient Hospital Stay (HOSPITAL_COMMUNITY): Payer: Medicare Other

## 2016-09-30 ENCOUNTER — Encounter (HOSPITAL_COMMUNITY): Payer: Self-pay | Admitting: Cardiovascular Disease

## 2016-09-30 DIAGNOSIS — I350 Nonrheumatic aortic (valve) stenosis: Secondary | ICD-10-CM

## 2016-09-30 DIAGNOSIS — Z952 Presence of prosthetic heart valve: Secondary | ICD-10-CM

## 2016-09-30 DIAGNOSIS — I10 Essential (primary) hypertension: Secondary | ICD-10-CM

## 2016-09-30 LAB — ECHOCARDIOGRAM LIMITED: WEIGHTICAEL: 2268.09 [oz_av]

## 2016-09-30 LAB — CBC
HEMATOCRIT: 26.7 % — AB (ref 36.0–46.0)
HEMOGLOBIN: 9.1 g/dL — AB (ref 12.0–15.0)
MCH: 31.1 pg (ref 26.0–34.0)
MCHC: 34.1 g/dL (ref 30.0–36.0)
MCV: 91.1 fL (ref 78.0–100.0)
Platelets: 132 10*3/uL — ABNORMAL LOW (ref 150–400)
RBC: 2.93 MIL/uL — AB (ref 3.87–5.11)
RDW: 13.5 % (ref 11.5–15.5)
WBC: 8.6 10*3/uL (ref 4.0–10.5)

## 2016-09-30 LAB — BASIC METABOLIC PANEL
Anion gap: 10 (ref 5–15)
BUN: 19 mg/dL (ref 6–20)
CHLORIDE: 97 mmol/L — AB (ref 101–111)
CO2: 24 mmol/L (ref 22–32)
CREATININE: 6.24 mg/dL — AB (ref 0.44–1.00)
Calcium: 8.4 mg/dL — ABNORMAL LOW (ref 8.9–10.3)
GFR calc Af Amer: 6 mL/min — ABNORMAL LOW (ref >60)
GFR calc non Af Amer: 6 mL/min — ABNORMAL LOW (ref >60)
Glucose, Bld: 222 mg/dL — ABNORMAL HIGH (ref 65–99)
POTASSIUM: 4 mmol/L (ref 3.5–5.1)
Sodium: 131 mmol/L — ABNORMAL LOW (ref 135–145)

## 2016-09-30 LAB — GLUCOSE, CAPILLARY
GLUCOSE-CAPILLARY: 142 mg/dL — AB (ref 65–99)
GLUCOSE-CAPILLARY: 136 mg/dL — AB (ref 65–99)
Glucose-Capillary: 151 mg/dL — ABNORMAL HIGH (ref 65–99)
Glucose-Capillary: 101 mg/dL — ABNORMAL HIGH (ref 65–99)

## 2016-09-30 LAB — MAGNESIUM: Magnesium: 1.7 mg/dL (ref 1.7–2.4)

## 2016-09-30 MED ORDER — METOPROLOL TARTRATE 25 MG PO TABS
25.0000 mg | ORAL_TABLET | Freq: Four times a day (QID) | ORAL | Status: DC
  Administered 2016-09-30 (×4): 25 mg via ORAL
  Filled 2016-09-30 (×4): qty 1

## 2016-09-30 MED ORDER — RENA-VITE PO TABS
1.0000 | ORAL_TABLET | Freq: Every day | ORAL | Status: DC
  Administered 2016-09-30 – 2016-10-03 (×4): 1 via ORAL
  Filled 2016-09-30 (×4): qty 1

## 2016-09-30 MED ORDER — DARBEPOETIN ALFA 60 MCG/0.3ML IJ SOSY
60.0000 ug | PREFILLED_SYRINGE | INTRAMUSCULAR | Status: DC
  Administered 2016-10-01: 60 ug via INTRAVENOUS

## 2016-09-30 MED ORDER — DOXERCALCIFEROL 4 MCG/2ML IV SOLN
4.0000 ug | INTRAVENOUS | Status: DC
  Administered 2016-10-01 – 2016-10-03 (×2): 4 ug via INTRAVENOUS

## 2016-09-30 NOTE — Plan of Care (Signed)
Problem: Activity: Goal: Risk for activity intolerance will decrease Outcome: Progressing Pt to be OOB today  Problem: Bowel/Gastric: Goal: Gastrointestinal status for postoperative course will improve Outcome: Progressing Hypoactive BS  Problem: Cardiac: Goal: Hemodynamic stability will improve Outcome: Progressing On nitro gtt. Otherwise stable   Problem: Nutritional: Goal: Risk for body nutrition deficit will decrease Outcome: Progressing Progressing diet  Problem: Respiratory: Goal: Levels of oxygenation will improve Outcome: Progressing On Room air Goal: Ability to tolerate decreased levels of ventilator support will improve Outcome: Completed/Met Date Met: 09/30/16 Extubated 09/29/16  Problem: Urinary Elimination: Goal: Ability to achieve and maintain adequate renal perfusion and functioning will improve Outcome: Not Applicable Date Met: 04/02/51 HD pt

## 2016-09-30 NOTE — Anesthesia Postprocedure Evaluation (Addendum)
Anesthesia Post Note  Patient: Angela Trujillo  Procedure(s) Performed: Procedure(s) (LRB): TRANSCATHETER AORTIC VALVE REPLACEMENT, TRANSFEMORAL (N/A) TRANSESOPHAGEAL ECHOCARDIOGRAM (TEE) (N/A)  Patient location during evaluation: ICU Anesthesia Type: General Level of consciousness: awake Pain management: pain level controlled Vital Signs Assessment: post-procedure vital signs reviewed and stable Respiratory status: spontaneous breathing, respiratory function stable and patient connected to nasal cannula oxygen Cardiovascular status: stable Postop Assessment: no signs of nausea or vomiting Anesthetic complications: no       Last Vitals:  Vitals:   09/30/16 0700 09/30/16 0715  BP: (!) 151/73 (!) 148/73  Pulse: 83 83  Resp: 12 16  Temp:  36.9 C    Last Pain:  Vitals:   09/30/16 0715  TempSrc: Oral  PainSc:                  Angela Trujillo

## 2016-09-30 NOTE — Progress Notes (Signed)
  Echocardiogram 2D Echocardiogram Limited has been performed.  Angela Trujillo M 09/30/2016, 10:54 AM

## 2016-09-30 NOTE — Care Management Note (Signed)
Case Management Note  Patient Details  Name: Angela Trujillo MRN: [id number] Date of Birth: [birth year]  Subjective/Objective:   Pt OOB to chair, daughter and son present.  Per daughter, she plans to provide 24/7 assistance to pt when she is medically stable for discharge.                         Expected Discharge Plan:  [place]  Discharge planning Services  CM Consult  Status of Service:  In process, will continue to follow  Girard Cooter, RN 09/30/2016, 12:30 PM

## 2016-09-30 NOTE — Progress Notes (Signed)
[facility] BRIEF SICU PROGRESS NOTE  1 Day Post-Op  S/P Procedure(s) (LRB): TRANSCATHETER AORTIC VALVE REPLACEMENT, TRANSFEMORAL (N/A) TRANSESOPHAGEAL ECHOCARDIOGRAM (TEE) (N/A)   Stable day NSR w/ stable BP although still on NTG drip for HTN  Plan: Continue current plan  Rexene Alberts, MD 09/30/2016 7:25 PM

## 2016-09-30 NOTE — Consult Note (Signed)
[facility] Renal Consultation Note    Indication for Consultation:  Management of ESRD/hemodialysis; anemia, hypertension/volume and secondary hyperparathyroidism  HPI: Angela Trujillo is a 83 y.o. female with ESRD secondary to HTN on hemodialysis TTS. PMH also includes DM, HTN, nonobstructive CAD, pulm HTN, atrial flutter and aortic stenosis. She was admitted by cardiology with severe symptomatic aortic stenosis. She reports progressive exertional dyspnea over the past year. She underwent transcatheter aortic valve replacement on 09/29/16.  Seen sitting in chair at bedside and reports that she "feels good" following the procedure. Denies chest pain, dyspnea, nausea, vomiting, or abdominal pain. Last HD was Monday 1/8 in anticipation of surgery. Reports compliance with HD treatments.   Past Medical History:  Diagnosis Date  . Atrial flutter ([place])   . Diabetes mellitus with end stage renal disease ([place])   . Dyspnea    with exertion  . ESRD (end stage renal disease) on dialysis ([place])    HD on T,T, Sa  . GERD (gastroesophageal reflux disease)   . Hypertension   . Pneumonia 10/2015  . Severe aortic stenosis 08/19/2016   Past Surgical History:  Procedure Laterality Date  . ABDOMINAL HYSTERECTOMY    . CARDIAC CATHETERIZATION N/A 08/19/2016   Procedure: Right/Left Heart Cath and Coronary Angiography;  Surgeon: Sherren Mocha, MD;  Location: [facility];  Service: Cardiovascular;  Laterality: N/A;  . COLONOSCOPY    . IR GENERIC HISTORICAL  08/31/2016   IR RADIOLOGY PERIPHERAL GUIDED IV START 08/31/2016 Corrie Mckusick, DO [facility]  . IR GENERIC HISTORICAL  08/31/2016   IR US GUIDE VASC ACCESS RIGHT 08/31/2016 Corrie Mckusick, DO [facility]  . MULTIPLE EXTRACTIONS WITH ALVEOLOPLASTY N/A 09/25/2016   Procedure: EXTRACTION of tooth number 31 WITH ALVEOLOPLASTY AND GROSS DEBRIDEMENT OF REMAINING TEETH;  Surgeon: Lenn Cal, DDS;  Location: [facility];  Service: Oral  Surgery;  Laterality: N/A;  . TUBAL LIGATION     History reviewed. No pertinent family history. Social History:  reports that she has never smoked. She has never used smokeless tobacco. She reports that she does not drink alcohol or use drugs. Allergies  Allergen Reactions  . Asa [Aspirin] Nausea Only and Other (See Comments)    MAKES STOMACH HURT  . Codeine Rash  . Penicillins Rash     Has patient had a PCN reaction causing immediate rash, facial/tongue/throat swelling, SOB or lightheadedness with hypotension:  #  #  #  NO  #  #  #  Has patient had a PCN reaction causing severe rash involving mucus membranes or skin necrosis:  #  #  #  NO  #  #  # Has patient had a PCN reaction that required hospitalization:  #  #  #  NO  #  #  #  Has patient had a PCN reaction occurring within the last 10 years:  #  #  #  NO  #  #  #  If all answers are "NO", may proceed with Cephalosporin   Prior to Admission medications   Medication Sig Start Date End Date Taking? Authorizing Provider  atorvastatin (LIPITOR) 20 MG tablet Take 20 mg by mouth at bedtime. 06/04/16  Yes Historical Provider, MD  calcium acetate (PHOSLO) 667 MG capsule Take 667 mg by mouth daily with breakfast.   Yes Historical Provider, MD  chlorhexidine (PERIDEX) 0.12 % solution Rinse with 15 mls twice daily for 30 seconds. Use after breakfast and at bedtime. Spit out excess. Do not  swallow. 09/23/16  Yes Lenn Cal, DDS  famotidine (PEPCID) 20 MG tablet Take 20 mg by mouth 2 (two) times daily.   Yes Historical Provider, MD  HYDROcodone-acetaminophen (HYCET) 7.5-325 mg/15 ml solution Take 10-15 mLs by mouth every 6 (six) hours as needed for moderate pain or severe pain. 09/25/16 09/25/17 Yes Lenn Cal, DDS  Insulin Detemir (LEVEMIR FLEXTOUCH) 100 UNIT/ML Pen Inject 14 Units into the skin 2 (two) times daily. 06/29/16  Yes Historical Provider, MD  metoprolol succinate (TOPROL-XL) 100 MG 24 hr tablet Take 100 mg by mouth daily. 06/29/16   Yes Historical Provider, MD  SENSIPAR 30 MG tablet Take 30 mg by mouth Every Tuesday,Thursday,and Saturday with dialysis.  08/06/16  Yes Historical Provider, MD   Current Facility-Administered Medications  Medication Dose Route Frequency Provider Last Rate Last Dose  . 0.9 %  sodium chloride infusion  250 mL Intravenous PRN Sherren Mocha, MD 10 mL/hr at 09/29/16 2300 250 mL at 09/29/16 2300  . aspirin EC tablet 81 mg  81 mg Oral Daily Sherren Mocha, MD      . atorvastatin (LIPITOR) tablet 20 mg  20 mg Oral QHS Sherren Mocha, MD   20 mg at 09/29/16 2119  . calcium acetate (PHOSLO) capsule 667 mg  667 mg Oral Q breakfast Sherren Mocha, MD   667 mg at 09/30/16 0915  . [START ON 10/01/2016] cinacalcet (SENSIPAR) tablet 30 mg  30 mg Oral Q T,Th,Sa-HD Sherren Mocha, MD      . famotidine (PEPCID) IVPB 20 mg premix  20 mg Intravenous Q12H Sherren Mocha, MD      . fentaNYL (SUBLIMAZE) injection 25-50 mcg  25-50 mcg Intravenous Q1H PRN Sherren Mocha, MD   50 mcg at 09/29/16 1749  . HYDROcodone-acetaminophen (HYCET) 7.5-325 mg/15 ml solution 10-15 mL  10-15 mL Oral Q6H PRN Sherren Mocha, MD      . insulin aspart (novoLOG) injection 0-9 Units  0-9 Units Subcutaneous TID WC Sherren Mocha, MD   2 Units at 09/30/16 [phone]  . insulin detemir (LEVEMIR) injection 14 Units  14 Units Subcutaneous BID Sherren Mocha, MD   14 Units at 09/30/16 1058  . lactated ringers infusion 500 mL  500 mL Intravenous Once PRN Sherren Mocha, MD      . metoprolol (LOPRESSOR) injection 2.5-5 mg  2.5-5 mg Intravenous Q2H PRN Sherren Mocha, MD   5 mg at 09/30/16 0518  . metoprolol tartrate (LOPRESSOR) tablet 25 mg  25 mg Oral QID Fay Records, MD   25 mg at 09/30/16 1058  . nitroGLYCERIN 50 mg in dextrose 5 % 250 mL (0.2 mg/mL) infusion  0-100 mcg/min Intravenous Titrated Sherren Mocha, MD 18 mL/hr at 09/30/16 1300 60 mcg/min at 09/30/16 1300  . ondansetron (ZOFRAN) injection 4 mg  4 mg Intravenous Q6H PRN Sherren Mocha, MD       . Derrill Memo ON 10/01/2016] pantoprazole (PROTONIX) EC tablet 40 mg  40 mg Oral Daily Sherren Mocha, MD      . sodium chloride flush (NS) 0.9 % injection 3 mL  3 mL Intravenous Q12H Sherren Mocha, MD   3 mL at 09/30/16 1058  . traMADol (ULTRAM) tablet 50-100 mg  50-100 mg Oral Q4H PRN Sherren Mocha, MD       Labs: Basic Metabolic Panel:  Recent Labs Lab 09/25/16 0815  09/29/16 1208 09/29/16 1253 09/29/16 1433 09/30/16 0330  NA 133*  < > 132* 135 136 131*  K 4.3  < > 3.8 4.0 3.8 4.0  CL 100*  < > 98* 97*  --  97*  CO2 21*  --   --   --   --  24  GLUCOSE 135*  < > 137* 149* 143* 222*  BUN 29*  < > 15 15  --  19  CREATININE 6.85*  < > 5.00* 5.10*  --  6.24*  CALCIUM 8.9  --   --   --   --  8.4*  < > = values in this interval not displayed. Liver Function Tests:  Recent Labs Lab 09/25/16 0815  AST 18  ALT 15  ALKPHOS 124  BILITOT 0.8  PROT 7.0  ALBUMIN 3.2*   No results for input(s): LIPASE, AMYLASE in the last 168 hours. No results for input(s): AMMONIA in the last 168 hours. CBC:  Recent Labs Lab 09/25/16 0815  09/29/16 1427 09/29/16 1433 09/30/16 0330  WBC 7.5  --  7.2  --  8.6  HGB 11.0*  < > 9.9* 9.5* 9.1*  HCT 31.9*  < > 29.7* 28.0* 26.7*  MCV 90.4  --  91.4  --  91.1  PLT 143*  --  132*  --  132*  < > = values in this interval not displayed. Cardiac Enzymes: No results for input(s): CKTOTAL, CKMB, CKMBINDEX, TROPONINI in the last 168 hours. CBG:  Recent Labs Lab 09/29/16 1633 09/29/16 2153 09/29/16 2351 09/30/16 0818 09/30/16 1203  GLUCAP 124* 161* 164* 151* 142*   Iron Studies: No results for input(s): IRON, TIBC, TRANSFERRIN, FERRITIN in the last 72 hours. Studies/Results: Dg Chest Port 1 View  Result Date: 09/30/2016 CLINICAL DATA:  Chest soreness, post transcatheter aortic valve replacement EXAM: PORTABLE CHEST 1 VIEW COMPARISON:  Chest x-ray of 09/29/2016 FINDINGS: No active infiltrate or effusion is seen. No pneumothorax is noted. There is  cardiomegaly present and there may be very minimal pulmonary vascular congestion. Prosthetic aortic valve replacement is noted. Right IJ central venous line tip overlies the lower SVC. IMPRESSION: 1. Stable cardiomegaly. Cannot exclude mild pulmonary vascular congestion. 2. Right IJ central venous line catheter tip overlies the lower SVC. 3. Aortic valve replacement is noted. . Electronically Signed   By: Ivar Drape M.D.   On: 09/30/2016 08:33   Dg Chest Port 1 View  Result Date: 09/29/2016 CLINICAL DATA:  Severe aortic stenosis status post transcatheter aortic valve replacement. EXAM: PORTABLE CHEST 1 VIEW COMPARISON:  PA and lateral chest x-ray of September 25, 2016 FINDINGS: There has been interval placement of a prosthetic aortic valve cage Assembly. The cardiac silhouette remains enlarged. The pulmonary vascularity is mildly prominent centrally. There is calcification in the wall of the aortic arch. There is no pleural effusion or pneumothorax. The right internal jugular venous catheter tip projects over the lower third of the SVC. IMPRESSION: No postprocedure complication observed following transcatheter aortic valve replacement. Mild stable cardiomegaly with minimal pulmonary vascular congestion. Thoracic aortic atherosclerosis. Electronically Signed   By: David  Martinique M.D.   On: 09/29/2016 14:53    ROS: As per HPI otherwise negative.  Physical Exam: Vitals:   09/30/16 1230 09/30/16 1245 09/30/16 1300 09/30/16 1315  BP: (!) 146/71 (!) 153/72 136/71 (!) 151/79  Pulse: 76 77 77 76  Resp: 15 18 17 11   Temp:      TempSrc:      SpO2: 100% 99% 100% 100%  Weight:         General: WDWN elderly AAF NAD Head: NCAT sclera not icteric MMM Neck: Supple. No JVD  Lungs: CTA bilaterally without wheezes, rales, or rhonchi. Breathing is unlabored. Heart: RRR 2/6 systolic murmur  Abdomen: soft NT + BS   Lower extremities:without edema or ischemic changes, no open wounds  Neuro: A & O  X 3. Moves all  extremities spontaneously. Psych:  Responds to questions appropriately with a normal affect. Dialysis Access: RUE AVG +thrill/bruit   Dialysis Orders:  [facility] OP Dialysis  TTS 3.5 hours BFR 350/700 F180 2K/2Ca  EDW 63.5 kg  RUE AVG #16 needles Heparin Bolus 2600 U IV bolus  Aranesp 25 mcg IV q TH Hectorol 4 mcg IV q HD   Assessment/Plan: 1.  Aortic stenosis s/p TAVR on 1/9 - per admit - Echo pending  2.  ESRD -  TTS - cont HD on schedule - for HD tomorrow  3.  Hypertension/volume  - BP slightly elevated on metoprolol / no volume excess by exam  4.  Anemia  - Hgb 9.1 cont ESA - will give Aranesp 60 mcg IV with HD tomorrow  5.  Metabolic bone disease -  Cont VDRA/Ca acetate binder  6.  Nutrition - renal diet/vitamins  Lynnda Child PA-C [facility] Pager [phone] 09/30/2016, 3:00 PM   Pt seen, examined and agree w A/P as above.  Kelly Splinter MD [employer] pager [phone]   09/30/2016, 3:55 PM

## 2016-09-30 NOTE — Progress Notes (Signed)
Subjective: No CP  No SOB  Eager to go home   Objective: Vitals:   09/30/16 0630 09/30/16 0645 09/30/16 0700 09/30/16 0715  BP: (!) 151/75 (!) 156/75 (!) 151/73 (!) 148/73  Pulse: 83 83 83 83  Resp: 16 14 12 16   Temp:    98.5 F (36.9 C)  TempSrc:    Oral  SpO2: 98% 97% 98% 98%  Weight:       Weight change:   Intake/Output Summary (Last 24 hours) at 09/30/16 0853 Last data filed at 09/30/16 0700  Gross per 24 hour  Intake          1223.95 ml  Output              150 ml  Net          1073.95 ml    General: Alert, awake, oriented x3, in no acute distress Neck:  JVP is normal Heart: Regular rate and rhythm, I-IIVI systolic murmur  No , rubs, gallops.  Lungs: RElatively clear Exemities:  No edema.   Neuro: Grossly intact, nonfocal.  Tele  SR   Lab Results: Results for orders placed or performed during the hospital encounter of 09/29/16 (from the past 24 hour(s))  Surgical pcr screen     Status: None   Collection Time: 09/29/16  9:53 AM  Result Value Ref Range   MRSA, PCR NEGATIVE NEGATIVE   Staphylococcus aureus NEGATIVE NEGATIVE  I-STAT, chem 8     Status: Abnormal   Collection Time: 09/29/16 11:12 AM  Result Value Ref Range   Sodium 134 (L) 135 - 145 mmol/L   Potassium 4.2 3.5 - 5.1 mmol/L   Chloride 98 (L) 101 - 111 mmol/L   BUN 15 6 - 20 mg/dL   Creatinine, Ser 5.10 (H) 0.44 - 1.00 mg/dL   Glucose, Bld 132 (H) 65 - 99 mg/dL   Calcium, Ion 1.12 (L) 1.15 - 1.40 mmol/L   TCO2 28 0 - 100 mmol/L   Hemoglobin 9.9 (L) 12.0 - 15.0 g/dL   HCT 29.0 (L) 36.0 - 46.0 %  I-STAT, chem 8     Status: Abnormal   Collection Time: 09/29/16 12:08 PM  Result Value Ref Range   Sodium 132 (L) 135 - 145 mmol/L   Potassium 3.8 3.5 - 5.1 mmol/L   Chloride 98 (L) 101 - 111 mmol/L   BUN 15 6 - 20 mg/dL   Creatinine, Ser 5.00 (H) 0.44 - 1.00 mg/dL   Glucose, Bld 137 (H) 65 - 99 mg/dL   Calcium, Ion 1.06 (L) 1.15 - 1.40 mmol/L   TCO2 26 0 - 100 mmol/L   Hemoglobin 9.2 (L) 12.0 -  15.0 g/dL   HCT 27.0 (L) 36.0 - 46.0 %  I-STAT, chem 8     Status: Abnormal   Collection Time: 09/29/16 12:53 PM  Result Value Ref Range   Sodium 135 135 - 145 mmol/L   Potassium 4.0 3.5 - 5.1 mmol/L   Chloride 97 (L) 101 - 111 mmol/L   BUN 15 6 - 20 mg/dL   Creatinine, Ser 5.10 (H) 0.44 - 1.00 mg/dL   Glucose, Bld 149 (H) 65 - 99 mg/dL   Calcium, Ion 1.16 1.15 - 1.40 mmol/L   TCO2 27 0 - 100 mmol/L   Hemoglobin 9.5 (L) 12.0 - 15.0 g/dL   HCT 28.0 (L) 36.0 - 46.0 %  CBC     Status: Abnormal   Collection Time: 09/29/16  2:27 PM  Result  Value Ref Range   WBC 7.2 4.0 - 10.5 K/uL   RBC 3.25 (L) 3.87 - 5.11 MIL/uL   Hemoglobin 9.9 (L) 12.0 - 15.0 g/dL   HCT 29.7 (L) 36.0 - 46.0 %   MCV 91.4 78.0 - 100.0 fL   MCH 30.5 26.0 - 34.0 pg   MCHC 33.3 30.0 - 36.0 g/dL   RDW 13.5 11.5 - 15.5 %   Platelets 132 (L) 150 - 400 K/uL  Protime-INR     Status: Abnormal   Collection Time: 09/29/16  2:27 PM  Result Value Ref Range   Prothrombin Time 15.4 (H) 11.4 - 15.2 seconds   INR 1.21   APTT     Status: None   Collection Time: 09/29/16  2:27 PM  Result Value Ref Range   aPTT 35 24 - 36 seconds  I-STAT 3, arterial blood gas (G3+)     Status: Abnormal   Collection Time: 09/29/16  2:28 PM  Result Value Ref Range   pH, Arterial 7.430 7.350 - 7.450   pCO2 arterial 41.3 32.0 - 48.0 mmHg   pO2, Arterial 178.0 (H) 83.0 - 108.0 mmHg   Bicarbonate 27.6 20.0 - 28.0 mmol/L   TCO2 29 0 - 100 mmol/L   O2 Saturation 100.0 %   Acid-Base Excess 3.0 (H) 0.0 - 2.0 mmol/L   Patient temperature 36.2 C    Collection site ARTERIAL LINE    Drawn by Nurse    Sample type ARTERIAL   I-STAT 4, (NA,K, GLUC, HGB,HCT)     Status: Abnormal   Collection Time: 09/29/16  2:33 PM  Result Value Ref Range   Sodium 136 135 - 145 mmol/L   Potassium 3.8 3.5 - 5.1 mmol/L   Glucose, Bld 143 (H) 65 - 99 mg/dL   HCT 28.0 (L) 36.0 - 46.0 %   Hemoglobin 9.5 (L) 12.0 - 15.0 g/dL  Glucose, capillary     Status: Abnormal    Collection Time: 09/29/16  4:33 PM  Result Value Ref Range   Glucose-Capillary 124 (H) 65 - 99 mg/dL   Comment 1 Capillary Specimen    Comment 2 Notify RN   Glucose, capillary     Status: Abnormal   Collection Time: 09/29/16  9:53 PM  Result Value Ref Range   Glucose-Capillary 161 (H) 65 - 99 mg/dL   Comment 1 Capillary Specimen    Comment 2 Notify RN    Comment 3 Document in Chart   Glucose, capillary     Status: Abnormal   Collection Time: 09/29/16 11:51 PM  Result Value Ref Range   Glucose-Capillary 164 (H) 65 - 99 mg/dL   Comment 1 Capillary Specimen    Comment 2 Notify RN    Comment 3 Document in Chart   CBC     Status: Abnormal   Collection Time: 09/30/16  3:30 AM  Result Value Ref Range   WBC 8.6 4.0 - 10.5 K/uL   RBC 2.93 (L) 3.87 - 5.11 MIL/uL   Hemoglobin 9.1 (L) 12.0 - 15.0 g/dL   HCT 26.7 (L) 36.0 - 46.0 %   MCV 91.1 78.0 - 100.0 fL   MCH 31.1 26.0 - 34.0 pg   MCHC 34.1 30.0 - 36.0 g/dL   RDW 13.5 11.5 - 15.5 %   Platelets 132 (L) 150 - 400 K/uL  Basic metabolic panel     Status: Abnormal   Collection Time: 09/30/16  3:30 AM  Result Value Ref Range   Sodium 131 (L)  135 - 145 mmol/L   Potassium 4.0 3.5 - 5.1 mmol/L   Chloride 97 (L) 101 - 111 mmol/L   CO2 24 22 - 32 mmol/L   Glucose, Bld 222 (H) 65 - 99 mg/dL   BUN 19 6 - 20 mg/dL   Creatinine, Ser 6.24 (H) 0.44 - 1.00 mg/dL   Calcium 8.4 (L) 8.9 - 10.3 mg/dL   GFR calc non Af Amer 6 (L) >60 mL/min   GFR calc Af Amer 6 (L) >60 mL/min   Anion gap 10 5 - 15  Magnesium     Status: None   Collection Time: 09/30/16  3:30 AM  Result Value Ref Range   Magnesium 1.7 1.7 - 2.4 mg/dL  Glucose, capillary     Status: Abnormal   Collection Time: 09/30/16  8:18 AM  Result Value Ref Range   Glucose-Capillary 151 (H) 65 - 99 mg/dL   Comment 1 Notify RN     Studies/Results: Dg Chest Port 1 View  Result Date: 09/30/2016 CLINICAL DATA:  Chest soreness, post transcatheter aortic valve replacement EXAM: PORTABLE CHEST  1 VIEW COMPARISON:  Chest x-ray of 09/29/2016 FINDINGS: No active infiltrate or effusion is seen. No pneumothorax is noted. There is cardiomegaly present and there may be very minimal pulmonary vascular congestion. Prosthetic aortic valve replacement is noted. Right IJ central venous line tip overlies the lower SVC. IMPRESSION: 1. Stable cardiomegaly. Cannot exclude mild pulmonary vascular congestion. 2. Right IJ central venous line catheter tip overlies the lower SVC. 3. Aortic valve replacement is noted. . Electronically Signed   By: Ivar Drape M.D.   On: 09/30/2016 08:33   Dg Chest Port 1 View  Result Date: 09/29/2016 CLINICAL DATA:  Severe aortic stenosis status post transcatheter aortic valve replacement. EXAM: PORTABLE CHEST 1 VIEW COMPARISON:  PA and lateral chest x-ray of September 25, 2016 FINDINGS: There has been interval placement of a prosthetic aortic valve cage Assembly. The cardiac silhouette remains enlarged. The pulmonary vascularity is mildly prominent centrally. There is calcification in the wall of the aortic arch. There is no pleural effusion or pneumothorax. The right internal jugular venous catheter tip projects over the lower third of the SVC. IMPRESSION: No postprocedure complication observed following transcatheter aortic valve replacement. Mild stable cardiomegaly with minimal pulmonary vascular congestion. Thoracic aortic atherosclerosis. Electronically Signed   By: David  Martinique M.D.   On: 09/29/2016 14:53    Medications: REviewed     @PROBHOSP @  1  AS  Day 1 Post TAVR  Echo ordered    2  HTN  BP mildlly increased  Will increase metoprolol to 25 q 6 hours  (Pt on Toprol XL 100 at home )  FOllow BP and HR     LOS: 1 day   Dorris Carnes 09/30/2016, 8:53 AM

## 2016-10-01 DIAGNOSIS — I350 Nonrheumatic aortic (valve) stenosis: Principal | ICD-10-CM

## 2016-10-01 LAB — CBC
HCT: 25.6 % — ABNORMAL LOW (ref 36.0–46.0)
Hemoglobin: 8.9 g/dL — ABNORMAL LOW (ref 12.0–15.0)
MCH: 31.2 pg (ref 26.0–34.0)
MCHC: 34.8 g/dL (ref 30.0–36.0)
MCV: 89.8 fL (ref 78.0–100.0)
PLATELETS: 128 10*3/uL — AB (ref 150–400)
RBC: 2.85 MIL/uL — ABNORMAL LOW (ref 3.87–5.11)
RDW: 13.2 % (ref 11.5–15.5)
WBC: 8.6 10*3/uL (ref 4.0–10.5)

## 2016-10-01 LAB — RENAL FUNCTION PANEL
Albumin: 2.5 g/dL — ABNORMAL LOW (ref 3.5–5.0)
Anion gap: 11 (ref 5–15)
BUN: 28 mg/dL — AB (ref 6–20)
CHLORIDE: 97 mmol/L — AB (ref 101–111)
CO2: 24 mmol/L (ref 22–32)
CREATININE: 8.16 mg/dL — AB (ref 0.44–1.00)
Calcium: 8.5 mg/dL — ABNORMAL LOW (ref 8.9–10.3)
GFR calc Af Amer: 5 mL/min — ABNORMAL LOW (ref >60)
GFR, EST NON AFRICAN AMERICAN: 4 mL/min — AB (ref >60)
Glucose, Bld: 63 mg/dL — ABNORMAL LOW (ref 65–99)
Phosphorus: 5 mg/dL — ABNORMAL HIGH (ref 2.5–4.6)
Potassium: 4.2 mmol/L (ref 3.5–5.1)
SODIUM: 132 mmol/L — AB (ref 135–145)

## 2016-10-01 LAB — GLUCOSE, CAPILLARY
Glucose-Capillary: 78 mg/dL (ref 65–99)
Glucose-Capillary: 135 mg/dL — ABNORMAL HIGH (ref 65–99)
Glucose-Capillary: 109 mg/dL — ABNORMAL HIGH (ref 65–99)

## 2016-10-01 MED ORDER — METOPROLOL SUCCINATE ER 100 MG PO TB24
100.0000 mg | ORAL_TABLET | Freq: Every day | ORAL | Status: DC
  Administered 2016-10-01 – 2016-10-04 (×4): 100 mg via ORAL
  Filled 2016-10-01 (×2): qty 4
  Filled 2016-10-01: qty 1
  Filled 2016-10-01: qty 4

## 2016-10-01 MED ORDER — SODIUM CHLORIDE 0.9 % IV SOLN: 100.0000 mL | INTRAVENOUS | Status: DC | PRN

## 2016-10-01 MED ORDER — CLOPIDOGREL BISULFATE 75 MG PO TABS
75.0000 mg | ORAL_TABLET | Freq: Every day | ORAL | Status: DC
  Administered 2016-10-01 – 2016-10-04 (×4): 75 mg via ORAL
  Filled 2016-10-01 (×4): qty 1

## 2016-10-01 MED ORDER — DOXERCALCIFEROL 4 MCG/2ML IV SOLN
INTRAVENOUS | Status: AC
  Administered 2016-10-01: 4 ug via INTRAVENOUS
  Filled 2016-10-01: qty 2

## 2016-10-01 MED ORDER — HEPARIN SODIUM (PORCINE) 1000 UNIT/ML DIALYSIS: 20.0000 [IU]/kg | INTRAMUSCULAR | Status: DC | PRN

## 2016-10-01 MED ORDER — HYDRALAZINE HCL 50 MG PO TABS
50.0000 mg | ORAL_TABLET | Freq: Three times a day (TID) | ORAL | Status: DC
  Administered 2016-10-01 – 2016-10-04 (×10): 50 mg via ORAL
  Filled 2016-10-01 (×10): qty 1

## 2016-10-01 MED ORDER — ALTEPLASE 2 MG IJ SOLR: 2.0000 mg | Freq: Once | INTRAMUSCULAR | Status: DC | PRN

## 2016-10-01 MED ORDER — DARBEPOETIN ALFA 60 MCG/0.3ML IJ SOSY
PREFILLED_SYRINGE | INTRAMUSCULAR | Status: AC
  Filled 2016-10-01: qty 0.3

## 2016-10-01 MED ORDER — LIDOCAINE HCL (PF) 1 % IJ SOLN: 5.0000 mL | INTRAMUSCULAR | Status: DC | PRN

## 2016-10-01 MED ORDER — LIDOCAINE-PRILOCAINE 2.5-2.5 % EX CREA: 1.0000 "application " | TOPICAL_CREAM | CUTANEOUS | Status: DC | PRN

## 2016-10-01 MED ORDER — HEPARIN SODIUM (PORCINE) 1000 UNIT/ML DIALYSIS
1000.0000 [IU] | INTRAMUSCULAR | Status: DC | PRN
  Filled 2016-10-01: qty 1

## 2016-10-01 MED ORDER — CLONIDINE HCL 0.1 MG PO TABS
0.1000 mg | ORAL_TABLET | Freq: Two times a day (BID) | ORAL | Status: DC
  Administered 2016-10-01 – 2016-10-04 (×7): 0.1 mg via ORAL
  Filled 2016-10-01 (×7): qty 1

## 2016-10-01 MED ORDER — PENTAFLUOROPROP-TETRAFLUOROETH EX AERO: 1.0000 "application " | INHALATION_SPRAY | CUTANEOUS | Status: DC | PRN

## 2016-10-01 NOTE — Progress Notes (Signed)
Patient ambulated in hallway 200 ft with walker. Patient tolerated walk well. Patient back to chair with call bell in reach. Family at bedside.  Rowe Pavy, RN

## 2016-10-01 NOTE — Progress Notes (Signed)
Inpatient Diabetes Program Recommendations  AACE/ADA: New Consensus Statement on Inpatient Glycemic Control (2015)  Target Ranges:  Prepandial:   less than 140 mg/dL      Peak postprandial:   less than 180 mg/dL (1-2 hours)      Critically ill patients:  140 - 180 mg/dL   Lab Results  Component Value Date   GLUCAP 101 (H) 09/30/2016   HGBA1C 8.5 (H) 09/25/2016    Review of Glycemic Control:  Results for KASSADY, EISEN (MRN [id number]) as of 10/01/2016 10:52  Ref. Range 10/01/2016 07:45  Glucose Latest Ref Range: 65 - 99 mg/dL 63 (L)   Diabetes history: Type 2 diabetes Outpatient Diabetes medications: Levemir 14 units bid Current orders for Inpatient glycemic control:  Levemir 14 units bid , Novolog sensitive tid with meals  Inpatient Diabetes Program Recommendations:   Note fasting lab glucose low.  Please consider reducing Levemir to 10 units bid based on fasting glucose this morning.     Thanks, Adah Perl, RN, BC-ADM Inpatient Diabetes Coordinator Pager [phone] (8a-5p)

## 2016-10-01 NOTE — Progress Notes (Signed)
Subjective:  Doing well this am. Seen in dialysis. No CP or dyspnea.   Objective:  Vital Signs in the last 24 hours: Temp:  [97.7 F (36.5 C)-99.2 F (37.3 C)] 97.7 F (36.5 C) (01/11 0400) Pulse Rate:  [73-86] 75 (01/11 0600) Resp:  [10-28] 19 (01/11 0600) BP: (136-164)/(64-99) 154/71 (01/11 0600) SpO2:  [97 %-100 %] 100 % (01/11 0600) Arterial Line BP: (161-183)/(61-73) 170/65 (01/10 1530)  Intake/Output from previous day: 01/10 0701 - 01/11 0700 In: 1344.8 [P.O.:690; I.V.:654.8] Out: 39 [Urine:55]  Physical Exam: Pt is alert and oriented, NAD HEENT: normal Neck: JVP - normal Lungs: CTA bilaterally CV: RRR without murmur or gallop Abd: soft, NT, Positive BS, no hepatomegaly Ext: no C/C/E, bilateral groin sites clear Skin: warm/dry no rash   Lab Results:  Recent Labs  09/29/16 1427 09/29/16 1433 09/30/16 0330  WBC 7.2  --  8.6  HGB 9.9* 9.5* 9.1*  PLT 132*  --  132*    Recent Labs  09/29/16 1253 09/29/16 1433 09/30/16 0330  NA 135 136 131*  K 4.0 3.8 4.0  CL 97*  --  97*  CO2  --   --  24  GLUCOSE 149* 143* 222*  BUN 15  --  19  CREATININE 5.10*  --  6.24*   No results for input(s): TROPONINI in the last 72 hours.  Invalid input(s): CK, MB  Cardiac Studies: 2D Echo: Left ventricle:  The cavity size was normal. Systolic function was normal. The estimated ejection fraction was in the range of 50% to 55%.  Regional wall motion abnormalities:   There is akinesis of the midinferoseptal myocardium. Features are consistent with a pseudonormal left ventricular filling pattern, with concomitant abnormal relaxation and increased filling pressure (grade 2 diastolic dysfunction).  ------------------------------------------------------------------- Aortic valve:  A TAVR bioprosthesis was present and functioning normally.  Doppler:  Transvalvular velocity was within the normal range. There was no stenosis. There was no regurgitation.    VTI ratio of  LVOT to aortic valve: 0.47. Valve area (VTI): 1.49 cm^2. Indexed valve area (VTI): 0.86 cm^2/m^2. Peak velocity ratio of LVOT to aortic valve: 0.48. Valve area (Vmax): 1.5 cm^2. Indexed valve area (Vmax): 0.87 cm^2/m^2. Mean velocity ratio of LVOT to aortic valve: 0.47. Valve area (Vmean): 1.47 cm^2. Indexed valve area (Vmean): 0.85 cm^2/m^2.    Mean gradient (S): 15 mm Hg. Peak gradient (S): 23 mm Hg.  ------------------------------------------------------------------- Aorta:  Aortic root: The aortic root was normal in size.  ------------------------------------------------------------------- Mitral valve:   Moderately calcified annulus. Mobility of the posterior leaflet was mildly restricted.  Doppler:   The findings are consistent with mild stenosis.   There was mild regurgitation.   Valve area by continuity equation (using LVOT flow): 1.84 cm^2. Indexed valve area by continuity equation (using LVOT flow): 1.07 cm^2/m^2.    Mean gradient (D): 5 mm Hg. Peak gradient (D): 6 mm Hg.  ------------------------------------------------------------------- Left atrium:  The atrium was normal in size.  ------------------------------------------------------------------- Right ventricle:  The cavity size was normal. Wall thickness was normal. Systolic function was normal.  ------------------------------------------------------------------- Pulmonic valve:    Structurally normal valve.   Cusp separation was normal.  Doppler:  Transvalvular velocity was within the normal range. There was no evidence for stenosis. There was no regurgitation.  ------------------------------------------------------------------- Tricuspid valve:   Structurally normal valve.    Doppler: Transvalvular velocity was within the normal range. There was mild regurgitation.  ------------------------------------------------------------------- Pulmonary artery:   The main pulmonary artery was  normal-sized. Systolic pressure was within the normal range.  ------------------------------------------------------------------- Right atrium:  The atrium was normal in size. There was the appearance of a Chiari network.  ------------------------------------------------------------------- Pericardium:  A trivial pericardial effusion was identified.  ------------------------------------------------------------------- Systemic veins: Inferior vena cava: The vessel was normal in size.   Tele: Sinus rhythm  Assessment/Plan:  1. Severe aortic stenosis with acute on chronic diastolic heart failure, stage D disease, now postoperative day #2 from TAVR 2. Hypertension with renal failure, uncontrolled 3. End-stage renal disease  Overall the patient continues to do remarkably well. She is still requiring IV nitroglycerin because of hypertension. We'll get her back on hydralazine and clonidine today and wean her off of nitroglycerin. I will start both medications back at half of her home dose since today is a dialysis day. We will reassess tomorrow morning and anticipate transferring her out of the ICU as soon as she is off of IV nitroglycerin.   Sherren Mocha, M.D. 10/01/2016, 7:15 AM

## 2016-10-01 NOTE — Procedures (Signed)
Stable on HD this am with minimal UF (1 L).  BP's good.  No SOB.  No volume excess on exam.  Will follow.    I was present at this dialysis session, have reviewed the session itself and made  appropriate changes Kelly Splinter MD Olivia Lopez de Gutierrez pager [phone]   10/01/2016, 10:54 AM

## 2016-10-02 ENCOUNTER — Inpatient Hospital Stay (HOSPITAL_COMMUNITY): Payer: Medicare Other

## 2016-10-02 DIAGNOSIS — R410 Disorientation, unspecified: Secondary | ICD-10-CM

## 2016-10-02 DIAGNOSIS — R4182 Altered mental status, unspecified: Secondary | ICD-10-CM

## 2016-10-02 DIAGNOSIS — I350 Nonrheumatic aortic (valve) stenosis: Secondary | ICD-10-CM

## 2016-10-02 DIAGNOSIS — Z954 Presence of other heart-valve replacement: Secondary | ICD-10-CM

## 2016-10-02 LAB — GLUCOSE, CAPILLARY
GLUCOSE-CAPILLARY: 88 mg/dL (ref 65–99)
GLUCOSE-CAPILLARY: 67 mg/dL (ref 65–99)
GLUCOSE-CAPILLARY: 90 mg/dL (ref 65–99)
Glucose-Capillary: 202 mg/dL — ABNORMAL HIGH (ref 65–99)
Glucose-Capillary: 33 mg/dL — CL (ref 65–99)
Glucose-Capillary: 93 mg/dL (ref 65–99)
Glucose-Capillary: 160 mg/dL — ABNORMAL HIGH (ref 65–99)
Glucose-Capillary: 230 mg/dL — ABNORMAL HIGH (ref 65–99)

## 2016-10-02 LAB — CBC
HCT: 28.5 % — ABNORMAL LOW (ref 36.0–46.0)
Hemoglobin: 9.7 g/dL — ABNORMAL LOW (ref 12.0–15.0)
MCH: 30.9 pg (ref 26.0–34.0)
MCHC: 34 g/dL (ref 30.0–36.0)
MCV: 90.8 fL (ref 78.0–100.0)
Platelets: 135 10*3/uL — ABNORMAL LOW (ref 150–400)
RBC: 3.14 MIL/uL — ABNORMAL LOW (ref 3.87–5.11)
RDW: 13.5 % (ref 11.5–15.5)
WBC: 9 10*3/uL (ref 4.0–10.5)

## 2016-10-02 LAB — COMPREHENSIVE METABOLIC PANEL
ALT: 12 U/L — ABNORMAL LOW (ref 14–54)
AST: 21 U/L (ref 15–41)
Albumin: 2.5 g/dL — ABNORMAL LOW (ref 3.5–5.0)
Alkaline Phosphatase: 114 U/L (ref 38–126)
Anion gap: 13 (ref 5–15)
BUN: 21 mg/dL — ABNORMAL HIGH (ref 6–20)
CO2: 26 mmol/L (ref 22–32)
Calcium: 8.3 mg/dL — ABNORMAL LOW (ref 8.9–10.3)
Chloride: 95 mmol/L — ABNORMAL LOW (ref 101–111)
Creatinine, Ser: 5.78 mg/dL — ABNORMAL HIGH (ref 0.44–1.00)
GFR calc Af Amer: 7 mL/min — ABNORMAL LOW (ref >60)
GFR calc non Af Amer: 6 mL/min — ABNORMAL LOW (ref >60)
Glucose, Bld: 32 mg/dL — CL (ref 65–99)
Potassium: 3.4 mmol/L — ABNORMAL LOW (ref 3.5–5.1)
Sodium: 134 mmol/L — ABNORMAL LOW (ref 135–145)
Total Bilirubin: 0.6 mg/dL (ref 0.3–1.2)
Total Protein: 6 g/dL — ABNORMAL LOW (ref 6.5–8.1)

## 2016-10-02 MED ORDER — SODIUM CHLORIDE 0.9% FLUSH
10.0000 mL | INTRAVENOUS | Status: DC | PRN
Start: 2016-10-02 — End: 2016-10-04

## 2016-10-02 MED ORDER — DEXTROSE 50 % IV SOLN
INTRAVENOUS | Status: AC
  Administered 2016-10-02: 50 mL
  Filled 2016-10-02: qty 50

## 2016-10-02 MED ORDER — NITROGLYCERIN IN D5W 200-5 MCG/ML-% IV SOLN
INTRAVENOUS | Status: AC
  Filled 2016-10-02: qty 250

## 2016-10-02 MED ORDER — PRO-STAT SUGAR FREE PO LIQD
30.0000 mL | Freq: Two times a day (BID) | ORAL | Status: DC
  Administered 2016-10-02 – 2016-10-04 (×4): 30 mL via ORAL
  Filled 2016-10-02 (×4): qty 30

## 2016-10-02 MED ORDER — LORAZEPAM 2 MG/ML IJ SOLN
1.0000 mg | Freq: Once | INTRAMUSCULAR | Status: AC
  Administered 2016-10-02: 1.5 mg via INTRAVENOUS

## 2016-10-02 MED ORDER — DEXTROSE 50 % IV SOLN
INTRAVENOUS | Status: AC
  Administered 2016-10-02: 25 mL via INTRAVENOUS
  Filled 2016-10-02: qty 50

## 2016-10-02 MED ORDER — DEXTROSE 50 % IV SOLN
25.0000 mL | Freq: Once | INTRAVENOUS | Status: AC
  Administered 2016-10-02: 25 mL via INTRAVENOUS

## 2016-10-02 MED ORDER — SODIUM CHLORIDE 0.9% FLUSH: 10.0000 mL | Freq: Two times a day (BID) | INTRAVENOUS | Status: DC

## 2016-10-02 MED ORDER — NITROGLYCERIN IN D5W 200-5 MCG/ML-% IV SOLN
0.0000 ug/min | INTRAVENOUS | Status: DC
  Administered 2016-10-02: 5 ug/min via INTRAVENOUS

## 2016-10-02 NOTE — Progress Notes (Signed)
Inpatient Diabetes Program Recommendations  AACE/ADA: New Consensus Statement on Inpatient Glycemic Control (2015)  Target Ranges:  Prepandial:   less than 140 mg/dL      Peak postprandial:   less than 180 mg/dL (1-2 hours)      Critically ill patients:  140 - 180 mg/dL   Results for LORIS, SOLINGER (MRN [id number]) as of 10/02/2016 09:44  Ref. Range 10/02/2016 06:03  Glucose-Capillary Latest Ref Range: 65 - 99 mg/dL 33 (LL)    Home DM Meds: Levemir 14 units BID  Current Insulin Orders: Novolog Sensitive Correction Scale/ SSI (0-9 units) TID AC       Note patient with Severe Hypoglycemia this AM (CBG down to 33 mg/dl) after receiving Levemir 14 units BID yesterday.    Note Levemir stopped this morning.  MD- When decision made to restart Levemir, please consider starting 50% home dose and titrate upward as needed based on fasting glucose     --Will follow patient during hospitalization--  Wyn Quaker RN, MSN, CDE Diabetes Coordinator Inpatient Glycemic Control Team Team Pager: [phone] (8a-5p)

## 2016-10-02 NOTE — Progress Notes (Signed)
3 Days Post-Op Procedure(s) (LRB): TRANSCATHETER AORTIC VALVE REPLACEMENT, TRANSFEMORAL (N/A) TRANSESOPHAGEAL ECHOCARDIOGRAM (TEE) (N/A) Subjective:  Events of overnight noted. She had mental status changes with agitation but non-focal. There was concern about a stroke and she went down for head CT but had to be sedated to do the CT. She was given 1.5 mg Ativan and was then sedated and had the CT which was negative for stroke. Seen by neurology and not felt to be stroke. On return to ICU her glucose was 32 on am labs which is likely why she had mental status changes. She was only 63 yesterday am and 109 last night and received home Levemir dose bid.   She is sedated this am but awakens and answers questions but falls back to sleep.  Objective: Vital signs in last 24 hours: Temp:  [96 F (35.6 C)-99.8 F (37.7 C)] 96 F (35.6 C) (01/12 0854) Pulse Rate:  [54-93] 54 (01/12 0800) Cardiac Rhythm: Normal sinus rhythm (01/12 0600) Resp:  [13-31] 15 (01/12 0800) BP: (107-186)/(48-88) 120/68 (01/12 0800) SpO2:  [93 %-100 %] 100 % (01/12 0800) Weight:  [63.5 kg (139 lb 15.9 oz)] 63.5 kg (139 lb 15.9 oz) (01/11 1043)  Hemodynamic parameters for last 24 hours:    Intake/Output from previous day: 01/11 0701 - 01/12 0700 In: 265.8 [I.V.:265.8] Out: 881  Intake/Output this shift: No intake/output data recorded.  General appearance: slowed mentation Neurologic: intact Heart: regular rate and rhythm, S1, S2 normal, no murmur, click, rub or gallop Lungs: clear to auscultation bilaterally Wound: groin sites ok  Lab Results:  Recent Labs  10/01/16 0744 10/02/16 0420  WBC 8.6 9.0  HGB 8.9* 9.7*  HCT 25.6* 28.5*  PLT 128* 135*   BMET:  Recent Labs  10/01/16 0745 10/02/16 0420  NA 132* 134*  K 4.2 3.4*  CL 97* 95*  CO2 24 26  GLUCOSE 63* 32*  BUN 28* 21*  CREATININE 8.16* 5.78*  CALCIUM 8.5* 8.3*    PT/INR:  Recent Labs  09/29/16 1427  LABPROT 15.4*  INR 1.21   ABG     Component Value Date/Time   PHART 7.430 09/29/2016 1428   HCO3 27.6 09/29/2016 1428   TCO2 29 09/29/2016 1428   O2SAT 100.0 09/29/2016 1428   CBG (last 3)   Recent Labs  10/02/16 0603 10/02/16 0614 10/02/16 0801  GLUCAP 33* 202* 88    Assessment/Plan: S/P Procedure(s) (LRB): TRANSCATHETER AORTIC VALVE REPLACEMENT, TRANSFEMORAL (N/A) TRANSESOPHAGEAL ECHOCARDIOGRAM (TEE) (N/A)  She is hemodynamically stable in sinus rhythm.  Mental status changes overnight likely due to hypoglycemia and possibly some postop ICU psychosis. Sedated now from Ativan but this will resolve. She awakens and responds and has no respiratory compromise so will not give Romazicon.  Hold Levemir for now and continue CBG's and SSI as needed. She is not eating yet today.  Tolerated HD yesterday. Probably will have HD tomorrow.  Will keep in ICU until she is back to baseline mental status and glucose stable.   LOS: 3 days    Gaye Pollack 10/02/2016

## 2016-10-02 NOTE — Progress Notes (Signed)
Bedside EEG completed, results pending. 

## 2016-10-02 NOTE — Progress Notes (Signed)
[facility] Progress Note  Dialysis Orders:  [facility] OP Dialysis  TTS 3.5 hours BFR 350/700 F180 2K/2Ca  EDW 63.5 kg  RUE AVG #16 needles Heparin Bolus 2600 U IV bolus  Aranesp 25 mcg IV q TH Hectorol 4 mcg IV q HD   Assessment/Plan: 1. Aortic stenosis s/p TAVR on 1/9 - per admit 2. AMS - seen by neuro Head CT negative for stroke low BS this am - appears at baseline currently  3.  ESRD -  TTS - cont HD on schedule - for HD tomorrow  4.  Hypertension/volume  - BP controlled on metoprolol / no volume excess by exam now at EDW  5.  Anemia  - Hgb 9.7 cont ESA -  Aranesp 60 mcg IV on 1/11 6.  Metabolic bone disease -  Cont VDRA/Ca acetate binder  7.  Nutrition - renal diet/vitamins/ protein supp  8. DM - per primary    Lynnda Child PA-C [place] Pager [phone] 10/02/2016,4:11 PM  LOS: 3 days   Pt seen, examined and agree w A/P as above.  Kelly Splinter MD [employer] pager [phone]   10/02/2016, 5:03 PM    Subjective:  AMS overnight. Head CT negative EEG nonspecific Feels "like myself" this afternoon. OOB walking this morning and ate breakfast.  No c/os currently   Objective Vitals:   10/02/16 0900 10/02/16 1000 10/02/16 1100 10/02/16 1200  BP: (!) 125/58 (!) 118/57 (!) 142/64 (!) 144/67  Pulse: (!) 57 (!) 58 62 (!) 34  Resp: 15 12 15 17   Temp:    (!) 96.3 F (35.7 C)  TempSrc:    Axillary  SpO2: 100% 100% 100% 100%  Weight:      Height:       Physical Exam General: WNWD Elderly female, pleasant A&O x3 Heart: RRR 2/6 systolic murmur Lungs: CTAB Abdomen: soft BS+ Extremities: no LE edema  Dialysis Access: RUE AVG +thrill   Additional Objective Labs: Basic Metabolic Panel:  Recent Labs Lab 09/30/16 0330 10/01/16 0745 10/02/16 0420  NA 131* 132* 134*  K 4.0 4.2 3.4*  CL 97* 97* 95*  CO2 24 24 26   GLUCOSE 222* 63* 32*  BUN 19 28* 21*  CREATININE 6.24* 8.16* 5.78*  CALCIUM 8.4* 8.5* 8.3*   PHOS  --  5.0*  --    Liver Function Tests:  Recent Labs Lab 10/01/16 0745 10/02/16 0420  AST  --  21  ALT  --  12*  ALKPHOS  --  114  BILITOT  --  0.6  PROT  --  6.0*  ALBUMIN 2.5* 2.5*   No results for input(s): LIPASE, AMYLASE in the last 168 hours. CBC:  Recent Labs Lab 09/29/16 1427  09/30/16 0330 10/01/16 0744 10/02/16 0420  WBC 7.2  --  8.6 8.6 9.0  HGB 9.9*  < > 9.1* 8.9* 9.7*  HCT 29.7*  < > 26.7* 25.6* 28.5*  MCV 91.4  --  91.1 89.8 90.8  PLT 132*  --  132* 128* 135*  < > = values in this interval not displayed. Blood Culture No results found for: SDES, SPECREQUEST, CULT, REPTSTATUS  Cardiac Enzymes: No results for input(s): CKTOTAL, CKMB, CKMBINDEX, TROPONINI in the last 168 hours. CBG:  Recent Labs Lab 10/02/16 0614 10/02/16 0801 10/02/16 0955 10/02/16 1040 10/02/16 1158  GLUCAP 202* 88 67 90 93   Iron Studies: No results for input(s): IRON, TIBC, TRANSFERRIN, FERRITIN in the last 72 hours. Lab Results  Component Value Date   INR 1.21 09/29/2016   INR 1.13 09/25/2016   INR 1.09 08/19/2016   Medications: . nitroGLYCERIN Stopped (10/02/16 0400)   . aspirin EC  81 mg Oral Daily  . atorvastatin  20 mg Oral QHS  . calcium acetate  667 mg Oral Q breakfast  . cinacalcet  30 mg Oral Q T,Th,Sa-HD  . cloNIDine  0.1 mg Oral BID  . clopidogrel  75 mg Oral Daily  . darbepoetin (ARANESP) injection - DIALYSIS  60 mcg Intravenous Q Thu-HD  . doxercalciferol  4 mcg Intravenous Q T,Th,Sa-HD  . hydrALAZINE  50 mg Oral Q8H  . insulin aspart  0-9 Units Subcutaneous TID WC  . metoprolol succinate  100 mg Oral Daily  . multivitamin  1 tablet Oral QHS  . pantoprazole  40 mg Oral Daily  . sodium chloride flush  3 mL Intravenous Q12H

## 2016-10-02 NOTE — Progress Notes (Signed)
Patient ID: Angela Trujillo, female   DOB: 1931-08-13, 83 y.o.   MRN: [id number]  SICU Evening rounds:  She is hemodynamically stable  Woke up this afternoon and ate all of dinner.  Glucose 160 this pm.  Ambulated today  Continue to monitor in SICU tonight.

## 2016-10-02 NOTE — Procedures (Signed)
ELECTROENCEPHALOGRAM REPORT  Date of Study: 10/02/2016  Patient's Name: Terisa Willing MRN: [id number] Date of Birth: 16-Jan-1931  Referring Provider: Dr. Kerney Elbe  Clinical History: This is an 83 year old woman with altered mental status.  Medications: aspirin EC tablet 81 mg  atorvastatin (LIPITOR) tablet 20 mg  calcium acetate (PHOSLO) capsule 667 mg  cinacalcet (SENSIPAR) tablet 30 mg  cloNIDine (CATAPRES) tablet 0.1 mg  clopidogrel (PLAVIX) tablet 75 mg  Darbepoetin Alfa (ARANESP) injection 60 mcg  doxercalciferol (HECTOROL) injection 4 mcg  heparin injection 1,000 Units  heparin injection 1,300 Units  hydrALAZINE (APRESOLINE) tablet 50 mg  HYDROcodone-acetaminophen (HYCET) 7.5-325 mg/15 ml solution 10-15 mL  insulin aspart (novoLOG) injection 0-9 Units  lidocaine (PF) (XYLOCAINE) 1 % injection 5 mL  lidocaine-prilocaine (EMLA) cream 1 application  metoprolol (LOPRESSOR) injection 2.5-5 mg  metoprolol succinate (TOPROL-XL) 24 hr tablet 100 mg  multivitamin (RENA-VIT) tablet 1 tablet  nitroGLYCERIN 50 mg in dextrose 5 % 250 mL (0.2 mg/mL) infusion  ondansetron (ZOFRAN) injection 4 mg  pantoprazole (PROTONIX) EC tablet 40 mg  traMADol (ULTRAM) tablet 50-100 mg   Technical Summary: A multichannel digital EEG recording measured by the international 10-20 system with electrodes applied with paste and impedances below 5000 ohms performed in our laboratory with EKG monitoring in a predominantly drowsy and asleep patient.  Hyperventilation and photic stimulation were not performed.  The digital EEG was referentially recorded, reformatted, and digitally filtered in a variety of bipolar and referential montages for optimal display.    Description: The patient is predominantly drowsy and asleep during the recording.  During brief period of wakefulness, there is a symmetric, medium voltage 7.5 Hz posterior dominant rhythm that poorly attenuates with eye opening and eye closure.   The record is symmetric.  During drowsiness and sleep, there is an increase in theta and delta slowing of the background with poorly formed sleep spindles seen. Hyperventilation and photic stimulation were not performed.  There were no epileptiform discharges or electrographic seizures seen.    EKG lead was unremarkable.  Impression: This predominantly drowsy and asleep EEG is mildly abnormal due to slowing of the posterior dominant rhythm.  Clinical Correlation of the above findings indicates mild diffuse cerebral dysfunction that is nonspecific in etiology, and may be seen with hypoxic/ischemic injury, toxic/metabolic encephalopathies, medication effect, or with excessive drowsiness. The absence of epileptiform discharges does not exclude a clinical diagnosis of epilepsy.  Clinical correlation is advised.   [name], M.D.

## 2016-10-02 NOTE — Progress Notes (Signed)
Mrs. Raynolds woke up from sleeping and began screaming at the top of her lungs. When I went in the room to ask her what was wrong, she kept repeating "something is not right" over and over again. She denies pain in her head, chest, abdomen, and legs. Over the next few minutes her speech changed from clear and normal tone, to loud, slurred, and demonic in nature. She began repeating "[name] is not here." When asked if her name was Lamiah Shah, her response was "I am not [name]." I asked her to squeeze my hands- grips equal and strong. She is able to move both legs equally. She began attempting to hit and kick staff. Vital signs stable- see flowsheet. [name] with rapid response called to beside to do NIH stroke screen. Dr. Prescott Gum called and made aware of neuro changes. Orders received for labs, chest xray, head CT, turn back on Nitro drip, to have neurology see her, and Ativan x 1 dose for CT.  NIH scale attempted and unable to be completed due to patient's behavior. Brought her to CT and administered Ativan. After Ativan administration, patient sleeping and arouses only to painful stimuli. Will continue to closely monitor. Richarda Blade RN

## 2016-10-02 NOTE — Consult Note (Signed)
NEURO HOSPITALIST CONSULT NOTE   Requestig physician: Dr. Burt Knack  Reason for Consult: Acute delirium  History obtained from:  Nursing Staff and Chart     HPI:                                                                                                                                          Angela Trujillo is an 83 y.o. female who presented with severe symptomatic aortic stenosis. She underwent TAVR on Tuesday 1/9. Follow up notes in EPIC document patient as alert and oriented on Thursday morning and able to ambulate in Angela hallway with a walker in Angela evening, tolerating Angela activity well.   At 3 AM this morning, her mental status abruptly changed. Per nurse's note: "Angela Trujillo woke up from sleeping and began screaming at Angela top of her lungs. When I went in Angela room to ask her what was wrong, she kept repeating "something is not right" over and over again. She denies pain in her head, chest, abdomen, and legs. Over Angela next few minutes her speech changed from clear and normal tone, to loud, slurred, and demonic in nature. She began repeating "Angela Trujillo is not here." When asked if her name was Angela Trujillo, her response was "I am not Angela Trujillo." I asked her to squeeze my hands- grips equal and strong. She is able to move both legs equally. She began attempting to hit and kick staff. Vital signs stable- see flowsheet. Angela Trujillo with rapid response called to beside to do NIH stroke screen. Dr. Prescott Gum called and made aware of neuro changes. Orders received for labs, chest xray, head CT, turn back on Nitro drip, to have neurology see her, and Ativan x 1 dose for CT.  NIH scale attempted and unable to be completed due to patient's behavior. Brought her to CT and administered Ativan. After Ativan administration, patient sleeping and arouses only to painful stimuli."     Neurological exam performed by ERT, per report, revealed dysarthria, equal strength in all 4 extremities and no  facial droop or aphasia. Speech pattern and her violent behavior as described, were most consistent with acute delirium.   Ativan administration was required for STAT CT head. Examination by Neurology was compromised by sedated state after Ativan. Exam as described below was nonlateralizing.   CT head revealed no acute abnormality. Chronic microvascular ischemia and volume loss were noted.   Past Medical History:  Diagnosis Date  . Atrial flutter ([place])   . Diabetes mellitus with end stage renal disease ([place])   . Dyspnea    with exertion  . ESRD (end stage renal disease) on dialysis ([place])    HD on T,T, Sa  . GERD (gastroesophageal reflux disease)   . Hypertension   . Pneumonia  10/2015  . Severe aortic stenosis 08/19/2016    Past Surgical History:  Procedure Laterality Date  . ABDOMINAL HYSTERECTOMY    . CARDIAC CATHETERIZATION N/A 08/19/2016   Procedure: Right/Left Heart Cath and Coronary Angiography;  Surgeon: Sherren Mocha, MD;  Location: [facility];  Service: Cardiovascular;  Laterality: N/A;  . COLONOSCOPY    . IR GENERIC HISTORICAL  08/31/2016   IR RADIOLOGY PERIPHERAL GUIDED IV START 08/31/2016 Corrie Mckusick, DO [facility]  . IR GENERIC HISTORICAL  08/31/2016   IR US GUIDE VASC ACCESS RIGHT 08/31/2016 Corrie Mckusick, DO [facility]  . MULTIPLE EXTRACTIONS WITH ALVEOLOPLASTY N/A 09/25/2016   Procedure: EXTRACTION of tooth number 31 WITH ALVEOLOPLASTY AND GROSS DEBRIDEMENT OF REMAINING TEETH;  Surgeon: Lenn Cal, DDS;  Location: [facility];  Service: Oral Surgery;  Laterality: N/A;  . TEE WITHOUT CARDIOVERSION N/A 09/29/2016   Procedure: TRANSESOPHAGEAL ECHOCARDIOGRAM (TEE);  Surgeon: Sherren Mocha, MD;  Location: [facility];  Service: Open Heart Surgery;  Laterality: N/A;  . TRANSCATHETER AORTIC VALVE REPLACEMENT, TRANSFEMORAL N/A 09/29/2016   Procedure: TRANSCATHETER AORTIC VALVE REPLACEMENT, TRANSFEMORAL;  Surgeon: Sherren Mocha, MD;  Location: [place] [facility];  Service: Open  Heart Surgery;  Laterality: N/A;  . TUBAL LIGATION     History reviewed. No pertinent family history.  Social History:  reports that she has never smoked. She has never used smokeless tobacco. She reports that she does not drink alcohol or use drugs.  Allergies  Allergen Reactions  . Asa [Aspirin] Nausea Only and Other (See Comments)    MAKES STOMACH HURT  . Codeine Rash  . Penicillins Rash     Has patient had a PCN reaction causing immediate rash, facial/tongue/throat swelling, SOB or lightheadedness with hypotension:  #  #  #  NO  #  #  #  Has patient had a PCN reaction causing severe rash involving mucus membranes or skin necrosis:  #  #  #  NO  #  #  # Has patient had a PCN reaction that required hospitalization:  #  #  #  NO  #  #  #  Has patient had a PCN reaction occurring within Angela last 10 years:  #  #  #  NO  #  #  #  If all answers are "NO", may proceed with Cephalosporin    MEDICATIONS:                                                                                                                     Scheduled: . aspirin EC  81 mg Oral Daily  . atorvastatin  20 mg Oral QHS  . calcium acetate  667 mg Oral Q breakfast  . cinacalcet  30 mg Oral Q T,Th,Sa-HD  . cloNIDine  0.1 mg Oral BID  . clopidogrel  75 mg Oral Daily  . darbepoetin (ARANESP) injection - DIALYSIS  60 mcg Intravenous Q Thu-HD  . doxercalciferol  4 mcg Intravenous Q T,Th,Sa-HD  . hydrALAZINE  50 mg Oral Q8H  . insulin aspart  0-9 Units Subcutaneous TID WC  . insulin detemir  14 Units Subcutaneous BID  . metoprolol succinate  100 mg Oral Daily  . multivitamin  1 tablet Oral QHS  . pantoprazole  40 mg Oral Daily  . sodium chloride flush  3 mL Intravenous Q12H     ROS:                                                                                                                                       Unable to obtain review of systems due to AMS/sedation.   Blood pressure (!) 176/82, pulse 86,  temperature 98.5 F (36.9 C), temperature source Oral, resp. rate 20, height 5\' 5"  (1.651 m), weight 63.5 kg (139 lb 15.9 oz), SpO2 100 %.  General Examination:                                                                                                      HEENT-  Normocephalic/atraumatic. Neck supple.  Lungs- Sonorous respirations in context of sedation and patient asleep.  Extremities- Warm and well perfused.   Neurological Examination Mental Status: Somnolent/sedated. Does not follow commands. Semipurposeful movements to noxious stimuli.  Cranial Nerves: II: No blink to threat in context of sleep/somnolence/sedation. PERRL.   III,IV, VI: Eyes conjugate when eyelids held open with mild roving EOM. No nystagmus or forced deviation.   V,VII: Grimace symmetric to noxious VIII: unable to formally assess IX,X: unable to formally assess XI: no asymmetry noted XII: Unable to formally assess Motor/Sensory: Withdraws all 4 extremities equally to noxious stimuli. Tone normal in context of sedation.  Deep Tendon Reflexes: Normoactive and symmetric x 4.  Plantars: Right: downgoing   Left: downgoing Cerebellar/Gait: Unable to formally assess.    Lab Results: Basic Metabolic Panel:  Recent Labs Lab 09/25/16 0815 09/29/16 1112 09/29/16 1208 09/29/16 1253 09/29/16 1433 09/30/16 0330 10/01/16 0745  NA 133* 134* 132* 135 136 131* 132*  K 4.3 4.2 3.8 4.0 3.8 4.0 4.2  CL 100* 98* 98* 97*  --  97* 97*  CO2 21*  --   --   --   --  24 24  GLUCOSE 135* 132* 137* 149* 143* 222* 63*  BUN 29* 15 15 15   --  19 28*  CREATININE 6.85* 5.10* 5.00* 5.10*  --  6.24* 8.16*  CALCIUM 8.9  --   --   --   --  8.4* 8.5*  MG  --   --   --   --   --  1.7  --   PHOS  --   --   --   --   --   --  5.0*    Liver Function Tests:  Recent Labs Lab 09/25/16 0815 10/01/16 0745  AST 18  --   ALT 15  --   ALKPHOS 124  --   BILITOT 0.8  --   PROT 7.0  --   ALBUMIN 3.2* 2.5*   No results for input(s):  LIPASE, AMYLASE in Angela last 168 hours. No results for input(s): AMMONIA in Angela last 168 hours.  CBC:  Recent Labs Lab 09/25/16 0815  09/29/16 1253 09/29/16 1427 09/29/16 1433 09/30/16 0330 10/01/16 0744  WBC 7.5  --   --  7.2  --  8.6 8.6  HGB 11.0*  < > 9.5* 9.9* 9.5* 9.1* 8.9*  HCT 31.9*  < > 28.0* 29.7* 28.0* 26.7* 25.6*  MCV 90.4  --   --  91.4  --  91.1 89.8  PLT 143*  --   --  132*  --  132* 128*  < > = values in this interval not displayed.  Cardiac Enzymes: No results for input(s): CKTOTAL, CKMB, CKMBINDEX, TROPONINI in Angela last 168 hours.  Lipid Panel: No results for input(s): CHOL, TRIG, HDL, CHOLHDL, VLDL, LDLCALC in Angela last 168 hours.  CBG:  Recent Labs Lab 09/30/16 1637 09/30/16 2155 10/01/16 1222 10/01/16 1651 10/01/16 2135  GLUCAP 136* 101* 74 135* 109*    Microbiology: Results for orders placed or performed during Angela hospital encounter of 09/29/16  Surgical pcr screen     Status: None   Collection Time: 09/29/16  9:53 AM  Result Value Ref Range Status   MRSA, PCR NEGATIVE NEGATIVE Final   Staphylococcus aureus NEGATIVE NEGATIVE Final    Comment:        Angela Xpert SA Assay (FDA approved for NASAL specimens in patients over 20 years of age), is one component of a comprehensive surveillance program.  Test performance has been validated by [facility] for patients greater than or equal to 25 year old. It is not intended to diagnose infection nor to guide or monitor treatment.     Coagulation Studies:  Recent Labs  09/29/16 1427  LABPROT 15.4*  INR 1.21    Imaging: Dg Chest Port 1 View  Result Date: 10/02/2016 CLINICAL DATA:  Status post transcatheter aortic valve replacement. Altered mental status. Code stroke. EXAM: PORTABLE CHEST 1 VIEW COMPARISON:  09/30/2016 FINDINGS: Postoperative aortic valve prosthesis. Cardiac enlargement without vascular congestion. No focal airspace disease or consolidation in Angela lungs. No blunting of  costophrenic angles. No pneumothorax. Right central venous catheter with tip over Angela cavoatrial junction region. Calcification of Angela aorta. Vascular stent in Angela right axilla. IMPRESSION: Cardiac enlargement.  No evidence of active pulmonary disease. Electronically Signed   By: Lucienne Capers M.D.   On: 10/02/2016 03:40   Dg Chest Port 1 View  Result Date: 09/30/2016 CLINICAL DATA:  Chest soreness, post transcatheter aortic valve replacement EXAM: PORTABLE CHEST 1 VIEW COMPARISON:  Chest x-ray of 09/29/2016 FINDINGS: No active infiltrate or effusion is seen. No pneumothorax is noted. There is cardiomegaly present and there may be very minimal pulmonary vascular congestion. Prosthetic aortic valve replacement is noted. Right IJ central venous line tip overlies Angela lower SVC. IMPRESSION: 1. Stable cardiomegaly. Cannot exclude mild pulmonary vascular congestion. 2. Right IJ central venous line catheter tip overlies Angela lower SVC. 3. Aortic valve replacement is noted. . Electronically Signed  By: Ivar Drape M.D.   On: 09/30/2016 08:33    Assessment: 83 year old female POD #3 after TAVR, with acute onset of agitated delirium.  1. Findings on exam non-lateralizing. CT head negative for acute findings. Overall clinical picture most consistent with agitated delirium resolved with Ativan and not suggestive of stroke. DDx includes toxic, metabolic and infectious etiologies, as well as possible benzodiazepine or EtOH withdrawal.  2. S/P TAVR.  3. Atrial flutter.   Recommendations: 1. Work up for possible toxic/metabolic and infectious etiologies for Angela patient's acute delirium. 2. CIWA protocol.  3. EEG.  4. MRI brain.  5. Continue ASA, Plavix and atorvastatin.   Electronically signed: Dr. Kerney Elbe 10/02/2016, 3:55 AM

## 2016-10-02 NOTE — Progress Notes (Signed)
Hypoglycemic Event  CBG:  32  Treatment: 1 amp D50  Symptoms: agitation  Follow-up CBG: [id number] CBG Result: 202  Possible Reasons for Event: inadequate intake   Comments/MD notified:    Bing Quarry

## 2016-10-02 NOTE — Significant Event (Signed)
Rapid Response Event Note  Overview: Time Called: 0316 Arrival Time: 0323 Event Type: Neurologic  Initial Focused Assessment:     Called by Ebony Hail, RN requesting NIHSS scale per Dr Lawson Fiscal on this pt who was LSN at 0230.  Pt awoke at 0300 yelling "something is not right" and "the lord is not here" repeatedly  with speech slurred and sounding guteral and "demonic" in nature.  Upon my arrival to room pt is yelling in guteral, demonic tone with speech slurred stating "I am not Rosaria Ferries, [employer]", spitting and kicking at staff. Uncooperative with NIHSS but was scored a 16 with points given for incorrect month and age, following commands,  Motor  3 points in all extremeties, and dysarthria.  Interventions: Code stroke called at 47, Transported to radiology for head CT.   Pt given ativan 1.5 mg IV prior to CT per Dr Prescott Gum.  Code stroke cancelled at [place] per Dr Cheral Marker.    Plan of Care (if not transferred): Continue hourly neuro checks, monitor Cardiac and respiratory status per SICU protocol.  Hand off report given to [facility], RN upon pt return to 2S08.  Will follow as needed.  Event Summary: Name of Physician Notified: Dr Darcey Nora at 0300 [facility] by Ebony Hail, RN 2S)  Name of Consulting Physician Notified: Dr Cheral Marker at 0330  Outcome: Stayed in room and stabalized  Event End Time: 0400  Ester Rink

## 2016-10-02 NOTE — Plan of Care (Signed)
Problem: Physical Regulation: Goal: Postoperative complications will be avoided or minimized Outcome: Progressing Hypoglycemic episode today, addressed on rounds

## 2016-10-02 NOTE — Progress Notes (Signed)
    Subjective:  Events overnight noted. Pt now sitting in chair this afternoon, sleeping but easy to arouse and conversant when she awakens. No CP or dyspnea. Pt reports that her blood sugar was low last night.   Objective:  Vital Signs in the last 24 hours: Temp:  [96 F (35.6 C)-99.8 F (37.7 C)] 96.3 F (35.7 C) (01/12 1200) Pulse Rate:  [34-93] 34 (01/12 1200) Resp:  [12-24] 17 (01/12 1200) BP: (107-186)/(52-82) 144/67 (01/12 1200) SpO2:  [96 %-100 %] 100 % (01/12 1200)  Intake/Output from previous day: 01/11 0701 - 01/12 0700 In: 265.8 [I.V.:265.8] Out: 881   Physical Exam: Pt is alert, oriented, elderly woman in NAD HEENT: normal Neck: JVP - normal Lungs: CTA bilaterally CV: RRR without murmur or gallop Abd: soft, NT Ext: no C/C/E Skin: warm/dry no rash   Lab Results:  Recent Labs  10/01/16 0744 10/02/16 0420  WBC 8.6 9.0  HGB 8.9* 9.7*  PLT 128* 135*    Recent Labs  10/01/16 0745 10/02/16 0420  NA 132* 134*  K 4.2 3.4*  CL 97* 95*  CO2 24 26  GLUCOSE 63* 32*  BUN 28* 21*  CREATININE 8.16* 5.78*   No results for input(s): TROPONINI in the last 72 hours.  Invalid input(s): CK, MB  Tele: Sinus rhythm  Assessment/Plan:  1. Acute on chronic diastolic CHF with severe Stage D aortic stenosis: has responded well to TAVR, now POD#3. Volume management with hemodialysis. Continue ASA and plavix.   2. Altered mental status - likely related to hypoglycemia. Improved mental status this afternoon - she is aware of what happened last night. Levemir discontinued now on only SSI.  3. ESRD - appreciate nephrology care.   4. Malignant HTN with renal failure: BP now controlled on combination of clonidine, metoprolol succinate, and hydralazine.  Sherren Mocha, M.D. 10/02/2016, 4:43 PM

## 2016-10-03 DIAGNOSIS — R400 Somnolence: Secondary | ICD-10-CM

## 2016-10-03 DIAGNOSIS — Z952 Presence of prosthetic heart valve: Secondary | ICD-10-CM

## 2016-10-03 DIAGNOSIS — I10 Essential (primary) hypertension: Secondary | ICD-10-CM

## 2016-10-03 DIAGNOSIS — R404 Transient alteration of awareness: Secondary | ICD-10-CM

## 2016-10-03 LAB — RENAL FUNCTION PANEL
ALBUMIN: 2.5 g/dL — AB (ref 3.5–5.0)
Anion gap: 14 (ref 5–15)
BUN: 42 mg/dL — AB (ref 6–20)
CO2: 23 mmol/L (ref 22–32)
CREATININE: 7.7 mg/dL — AB (ref 0.44–1.00)
Calcium: 8 mg/dL — ABNORMAL LOW (ref 8.9–10.3)
Chloride: 96 mmol/L — ABNORMAL LOW (ref 101–111)
GFR calc Af Amer: 5 mL/min — ABNORMAL LOW (ref >60)
GFR, EST NON AFRICAN AMERICAN: 4 mL/min — AB (ref >60)
Glucose, Bld: 123 mg/dL — ABNORMAL HIGH (ref 65–99)
PHOSPHORUS: 5.1 mg/dL — AB (ref 2.5–4.6)
POTASSIUM: 4.2 mmol/L (ref 3.5–5.1)
Sodium: 133 mmol/L — ABNORMAL LOW (ref 135–145)

## 2016-10-03 LAB — GLUCOSE, CAPILLARY
GLUCOSE-CAPILLARY: 83 mg/dL (ref 65–99)
GLUCOSE-CAPILLARY: 95 mg/dL (ref 65–99)
Glucose-Capillary: 314 mg/dL — ABNORMAL HIGH (ref 65–99)
Glucose-Capillary: 324 mg/dL — ABNORMAL HIGH (ref 65–99)

## 2016-10-03 LAB — CBC
HEMATOCRIT: 28.9 % — AB (ref 36.0–46.0)
Hemoglobin: 10 g/dL — ABNORMAL LOW (ref 12.0–15.0)
MCH: 31.3 pg (ref 26.0–34.0)
MCHC: 34.6 g/dL (ref 30.0–36.0)
MCV: 90.3 fL (ref 78.0–100.0)
Platelets: 149 10*3/uL — ABNORMAL LOW (ref 150–400)
RBC: 3.2 MIL/uL — AB (ref 3.87–5.11)
RDW: 13.3 % (ref 11.5–15.5)
WBC: 7.8 10*3/uL (ref 4.0–10.5)

## 2016-10-03 MED ORDER — SODIUM CHLORIDE 0.9 % IV SOLN
100.0000 mL | INTRAVENOUS | Status: DC | PRN
Start: 2016-10-03 — End: 2016-10-04

## 2016-10-03 MED ORDER — HEPARIN SODIUM (PORCINE) 1000 UNIT/ML DIALYSIS
20.0000 [IU]/kg | INTRAMUSCULAR | Status: DC | PRN
  Filled 2016-10-03: qty 2

## 2016-10-03 MED ORDER — DOXERCALCIFEROL 4 MCG/2ML IV SOLN
INTRAVENOUS | Status: AC
  Administered 2016-10-03: 4 ug via INTRAVENOUS
  Filled 2016-10-03: qty 2

## 2016-10-03 MED ORDER — INSULIN ASPART 100 UNIT/ML ~~LOC~~ SOLN
0.0000 [IU] | Freq: Three times a day (TID) | SUBCUTANEOUS | Status: DC
  Administered 2016-10-03: 7 [IU] via SUBCUTANEOUS

## 2016-10-03 MED ORDER — ALTEPLASE 2 MG IJ SOLR: 2.0000 mg | Freq: Once | INTRAMUSCULAR | Status: DC | PRN

## 2016-10-03 MED ORDER — SODIUM CHLORIDE 0.9 % IV SOLN: 100.0000 mL | INTRAVENOUS | Status: DC | PRN

## 2016-10-03 NOTE — Progress Notes (Addendum)
Subjective: Back to baseline. Woke up this afternoon and ate all of dinner. Also ambulated today  Objective: Current vital signs: BP 132/60   Pulse 72   Temp 97.7 F (36.5 C) (Oral)   Resp (!) 24   Ht 5' 5"  (1.651 m)   Wt 63.5 kg (139 lb 15.9 oz)   SpO2 99%   BMI 23.30 kg/m  Vital signs in last 24 hours: Temp:  [96 F (35.6 C)-97.7 F (36.5 C)] 97.7 F (36.5 C) (01/12 2000) Pulse Rate:  [34-93] 72 (01/12 2200) Resp:  [12-24] 24 (01/12 2200) BP: (107-186)/(50-82) 132/60 (01/12 2200) SpO2:  [86 %-100 %] 99 % (01/12 2200)  Intake/Output from previous day: 01/12 0701 - 01/13 0700 In: 243 [P.O.:240; I.V.:3] Out: 100 [Urine:100] Intake/Output this shift: No intake/output data recorded. Nutritional status: Diet Carb Modified Fluid consistency: Thin; Room service appropriate? Yes  Neurologic Exam: Ment: Awake and alert. Good eye contact. Pleasant and cooperative. Follows all commands. Oriented to self, place and "Thursday". CN: [place] normally. EOMI. Face symmetric.  Motor: Moves limbs equally all 4 extremities.  Cerebellar: No ataxia with FNF bilaterally.   Lab Results: Results for orders placed or performed during the hospital encounter of 09/29/16 (from the past 48 hour(s))  CBC     Status: Abnormal   Collection Time: 10/01/16  7:44 AM  Result Value Ref Range   WBC 8.6 4.0 - 10.5 K/uL   RBC 2.85 (L) 3.87 - 5.11 MIL/uL   Hemoglobin 8.9 (L) 12.0 - 15.0 g/dL   HCT 25.6 (L) 36.0 - 46.0 %   MCV 89.8 78.0 - 100.0 fL   MCH 31.2 26.0 - 34.0 pg   MCHC 34.8 30.0 - 36.0 g/dL   RDW 13.2 11.5 - 15.5 %   Platelets 128 (L) 150 - 400 K/uL  Renal function panel     Status: Abnormal   Collection Time: 10/01/16  7:45 AM  Result Value Ref Range   Sodium 132 (L) 135 - 145 mmol/L   Potassium 4.2 3.5 - 5.1 mmol/L   Chloride 97 (L) 101 - 111 mmol/L   CO2 24 22 - 32 mmol/L   Glucose, Bld 63 (L) 65 - 99 mg/dL   BUN 28 (H) 6 - 20 mg/dL   Creatinine, Ser 8.16 (H) 0.44 - 1.00 mg/dL    Calcium 8.5 (L) 8.9 - 10.3 mg/dL   Phosphorus 5.0 (H) 2.5 - 4.6 mg/dL   Albumin 2.5 (L) 3.5 - 5.0 g/dL   GFR calc non Af Amer 4 (L) >60 mL/min   GFR calc Af Amer 5 (L) >60 mL/min    Comment: (NOTE) The eGFR has been calculated using the CKD EPI equation. This calculation has not been validated in all clinical situations. eGFR's persistently <60 mL/min signify possible Chronic Kidney Disease.    Anion gap 11 5 - 15  Glucose, capillary     Status: None   Collection Time: 10/01/16 12:22 PM  Result Value Ref Range   Glucose-Capillary 78 65 - 99 mg/dL   Comment 1 Capillary Specimen    Comment 2 Notify RN   Glucose, capillary     Status: Abnormal   Collection Time: 10/01/16  4:51 PM  Result Value Ref Range   Glucose-Capillary 135 (H) 65 - 99 mg/dL   Comment 1 Capillary Specimen    Comment 2 Notify RN   Glucose, capillary     Status: Abnormal   Collection Time: 10/01/16  9:35 PM  Result Value Ref Range  Glucose-Capillary 109 (H) 65 - 99 mg/dL   Comment 1 Capillary Specimen    Comment 2 Notify RN    Comment 3 Document in Chart   Comprehensive metabolic panel     Status: Abnormal   Collection Time: 10/02/16  4:20 AM  Result Value Ref Range   Sodium 134 (L) 135 - 145 mmol/L   Potassium 3.4 (L) 3.5 - 5.1 mmol/L    Comment: DELTA CHECK NOTED   Chloride 95 (L) 101 - 111 mmol/L   CO2 26 22 - 32 mmol/L   Glucose, Bld 32 (LL) 65 - 99 mg/dL    Comment: CRITICAL RESULT CALLED TO, READ BACK BY AND VERIFIED WITH: HAGGARD,A RN 10/02/2016 0558 JORDANS    BUN 21 (H) 6 - 20 mg/dL   Creatinine, Ser 5.78 (H) 0.44 - 1.00 mg/dL   Calcium 8.3 (L) 8.9 - 10.3 mg/dL   Total Protein 6.0 (L) 6.5 - 8.1 g/dL   Albumin 2.5 (L) 3.5 - 5.0 g/dL   AST 21 15 - 41 U/L   ALT 12 (L) 14 - 54 U/L   Alkaline Phosphatase 114 38 - 126 U/L   Total Bilirubin 0.6 0.3 - 1.2 mg/dL   GFR calc non Af Amer 6 (L) >60 mL/min   GFR calc Af Amer 7 (L) >60 mL/min    Comment: (NOTE) The eGFR has been calculated using the  CKD EPI equation. This calculation has not been validated in all clinical situations. eGFR's persistently <60 mL/min signify possible Chronic Kidney Disease.    Anion gap 13 5 - 15  CBC     Status: Abnormal   Collection Time: 10/02/16  4:20 AM  Result Value Ref Range   WBC 9.0 4.0 - 10.5 K/uL   RBC 3.14 (L) 3.87 - 5.11 MIL/uL   Hemoglobin 9.7 (L) 12.0 - 15.0 g/dL   HCT 28.5 (L) 36.0 - 46.0 %   MCV 90.8 78.0 - 100.0 fL   MCH 30.9 26.0 - 34.0 pg   MCHC 34.0 30.0 - 36.0 g/dL   RDW 13.5 11.5 - 15.5 %   Platelets 135 (L) 150 - 400 K/uL  Glucose, capillary     Status: Abnormal   Collection Time: 10/02/16  6:03 AM  Result Value Ref Range   Glucose-Capillary 33 (LL) 65 - 99 mg/dL   Comment 1 Capillary Specimen   Glucose, capillary     Status: Abnormal   Collection Time: 10/02/16  6:14 AM  Result Value Ref Range   Glucose-Capillary 202 (H) 65 - 99 mg/dL   Comment 1 Capillary Specimen   Glucose, capillary     Status: None   Collection Time: 10/02/16  8:01 AM  Result Value Ref Range   Glucose-Capillary 88 65 - 99 mg/dL   Comment 1 Capillary Specimen    Comment 2 Notify RN   Glucose, capillary     Status: None   Collection Time: 10/02/16  9:55 AM  Result Value Ref Range   Glucose-Capillary 67 65 - 99 mg/dL  Glucose, capillary     Status: None   Collection Time: 10/02/16 10:40 AM  Result Value Ref Range   Glucose-Capillary 90 65 - 99 mg/dL  Glucose, capillary     Status: None   Collection Time: 10/02/16 11:58 AM  Result Value Ref Range   Glucose-Capillary 93 65 - 99 mg/dL   Comment 1 Capillary Specimen    Comment 2 Notify RN   Glucose, capillary     Status: Abnormal  Collection Time: 10/02/16  4:38 PM  Result Value Ref Range   Glucose-Capillary 160 (H) 65 - 99 mg/dL  Glucose, capillary     Status: Abnormal   Collection Time: 10/02/16  9:52 PM  Result Value Ref Range   Glucose-Capillary 230 (H) 65 - 99 mg/dL   Comment 1 Notify RN    Comment 2 Document in Chart      Recent Results (from the past 240 hour(s))  Surgical pcr screen     Status: None   Collection Time: 09/29/16  9:53 AM  Result Value Ref Range Status   MRSA, PCR NEGATIVE NEGATIVE Final   Staphylococcus aureus NEGATIVE NEGATIVE Final    Comment:        The Xpert SA Assay (FDA approved for NASAL specimens in patients over 78 years of age), is one component of a comprehensive surveillance program.  Test performance has been validated by [facility] for patients greater than or equal to 17 year old. It is not intended to diagnose infection nor to guide or monitor treatment.     Lipid Panel No results for input(s): CHOL, TRIG, HDL, CHOLHDL, VLDL, LDLCALC in the last 72 hours.  Studies/Results: Dg Chest Port 1 View  Result Date: 10/02/2016 CLINICAL DATA:  Status post transcatheter aortic valve replacement. Altered mental status. Code stroke. EXAM: PORTABLE CHEST 1 VIEW COMPARISON:  09/30/2016 FINDINGS: Postoperative aortic valve prosthesis. Cardiac enlargement without vascular congestion. No focal airspace disease or consolidation in the lungs. No blunting of costophrenic angles. No pneumothorax. Right central venous catheter with tip over the cavoatrial junction region. Calcification of the aorta. Vascular stent in the right axilla. IMPRESSION: Cardiac enlargement.  No evidence of active pulmonary disease. Electronically Signed   By: Lucienne Capers M.D.   On: 10/02/2016 03:40   Ct Head Code Stroke Wo Contrast  Result Date: 10/02/2016 CLINICAL DATA:  Code stroke.  Confusion and altered mental status EXAM: CT HEAD WITHOUT CONTRAST TECHNIQUE: Contiguous axial images were obtained from the base of the skull through the vertex without intravenous contrast. COMPARISON:  None. FINDINGS: Brain: No mass lesion, intraparenchymal hemorrhage or extra-axial collection. No evidence of acute cortical infarct. There is periventricular hypoattenuation compatible with chronic microvascular disease.  Vascular: Atherosclerotic calcification of the vertebral and internal carotid arteries at the skull base. Skull: Normal visualized skull base, calvarium and extracranial soft tissues. Sinuses/Orbits: Partial opacification of the sphenoid sinuses. Normal orbits. ASPECTS [facility] Stroke Program Early CT Score) - Ganglionic level infarction (caudate, lentiform nuclei, internal capsule, insula, M1-M3 cortex): 7 - Supraganglionic infarction (M4-M6 cortex): 3 Total score (0-10 with 10 being normal): 10 IMPRESSION: 1. Chronic microvascular ischemia and volume loss without acute intracranial abnormality. 2. ASPECTS is 10. These results were called by telephone at the time of interpretation on 10/02/2016 at 4:00 am to Dr. Kerney Elbe, who verbally acknowledged these results. Electronically Signed   By: Ulyses Jarred M.D.   On: 10/02/2016 04:00    Medications:  Scheduled: . aspirin EC  81 mg Oral Daily  . atorvastatin  20 mg Oral QHS  . calcium acetate  667 mg Oral Q breakfast  . cinacalcet  30 mg Oral Q T,Th,Sa-HD  . cloNIDine  0.1 mg Oral BID  . clopidogrel  75 mg Oral Daily  . darbepoetin (ARANESP) injection - DIALYSIS  60 mcg Intravenous Q Thu-HD  . doxercalciferol  4 mcg Intravenous Q T,Th,Sa-HD  . feeding supplement (PRO-STAT SUGAR FREE 64)  30 mL Oral BID  . hydrALAZINE  50 mg Oral Q8H  . insulin aspart  0-9 Units Subcutaneous TID WC  . metoprolol succinate  100 mg Oral Daily  . multivitamin  1 tablet Oral QHS  . pantoprazole  40 mg Oral Daily  . sodium chloride flush  10-40 mL Intracatheter Q12H  . sodium chloride flush  3 mL Intravenous Q12H    Assessment: 83 year old female POD #3 after TAVR, with acute onset of agitated delirium on 1/11, now resolved.  1. CT head was negative for acute findings.  2. DDx for her transient delirium includes toxic and metabolic etiologies as well as possible benzodiazepine or EtOH withdrawal.  3. Predominantly drowsy and asleep EEG was mildly abnormal due to  slowing of the posterior dominant rhythm. 4. S/P TAVR.  5. Atrial flutter.   Recommendations: 1. CIWA protocol.  2. MRI brain.  3. Continue ASA, Plavix and atorvastatin.  4. Will sign off. Please call Neurology if there are additional questions.    LOS: 4 days   @Electronically  signed: Dr. Kerney Elbe 10/03/2016  12:36 AM

## 2016-10-03 NOTE — Progress Notes (Addendum)
Sliding scale ordered and given. Snack provided to the patient after insulin was given.. Will continue to monitor.   Rishith Siddoway, RN

## 2016-10-03 NOTE — Procedures (Signed)
Patient is on HD, has been getting up with PT OOB and walking in the halls.  No SOB or CP.  Stable on HD now.  For prob dc tomorrow.    I was present at this dialysis session, have reviewed the session itself and made  appropriate changes Kelly Splinter MD [place] pager [phone]   10/03/2016, 1:12 PM

## 2016-10-03 NOTE — Progress Notes (Signed)
4 Days Post-Op Procedure(s) (LRB): TRANSCATHETER AORTIC VALVE REPLACEMENT, TRANSFEMORAL (N/A) TRANSESOPHAGEAL ECHOCARDIOGRAM (TEE) (N/A) Subjective:  No complaints. Had a good day yesterday Currently on HD  Objective: Vital signs in last 24 hours: Temp:  [97.5 F (36.4 C)-98.9 F (37.2 C)] 98.6 F (37 C) (01/13 1217) Pulse Rate:  [65-78] 78 (01/13 1230) Cardiac Rhythm: Normal sinus rhythm (01/13 0800) Resp:  [18-24] 19 (01/13 1230) BP: (108-144)/(50-64) 112/57 (01/13 1230) SpO2:  [86 %-100 %] 98 % (01/13 0955) Weight:  [65.5 kg (144 lb 6.4 oz)-66.5 kg (146 lb 9.7 oz)] 66.5 kg (146 lb 9.7 oz) (01/13 0955)  Hemodynamic parameters for last 24 hours:    Intake/Output from previous day: 01/12 0701 - 01/13 0700 In: 243 [P.O.:240; I.V.:3] Out: 100 [Urine:100] Intake/Output this shift: No intake/output data recorded.  General appearance: alert and cooperative Neurologic: intact Heart: regular rate and rhythm, S1, S2 normal, no murmur, click, rub or gallop Lungs: clear to auscultation bilaterally Wound: groin sites ok  Lab Results:  Recent Labs  10/02/16 0420 10/03/16 1004  WBC 9.0 7.8  HGB 9.7* 10.0*  HCT 28.5* 28.9*  PLT 135* 149*   BMET:  Recent Labs  10/02/16 0420 10/03/16 1004  NA 134* 133*  K 3.4* 4.2  CL 95* 96*  CO2 26 23  GLUCOSE 32* 123*  BUN 21* 42*  CREATININE 5.78* 7.70*  CALCIUM 8.3* 8.0*    PT/INR: No results for input(s): LABPROT, INR in the last 72 hours. ABG    Component Value Date/Time   PHART 7.430 09/29/2016 1428   HCO3 27.6 09/29/2016 1428   TCO2 29 09/29/2016 1428   O2SAT 100.0 09/29/2016 1428   CBG (last 3)   Recent Labs  10/02/16 2152 10/03/16 0820 10/03/16 1215  GLUCAP 230* 83 95    Assessment/Plan: S/P Procedure(s) (LRB): TRANSCATHETER AORTIC VALVE REPLACEMENT, TRANSFEMORAL (N/A) TRANSESOPHAGEAL ECHOCARDIOGRAM (TEE) (N/A)  She is doing well with mental status back to normal after hypoglycemic event two nights  ago.She is eating and walking and feels better.  Plan is to send to 2W later today and probably home tomorrow. She will then return to outpt HD schedule next week on Tuesday.   LOS: 4 days    Gaye Pollack 10/03/2016

## 2016-10-03 NOTE — Progress Notes (Signed)
    Subjective:  The patient feels much better today, she walked this morning without difficulties. Laying flat in bed, denies CP or SOB.   Objective:  Vital Signs in the last 24 hours: Temp:  [96 F (35.6 C)-98.9 F (37.2 C)] 97.5 F (36.4 C) (01/13 0400) Pulse Rate:  [34-77] 74 (01/13 0600) Resp:  [12-24] 19 (01/13 0600) BP: (115-144)/(50-67) 115/52 (01/13 0500) SpO2:  [86 %-100 %] 99 % (01/13 0600) Weight:  [144 lb 6.4 oz (65.5 kg)] 144 lb 6.4 oz (65.5 kg) (01/13 0600)  Intake/Output from previous day: 01/12 0701 - 01/13 0700 In: 243 [P.O.:240; I.V.:3] Out: 100 [Urine:100]  Physical Exam: Pt is alert, oriented, elderly woman in NAD HEENT: normal Neck: JVP - normal Lungs: CTA bilaterally CV: RRR without murmur or gallop Abd: soft, NT Ext: no C/C/E Skin: warm/dry no rash  Lab Results:  Recent Labs  10/01/16 0744 10/02/16 0420  WBC 8.6 9.0  HGB 8.9* 9.7*  PLT 128* 135*    Recent Labs  10/01/16 0745 10/02/16 0420  NA 132* 134*  K 4.2 3.4*  CL 97* 95*  CO2 24 26  GLUCOSE 63* 32*  BUN 28* 21*  CREATININE 8.16* 5.78*   No results for input(s): TROPONINI in the last 72 hours.  Invalid input(s): CK, MB  Tele: Sinus rhythm  TTE: 09/30/2016 - Left ventricle: The cavity size was normal. Systolic function was   normal. The estimated ejection fraction was in the range of 50%   to 55%. There is akinesis of the midinferoseptal myocardium.   Features are consistent with a pseudonormal left ventricular   filling pattern, with concomitant abnormal relaxation and   increased filling pressure (grade 2 diastolic dysfunction). - Aortic valve: A TAVR bioprosthesis was present and functioning   normally. Mean gradient (S): 15 mm Hg. Valve area (Vmean): 1.47   cm^2. - Mitral valve: Moderately calcified annulus. Mobility of the   posterior leaflet was mildly restricted. The findings are   consistent with mild stenosis. There was mild regurgitation.   Valve area by  continuity equation (using LVOT flow): 1.84 cm^2. - Pericardium, extracardiac: A trivial pericardial effusion was   identified.    Assessment/Plan:  1. Acute on chronic diastolic CHF with severe Stage D aortic stenosis: has responded well to TAVR, now POD#3. Volume management with hemodialysis. Continue ASA and plavix.  No AI on yesterday's echo, mean transaortic gradient 15 mmHg.  2. Altered mental status - likely related to hypoglycemia. Improved mental status today.   3. ESRD - appreciate nephrology care.   4. Malignant HTN with renal failure: BP now controlled on combination of clonidine, metoprolol succinate, and hydralazine.  5. Hypokalemia - I would just follow in a patient with ESRD, recheck tomorrow  Transfer to the floor, start physical therapy, if stable, plan for a discharge tomorrow.   Ena Dawley, M.D. 10/03/2016, 8:03 AM

## 2016-10-03 NOTE — Evaluation (Signed)
Physical Therapy Evaluation Patient Details Name: Angela Trujillo MRN: [id number] DOB: 1930/12/24 Today's Date: 10/03/2016   History of Present Illness  83 y.o. female with ESRD secondary to HTN on hemodialysis TTS. PMH also includes DM, HTN, nonobstructive CAD, pulm HTN, atrial flutter and aortic stenosis. She was admitted by cardiology with severe symptomatic aortic stenosis. She reports progressive exertional dyspnea over the past year. She underwent transcatheter aortic valve replacement on 09/29/16.   Clinical Impression  Patient demonstrates deficits in functional mobility as indicated below. Will need continued skilled PT to address deficits and maximize function. Will see as indicated and progress as tolerated. Recommend HHPT and supervision upon initial discharge.    Follow Up Recommendations Home health PT;Supervision/Assistance - 24 hour    Equipment Recommendations  None recommended by PT    Recommendations for Other Services       Precautions / Restrictions Precautions Precautions: Fall Restrictions Weight Bearing Restrictions: No      Mobility  Bed Mobility Overal bed mobility: Needs Assistance Bed Mobility: Supine to Sit     Supine to sit: Supervision     General bed mobility comments: increased time to perform  Transfers Overall transfer level: Needs assistance Equipment used: 1 person hand held assist Transfers: Sit to/from Stand Sit to Stand: Min assist         General transfer comment: Min assist strictly for comfort, HHA provided  Ambulation/Gait Ambulation/Gait assistance: Min assist Ambulation Distance (Feet): 380 Feet Assistive device: 1 person hand held assist Gait Pattern/deviations: Step-through pattern;Decreased stride length;Narrow base of support Gait velocity: decreased Gait velocity interpretation: Below normal speed for age/gender General Gait Details: ambulated while holding therapist hand, no over LOB or instability  noted.  Stairs            Wheelchair Mobility    Modified Rankin (Stroke Patients Only)       Balance Overall balance assessment: Needs assistance Sitting-balance support: Feet supported Sitting balance-Leahy Scale: Good     Standing balance support: Single extremity supported Standing balance-Leahy Scale: Fair                               Pertinent Vitals/Pain Pain Assessment: No/denies pain    Home Living Family/patient expects to be discharged to:: Private residence Living Arrangements: Alone Available Help at Discharge: Family Type of Home: House Home Access: Level entry     Home Layout: One level Home Equipment: Environmental consultant - 2 wheels;Walker - 4 wheels;Tub bench;Cane - single point      Prior Function Level of Independence: Independent with assistive device(s)         Comments: ambulates in the home by reaching out for furniture, ocassional use of cane     Hand Dominance   Dominant Hand: Right    Extremity/Trunk Assessment   Upper Extremity Assessment Upper Extremity Assessment: Generalized weakness    Lower Extremity Assessment Lower Extremity Assessment: Generalized weakness       Communication   Communication: HOH  Cognition Arousal/Alertness: Awake/alert Behavior During Therapy: Flat affect Overall Cognitive Status: No family/caregiver present to determine baseline cognitive functioning                      General Comments      Exercises     Assessment/Plan    PT Assessment Patient needs continued PT services  PT Problem List Decreased strength;Decreased activity tolerance;Decreased balance;Decreased mobility  PT Treatment Interventions DME instruction;Gait training;Functional mobility training;Therapeutic activities;Therapeutic exercise;Patient/family education    PT Goals (Current goals can be found in the Care Plan section)  Acute Rehab PT Goals Patient Stated Goal: to go home PT Goal  Formulation: With patient Time For Goal Achievement: 10/17/16 Potential to Achieve Goals: Good    Frequency Min 3X/week   Barriers to discharge        Co-evaluation               End of Session Equipment Utilized During Treatment: Gait belt Activity Tolerance: Patient tolerated treatment well Patient left: in chair;with call bell/phone within reach Nurse Communication: Mobility status         Time: [id number] PT Time Calculation (min) (ACUTE ONLY): 22 min   Charges:   PT Evaluation $PT Eval Moderate Complexity: 1 Procedure     PT G Codes:        Duncan Dull 10/25/16, 3:20 PM Alben Deeds, [place] DPT  [phone]

## 2016-10-03 NOTE — Progress Notes (Signed)
PT Cancellation Note  Patient Details Name: Angela Trujillo MRN: [id number] DOB: 06-23-1931   Cancelled Treatment:    Reason Eval/Treat Not Completed: Medical issues which prohibited therapy;Patient not medically ready. Will plan to evaluate patient once able. Patient currently on in room CVVHD.   Duncan Dull 10/03/2016, 1:26 PM Alben Deeds, [place] DPT  [phone]

## 2016-10-04 ENCOUNTER — Encounter: Payer: Self-pay | Admitting: Cardiovascular Disease

## 2016-10-04 ENCOUNTER — Encounter (HOSPITAL_COMMUNITY): Payer: Self-pay | Admitting: Physician Assistant

## 2016-10-04 DIAGNOSIS — I5033 Acute on chronic diastolic (congestive) heart failure: Secondary | ICD-10-CM

## 2016-10-04 DIAGNOSIS — E162 Hypoglycemia, unspecified: Secondary | ICD-10-CM

## 2016-10-04 DIAGNOSIS — Z953 Presence of xenogenic heart valve: Secondary | ICD-10-CM

## 2016-10-04 DIAGNOSIS — Z952 Presence of prosthetic heart valve: Secondary | ICD-10-CM

## 2016-10-04 DIAGNOSIS — Z992 Dependence on renal dialysis: Secondary | ICD-10-CM

## 2016-10-04 DIAGNOSIS — N186 End stage renal disease: Secondary | ICD-10-CM

## 2016-10-04 DIAGNOSIS — I5032 Chronic diastolic (congestive) heart failure: Secondary | ICD-10-CM

## 2016-10-04 LAB — GLUCOSE, CAPILLARY
GLUCOSE-CAPILLARY: 86 mg/dL (ref 65–99)
GLUCOSE-CAPILLARY: 180 mg/dL — AB (ref 65–99)

## 2016-10-04 MED ORDER — ASPIRIN 81 MG PO TBEC: 81.0000 mg | DELAYED_RELEASE_TABLET | Freq: Every day | ORAL | Status: DC

## 2016-10-04 MED ORDER — HYDRALAZINE HCL 50 MG PO TABS: 50.0000 mg | ORAL_TABLET | Freq: Three times a day (TID) | ORAL | 5 refills | Status: DC

## 2016-10-04 MED ORDER — CLONIDINE HCL 0.1 MG PO TABS: 0.1000 mg | ORAL_TABLET | Freq: Two times a day (BID) | ORAL | 5 refills | Status: DC

## 2016-10-04 MED ORDER — RENA-VITE PO TABS: 1.0000 | ORAL_TABLET | Freq: Every day | ORAL | 0 refills | Status: DC

## 2016-10-04 MED ORDER — CLOPIDOGREL BISULFATE 75 MG PO TABS: 75.0000 mg | ORAL_TABLET | Freq: Every day | ORAL | 11 refills | Status: DC

## 2016-10-04 MED ORDER — INSULIN DETEMIR 100 UNIT/ML FLEXPEN: 14.0000 [IU] | PEN_INJECTOR | Freq: Every day | SUBCUTANEOUS | 11 refills | Status: DC

## 2016-10-04 NOTE — Progress Notes (Signed)
Patient alert and oriented, denies pain, no c/o shortness, VSS. IJ to R. Neck d/c by iv team per order. D/c instruction explain and given to the pt. Son, all questions answered, family verbalized understanding.Pt. D/c home per order.

## 2016-10-04 NOTE — Discharge Instructions (Signed)
Your Levemir was reduced to once a day because your sugar dropped so low after surgery.  Make sure you follow up with your primary care doctor for your diabetes.  You have an appointment with Dr. Sherren Mocha 10/23/16.  If he wants you seen sooner, our office will arrange this.

## 2016-10-04 NOTE — Discharge Summary (Signed)
Discharge Summary    Patient ID: Angela Trujillo,  MRN: [id number], DOB/AGE: 05/19/1931 83 y.o.  Admit date: 09/29/2016 Discharge date: 10/04/2016  Primary Care Provider: Doretha Sou Primary Cardiologist:  Dr. Sherren Mocha  TCTS: Dr. Cyndia Bent   Discharge Diagnoses    Principal Problem:   Severe aortic stenosis Active Problems:   S/P TAVR (transcatheter aortic valve replacement)   Altered mental status   DM2 (diabetes mellitus, type 2) (HCC)   Hypoglycemia   ESRD (end stage renal disease) (HCC)   Chronic diastolic CHF (congestive heart failure) (HCC)   Allergies Allergies  Allergen Reactions  . Asa [Aspirin] Nausea Only and Other (See Comments)    MAKES STOMACH HURT  . Codeine Rash  . Penicillins Rash     Has patient had a PCN reaction causing immediate rash, facial/tongue/throat swelling, SOB or lightheadedness with hypotension:  #  #  #  NO  #  #  #  Has patient had a PCN reaction causing severe rash involving mucus membranes or skin necrosis:  #  #  #  NO  #  #  # Has patient had a PCN reaction that required hospitalization:  #  #  #  NO  #  #  #  Has patient had a PCN reaction occurring within the last 10 years:  #  #  #  NO  #  #  #  If all answers are "NO", may proceed with Cephalosporin    Diagnostic Studies/Procedures    1. Procedure 09/29/16:       Transcatheter Aortic Valve Replacement - Percutaneous  Transfemoral Approach             Edwards Sapien 3 THV (size 26 mm, model # [id number], serial # [id number])  2. Echo 09/30/16 - Left ventricle: The cavity size was normal. Systolic function was   normal. The estimated ejection fraction was in the range of 50%   to 55%. There is akinesis of the midinferoseptal myocardium.   Features are consistent with a pseudonormal left ventricular   filling pattern, with concomitant abnormal relaxation and   increased filling pressure (grade 2 diastolic dysfunction). - Aortic valve: A TAVR bioprosthesis was present  and functioning   normally. Mean gradient (S): 15 mm Hg. Valve area (Vmean): 1.47   cm^2. - Mitral valve: Moderately calcified annulus. Mobility of the   posterior leaflet was mildly restricted. The findings are   consistent with mild stenosis. There was mild regurgitation.   Valve area by continuity equation (using LVOT flow): 1.84 cm^2. - Pericardium, extracardiac: A trivial pericardial effusion was   identified.  3. EEG 10/02/16 Impression: This predominantly drowsy and asleep EEG is mildly abnormal due to slowing of the posterior dominant rhythm. Clinical Correlation of the above findings indicates mild diffuse cerebral dysfunction that is nonspecific in etiology, and may be seen with hypoxic/ischemic injury, toxic/metabolic encephalopathies, medication effect, or with excessive drowsiness. The absence of epileptiform discharges does not exclude a clinical diagnosis of epilepsy.  Clinical correlation is advised.  _____________   History of Present Illness     Angela Trujillo is a 83 y.o. female with a hx of ESRD on dialysis, aortic stenosis, diastolic CHF, DM, HTN, atrial flutter.  An echocardiogram in 10/17 demonstrated severe aortic stenosis with a mean gradient of 51 mmHg, EF 50-55%, mild to mod MR, mod TR, mod to severe pulmonary HTN.LHC in 11/17 demonstrated minimal non-obstructive CAD and a mean AV gradient of 40 mmHg.  She was seen by both Dr. Sherren Mocha and Dr. Cyndia Bent for evaluation for AVR.  She underwent dental extraction with Dr. Enrique Sack in 1/18.  She was felt to be high risk for SAVR.  Therefore, TAVR was recommended.  She presented to the hospital on 09/29/2016 for the procedure.  Hospital Course     Consultants:  TCTS - Dr. Cyndia Bent Neurology - Dr. Kerney Elbe  Nephrology: Dr. Jonnie Finner  Aortic Stenosis:  She underwent TAVR via transfemoral approach with Dr. Burt Knack and Dr. Cyndia Bent.  Her post-hospital course was c/b mental status changes related to hypoglycemia as outlined  below.  She continued to undergo dialysis with the assistance of Nephrology.  She was followed by Cardiology service as well as TCTS.  Her post op echocardiogram demonstrated a well functioning aortic valve bioprosthesis.  She was evaluated by Physical Therapy and HHPT was recommended.  She continued to progress and was evaluated by Dr. Ena Dawley this AM.  She is felt to be stable for DC to home.   ESRD:  Patient was followed by Nephrology to help with administering dialysis.   Hypertension:  BP was difficult to control and she did require IV NTG to help with BP control for 48 hours post op.  This was eventually weaned off and her BP was well managed with a combination of clonidine, metoprolol, hydralazine.   Delirium:  She developed mental status changes in the early morning hours of 10/02/16 that improved with IV Ativan.  Head CT was neg for acute findings.  She was seen by Neurology.  Clinical picture did not seem c/w acute stroke.  She was noted to by hypoglycemic with CBG of 32 tx with 1 amp D50.  This was felt to be the likely cause of her mental status changes in addition to postop ICU psychosis.  EEG was mildly abnormal without significant findings.  Her mental status returned to baseline.  Neurology had no further recommendations and signed off 10/03/16.   Diabetes:  As noted, she had issues with hypoglycemia resulting in mental status changes.  Levemir was placed on hold.  She was followed by the diabetes coordinator.  She was on Levemir 14 units bid.  It was suggested to start 50% of home dose Levemir when she is ready to resume her basal insulin.  Therefore, at DC, we will resume Levemir 14 units QD.    Chronic Diastolic CHF:  Volume managed by hemodialysis.   _____________   Discharge Vitals Blood pressure (!) 126/51, pulse 71, temperature 98.7 F (37.1 C), temperature source Oral, resp. rate 20, height 5\' 5"  (1.651 m), weight 142 lb 6.7 oz (64.6 kg), SpO2 100 %.  Filed Weights    10/03/16 1331 10/03/16 1711 10/04/16 0645  Weight: 143 lb 15.4 oz (65.3 kg) 141 lb 3.2 oz (64 kg) 142 lb 6.7 oz (64.6 kg)    Labs & Radiologic Studies    CBC  Recent Labs  10/02/16 0420 10/03/16 1004  WBC 9.0 7.8  HGB 9.7* 10.0*  HCT 28.5* 28.9*  MCV 90.8 90.3  PLT 135* 123456*   Basic Metabolic Panel  Recent Labs  10/02/16 0420 10/03/16 1004  NA 134* 133*  K 3.4* 4.2  CL 95* 96*  CO2 26 23  GLUCOSE 32* 123*  BUN 21* 42*  CREATININE 5.78* 7.70*  CALCIUM 8.3* 8.0*  PHOS  --  5.1*   Liver Function Tests  Recent Labs  10/02/16 0420 10/03/16 1004  AST 21  --   ALT  12*  --   ALKPHOS 114  --   BILITOT 0.6  --   PROT 6.0*  --   ALBUMIN 2.5* 2.5*   No results for input(s): LIPASE, AMYLASE in the last 72 hours. Cardiac Enzymes No results for input(s): CKTOTAL, CKMB, CKMBINDEX, TROPONINI in the last 72 hours. BNP Invalid input(s): POCBNP D-Dimer No results for input(s): DDIMER in the last 72 hours. Hemoglobin A1C No results for input(s): HGBA1C in the last 72 hours. Fasting Lipid Panel No results for input(s): CHOL, HDL, LDLCALC, TRIG, CHOLHDL, LDLDIRECT in the last 72 hours. Thyroid Function Tests No results for input(s): TSH, T4TOTAL, T3FREE, THYROIDAB in the last 72 hours.  Invalid input(s): FREET3 _____________    Dg Chest Port 1 View  Result Date: 10/02/2016 IMPRESSION: Cardiac enlargement.  No evidence of active pulmonary disease. Electronically Signed   By: Lucienne Capers M.D.   On: 10/02/2016 03:40   Dg Chest Port 1 View  Result Date: 09/30/2016 IMPRESSION: 1. Stable cardiomegaly. Cannot exclude mild pulmonary vascular congestion. 2. Right IJ central venous line catheter tip overlies the lower SVC. 3. Aortic valve replacement is noted. . Electronically Signed   By: Ivar Drape M.D.   On: 09/30/2016 08:33   Dg Chest Port 1 View  Result Date: 09/29/2016 IMPRESSION: No postprocedure complication observed following transcatheter aortic valve replacement.  Mild stable cardiomegaly with minimal pulmonary vascular congestion. Thoracic aortic atherosclerosis. Electronically Signed   By: David  Martinique M.D.   On: 09/29/2016 14:53   Ct Head Code Stroke Wo Contrast  Result Date: 10/02/2016 IMPRESSION: 1. Chronic microvascular ischemia and volume loss without acute intracranial abnormality. 2. ASPECTS is 10. These results were called by telephone at the time of interpretation on 10/02/2016 at 4:00 am to Dr. Kerney Elbe, who verbally acknowledged these results. Electronically Signed   By: Ulyses Jarred M.D.   On: 10/02/2016 04:00    Disposition   Pt is being discharged home today in good condition.  Follow-up Plans & Appointments    Follow-up Information    Sherren Mocha, MD Follow up on 10/23/2016.   Specialty:  Cardiology Why:  The office will call if this appointment needs to be made earlier. Contact information: [id number] [place] [address] [phone]        Doretha Sou, MD Follow up.   Specialty:  Internal Medicine Why:  Follow up on your diabetes.  We decreased your insulin because your sugar dropped after surgery. Contact information: [address] [place] [address] [phone]          Discharge Instructions    Call MD for:  redness, tenderness, or signs of infection (pain, swelling, redness, odor or green/yellow discharge around incision site)    Complete by:  As directed    Call MD for:  severe uncontrolled pain    Complete by:  As directed    Call MD for:  temperature >100.4    Complete by:  As directed    Diet - low sodium heart healthy    Complete by:  As directed    Discharge wound care:    Complete by:  As directed    Call for any swelling, bleeding, bruising or fever.   Driving Restrictions    Complete by:  As directed    No driving until seen in follow up.   Increase activity slowly    Complete by:  As directed    Lifting restrictions    Complete  by:  As  directed    No lifting 5 lbs or heavier.      Discharge Medications   Current Discharge Medication List    START taking these medications   Details  aspirin EC 81 MG EC tablet Take 1 tablet (81 mg total) by mouth daily.    cloNIDine (CATAPRES) 0.1 MG tablet Take 1 tablet (0.1 mg total) by mouth 2 (two) times daily. Qty: 60 tablet, Refills: 5    clopidogrel (PLAVIX) 75 MG tablet Take 1 tablet (75 mg total) by mouth daily. Qty: 30 tablet, Refills: 11    hydrALAZINE (APRESOLINE) 50 MG tablet Take 1 tablet (50 mg total) by mouth 3 (three) times daily. Qty: 90 tablet, Refills: 5    multivitamin (RENA-VIT) TABS tablet Take 1 tablet by mouth at bedtime. Refills: 0      CONTINUE these medications which have CHANGED   Details  Insulin Detemir (LEVEMIR FLEXTOUCH) 100 UNIT/ML Pen Inject 14 Units into the skin daily at 10 pm. Qty: 15 mL, Refills: 11      CONTINUE these medications which have NOT CHANGED   Details  atorvastatin (LIPITOR) 20 MG tablet Take 20 mg by mouth at bedtime.    calcium acetate (PHOSLO) 667 MG capsule Take 667 mg by mouth daily with breakfast.    chlorhexidine (PERIDEX) 0.12 % solution Rinse with 15 mls twice daily for 30 seconds. Use after breakfast and at bedtime. Spit out excess. Do not swallow. Qty: 480 mL, Refills: prn    famotidine (PEPCID) 20 MG tablet Take 20 mg by mouth 2 (two) times daily.    HYDROcodone-acetaminophen (HYCET) 7.5-325 mg/15 ml solution Take 10-15 mLs by mouth every 6 (six) hours as needed for moderate pain or severe pain. Qty: 120 mL, Refills: 0    metoprolol succinate (TOPROL-XL) 100 MG 24 hr tablet Take 100 mg by mouth daily.    SENSIPAR 30 MG tablet Take 30 mg by mouth Every Tuesday,Thursday,and Saturday with dialysis.          Outstanding Labs/Studies   None   Duration of Discharge Encounter   Greater than 30 minutes including physician time.  Signed, Richardson Dopp, PA-C  10/04/2016, 2:05 PM

## 2016-10-04 NOTE — Progress Notes (Signed)
Physical Therapy Treatment Patient Details Name: Angela Trujillo MRN: [id number] DOB: 17-Aug-1931 Today's Date: 10/04/2016    History of Present Illness 83 y.o. female with ESRD secondary to HTN on hemodialysis TTS. PMH also includes DM, HTN, nonobstructive CAD, pulm HTN, atrial flutter and aortic stenosis. She was admitted by cardiology with severe symptomatic aortic stenosis. She reports progressive exertional dyspnea over the past year. She underwent transcatheter aortic valve replacement on 09/29/16.     PT Comments    Patient seen for mobility progression in preparation for d/c home. Mobilizing well, less instability today. No physical assist required. Anticipate patient will be safe for d/c home.   Follow Up Recommendations  Home health PT;Supervision/Assistance - 24 hour     Equipment Recommendations  None recommended by PT    Recommendations for Other Services       Precautions / Restrictions Precautions Precautions: Fall Restrictions Weight Bearing Restrictions: No    Mobility  Bed Mobility Overal bed mobility: Needs Assistance Bed Mobility: Supine to Sit     Supine to sit: Supervision     General bed mobility comments: increased time to perform  Transfers Overall transfer level: Needs assistance Equipment used: None Transfers: Sit to/from Stand Sit to Stand: Supervision         General transfer comment: improved stability noted today  Ambulation/Gait Ambulation/Gait assistance: Supervision Ambulation Distance (Feet): 240 Feet Assistive device: None Gait Pattern/deviations: Step-through pattern;Decreased stride length;Narrow base of support Gait velocity: decreased   General Gait Details: modest instability, improved activity tolerance overall today   Stairs            Wheelchair Mobility    Modified Rankin (Stroke Patients Only)       Balance Overall balance assessment: Needs assistance Sitting-balance support: Feet supported Sitting  balance-Leahy Scale: Good     Standing balance support: No upper extremity supported Standing balance-Leahy Scale: Fair                      Cognition Arousal/Alertness: Awake/alert Behavior During Therapy: WFL for tasks assessed/performed Overall Cognitive Status: Within Functional Limits for tasks assessed                      Exercises      General Comments        Pertinent Vitals/Pain Pain Assessment: No/denies pain    Home Living                      Prior Function            PT Goals (current goals can now be found in the care plan section) Acute Rehab PT Goals Patient Stated Goal: to go home PT Goal Formulation: With patient Time For Goal Achievement: 10/17/16 Potential to Achieve Goals: Good Progress towards PT goals: Progressing toward goals    Frequency    Min 3X/week      PT Plan Current plan remains appropriate    Co-evaluation             End of Session Equipment Utilized During Treatment: Gait belt Activity Tolerance: Patient tolerated treatment well Patient left: in bed;with call bell/phone within reach;with family/visitor present     Time: [id number] PT Time Calculation (min) (ACUTE ONLY): 14 min  Charges:  $Gait Training: 8-22 mins                    G Codes:      [employer]  Harmon Bommarito 10/04/2016, 4:39 PM Alben Deeds, [place] DPT  [phone]

## 2016-10-04 NOTE — Progress Notes (Signed)
[facility] Progress Note    Assessment/Plan: 1. Aortic stenosis s/p TAVR on 1/9 - per admit 2. AMS - due to low BS's, resolved 3.  ESRD -  TTS HD 4.  Hypertension/volume  - BP controlled on metoprolol, just over dry wt 5.  Anemia  - Hgb 9.7 cont ESA -  Aranesp 60 mcg IV on 1/11 6.  Metabolic bone disease -  Cont VDRA/Ca acetate binder  7.  Nutrition - renal diet/vitamins/ protein supp  8. DM - per primary  9. Dispo - stable from renal standpoint    Kelly Splinter MD [place] pager [phone]   10/04/2016, 8:53 AM    Subjective:  Slept good, no SOB or CP, no fevers. "I might get to go home today".   Objective Vitals:   10/03/16 2222 10/04/16 0159 10/04/16 0609 10/04/16 0645  BP: 124/61 (!) 114/48 (!) 126/56   Pulse: 78 78 76   Resp: 16  18   Temp: 98.6 F (37 C)  98.5 F (36.9 C)   TempSrc: Oral  Oral   SpO2: 100% 98% 100%   Weight:    64.6 kg (142 lb 6.7 oz)  Height:       Physical Exam General: WNWD Elderly female, pleasant A&O x3 Heart: RRR 2/6 systolic murmur Lungs: CTAB Abdomen: soft BS+ Extremities: no LE edema  Dialysis Access: RUE AVG +thrill   Dialysis Orders:  [facility]  TTS 3.5 hours BFR 350/700 F180 2K/2Ca  EDW 63.5 kg  RUE AVG #16 needles Heparin Bolus 2600 U IV bolus  Aranesp 25 mcg IV q TH Hectorol 4 mcg IV q HD  Additional Objective Labs: Basic Metabolic Panel:  Recent Labs Lab 10/01/16 0745 10/02/16 0420 10/03/16 1004  NA 132* 134* 133*  K 4.2 3.4* 4.2  CL 97* 95* 96*  CO2 24 26 23   GLUCOSE 63* 32* 123*  BUN 28* 21* 42*  CREATININE 8.16* 5.78* 7.70*  CALCIUM 8.5* 8.3* 8.0*  PHOS 5.0*  --  5.1*   Liver Function Tests:  Recent Labs Lab 10/01/16 0745 10/02/16 0420 10/03/16 1004  AST  --  21  --   ALT  --  12*  --   ALKPHOS  --  114  --   BILITOT  --  0.6  --   PROT  --  6.0*  --   ALBUMIN 2.5* 2.5* 2.5*   No results for input(s): LIPASE, AMYLASE in the last 168  hours. CBC:  Recent Labs Lab 09/29/16 1427  09/30/16 0330 10/01/16 0744 10/02/16 0420 10/03/16 1004  WBC 7.2  --  8.6 8.6 9.0 7.8  HGB 9.9*  < > 9.1* 8.9* 9.7* 10.0*  HCT 29.7*  < > 26.7* 25.6* 28.5* 28.9*  MCV 91.4  --  91.1 89.8 90.8 90.3  PLT 132*  --  132* 128* 135* 149*  < > = values in this interval not displayed. Blood Culture No results found for: SDES, SPECREQUEST, CULT, REPTSTATUS  Cardiac Enzymes: No results for input(s): CKTOTAL, CKMB, CKMBINDEX, TROPONINI in the last 168 hours. CBG:  Recent Labs Lab 10/03/16 0820 10/03/16 1215 10/03/16 1709 10/03/16 2221 10/04/16 0607  GLUCAP 83 95 314* 324* 86   Iron Studies: No results for input(s): IRON, TIBC, TRANSFERRIN, FERRITIN in the last 72 hours. Lab Results  Component Value Date   INR 1.21 09/29/2016   INR 1.13 09/25/2016   INR 1.09 08/19/2016   Medications:  . aspirin EC  81 mg  Oral Daily  . atorvastatin  20 mg Oral QHS  . calcium acetate  667 mg Oral Q breakfast  . cinacalcet  30 mg Oral Q T,Th,Sa-HD  . cloNIDine  0.1 mg Oral BID  . clopidogrel  75 mg Oral Daily  . darbepoetin (ARANESP) injection - DIALYSIS  60 mcg Intravenous Q Thu-HD  . doxercalciferol  4 mcg Intravenous Q T,Th,Sa-HD  . feeding supplement (PRO-STAT SUGAR FREE 64)  30 mL Oral BID  . hydrALAZINE  50 mg Oral Q8H  . insulin aspart  0-9 Units Subcutaneous TID WC & HS  . metoprolol succinate  100 mg Oral Daily  . multivitamin  1 tablet Oral QHS  . pantoprazole  40 mg Oral Daily  . sodium chloride flush  10-40 mL Intracatheter Q12H  . sodium chloride flush  3 mL Intravenous Q12H

## 2016-10-04 NOTE — Progress Notes (Signed)
Patient with no complaints or concerns during 7pm - 7am shift.  Serenity Batley, RN 

## 2016-10-04 NOTE — Progress Notes (Signed)
Subjective:  The patient feels much better today, she would like to go home.   Marland Kitchen aspirin EC  81 mg Oral Daily  . atorvastatin  20 mg Oral QHS  . calcium acetate  667 mg Oral Q breakfast  . cinacalcet  30 mg Oral Q T,Th,Sa-HD  . cloNIDine  0.1 mg Oral BID  . clopidogrel  75 mg Oral Daily  . darbepoetin (ARANESP) injection - DIALYSIS  60 mcg Intravenous Q Thu-HD  . doxercalciferol  4 mcg Intravenous Q T,Th,Sa-HD  . feeding supplement (PRO-STAT SUGAR FREE 64)  30 mL Oral BID  . hydrALAZINE  50 mg Oral Q8H  . insulin aspart  0-9 Units Subcutaneous TID WC & HS  . metoprolol succinate  100 mg Oral Daily  . multivitamin  1 tablet Oral QHS  . pantoprazole  40 mg Oral Daily  . sodium chloride flush  10-40 mL Intracatheter Q12H  . sodium chloride flush  3 mL Intravenous Q12H   Objective:  Vital Signs in the last 24 hours: Temp:  [97.9 F (36.6 C)-98.6 F (37 C)] 98.4 F (36.9 C) (01/14 1029) Pulse Rate:  [75-88] 76 (01/14 0609) Resp:  [16-22] 18 (01/14 0609) BP: (110-131)/(47-79) 131/57 (01/14 1029) SpO2:  [98 %-100 %] 100 % (01/14 0609) Weight:  [141 lb 3.2 oz (64 kg)-143 lb 15.4 oz (65.3 kg)] 142 lb 6.7 oz (64.6 kg) (01/14 0645)  Intake/Output from previous day: 01/13 0701 - 01/14 0700 In: 130 [P.O.:130] Out: 2000   Physical Exam: Pt is alert, oriented, elderly woman in NAD HEENT: normal Neck: JVP - normal Lungs: CTA bilaterally CV: RRR without murmur or gallop Abd: soft, NT Ext: no C/C/E Skin: warm/dry no rash  Lab Results:  Recent Labs  10/02/16 0420 10/03/16 1004  WBC 9.0 7.8  HGB 9.7* 10.0*  PLT 135* 149*    Recent Labs  10/02/16 0420 10/03/16 1004  NA 134* 133*  K 3.4* 4.2  CL 95* 96*  CO2 26 23  GLUCOSE 32* 123*  BUN 21* 42*  CREATININE 5.78* 7.70*   Tele: Sinus rhythm  TTE: 09/30/2016 - Left ventricle: The cavity size was normal. Systolic function was   normal. The estimated ejection fraction was in the range of 50%   to 55%. There is  akinesis of the midinferoseptal myocardium.   Features are consistent with a pseudonormal left ventricular   filling pattern, with concomitant abnormal relaxation and   increased filling pressure (grade 2 diastolic dysfunction). - Aortic valve: A TAVR bioprosthesis was present and functioning   normally. Mean gradient (S): 15 mm Hg. Valve area (Vmean): 1.47   cm^2. - Mitral valve: Moderately calcified annulus. Mobility of the   posterior leaflet was mildly restricted. The findings are   consistent with mild stenosis. There was mild regurgitation.   Valve area by continuity equation (using LVOT flow): 1.84 cm^2. - Pericardium, extracardiac: A trivial pericardial effusion was   identified.    Assessment/Plan:  1. Acute on chronic diastolic CHF with severe Stage D aortic stenosis: has responded well to TAVR, now POD#3. Volume management with hemodialysis. Continue ASA and plavix.  No AI on yesterday's echo, mean transaortic gradient 15 mmHg.  2. Altered mental status - related to hypoglycemia. Resolved.  3. ESRD - appreciate nephrology care. Stable from their standpoint.   4. Malignant HTN with renal failure: BP now controlled on combination of clonidine, metoprolol succinate, and hydralazine. Now controlled.  5. Hypokalemia - I would just follow in  a patient with ESRD, recheck tomorrow  Discharge today, follow up with Dr Burt Knack.   Ena Dawley, M.D. 10/04/2016, 12:13 PM

## 2016-10-05 ENCOUNTER — Telehealth: Payer: Self-pay | Admitting: *Deleted

## 2016-10-05 NOTE — Telephone Encounter (Signed)
Thayer Headings, pt's daughter, states pt is asleep, I left a message for pt to call our office to follow up on hospitalization.

## 2016-10-05 NOTE — Telephone Encounter (Signed)
Hospital notes indicate patient was discharged 10/04/16

## 2016-10-05 NOTE — Telephone Encounter (Signed)
-----   Message from Liliane Shi, [place] sent at 10/04/2016  1:44 PM EST ----- Regarding: Post TAVR follow up appt Patient:  Angela Trujillo [id number]  Primary Cardiologist:  Dr. Sherren Mocha  DC from hospital on 10/04/2016  Please arrange FU in 1-2 weeks with Dr. Sherren Mocha or PA on day Dr. Sherren Mocha in office. This is a TCM appointment. Signed,  Richardson Dopp, PA-C   10/04/2016 1:44 PM

## 2016-10-06 ENCOUNTER — Telehealth: Payer: Self-pay | Admitting: *Deleted

## 2016-10-06 NOTE — Telephone Encounter (Signed)
Patient contacted regarding discharge from [facility] on 10/04/16.  Patient understands to follow up with provider Dr. Burt Knack on 10/23/16 at 4:00 pm at [address], [place], [place] [address] . Patient understands discharge instructions? Yes  Patient understands medications and regiment? Yes  Patient understands to bring all medications to this visit? Yes

## 2016-10-06 NOTE — Telephone Encounter (Signed)
Lmtcb per Brynda Rim. PA and Dr. Burt Knack pt needs appt in the next 7 days as well as weith Dr. Burt Knack 10/23/16. Dr. Burt Knack appt has been made already. I am calling pt to schedule her an appt with Brynda Rim. PA

## 2016-10-08 MED FILL — Phenylephrine HCl Inj 10 MG/ML: INTRAMUSCULAR | Qty: 2 | Status: AC

## 2016-10-08 MED FILL — Insulin Regular (Human) Inj 100 Unit/ML: INTRAMUSCULAR | Qty: 250 | Status: AC

## 2016-10-08 MED FILL — Dextrose Inj 5%: INTRAVENOUS | Qty: 250 | Status: AC

## 2016-10-09 NOTE — Telephone Encounter (Signed)
Liliane Shi, PA-C  Barkley Boards, RN        Ronalee Belts wanted me to see her 7-14 days after DC and keep appt with him 2/2. Can we arrange?  Thanks,  Nicki Reaper    I have gone ahead and put the pt on Scott's schedule for 10/16/16 at 8:45 AM for TOC visit.  I left a message at the pt's home number to contact the office in regards to this appointment.

## 2016-10-13 NOTE — Telephone Encounter (Signed)
Lmtcb to confirm with the pt that she did receive phone message from both Angela Quay, RN and myself in regards to an appt scheduled for 10/16/16 @ 8:45 per Dr. Burt Knack and Brynda Rim. PA. Asked for call back to confirm appt.

## 2016-10-15 NOTE — Progress Notes (Signed)
Cardiology Office Note:    Date:  10/16/2016   ID:  Taryne Kiger, DOB 24-Oct-1930, MRN [id number]  PCP:  Doretha Sou, MD  Cardiologist:  Dr. Sherren Mocha   Electrophysiologist:  n/a  Referring MD: Doretha Sou*   Chief Complaint  Patient presents with  . Hospitalization Follow-up    s/p TAVR - Transitional Care Follow Up    History of Present Illness:    Angela Trujillo is a 83 y.o. female with a hx of  ESRD on dialysis, aortic stenosis, diastolic CHF, DM, HTN, atrial flutter.  An echocardiogram in 10/17 demonstrated severe aortic stenosis with a mean gradient of 51 mmHg, EF 50-55%, mild to mod MR, mod TR, mod to severe pulmonary HTN.  LHC in 11/17 demonstrated minimal non-obstructive CAD and a mean AV gradient of 40 mmHg.  She was seen by both Dr. Sherren Mocha and Dr. Cyndia Bent for evaluation for AVR.  She underwent dental extraction with Dr. Enrique Sack in 1/18.  She was felt to be high risk for SAVR.  Therefore, TAVR was recommended.  She was admitted 1/9-1/14 and underwent TAVR via transfemoral approach.  Her postoperative course was complicated by mental status changes related to hypoglycemia. She was seen by neurology. No significant neurologic issues were identified. Postoperative echocardiogram demonstrated well-functioning aortic valve prosthesis. Her insulin dosage was adjusted at discharge.  She returns for posthospitalization follow-up.  She is here with her family.  She is still somewhat weak.  Her sugars do go down somewhat at times.  She has not yet had follow up with her PCP.  She denies syncope, bleeding, edema.  She is not exercising much.  She still notes some dyspnea with activities but is overall feeling better.  Her BPs at home have been optimal for the most part.    Prior CV studies that were reviewed today include:    Echocardiogram 09/30/16 EF 50-55, inferoseptal akinesis, grade 2 diastolic dysfunction, TAVR bioprosthesis functioning normally  with mean gradient 15 mmHg, moderate MAC, mild mitral stenosis, mild MR, trivial pericardial effusion  R/L HC 11/17 Luminal irregs EF 55 Peak to peak gradient 46 mmHg; AVA 0.97 PASP 69 mmHg  Past Medical History:  Diagnosis Date  . Atrial flutter ([place])   . Diabetes mellitus with end stage renal disease ([place])   . Dyspnea    with exertion  . ESRD (end stage renal disease) on dialysis ([place])    HD on T,T, Sa  . GERD (gastroesophageal reflux disease)   . Hypertension   . Pneumonia 10/2015  . Severe aortic stenosis 08/19/2016   s/p TAVR 09/2016 // b. Echo 09/30/16: EF 50-55, inf-septal AK, Gr 2 DD, normally functioning TAVR, mean AV gradient 15 mmHg, mod MAC, mild MS, mild MR, trivial effusion    Past Surgical History:  Procedure Laterality Date  . ABDOMINAL HYSTERECTOMY    . CARDIAC CATHETERIZATION N/A 08/19/2016   Procedure: Right/Left Heart Cath and Coronary Angiography;  Surgeon: Sherren Mocha, MD;  Location: [facility];  Service: Cardiovascular;  Laterality: N/A;  . COLONOSCOPY    . IR GENERIC HISTORICAL  08/31/2016   IR RADIOLOGY PERIPHERAL GUIDED IV START 08/31/2016 Corrie Mckusick, DO [facility]  . IR GENERIC HISTORICAL  08/31/2016   IR US GUIDE VASC ACCESS RIGHT 08/31/2016 Corrie Mckusick, DO [facility]  . MULTIPLE EXTRACTIONS WITH ALVEOLOPLASTY N/A 09/25/2016   Procedure: EXTRACTION of tooth number 31 WITH ALVEOLOPLASTY AND GROSS DEBRIDEMENT OF REMAINING TEETH;  Surgeon: Lenn Cal, DDS;  [facility]  OR;  Service: Oral Surgery;  Laterality: N/A;  . TEE WITHOUT CARDIOVERSION N/A 09/29/2016   Procedure: TRANSESOPHAGEAL ECHOCARDIOGRAM (TEE);  Surgeon: Sherren Mocha, MD;  Location: [facility];  Service: Open Heart Surgery;  Laterality: N/A;  . TRANSCATHETER AORTIC VALVE REPLACEMENT, TRANSFEMORAL N/A 09/29/2016   Procedure: TRANSCATHETER AORTIC VALVE REPLACEMENT, TRANSFEMORAL;  Surgeon: Sherren Mocha, MD;  Location: [facility];  Service: Open Heart Surgery;  Laterality: N/A;  .  TUBAL LIGATION      Current Medications: Current Meds  Medication Sig  . aspirin EC 81 MG EC tablet Take 1 tablet (81 mg total) by mouth daily.  Marland Kitchen atorvastatin (LIPITOR) 20 MG tablet Take 20 mg by mouth at bedtime.  . calcium acetate (PHOSLO) 667 MG capsule Take 667 mg by mouth daily with breakfast.  . chlorhexidine (PERIDEX) 0.12 % solution Rinse with 15 mls twice daily for 30 seconds. Use after breakfast and at bedtime. Spit out excess. Do not swallow.  . cloNIDine (CATAPRES) 0.1 MG tablet Take 1 tablet (0.1 mg total) by mouth 2 (two) times daily.  . clopidogrel (PLAVIX) 75 MG tablet Take 1 tablet (75 mg total) by mouth daily.  . famotidine (PEPCID) 20 MG tablet Take 20 mg by mouth 2 (two) times daily.  . hydrALAZINE (APRESOLINE) 50 MG tablet Take 1 tablet (50 mg total) by mouth 3 (three) times daily.  Marland Kitchen HYDROcodone-acetaminophen (HYCET) 7.5-325 mg/15 ml solution Take 10-15 mLs by mouth every 6 (six) hours as needed for moderate pain or severe pain.  . Insulin Detemir (LEVEMIR FLEXTOUCH) 100 UNIT/ML Pen Inject 14 Units into the skin daily at 10 pm.  . metoprolol succinate (TOPROL-XL) 100 MG 24 hr tablet Take 100 mg by mouth daily.  . Multiple Vitamins-Minerals (MULTIVITAMIN PO) Take 1 tablet by mouth daily.  . multivitamin (RENA-VIT) TABS tablet Take 1 tablet by mouth at bedtime.  . SENSIPAR 30 MG tablet Take 30 mg by mouth Every Tuesday,Thursday,and Saturday with dialysis.      Allergies:   Asa [aspirin]; Codeine; and Penicillins   Social History   Social History  . Marital status: Widowed    Spouse name: N/A  . Number of children: 5  . Years of education: N/A   Social History Main Topics  . Smoking status: Never Smoker  . Smokeless tobacco: Never Used  . Alcohol use No  . Drug use: No  . Sexual activity: No   Other Topics Concern  . None   Social History Narrative  . None     Family History:  The patient's family history is not on file. No CAD  ROS:   Please see  the history of present illness.    ROS All other systems reviewed and are negative.   EKGs/Labs/Other Test Reviewed:    EKG:  EKG is  ordered today.  The ekg ordered today demonstrates NSR, HR 72, first-degree AV block, PR 280 ms, RBBB, no significant change since prior tracing  Recent Labs: 12/14/2015: TSH 1.491 09/30/2016: Magnesium 1.7 10/02/2016: ALT 12 10/03/2016: BUN 42; Creatinine, Ser 7.70; Hemoglobin 10.0; Platelets 149; Potassium 4.2; Sodium 133   Recent Lipid Panel    Component Value Date/Time   CHOL 181 04/15/2013 1120   TRIG 91 04/15/2013 1120   HDL 62 04/15/2013 1120   CHOLHDL 2.9 04/15/2013 1120   VLDL 18 04/15/2013 1120   LDLCALC 101 (H) 04/15/2013 1120     Physical Exam:    VS:  BP (!) 176/72 (BP Location: Left Arm)   Pulse  72   Ht 5' 5"  (1.651 m)   Wt 147 lb 6.4 oz (66.9 kg)   BMI 24.53 kg/m     Wt Readings from Last 3 Encounters:  10/16/16 147 lb 6.4 oz (66.9 kg)  10/04/16 142 lb 6.7 oz (64.6 kg)  09/25/16 144 lb (65.3 kg)     Physical Exam  Constitutional: She is oriented to person, place, and time. She appears well-developed and well-nourished. No distress.  HENT:  Head: Normocephalic and atraumatic.  Eyes: No scleral icterus.  Neck: No JVD present.  Cardiovascular: Normal rate, regular rhythm, S1 normal and S2 normal.   Murmur heard.  Medium-pitched systolic murmur is present with a grade of 2/6  at the upper right sternal border Pulmonary/Chest: Effort normal. She has no wheezes. She has no rales.  Abdominal: There is no tenderness.  Musculoskeletal: She exhibits edema.  Trace ankle edema bilaterally; R groin site without hematoma or bruit  Neurological: She is alert and oriented to person, place, and time.  Skin: Skin is warm and dry.  Psychiatric: She has a normal mood and affect.    ASSESSMENT:    1. S/P TAVR (transcatheter aortic valve replacement)   2. Chronic diastolic CHF (congestive heart failure) ([place])   3. Essential  hypertension   4. ESRD (end stage renal disease) ([place])   5. Type 2 diabetes mellitus with complication, with long-term current use of insulin ([facility])    PLAN:    In order of problems listed above:  1. S/p TAVR - She is progressing slowly after recent TAVR. I have encouraged her to increase her activity more. She is eating well. She is tolerating aspirin and Plavix. Transfemoral site is well healed.  She has follow-up in the next 1-2 weeks with Dr. Burt Knack. She will keep this appointment.  2. Chronic diastolic CHF - Volume is managed by hemodialysis.  3. HTN - Blood pressure is elevated today. However, blood pressures at home have been fairly optimal. I have encouraged her to continue to monitor blood pressures. If the readings continue to run high like today, she will need further adjustments.  4. ESRD - She remains on Tuesday, Thursday, Saturday hemodialysis.   5. DM2 - I have encouraged her to follow-up with primary care for further adjustments in her insulin given episodes of hypoglycemia.   Medication Adjustments/Labs and Tests Ordered: Current medicines are reviewed at length with the patient today.  Concerns regarding medicines are outlined above.  Medication changes, Labs and Tests ordered today are outlined in the Patient Instructions noted below. Patient Instructions  Medication Instructions:  Your physician recommends that you continue on your current medications as directed. Please refer to the Current Medication list given to you today.  Labwork: NONE  Testing/Procedures: NONE  Follow-Up: KEEP YOUR UPCOMING APPT WITH DR. Burt Knack 10/23/16 @ 4 PM  Any Other Special Instructions Will Be Listed Below (If Applicable).  If you need a refill on your cardiac medications before your next appointment, please call your pharmacy.  Signed, Richardson Dopp, PA-C  10/16/2016 9:34 AM    [facility] [address] [place], [place], [place]  [address] Phone: [phone];  Fax: [phone]

## 2016-10-16 ENCOUNTER — Ambulatory Visit (INDEPENDENT_AMBULATORY_CARE_PROVIDER_SITE_OTHER): Payer: Medicare Other | Admitting: Physician Assistant

## 2016-10-16 ENCOUNTER — Encounter: Payer: Self-pay | Admitting: Physician Assistant

## 2016-10-16 VITALS — BP 176/72 | HR 72 | Ht 65.0 in | Wt 147.4 lb

## 2016-10-16 DIAGNOSIS — Z952 Presence of prosthetic heart valve: Secondary | ICD-10-CM

## 2016-10-16 DIAGNOSIS — E118 Type 2 diabetes mellitus with unspecified complications: Secondary | ICD-10-CM | POA: Diagnosis not present

## 2016-10-16 DIAGNOSIS — I5032 Chronic diastolic (congestive) heart failure: Secondary | ICD-10-CM

## 2016-10-16 DIAGNOSIS — Z794 Long term (current) use of insulin: Secondary | ICD-10-CM | POA: Diagnosis not present

## 2016-10-16 DIAGNOSIS — N186 End stage renal disease: Secondary | ICD-10-CM | POA: Diagnosis not present

## 2016-10-16 DIAGNOSIS — I10 Essential (primary) hypertension: Secondary | ICD-10-CM | POA: Diagnosis not present

## 2016-10-16 NOTE — Patient Instructions (Addendum)
Medication Instructions:  Your physician recommends that you continue on your current medications as directed. Please refer to the Current Medication list given to you today.  Labwork: NONE  Testing/Procedures: NONE  Follow-Up: KEEP YOUR UPCOMING APPT WITH DR. Burt Knack 10/23/16 @ 4 PM  Any Other Special Instructions Will Be Listed Below (If Applicable).  If you need a refill on your cardiac medications before your next appointment, please call your pharmacy.

## 2016-10-20 ENCOUNTER — Telehealth: Payer: Self-pay | Admitting: Cardiovascular Disease

## 2016-10-20 ENCOUNTER — Emergency Department (HOSPITAL_COMMUNITY)
Admission: EM | Admit: 2016-10-20 | Discharge: 2016-10-20 | Disposition: A | Discharge status: Home / Self Care | Payer: Medicare Other | Attending: Emergency Medicine | Admitting: Emergency Medicine

## 2016-10-20 ENCOUNTER — Emergency Department (HOSPITAL_COMMUNITY): Payer: Medicare Other

## 2016-10-20 DIAGNOSIS — R42 Dizziness and giddiness: Secondary | ICD-10-CM | POA: Insufficient documentation

## 2016-10-20 DIAGNOSIS — Z992 Dependence on renal dialysis: Secondary | ICD-10-CM | POA: Diagnosis not present

## 2016-10-20 DIAGNOSIS — I5032 Chronic diastolic (congestive) heart failure: Secondary | ICD-10-CM | POA: Insufficient documentation

## 2016-10-20 DIAGNOSIS — I132 Hypertensive heart and chronic kidney disease with heart failure and with stage 5 chronic kidney disease, or end stage renal disease: Secondary | ICD-10-CM | POA: Diagnosis not present

## 2016-10-20 DIAGNOSIS — Z794 Long term (current) use of insulin: Secondary | ICD-10-CM | POA: Diagnosis not present

## 2016-10-20 DIAGNOSIS — R55 Syncope and collapse: Secondary | ICD-10-CM | POA: Diagnosis present

## 2016-10-20 DIAGNOSIS — N186 End stage renal disease: Secondary | ICD-10-CM | POA: Diagnosis not present

## 2016-10-20 DIAGNOSIS — E1122 Type 2 diabetes mellitus with diabetic chronic kidney disease: Secondary | ICD-10-CM | POA: Diagnosis not present

## 2016-10-20 DIAGNOSIS — Z7982 Long term (current) use of aspirin: Secondary | ICD-10-CM | POA: Diagnosis not present

## 2016-10-20 LAB — COMPREHENSIVE METABOLIC PANEL
ALBUMIN: 3.5 g/dL (ref 3.5–5.0)
ALT: 20 U/L (ref 14–54)
ANION GAP: 12 (ref 5–15)
AST: 31 U/L (ref 15–41)
Alkaline Phosphatase: 133 U/L — ABNORMAL HIGH (ref 38–126)
BILIRUBIN TOTAL: 0.9 mg/dL (ref 0.3–1.2)
BUN: 31 mg/dL — ABNORMAL HIGH (ref 6–20)
CO2: 26 mmol/L (ref 22–32)
Calcium: 8.8 mg/dL — ABNORMAL LOW (ref 8.9–10.3)
Chloride: 103 mmol/L (ref 101–111)
Creatinine, Ser: 6.55 mg/dL — ABNORMAL HIGH (ref 0.44–1.00)
GFR calc Af Amer: 6 mL/min — ABNORMAL LOW (ref >60)
GFR, EST NON AFRICAN AMERICAN: 5 mL/min — AB (ref >60)
GLUCOSE: 202 mg/dL — AB (ref 65–99)
POTASSIUM: 4.2 mmol/L (ref 3.5–5.1)
Sodium: 141 mmol/L (ref 135–145)
TOTAL PROTEIN: 7.2 g/dL (ref 6.5–8.1)

## 2016-10-20 LAB — I-STAT CHEM 8, ED
BUN: 31 mg/dL — ABNORMAL HIGH (ref 6–20)
CALCIUM ION: 1.07 mmol/L — AB (ref 1.15–1.40)
CREATININE: 6.8 mg/dL — AB (ref 0.44–1.00)
Chloride: 101 mmol/L (ref 101–111)
GLUCOSE: 200 mg/dL — AB (ref 65–99)
HCT: 30 % — ABNORMAL LOW (ref 36.0–46.0)
HEMOGLOBIN: 10.2 g/dL — AB (ref 12.0–15.0)
Potassium: 4.1 mmol/L (ref 3.5–5.1)
Sodium: 140 mmol/L (ref 135–145)
TCO2: 27 mmol/L (ref 0–100)

## 2016-10-20 LAB — CBC
HEMATOCRIT: 29.2 % — AB (ref 36.0–46.0)
Hemoglobin: 9.7 g/dL — ABNORMAL LOW (ref 12.0–15.0)
MCH: 30.4 pg (ref 26.0–34.0)
MCHC: 33.2 g/dL (ref 30.0–36.0)
MCV: 91.5 fL (ref 78.0–100.0)
Platelets: 88 10*3/uL — ABNORMAL LOW (ref 150–400)
RBC: 3.19 MIL/uL — ABNORMAL LOW (ref 3.87–5.11)
RDW: 14.5 % (ref 11.5–15.5)
WBC: 7.4 10*3/uL (ref 4.0–10.5)

## 2016-10-20 LAB — I-STAT TROPONIN, ED: TROPONIN I, POC: 0.02 ng/mL (ref 0.00–0.08)

## 2016-10-20 LAB — APTT: APTT: 33 s (ref 24–36)

## 2016-10-20 LAB — PROTIME-INR
INR: 1.07
PROTHROMBIN TIME: 13.9 s (ref 11.4–15.2)

## 2016-10-20 NOTE — Telephone Encounter (Signed)
New Message:   Pt heart rate was up 170 and now it is 90.

## 2016-10-20 NOTE — ED Provider Notes (Signed)
[facility] DEPT Provider Note  CSN: [id number] Arrival date & time: 10/20/16  1431  History   Chief Complaint Chief Complaint  Patient presents with  . Near Syncope   HPI Angela Trujillo is a 83 y.o. female.  The history is provided by the patient, a relative and medical records. No language interpreter was used.  Near Syncope  This is a new problem. The current episode started 3 to 5 hours ago. The problem occurs constantly. The problem has been resolved. Pertinent negatives include no chest pain, no abdominal pain, no headaches and no shortness of breath. Exacerbated by: dialysis. Nothing relieves the symptoms.   Past Medical History:  Diagnosis Date  . Atrial flutter ([place])   . Diabetes mellitus with end stage renal disease ([place])   . Dyspnea    with exertion  . ESRD (end stage renal disease) on dialysis ([place])    HD on T,T, Sa  . GERD (gastroesophageal reflux disease)   . Hypertension   . Pneumonia 10/2015  . Severe aortic stenosis 08/19/2016   s/p TAVR 09/2016 // b. Echo 09/30/16: EF 50-55, inf-septal AK, Gr 2 DD, normally functioning TAVR, mean AV gradient 15 mmHg, mod MAC, mild MS, mild MR, trivial effusion   Patient Active Problem List   Diagnosis Date Noted  . S/P TAVR (transcatheter aortic valve replacement) 10/04/2016  . Hypoglycemia 10/04/2016  . ESRD (end stage renal disease) ([place]) 10/04/2016  . Chronic diastolic CHF (congestive heart failure) ([place]) 10/04/2016  . Altered mental status   . Chronic periodontitis 09/23/2016  . Atrial flutter ([place])   . Anemia 04/15/2013  . Acute renal failure ([place]) 04/15/2013  . HTN (hypertension) 04/15/2013  . DM2 (diabetes mellitus, type 2) ([place]) 04/15/2013   Past Surgical History:  Procedure Laterality Date  . ABDOMINAL HYSTERECTOMY    . CARDIAC CATHETERIZATION N/A 08/19/2016   Procedure: Right/Left Heart Cath and Coronary Angiography;  Surgeon: Sherren Mocha, MD;  Location: [facility];  Service: Cardiovascular;   Laterality: N/A;  . COLONOSCOPY    . IR GENERIC HISTORICAL  08/31/2016   IR RADIOLOGY PERIPHERAL GUIDED IV START 08/31/2016 Corrie Mckusick, DO [facility]  . IR GENERIC HISTORICAL  08/31/2016   IR US GUIDE VASC ACCESS RIGHT 08/31/2016 Corrie Mckusick, DO [facility]  . MULTIPLE EXTRACTIONS WITH ALVEOLOPLASTY N/A 09/25/2016   Procedure: EXTRACTION of tooth number 31 WITH ALVEOLOPLASTY AND GROSS DEBRIDEMENT OF REMAINING TEETH;  Surgeon: Lenn Cal, DDS;  Location: [facility];  Service: Oral Surgery;  Laterality: N/A;  . TEE WITHOUT CARDIOVERSION N/A 09/29/2016   Procedure: TRANSESOPHAGEAL ECHOCARDIOGRAM (TEE);  Surgeon: Sherren Mocha, MD;  Location: [facility];  Service: Open Heart Surgery;  Laterality: N/A;  . TRANSCATHETER AORTIC VALVE REPLACEMENT, TRANSFEMORAL N/A 09/29/2016   Procedure: TRANSCATHETER AORTIC VALVE REPLACEMENT, TRANSFEMORAL;  Surgeon: Sherren Mocha, MD;  Location: [facility];  Service: Open Heart Surgery;  Laterality: N/A;  . TUBAL LIGATION     OB History    No data available     Home Medications    Prior to Admission medications   Medication Sig Start Date End Date Taking? Authorizing Provider  aspirin EC 81 MG EC tablet Take 1 tablet (81 mg total) by mouth daily. 10/05/16   Liliane Shi, PA-C  atorvastatin (LIPITOR) 20 MG tablet Take 20 mg by mouth at bedtime. 06/04/16   Historical Provider, MD  calcium acetate (PHOSLO) 667 MG capsule Take 667 mg by mouth daily with breakfast.    Historical Provider, MD  chlorhexidine (PERIDEX) 0.12 % solution Rinse with 15 mls twice daily for 30 seconds. Use after breakfast and at bedtime. Spit out excess. Do not swallow. 09/23/16   Lenn Cal, DDS  cloNIDine (CATAPRES) 0.1 MG tablet Take 1 tablet (0.1 mg total) by mouth 2 (two) times daily. 10/04/16   Liliane Shi, PA-C  clopidogrel (PLAVIX) 75 MG tablet Take 1 tablet (75 mg total) by mouth daily. 10/05/16   Liliane Shi, PA-C  famotidine (PEPCID) 20 MG tablet Take 20 mg by mouth 2 (two)  times daily.    Historical Provider, MD  hydrALAZINE (APRESOLINE) 50 MG tablet Take 1 tablet (50 mg total) by mouth 3 (three) times daily. 10/04/16   Liliane Shi, PA-C  HYDROcodone-acetaminophen (HYCET) 7.5-325 mg/15 ml solution Take 10-15 mLs by mouth every 6 (six) hours as needed for moderate pain or severe pain. 09/25/16 09/25/17  Lenn Cal, DDS  Insulin Detemir (LEVEMIR FLEXTOUCH) 100 UNIT/ML Pen Inject 14 Units into the skin daily at 10 pm. 10/04/16   Liliane Shi, PA-C  metoprolol succinate (TOPROL-XL) 100 MG 24 hr tablet Take 100 mg by mouth daily. 06/29/16   Historical Provider, MD  Multiple Vitamins-Minerals (MULTIVITAMIN PO) Take 1 tablet by mouth daily.    Historical Provider, MD  multivitamin (RENA-VIT) TABS tablet Take 1 tablet by mouth at bedtime. 10/04/16   Liliane Shi, PA-C  SENSIPAR 30 MG tablet Take 30 mg by mouth Every Tuesday,Thursday,and Saturday with dialysis.  08/06/16   Historical Provider, MD   Family History Family History  Problem Relation Age of Onset  . CAD Neg Hx    Social History Social History  Substance Use Topics  . Smoking status: Never Smoker  . Smokeless tobacco: Never Used  . Alcohol use No   Allergies   Asa [aspirin]; Codeine; and Penicillins  Review of Systems Review of Systems  Constitutional: Positive for fatigue. Negative for appetite change, chills and fever.  Respiratory: Negative for shortness of breath.   Cardiovascular: Positive for near-syncope. Negative for chest pain and leg swelling.  Gastrointestinal: Negative for abdominal distention and abdominal pain.  Neurological: Positive for light-headedness. Negative for tremors, seizures, syncope, facial asymmetry, speech difficulty, weakness, numbness and headaches.  All other systems reviewed and are negative.  Physical Exam Updated Vital Signs BP 175/76 (BP Location: Right Arm)   Pulse 83   Temp 98.9 F (37.2 C) (Oral)   Resp 14   Ht _0  (1.651 m)   Wt 66.7 kg   SpO2  98%   BMI 24.46 kg/m   Physical Exam  Constitutional: She is oriented to person, place, and time. No distress.  Overweight elderly AA female  HENT:  Head: Normocephalic and atraumatic.  Eyes: EOM are normal. Pupils are equal, round, and reactive to light.  Neck: Normal range of motion. Neck supple.  Cardiovascular: Normal rate, regular rhythm and normal heart sounds.   Pulmonary/Chest: Effort normal and breath sounds normal.  Abdominal: Soft. Bowel sounds are normal. She exhibits no distension. There is no tenderness.  Musculoskeletal: Normal range of motion.  AVF to RUE with palpable thrill  Neurological: She is alert and oriented to person, place, and time.  Skin: Skin is warm and dry. Capillary refill takes less than 2 seconds. She is not diaphoretic.  Nursing note and vitals reviewed.  ED Treatments / Results  Labs (all labs ordered are listed, but only abnormal results are displayed) Labs Reviewed  CBC - Abnormal; Notable for the  following:       Result Value   RBC 3.19 (*)    Hemoglobin 9.7 (*)    HCT 29.2 (*)    Platelets 88 (*)    All other components within normal limits  COMPREHENSIVE METABOLIC PANEL - Abnormal; Notable for the following:    Glucose, Bld 202 (*)    BUN 31 (*)    Creatinine, Ser 6.55 (*)    Calcium 8.8 (*)    Alkaline Phosphatase 133 (*)    GFR calc non Af Amer 5 (*)    GFR calc Af Amer 6 (*)    All other components within normal limits  I-STAT CHEM 8, ED - Abnormal; Notable for the following:    BUN 31 (*)    Creatinine, Ser 6.80 (*)    Glucose, Bld 200 (*)    Calcium, Ion 1.07 (*)    Hemoglobin 10.2 (*)    HCT 30.0 (*)    All other components within normal limits  APTT  PROTIME-INR  I-STAT TROPOININ, ED   EKG  EKG Interpretation  Date/Time:  Tuesday October 20 2016 14:36:33 EST Ventricular Rate:  86 PR Interval:    QRS Duration: 154 QT Interval:  444 QTC Calculation: 532 R Axis:   -64 Text Interpretation:  Sinus rhythm  Prolonged PR interval RBBB and LAFB LVH with secondary repolarization abnormality Confirmed by Ashok Cordia  MD, Lennette Bihari ([id number]) on 10/20/2016 2:50:45 PM      Radiology Dg Chest Portable 1 View  Result Date: 10/20/2016 CLINICAL DATA:  Lightheadedness during dialysis. Aortic valve replacement performed 2 weeks ago. EXAM: PORTABLE CHEST 1 VIEW COMPARISON:  10/02/2016 and earlier priors FINDINGS: Stable cardiomegaly. Aortic valve prosthesis stable. Atherosclerotic calcification of the aortic arch. Pulmonary vascularity within normal limits. No focal airspace disease, pulmonary edema or pleural effusion. Negative for pneumothorax. Right axillary region vascular stent. IMPRESSION: Cardiac enlargement and aortic valve replacement. No acute abnormality identified. Electronically Signed   By: Curlene Dolphin M.D.   On: 10/20/2016 16:07   Procedures Procedures (including critical care time)  Medications Ordered in ED Medications - No data to display  Initial Impression / Assessment and Plan / ED Course  I have reviewed the triage vital signs and the nursing notes.  83 y.o. female with above stated PMHx, HPI, and physical. PMHx of ESRD (iHD TTS via RUE AVF), dCHF, severe AS w pHTN s/p TAVR 09/29/16, DM, HTN, & A-flutter. Pt undergoing dialysis today as scheduled. Made it through 1 hour of her typical 3.5 hour session and Pt began to experience generalized weakness/fatigue & lightheadedness described as near-syncope. Dialysis stopped. HR in 170's. Given back fluid. HR improved. Symptoms lasted 30 minutes total. No recent infectious symptoms including no fever, chills, cough, emesis, diarrhea, dysuria, or urinary frequency. No focal neurologic symptoms including no focal weakness, numbness, tingling, vision changes, slurred speech, or facial droop. Pt has been in normal health before dialysis. Eating and drinking well.  EKG shows no ST elevation/depression or new T-wave inversions. I-STAT troponin undetectable. BMP  notable for elevated creatinine (known ESRD) but no other electrolyte abnormalities. CBC without leukocytosis and notable for chronic anemia unchanged from patient's baseline.  Laboratory and imaging results were personally reviewed by myself and used in the medical decision making of this patient's treatment and disposition.  I spoke with cardiology over the phone who is aware about patient's presentation, presents in the emergency department, and her workup - agree this is most likely related to her fluid fluctuations and  not her cardiac procedure and are okay with management as discussed. Pt discharged home in stable condition. Strict ED return precautions dicussed. Pt understands and agrees with the plan and has no further questions or concerns.  Pt care discussed with and followed by my attending, Dr. Vidal Schwalbe, MD Pager [phone]  Final Clinical Impressions(s) / ED Diagnoses   Final diagnoses:  Lightheadedness   New Prescriptions Discharge Medication List as of 10/20/2016  5:47 PM       Mayer Camel, MD 10/20/16 2250    Isla Pence, MD 10/29/16 2037

## 2016-10-20 NOTE — Telephone Encounter (Signed)
I also contacted Abagail Kitchens and made him aware that I will make Dr Burt Knack aware that the pt is coming to the ER.

## 2016-10-20 NOTE — Telephone Encounter (Signed)
Patients son called.  Said that his mom was at dialysis and became tachycardic @170 .  Is being transported to [facility].  Notified ED of impending arrival. Patient has upcoming appt with Dr Burt Knack 10-23-16.

## 2016-10-20 NOTE — ED Triage Notes (Addendum)
Pt to ED via [facility] from [place] in [facility]-- pt had an episode of near syncope. Pt was half way through treatment and became lethargic and diaphoretic-- received 200 cc IV in Dialysis graft-- on arrival pt feels fine, states is ready to go home. IV team here to de-access graft.

## 2016-10-20 NOTE — ED Notes (Signed)
Pt remains alert and oriented x's 3, skin warm and dry.  No complaints voiced by pt.  Family at bedside

## 2016-10-21 ENCOUNTER — Telehealth: Payer: Self-pay | Admitting: Cardiovascular Disease

## 2016-10-21 NOTE — Telephone Encounter (Signed)
Angela Trujillo is calling on behalf of his mother, he is requesting a call back. Thanks.

## 2016-10-21 NOTE — Telephone Encounter (Signed)
Returned call to patient's son Angela Trujillo stated when he checked mother's B/P this morning pulse 163 B/P 118/77.Stated she was doing ok just felt hot.He rechecked pulse 74.Advised sounds like a error.Advised when your heart is out of rhythm machines cannot count pulse accurately.Advised to keep appointment with Dr.Cooper 10/23/16.

## 2016-10-22 ENCOUNTER — Encounter (HOSPITAL_COMMUNITY): Payer: Self-pay

## 2016-10-22 ENCOUNTER — Observation Stay (HOSPITAL_COMMUNITY)
Admission: EM | Admit: 2016-10-22 | Discharge: 2016-10-23 | Disposition: A | Discharge status: Home / Self Care | Payer: Medicare Other | Attending: Internal Medicine | Admitting: Internal Medicine

## 2016-10-22 ENCOUNTER — Emergency Department (HOSPITAL_COMMUNITY): Payer: Medicare Other

## 2016-10-22 ENCOUNTER — Telehealth: Payer: Self-pay | Admitting: Cardiovascular Disease

## 2016-10-22 DIAGNOSIS — I5032 Chronic diastolic (congestive) heart failure: Secondary | ICD-10-CM | POA: Diagnosis present

## 2016-10-22 DIAGNOSIS — I4510 Unspecified right bundle-branch block: Secondary | ICD-10-CM | POA: Insufficient documentation

## 2016-10-22 DIAGNOSIS — I4892 Unspecified atrial flutter: Secondary | ICD-10-CM | POA: Diagnosis not present

## 2016-10-22 DIAGNOSIS — D649 Anemia, unspecified: Secondary | ICD-10-CM | POA: Diagnosis not present

## 2016-10-22 DIAGNOSIS — Z886 Allergy status to analgesic agent status: Secondary | ICD-10-CM | POA: Diagnosis not present

## 2016-10-22 DIAGNOSIS — I482 Chronic atrial fibrillation, unspecified: Secondary | ICD-10-CM | POA: Diagnosis present

## 2016-10-22 DIAGNOSIS — D696 Thrombocytopenia, unspecified: Secondary | ICD-10-CM | POA: Diagnosis not present

## 2016-10-22 DIAGNOSIS — Z7902 Long term (current) use of antithrombotics/antiplatelets: Secondary | ICD-10-CM | POA: Diagnosis not present

## 2016-10-22 DIAGNOSIS — I10 Essential (primary) hypertension: Secondary | ICD-10-CM | POA: Diagnosis not present

## 2016-10-22 DIAGNOSIS — R55 Syncope and collapse: Secondary | ICD-10-CM | POA: Insufficient documentation

## 2016-10-22 DIAGNOSIS — Z7982 Long term (current) use of aspirin: Secondary | ICD-10-CM | POA: Insufficient documentation

## 2016-10-22 DIAGNOSIS — K219 Gastro-esophageal reflux disease without esophagitis: Secondary | ICD-10-CM | POA: Insufficient documentation

## 2016-10-22 DIAGNOSIS — I4891 Unspecified atrial fibrillation: Secondary | ICD-10-CM | POA: Diagnosis not present

## 2016-10-22 DIAGNOSIS — E1122 Type 2 diabetes mellitus with diabetic chronic kidney disease: Secondary | ICD-10-CM | POA: Insufficient documentation

## 2016-10-22 DIAGNOSIS — R7989 Other specified abnormal findings of blood chemistry: Secondary | ICD-10-CM | POA: Diagnosis present

## 2016-10-22 DIAGNOSIS — R778 Other specified abnormalities of plasma proteins: Secondary | ICD-10-CM | POA: Diagnosis present

## 2016-10-22 DIAGNOSIS — E119 Type 2 diabetes mellitus without complications: Secondary | ICD-10-CM | POA: Diagnosis not present

## 2016-10-22 DIAGNOSIS — Z952 Presence of prosthetic heart valve: Secondary | ICD-10-CM | POA: Diagnosis not present

## 2016-10-22 DIAGNOSIS — R748 Abnormal levels of other serum enzymes: Secondary | ICD-10-CM

## 2016-10-22 DIAGNOSIS — I471 Supraventricular tachycardia: Secondary | ICD-10-CM | POA: Diagnosis not present

## 2016-10-22 DIAGNOSIS — Z880 Allergy status to penicillin: Secondary | ICD-10-CM | POA: Insufficient documentation

## 2016-10-22 DIAGNOSIS — Z992 Dependence on renal dialysis: Secondary | ICD-10-CM | POA: Insufficient documentation

## 2016-10-22 DIAGNOSIS — I132 Hypertensive heart and chronic kidney disease with heart failure and with stage 5 chronic kidney disease, or end stage renal disease: Secondary | ICD-10-CM | POA: Insufficient documentation

## 2016-10-22 DIAGNOSIS — N186 End stage renal disease: Secondary | ICD-10-CM | POA: Diagnosis not present

## 2016-10-22 DIAGNOSIS — Z8249 Family history of ischemic heart disease and other diseases of the circulatory system: Secondary | ICD-10-CM | POA: Diagnosis not present

## 2016-10-22 DIAGNOSIS — I700 Atherosclerosis of aorta: Secondary | ICD-10-CM | POA: Diagnosis not present

## 2016-10-22 DIAGNOSIS — Z794 Long term (current) use of insulin: Secondary | ICD-10-CM | POA: Insufficient documentation

## 2016-10-22 DIAGNOSIS — I480 Paroxysmal atrial fibrillation: Principal | ICD-10-CM | POA: Insufficient documentation

## 2016-10-22 DIAGNOSIS — R112 Nausea with vomiting, unspecified: Secondary | ICD-10-CM | POA: Insufficient documentation

## 2016-10-22 DIAGNOSIS — D631 Anemia in chronic kidney disease: Secondary | ICD-10-CM | POA: Diagnosis not present

## 2016-10-22 HISTORY — DX: Unspecified atrial fibrillation: I48.91

## 2016-10-22 LAB — COMPREHENSIVE METABOLIC PANEL
ALBUMIN: 3.6 g/dL (ref 3.5–5.0)
ALK PHOS: 139 U/L — AB (ref 38–126)
ALT: 20 U/L (ref 14–54)
AST: 33 U/L (ref 15–41)
Anion gap: 14 (ref 5–15)
BILIRUBIN TOTAL: 1.5 mg/dL — AB (ref 0.3–1.2)
BUN: 17 mg/dL (ref 6–20)
CALCIUM: 9 mg/dL (ref 8.9–10.3)
CO2: 23 mmol/L (ref 22–32)
Chloride: 101 mmol/L (ref 101–111)
Creatinine, Ser: 4.46 mg/dL — ABNORMAL HIGH (ref 0.44–1.00)
GFR calc Af Amer: 10 mL/min — ABNORMAL LOW (ref >60)
GFR calc non Af Amer: 8 mL/min — ABNORMAL LOW (ref >60)
GLUCOSE: 116 mg/dL — AB (ref 65–99)
Potassium: 3.8 mmol/L (ref 3.5–5.1)
Sodium: 138 mmol/L (ref 135–145)
TOTAL PROTEIN: 7.4 g/dL (ref 6.5–8.1)

## 2016-10-22 LAB — CBC WITH DIFFERENTIAL/PLATELET
BASOS ABS: 0 10*3/uL (ref 0.0–0.1)
BASOS PCT: 0 %
Eosinophils Absolute: 0 10*3/uL (ref 0.0–0.7)
Eosinophils Relative: 0 %
HEMATOCRIT: 27.1 % — AB (ref 36.0–46.0)
Hemoglobin: 9 g/dL — ABNORMAL LOW (ref 12.0–15.0)
Lymphocytes Relative: 9 %
Lymphs Abs: 1.2 10*3/uL (ref 0.7–4.0)
MCH: 30.6 pg (ref 26.0–34.0)
MCHC: 33.2 g/dL (ref 30.0–36.0)
MCV: 92.2 fL (ref 78.0–100.0)
MONOS PCT: 3 %
Monocytes Absolute: 0.5 10*3/uL (ref 0.1–1.0)
NEUTROS ABS: 11.9 10*3/uL — AB (ref 1.7–7.7)
NEUTROS PCT: 87 %
Platelets: 110 10*3/uL — ABNORMAL LOW (ref 150–400)
RBC: 2.94 MIL/uL — AB (ref 3.87–5.11)
RDW: 15.1 % (ref 11.5–15.5)
WBC: 13.7 10*3/uL — ABNORMAL HIGH (ref 4.0–10.5)

## 2016-10-22 LAB — GLUCOSE, CAPILLARY: Glucose-Capillary: 150 mg/dL — ABNORMAL HIGH (ref 65–99)

## 2016-10-22 LAB — I-STAT TROPONIN, ED: Troponin i, poc: 0.11 ng/mL (ref 0.00–0.08)

## 2016-10-22 LAB — IRON AND TIBC
IRON: 35 ug/dL (ref 28–170)
SATURATION RATIOS: 16 % (ref 10.4–31.8)
TIBC: 214 ug/dL — AB (ref 250–450)
UIBC: 179 ug/dL

## 2016-10-22 LAB — RETICULOCYTES
RBC.: 2.9 MIL/uL — ABNORMAL LOW (ref 3.87–5.11)
Retic Count, Absolute: 43.5 10*3/uL (ref 19.0–186.0)
Retic Ct Pct: 1.5 % (ref 0.4–3.1)

## 2016-10-22 LAB — TSH: TSH: 2.219 u[IU]/mL (ref 0.350–4.500)

## 2016-10-22 LAB — TROPONIN I: Troponin I: 0.2 ng/mL (ref <0.03)

## 2016-10-22 LAB — T4, FREE: Free T4: 1.34 ng/dL — ABNORMAL HIGH (ref 0.61–1.12)

## 2016-10-22 LAB — VITAMIN B12: Vitamin B-12: 1208 pg/mL — ABNORMAL HIGH (ref 180–914)

## 2016-10-22 LAB — FERRITIN: Ferritin: 1067 ng/mL — ABNORMAL HIGH (ref 11–307)

## 2016-10-22 MED ORDER — RENA-VITE PO TABS: 1.0000 | ORAL_TABLET | Freq: Every day | ORAL | Status: DC

## 2016-10-22 MED ORDER — CLONIDINE HCL 0.1 MG PO TABS
0.1000 mg | ORAL_TABLET | Freq: Two times a day (BID) | ORAL | Status: DC
  Administered 2016-10-22 – 2016-10-23 (×2): 0.1 mg via ORAL
  Filled 2016-10-22 (×3): qty 1

## 2016-10-22 MED ORDER — ACETAMINOPHEN 325 MG PO TABS: 650.0000 mg | ORAL_TABLET | ORAL | Status: DC | PRN

## 2016-10-22 MED ORDER — INSULIN DETEMIR 100 UNIT/ML ~~LOC~~ SOLN
10.0000 [IU] | Freq: Every day | SUBCUTANEOUS | Status: DC
  Administered 2016-10-22: 10 [IU] via SUBCUTANEOUS
  Filled 2016-10-22 (×2): qty 0.1

## 2016-10-22 MED ORDER — METOPROLOL SUCCINATE ER 100 MG PO TB24
100.0000 mg | ORAL_TABLET | Freq: Every day | ORAL | Status: DC
  Administered 2016-10-23: 100 mg via ORAL
  Filled 2016-10-22: qty 1

## 2016-10-22 MED ORDER — ONDANSETRON HCL 4 MG/2ML IJ SOLN: 4.0000 mg | Freq: Four times a day (QID) | INTRAMUSCULAR | Status: DC | PRN

## 2016-10-22 MED ORDER — DILTIAZEM LOAD VIA INFUSION
10.0000 mg | Freq: Once | INTRAVENOUS | Status: AC
  Administered 2016-10-22: 10 mg via INTRAVENOUS
  Filled 2016-10-22: qty 10

## 2016-10-22 MED ORDER — CLOPIDOGREL BISULFATE 75 MG PO TABS
75.0000 mg | ORAL_TABLET | Freq: Every day | ORAL | Status: DC
  Administered 2016-10-23: 75 mg via ORAL
  Filled 2016-10-22: qty 1

## 2016-10-22 MED ORDER — METOPROLOL TARTRATE 25 MG PO TABS
100.0000 mg | ORAL_TABLET | Freq: Once | ORAL | Status: AC
  Administered 2016-10-22: 100 mg via ORAL
  Filled 2016-10-22: qty 4

## 2016-10-22 MED ORDER — HEPARIN (PORCINE) IN NACL 100-0.45 UNIT/ML-% IJ SOLN
1150.0000 [IU]/h | INTRAMUSCULAR | Status: DC
  Administered 2016-10-22: 950 [IU]/h via INTRAVENOUS
  Filled 2016-10-22: qty 250

## 2016-10-22 MED ORDER — HYDROCODONE-ACETAMINOPHEN 7.5-325 MG/15ML PO SOLN: 10.0000 mL | Freq: Four times a day (QID) | ORAL | Status: DC | PRN

## 2016-10-22 MED ORDER — INSULIN ASPART 100 UNIT/ML ~~LOC~~ SOLN: 0.0000 [IU] | Freq: Every day | SUBCUTANEOUS | Status: DC

## 2016-10-22 MED ORDER — FAMOTIDINE 20 MG PO TABS
20.0000 mg | ORAL_TABLET | Freq: Every day | ORAL | Status: DC
  Administered 2016-10-23: 20 mg via ORAL
  Filled 2016-10-22: qty 1

## 2016-10-22 MED ORDER — METOPROLOL TARTRATE 5 MG/5ML IV SOLN
5.0000 mg | INTRAVENOUS | Status: DC | PRN
  Filled 2016-10-22: qty 5

## 2016-10-22 MED ORDER — METOPROLOL TARTRATE 5 MG/5ML IV SOLN
5.0000 mg | Freq: Once | INTRAVENOUS | Status: AC
  Administered 2016-10-22: 5 mg via INTRAVENOUS
  Filled 2016-10-22: qty 5

## 2016-10-22 MED ORDER — CALCIUM ACETATE (PHOS BINDER) 667 MG PO CAPS
667.0000 mg | ORAL_CAPSULE | Freq: Every day | ORAL | Status: DC
  Administered 2016-10-23: 667 mg via ORAL
  Filled 2016-10-22: qty 1

## 2016-10-22 MED ORDER — HEPARIN BOLUS VIA INFUSION
3000.0000 [IU] | Freq: Once | INTRAVENOUS | Status: AC
  Administered 2016-10-22: 3000 [IU] via INTRAVENOUS
  Filled 2016-10-22: qty 3000

## 2016-10-22 MED ORDER — INSULIN ASPART 100 UNIT/ML ~~LOC~~ SOLN: 0.0000 [IU] | Freq: Three times a day (TID) | SUBCUTANEOUS | Status: DC

## 2016-10-22 MED ORDER — AMIODARONE HCL 200 MG PO TABS
200.0000 mg | ORAL_TABLET | Freq: Two times a day (BID) | ORAL | Status: DC
  Administered 2016-10-22 – 2016-10-23 (×2): 200 mg via ORAL
  Filled 2016-10-22 (×2): qty 1

## 2016-10-22 MED ORDER — HYDRALAZINE HCL 50 MG PO TABS
50.0000 mg | ORAL_TABLET | Freq: Three times a day (TID) | ORAL | Status: DC
  Administered 2016-10-22 – 2016-10-23 (×3): 50 mg via ORAL
  Filled 2016-10-22 (×3): qty 1

## 2016-10-22 MED ORDER — DEXTROSE 5 % IV SOLN
5.0000 mg/h | INTRAVENOUS | Status: DC
  Administered 2016-10-22: 5 mg/h via INTRAVENOUS
  Filled 2016-10-22: qty 100

## 2016-10-22 MED ORDER — WARFARIN SODIUM 5 MG PO TABS
5.0000 mg | ORAL_TABLET | Freq: Once | ORAL | Status: AC
  Administered 2016-10-22: 5 mg via ORAL
  Filled 2016-10-22: qty 1

## 2016-10-22 MED ORDER — WARFARIN - PHARMACIST DOSING INPATIENT: Freq: Every day | Status: DC

## 2016-10-22 MED ORDER — CINACALCET HCL 30 MG PO TABS: 30.0000 mg | ORAL_TABLET | ORAL | Status: DC

## 2016-10-22 MED ORDER — ATORVASTATIN CALCIUM 20 MG PO TABS
20.0000 mg | ORAL_TABLET | Freq: Every day | ORAL | Status: DC
  Administered 2016-10-22: 20 mg via ORAL
  Filled 2016-10-22: qty 1

## 2016-10-22 NOTE — Consult Note (Signed)
Cardiology Consult    Patient ID: Angela Trujillo MRN: [id number], DOB/AGE: [birth year]   Admit date: 10/22/2016 Date of Consult: 10/22/2016  Primary Physician: Doretha Sou, MD Reason for Consult: Afib RVR Primary Cardiologist: Dr. Burt Knack (did TAVR, has not seen her as a reg. Patient) Requesting Provider: Dr. Maryan Rued   Patient Profile    Angela Trujillo is a 83 year old female with a past medical history of ESRD on HD, severe AS s/p TAVR in Jan 2018. She presented with atrial fib with RVR with rates as high as 170, cardiology consulted.   History of Present Illness    Angela Trujillo was at dialysis today and developed rapid heart rates in the 160-170's and weakness. EMS was called and she was transferred to [facility] ED. Upon arrival to the ED her EKG showed Afib with RBBB with rate of 133. She was started on a diltiazem gtt at 20m/hour, and was on it for about 45 minutes without rate improvement. Then her drip was turned up to 158mhour and her HR actually increased to the 170's sustained. Cardizem was then turned off and she converted to NSR with a RBBB.   She carries a diagnosis of atrial flutter, although this has not been recently documented and she is not on any anticoagulation. She recently had a TAVR with Dr. CoBurt Knackn 09/29/16. She was started on Plavix post TAVR.   She was seen in the ED just 2 days ago with near syncope after dialysis, EKG from that visit shows NSR with RBBB.   She denies chest pain and SOB. She does feel palpitations and weakness with her rapid Afib.   Past Medical History   Past Medical History:  Diagnosis Date  . [place]  . Diabetes mellitus with end stage renal disease ([facility]  . Dyspnea    with exertion  . ESRD (end stage renal disease) [place] dialysis ([place]   HD on T,T, Sa  . GERD (gastroesophageal reflux disease)   . Hypertension   . Pneumonia 10/2015  . Severe aortic stenosis 08/19/2016   s/p TAVR 09/2016 // b. Echo 09/30/16: EF  50-55, inf-septal AK, Gr 2 DD, normally functioning TAVR, mean AV gradient 15 mmHg, mod MAC, mild MS, mild MR, trivial effusion    Past Surgical History:  Procedure Laterality Date  . ABDOMINAL HYSTERECTOMY    . CARDIAC CATHETERIZATION N/A 08/19/2016   Procedure: Right/Left Heart Cath and Coronary Angiography;  Surgeon: MiSherren MochaMD;  [facility];  Service: Cardiovascular;  Laterality: N/A;  . COLONOSCOPY    . IR GENERIC HISTORICAL  08/31/2016   IR RADIOLOGY PERIPHERAL GUIDED IV START 08/31/2016 JaCorrie MckusickDO [facility]  . IR GENERIC HISTORICAL  08/31/2016   IR [place] ACCESS RIGHT 08/31/2016 JaCorrie MckusickDO [facility]  . MULTIPLE EXTRACTIONS WITH ALVEOLOPLASTY N/A 09/25/2016   Procedure: EXTRACTION of tooth number 31 WITH ALVEOLOPLASTY AND GROSS DEBRIDEMENT OF REMAINING TEETH;  Surgeon: RoLenn CalDDS;  [place] [facility] Service: Oral Surgery;  Laterality: N/A;  . TEE WITHOUT CARDIOVERSION N/A 09/29/2016   Procedure: TRANSESOPHAGEAL ECHOCARDIOGRAM (TEE);  Surgeon: MiSherren MochaMD;  [facility] Service: Open Heart Surgery;  Laterality: N/A;  . TRANSCATHETER AORTIC VALVE REPLACEMENT, TRANSFEMORAL N/A 09/29/2016   Procedure: TRANSCATHETER AORTIC VALVE REPLACEMENT, TRANSFEMORAL;  Surgeon: MiSherren MochaMD;  [place] [facility] Service: Open [facility] Surgery;  Laterality: N/A;  . TUBAL LIGATION  Allergies  Allergies  Allergen Reactions  . Asa [Aspirin] Nausea Only and Other (See Comments)    MAKES STOMACH HURT  . Codeine Rash  . Penicillins Rash     Has patient had a PCN reaction causing immediate rash, facial/tongue/throat swelling, SOB or lightheadedness with hypotension:  #  #  #  NO  #  #  #  Has patient had a PCN reaction causing severe rash involving mucus membranes or skin necrosis:  #  #  #  NO  #  #  # Has patient had a PCN reaction that required hospitalization:  #  #  #  NO  #  #  #  Has patient had a PCN reaction occurring  within the last 10 years:  #  #  #  NO  #  #  #  If all answers are "NO", may proceed with Cephalosporin    Inpatient Medications      Family History    Family History  Problem Relation Age of Onset  . CAD Neg Hx     Social History    Social History   Social History  . Marital status: Widowed    Spouse name: N/A  . Number of children: 5  . Years of education: N/A   Occupational History  . Not on file.   Social History Main Topics  . Smoking status: Never Smoker  . Smokeless tobacco: Never Used  . Alcohol use No  . Drug use: No  . Sexual activity: No   Other Topics Concern  . Not on file   Social History Narrative  . No narrative on file     Review of Systems    General:  No chills, fever, night sweats or weight changes.  Cardiovascular:  No chest pain, dyspnea on exertion, edema, orthopnea, + palpitations, paroxysmal nocturnal dyspnea. Dermatological: No rash, lesions/masses Respiratory: No cough, dyspnea Urologic: No hematuria, dysuria Abdominal:   No nausea, vomiting, diarrhea, bright red blood per rectum, melena, or hematemesis Neurologic:  No visual changes, wkns, changes in mental status. All other systems reviewed and are otherwise negative except as noted above.  Physical Exam    Blood pressure 169/78, pulse 84, temperature 99.5 F (37.5 C), temperature source Oral, resp. rate 17, weight 147 lb (66.7 kg), SpO2 98 %.  General: Pleasant, NAD Psych: Normal affect. Neuro: Alert and oriented X 3. Moves all extremities spontaneously. HEENT: Normal  Neck: Supple without bruits or JVD. Lungs:  Resp regular and unlabored, CTA. Heart: RRR no s3, s4, or murmurs. Abdomen: Soft, non-tender, non-distended, BS + x 4.  Extremities: No clubbing, cyanosis or edema. DP/PT/Radials 2+ and equal bilaterally.  Labs    Troponin [facility] of Care Test)  Recent Labs  10/22/16 1652  TROPIPOC 0.11*   No results for input(s): CKTOTAL, CKMB, TROPONINI in the last 72  hours. Lab Results  Component Value Date   WBC 13.7 (H) 10/22/2016   HGB 9.0 (L) 10/22/2016   HCT 27.1 (L) 10/22/2016   MCV 92.2 10/22/2016   PLT 110 (L) 10/22/2016    Recent Labs Lab 10/22/16 1610  NA 138  K 3.8  CL 101  CO2 23  BUN 17  CREATININE 4.46*  CALCIUM 9.0  PROT 7.4  BILITOT 1.5*  ALKPHOS 139*  ALT 20  AST 33  GLUCOSE 116*   Lab Results  Component Value Date   CHOL 181 04/15/2013   HDL 62 04/15/2013   LDLCALC 101 (H) 04/15/2013  TRIG 91 04/15/2013   No results found for: [facility]   Radiology Studies    Dg Chest 2 View  Result Date: 09/25/2016 CLINICAL DATA:  Previous TAVR.  Dental surgery. EXAM: CHEST  2 VIEW COMPARISON:  12/13/2015 FINDINGS: Chronic cardiomegaly. Chronic aortic atherosclerotic calcification. Pulmonary vascularity is normal. Lungs are clear. No effusions. No acute bone finding. IMPRESSION: Cardiomegaly.  Aortic atherosclerosis.  No active disease. Electronically Signed   By: Nelson Chimes M.D.   On: 09/25/2016 08:03   Dg Chest Port 1 View  Result Date: 10/22/2016 CLINICAL DATA:  Nausea and vomiting and uncontrolled atrial fibrillation today. Patient has dialysis dependent renal failure. History of hypertension and diabetes. EXAM: PORTABLE CHEST 1 VIEW COMPARISON:  Portable chest x-ray of October 20, 2016 FINDINGS: The lungs are well-expanded. The interstitial markings are increased. The pulmonary vascularity is engorged. The cardiac silhouette is enlarged. There is calcification in the wall of the aortic arch. IMPRESSION: CHF with mild pulmonary interstitial edema. No alveolar pneumonia nor pleural effusion. Thoracic aortic atherosclerosis. Electronically Signed   By: David  Martinique M.D.   On: 10/22/2016 16:37   Dg Chest Portable 1 View  Result Date: 10/20/2016 CLINICAL DATA:  Lightheadedness during dialysis. Aortic valve replacement performed 2 weeks ago. EXAM: PORTABLE CHEST 1 VIEW COMPARISON:  10/02/2016 and earlier priors FINDINGS: Stable  cardiomegaly. Aortic valve prosthesis stable. Atherosclerotic calcification of the aortic arch. Pulmonary vascularity within normal limits. No focal airspace disease, pulmonary edema or pleural effusion. Negative for pneumothorax. Right axillary region vascular stent. IMPRESSION: Cardiac enlargement and aortic valve replacement. No acute abnormality identified. Electronically Signed   By: Curlene Dolphin M.D.   On: 10/20/2016 16:07   Dg Chest Port 1 View  Result Date: 10/02/2016 CLINICAL DATA:  Status post transcatheter aortic valve replacement. Altered mental status. Code stroke. EXAM: PORTABLE CHEST 1 VIEW COMPARISON:  09/30/2016 FINDINGS: Postoperative aortic valve prosthesis. Cardiac enlargement without vascular congestion. No focal airspace disease or consolidation in the lungs. No blunting of costophrenic angles. No pneumothorax. Right central venous catheter with tip over the cavoatrial junction region. Calcification of the aorta. Vascular stent in the right axilla. IMPRESSION: Cardiac enlargement.  No evidence of active pulmonary disease. Electronically Signed   By: Lucienne Capers M.D.   On: 10/02/2016 03:40   Dg Chest Port 1 View  Result Date: 09/30/2016 CLINICAL DATA:  Chest soreness, post transcatheter aortic valve replacement EXAM: PORTABLE CHEST 1 VIEW COMPARISON:  Chest x-ray of 09/29/2016 FINDINGS: No active infiltrate or effusion is seen. No pneumothorax is noted. There is cardiomegaly present and there may be very minimal pulmonary vascular congestion. Prosthetic aortic valve replacement is noted. Right IJ central venous line tip overlies the lower SVC. IMPRESSION: 1. Stable cardiomegaly. Cannot exclude mild pulmonary vascular congestion. 2. Right IJ central venous line catheter tip overlies the lower SVC. 3. Aortic valve replacement is noted. . Electronically Signed   By: Ivar Drape M.D.   On: 09/30/2016 08:33   Dg Chest Port 1 View  Result Date: 09/29/2016 CLINICAL DATA:  Severe aortic  stenosis status post transcatheter aortic valve replacement. EXAM: PORTABLE CHEST 1 VIEW COMPARISON:  PA and lateral chest x-ray of September 25, 2016 FINDINGS: There has been interval placement of a prosthetic aortic valve cage Assembly. The cardiac silhouette remains enlarged. The pulmonary vascularity is mildly prominent centrally. There is calcification in the wall of the aortic arch. There is no pleural effusion or pneumothorax. The right internal jugular venous catheter tip projects over the lower  third of the SVC. IMPRESSION: No postprocedure complication observed following transcatheter aortic valve replacement. Mild stable cardiomegaly with minimal pulmonary vascular congestion. Thoracic aortic atherosclerosis. Electronically Signed   By: David  Martinique M.D.   On: 09/29/2016 14:53   Ct Head Code Stroke Wo Contrast  Result Date: 10/02/2016 CLINICAL DATA:  Code stroke.  Confusion and altered mental status EXAM: CT HEAD WITHOUT CONTRAST TECHNIQUE: Contiguous axial images were obtained from the base of the skull through the vertex without intravenous contrast. COMPARISON:  None. FINDINGS: Brain: No mass lesion, intraparenchymal hemorrhage or extra-axial collection. No evidence of acute cortical infarct. There is periventricular hypoattenuation compatible with chronic microvascular disease. Vascular: Atherosclerotic calcification of the vertebral and internal carotid arteries at the skull base. Skull: Normal visualized skull base, calvarium and extracranial soft tissues. Sinuses/Orbits: Partial opacification of the sphenoid sinuses. Normal orbits. ASPECTS [facility] Stroke Program Early CT Score) - Ganglionic level infarction (caudate, lentiform nuclei, internal capsule, insula, M1-M3 cortex): 7 - Supraganglionic infarction (M4-M6 cortex): 3 Total score (0-10 with 10 being normal): 10 IMPRESSION: 1. Chronic microvascular ischemia and volume loss without acute intracranial abnormality. 2. ASPECTS is 10. These  results were called by telephone at the time of interpretation on 10/02/2016 at 4:00 am to Dr. Kerney Elbe, who verbally acknowledged these results. Electronically Signed   By: Ulyses Jarred M.D.   On: 10/02/2016 04:00    EKG & Cardiac Imaging    EKG: NSR, RBBB  Echocardiogram: 09/30/16 Study Conclusions  - Left ventricle: The cavity size was normal. Systolic function was   normal. The estimated ejection fraction was in the range of 50%   to 55%. There is akinesis of the midinferoseptal myocardium.   Features are consistent with a pseudonormal left ventricular   filling pattern, with concomitant abnormal relaxation and   increased filling pressure (grade 2 diastolic dysfunction). - Aortic valve: A TAVR bioprosthesis was present and functioning   normally. Mean gradient (S): 15 mm Hg. Valve area (Vmean): 1.47   cm^2. - Mitral valve: Moderately calcified annulus. Mobility of the   posterior leaflet was mildly restricted. The findings are   consistent with mild stenosis. There was mild regurgitation.   Valve area by continuity equation (using LVOT flow): 1.84 cm^2. - Pericardium, extracardiac: A trivial pericardial effusion was   identified. Left atrium:  The atrium was normal in size.  Assessment & Plan    1. Atrial fib with RVR: Patient with weakness and palpitations during HD today. Converted to NSR with RBBB now. Cardizem was given for about 45 minutes but turned off due to increasing HR.   She is on 152m metoprolol daily. Will keep her at that dose now that she is in NSR. If rapid Afib returns would avoid cardizem as according to ED doc it seemed to make her Afib worse. Blood pressure is elevated, will write for IV metoprolol for elevated HR > 120.   This patients CHA2DS2-VASc Score and unadjusted Ischemic Stroke Rate (% per year) is equal to 4.8 % stroke rate/year from a score of 4 Above score calculated as 1 point each if present [CHF, HTN, DM, Vascular=MI/PAD/Aortic Plaque,  Age if 65-74, or Female], 2 points each if present [Age > 75, or Stroke/TIA/TE]   Would start Coumadin per pharmacy, bridge with heparin. Will need to stop ASA when she starts Coumadin as she is already on Plavix.   Will follow Echo results.     Signed, EArbutus Leas NP 10/22/2016, 7:07 PM Pager: [phone]

## 2016-10-22 NOTE — ED Provider Notes (Addendum)
[facility] DEPT Provider Note   CSN: [id number] Arrival date & time: 10/22/16  1551     History   Chief Complaint Chief Complaint  Patient presents with  . Atrial Fibrillation    HPI Angela Trujillo is a 83 y.o. female.  Patient is an 83 year old female with a history of end-stage renal disease on dialysis, paroxysmal atrial flutter, recent aortic valve replacement for severe stenosis on 09/29/2016 who currently is taking Plavix presenting today with acute onset of generalized weakness as soon as she finished dialysis. She denies any chest pain, palpitations, shortness of breath, abdominal pain, nausea or vomiting. She states she was fine prior to going today and all the symptoms started when dialysis finished. EMS found patient to be tachycardic. She does take metoprolol daily but was told to hold her metoprolol till after dialysis so she has not taken it today. She did take it yesterday. She denies any other symptoms however looking through notes patient's son called the office yesterday for high heart rate which apparently resolved and they thought was an error.  Currently patient states she feels better and has no complaints.  Patient was seen in the ED 2 days ago when she had similar symptoms 2 hours into dialysis. Everything was found to be within normal limits and she was discharged home. At that time she was in sinus rhythm.   The history is provided by the patient and the EMS personnel.  Weakness  Primary symptoms comment: generalized weakness starting right after dialysis today. This is a recurrent problem. The current episode started less than 1 hour ago. The problem has been resolved. There was no focality noted. There has been no fever. Pertinent negatives include no shortness of breath, no chest pain, no vomiting, no altered mental status, no confusion and no headaches. There were no medications administered prior to arrival. Associated medical issues comments: hx of recent  aortic valve replacement 09/29/16, ESRD on dialysis and PAF on plavix currently..    Past Medical History:  Diagnosis Date  . Atrial flutter ([place])   . Diabetes mellitus with end stage renal disease ([place])   . Dyspnea    with exertion  . ESRD (end stage renal disease) on dialysis ([place])    HD on T,T, Sa  . GERD (gastroesophageal reflux disease)   . Hypertension   . Pneumonia 10/2015  . Severe aortic stenosis 08/19/2016   s/p TAVR 09/2016 // b. Echo 09/30/16: EF 50-55, inf-septal AK, Gr 2 DD, normally functioning TAVR, mean AV gradient 15 mmHg, mod MAC, mild MS, mild MR, trivial effusion    Patient Active Problem List   Diagnosis Date Noted  . S/P TAVR (transcatheter aortic valve replacement) 10/04/2016  . Hypoglycemia 10/04/2016  . ESRD (end stage renal disease) ([place]) 10/04/2016  . Chronic diastolic CHF (congestive heart failure) ([place]) 10/04/2016  . Altered mental status   . Chronic periodontitis 09/23/2016  . Atrial flutter ([place])   . Anemia 04/15/2013  . Acute renal failure ([place]) 04/15/2013  . HTN (hypertension) 04/15/2013  . DM2 (diabetes mellitus, type 2) ([place]) 04/15/2013    Past Surgical History:  Procedure Laterality Date  . ABDOMINAL HYSTERECTOMY    . CARDIAC CATHETERIZATION N/A 08/19/2016   Procedure: Right/Left Heart Cath and Coronary Angiography;  Surgeon: Sherren Mocha, MD;  Location: [facility];  Service: Cardiovascular;  Laterality: N/A;  . COLONOSCOPY    . IR GENERIC HISTORICAL  08/31/2016   IR RADIOLOGY PERIPHERAL GUIDED IV START 08/31/2016 Corrie Mckusick, DO  [facility]  . IR GENERIC HISTORICAL  08/31/2016   IR US GUIDE VASC ACCESS RIGHT 08/31/2016 Corrie Mckusick, DO [facility]  . MULTIPLE EXTRACTIONS WITH ALVEOLOPLASTY N/A 09/25/2016   Procedure: EXTRACTION of tooth number 31 WITH ALVEOLOPLASTY AND GROSS DEBRIDEMENT OF REMAINING TEETH;  Surgeon: Lenn Cal, DDS;  Location: [facility];  Service: Oral Surgery;  Laterality: N/A;  . TEE WITHOUT  CARDIOVERSION N/A 09/29/2016   Procedure: TRANSESOPHAGEAL ECHOCARDIOGRAM (TEE);  Surgeon: Sherren Mocha, MD;  Location: [facility];  Service: Open Heart Surgery;  Laterality: N/A;  . TRANSCATHETER AORTIC VALVE REPLACEMENT, TRANSFEMORAL N/A 09/29/2016   Procedure: TRANSCATHETER AORTIC VALVE REPLACEMENT, TRANSFEMORAL;  Surgeon: Sherren Mocha, MD;  Location: [facility];  Service: Open Heart Surgery;  Laterality: N/A;  . TUBAL LIGATION      OB History    No data available       Home Medications    Prior to Admission medications   Medication Sig Start Date End Date Taking? Authorizing Provider  aspirin EC 81 MG EC tablet Take 1 tablet (81 mg total) by mouth daily. 10/05/16   Liliane Shi, PA-C  atorvastatin (LIPITOR) 20 MG tablet Take 20 mg by mouth at bedtime. 06/04/16   Historical Provider, MD  calcium acetate (PHOSLO) 667 MG capsule Take 667 mg by mouth daily with breakfast.    Historical Provider, MD  chlorhexidine (PERIDEX) 0.12 % solution Rinse with 15 mls twice daily for 30 seconds. Use after breakfast and at bedtime. Spit out excess. Do not swallow. 09/23/16   Lenn Cal, DDS  cloNIDine (CATAPRES) 0.1 MG tablet Take 1 tablet (0.1 mg total) by mouth 2 (two) times daily. 10/04/16   Liliane Shi, PA-C  clopidogrel (PLAVIX) 75 MG tablet Take 1 tablet (75 mg total) by mouth daily. 10/05/16   Liliane Shi, PA-C  famotidine (PEPCID) 20 MG tablet Take 20 mg by mouth 2 (two) times daily.    Historical Provider, MD  hydrALAZINE (APRESOLINE) 50 MG tablet Take 1 tablet (50 mg total) by mouth 3 (three) times daily. 10/04/16   Liliane Shi, PA-C  HYDROcodone-acetaminophen (HYCET) 7.5-325 mg/15 ml solution Take 10-15 mLs by mouth every 6 (six) hours as needed for moderate pain or severe pain. 09/25/16 09/25/17  Lenn Cal, DDS  Insulin Detemir (LEVEMIR FLEXTOUCH) 100 UNIT/ML Pen Inject 14 Units into the skin daily at 10 pm. 10/04/16   Liliane Shi, PA-C  metoprolol succinate (TOPROL-XL) 100 MG 24 hr  tablet Take 100 mg by mouth daily. 06/29/16   Historical Provider, MD  Multiple Vitamins-Minerals (MULTIVITAMIN PO) Take 1 tablet by mouth daily.    Historical Provider, MD  multivitamin (RENA-VIT) TABS tablet Take 1 tablet by mouth at bedtime. 10/04/16   Liliane Shi, PA-C  SENSIPAR 30 MG tablet Take 30 mg by mouth Every Tuesday,Thursday,and Saturday with dialysis.  08/06/16   Historical Provider, MD    Family History Family History  Problem Relation Age of Onset  . CAD Neg Hx     Social History Social History  Substance Use Topics  . Smoking status: Never Smoker  . Smokeless tobacco: Never Used  . Alcohol use No     Allergies   Asa [aspirin]; Codeine; and Penicillins   Review of Systems Review of Systems  Respiratory: Negative for shortness of breath.   Cardiovascular: Negative for chest pain.  Gastrointestinal: Negative for vomiting.  Neurological: Positive for weakness. Negative for headaches.  Psychiatric/Behavioral: Negative for confusion.  All other  systems reviewed and are negative.    Physical Exam Updated Vital Signs BP (!) 160/107 (BP Location: Left Arm)   Pulse (!) 130   Temp 99.5 F (37.5 C) (Oral)   Resp 17   Wt 147 lb (66.7 kg)   SpO2 97%   BMI 24.46 kg/m   Physical Exam  Constitutional: She is oriented to person, place, and time. She appears well-developed and well-nourished. No distress.  HENT:  Head: Normocephalic and atraumatic.  Mouth/Throat: Oropharynx is clear and moist.  Eyes: Conjunctivae and EOM are normal. Pupils are equal, round, and reactive to light.  Neck: Normal range of motion. Neck supple.  Cardiovascular: Intact distal pulses.  An irregularly irregular rhythm present. Tachycardia present.   Murmur heard.  Systolic murmur is present  Systolic click  Pulmonary/Chest: Effort normal and breath sounds normal. No respiratory distress. She has no wheezes. She has no rales.  Abdominal: Soft. She exhibits no distension. There is no  tenderness. There is no rebound and no guarding.  Musculoskeletal: Normal range of motion. She exhibits no edema or tenderness.  Fistula accessed in the RUE.  Thrill palpable.  No lower ext edema or pain  Neurological: She is alert and oriented to person, place, and time. She has normal strength. No sensory deficit.  Skin: Skin is warm and dry. No rash noted. No erythema.  Psychiatric: She has a normal mood and affect. Her behavior is normal.  Nursing note and vitals reviewed.    ED Treatments / Results  Labs (all labs ordered are listed, but only abnormal results are displayed) Labs Reviewed  CBC WITH DIFFERENTIAL/PLATELET - Abnormal; Notable for the following:       Result Value   WBC 13.7 (*)    RBC 2.94 (*)    Hemoglobin 9.0 (*)    HCT 27.1 (*)    Neutro Abs 11.9 (*)    All other components within normal limits  COMPREHENSIVE METABOLIC PANEL - Abnormal; Notable for the following:    Glucose, Bld 116 (*)    Creatinine, Ser 4.46 (*)    Alkaline Phosphatase 139 (*)    Total Bilirubin 1.5 (*)    GFR calc non Af Amer 8 (*)    GFR calc Af Amer 10 (*)    All other components within normal limits  I-STAT TROPOININ, ED - Abnormal; Notable for the following:    Troponin i, poc 0.11 (*)    All other components within normal limits    EKG  EKG Interpretation  Date/Time:  Thursday October 22 2016 15:58:59 EST Ventricular Rate:  133 PR Interval:    QRS Duration: 149 QT Interval:  359 QTC Calculation: 534 R Axis:   -88 Text Interpretation:  recurrent Atrial fibrillation with rapid ventricular response Right bundle branch block Confirmed by Maryan Rued  MD, Loree Fee ([id number]) on 10/22/2016 4:11:29 PM       Radiology Dg Chest Port 1 View  Result Date: 10/22/2016 CLINICAL DATA:  Nausea and vomiting and uncontrolled atrial fibrillation today. Patient has dialysis dependent renal failure. History of hypertension and diabetes. EXAM: PORTABLE CHEST 1 VIEW COMPARISON:  Portable chest x-ray  of October 20, 2016 FINDINGS: The lungs are well-expanded. The interstitial markings are increased. The pulmonary vascularity is engorged. The cardiac silhouette is enlarged. There is calcification in the wall of the aortic arch. IMPRESSION: CHF with mild pulmonary interstitial edema. No alveolar pneumonia nor pleural effusion. Thoracic aortic atherosclerosis. Electronically Signed   By: David  Martinique M.D.   On:  10/22/2016 16:37    Procedures Procedures (including critical care time)  Medications Ordered in ED Medications  metoprolol (LOPRESSOR) injection 5 mg (5 mg Intravenous Given 10/22/16 1616)     Initial Impression / Assessment and Plan / ED Course  I have reviewed the triage vital signs and the nursing notes.  Pertinent labs & imaging results that were available during my care of the patient were reviewed by me and considered in my medical decision making (see chart for details).     Patient with multiple medical problems presenting today in A. fib RVR. She currently has no complaints. She is not chronically in atrial fibrillation she was seen 2 days ago and was in sinus rhythm. She recently has had a valve replacement and is currently taking aspirin and Plavix. She denies any strokelike symptoms, no chest pain or shortness of breath. She has not had any infectious symptoms. Symptoms started when dialysis finished. She has not taken her beta blocker today yet she was told to take it after dialysis finished. Patient is hemodynamically stable at this time. Will give 5 mg of IV Lopressor and check labs to ensure no electrolyte abnormality.  4:58 PM Only minimal improvement with lopressor.  HR 120's.  Will start on cardizem bolus and gtt.  5:48 PM Pt still tachy and will increase to 10 as HR still between 105-130.  Trop mildly elevated most likely from strain.  Will consult cardiology.  6:20 PM Spoke with Dr. Percival Spanish who will see the pt.  After cardizem was increased to 10 pt HR  increased to 160-170.  Cardizem was turned off and in about 3mn pt spontaneously converted to sinus rhythm.  Pt had some intermittent bouts of a.flutter but then remained in sinus rhythm.  She was given po dose of her metoprolol.  Will admit for further care.  CRITICAL CARE Performed by: PBlanchie DessertTotal critical care time: 30 minutes Critical care time was exclusive of separately billable procedures and treating other patients. Critical care was necessary to treat or prevent imminent or life-threatening deterioration. Critical care was time spent personally by me on the following activities: development of treatment plan with patient and/or surrogate as well as nursing, discussions with consultants, evaluation of patient's response to treatment, examination of patient, obtaining history from patient or surrogate, ordering and performing treatments and interventions, ordering and review of laboratory studies, ordering and review of radiographic studies, pulse oximetry and re-evaluation of patient's condition.   CHA2DS2/VAS Stroke Risk Points      8 >= 2 Points: High Risk  1 - 1.99 Points: Medium Risk  0 Points: Low Risk    The previous score was 7 on 10/02/2016.:  Change:         Details    Note: External data might be a factor in metrics not marked with    Points Metrics   This score determines the patient's risk of having a stroke if the  patient has atrial fibrillation.       1 Has Congestive Heart Failure:  Yes   0 Has Vascular Disease:  No   1 Has Hypertension:  Yes   2 Age:  89  1 Has Diabetes:  Yes   2 Had Stroke:  Yes Had TIA:  No Had thromboembolism:  No   1 Female:  Yes          Final Clinical Impressions(s) / ED Diagnoses   Final diagnoses:  Atrial fibrillation with RVR ([place]    New  Prescriptions New Prescriptions   No medications on file     Blanchie Dessert, MD 10/22/16 [place], MD 10/22/16 [place],  MD 10/22/16 [phone]

## 2016-10-22 NOTE — ED Notes (Signed)
Maryan Rued, MD notified of abnormal lab test result

## 2016-10-22 NOTE — ED Triage Notes (Signed)
Pt from dialysis for uncontrolled a-fib that pt only has a history of it since her valve replacement a few weeks ago. Pt states she got sick at dialysis and that's when hey checked her heart rate.

## 2016-10-22 NOTE — Telephone Encounter (Signed)
PER  SON  PT 'S  HEART  RATE  IS  178  PT   IS  HEADING  TO [facility] WANTED  TO LET DR Burt Knack  KNOW .[facility]on[facility] Housekeeper

## 2016-10-22 NOTE — ED Notes (Signed)
Report attempted. Floor RN asked this RN to call back in 10 minutes.

## 2016-10-22 NOTE — Progress Notes (Addendum)
ANTICOAGULATION CONSULT NOTE - Initial Consult  Pharmacy Consult for heparin Indication: atrial fibrillation  Allergies  Allergen Reactions  . Asa [Aspirin] Nausea Only and Other (See Comments)    MAKES STOMACH HURT  . Codeine Rash  . Penicillins Rash     Has patient had a PCN reaction causing immediate rash, facial/tongue/throat swelling, SOB or lightheadedness with hypotension:  #  #  #  NO  #  #  #  Has patient had a PCN reaction causing severe rash involving mucus membranes or skin necrosis:  #  #  #  NO  #  #  # Has patient had a PCN reaction that required hospitalization:  #  #  #  NO  #  #  #  Has patient had a PCN reaction occurring within the last 10 years:  #  #  #  NO  #  #  #  If all answers are "NO", may proceed with Cephalosporin    Patient Measurements: Weight: 147 lb (66.7 kg) Heparin Dosing Weight: 66.7 kg  Vital Signs: Temp: 99.5 F (37.5 C) (02/01 1604) Temp Source: Oral (02/01 1604) BP: 169/78 (02/01 1900) Pulse Rate: 84 (02/01 1900)  Labs:  Recent Labs  10/20/16 1608 10/20/16 1659 10/22/16 1610  HGB 9.7* 10.2* 9.0*  HCT 29.2* 30.0* 27.1*  PLT 88*  --  110*  APTT 33  --   --   LABPROT 13.9  --   --   INR 1.07  --   --   CREATININE 6.55* 6.80* 4.46*    Estimated Creatinine Clearance: 8.6 mL/min (by C-G formula based on SCr of 4.46 mg/dL (H)).   Medical History: Past Medical History:  Diagnosis Date  . Atrial flutter ([place])   . Diabetes mellitus with end stage renal disease ([place])   . Dyspnea    with exertion  . ESRD (end stage renal disease) on dialysis ([place])    HD on T,T, Sa  . GERD (gastroesophageal reflux disease)   . Hypertension   . Pneumonia 10/2015  . Severe aortic stenosis 08/19/2016   s/p TAVR 09/2016 // b. Echo 09/30/16: EF 50-55, inf-septal AK, Gr 2 DD, normally functioning TAVR, mean AV gradient 15 mmHg, mod MAC, mild MS, mild MR, trivial effusion    Assessment: 38 yoF presented with AFib and RVR after dialysis today. Pt is  s/p TAVR 09/2016. Pt is on no oral anticoagulants according to notes and PTA medication list. Pt has now converted to NSR w/ RBBB.   Goal of Therapy:  Heparin level 0.3-0.7 Monitor platelets by anticoagulation protocol: Yes   Plan:  -Heparin 3000 units x1 -Heparin 950 units/hr -Monitor 8-hr heparin level -Daily CBC, heparin level, S/Sx bleeding -F/U long term [facility] plans  Arrie Senate, PharmD PGY-1 Pharmacy Resident Pager: [phone] 10/22/2016

## 2016-10-22 NOTE — H&P (Signed)
History and Physical    Angela Trujillo OJJ:[id number] DOB: 30-May-1931 DOA: 10/22/2016  PCP: Doretha Sou, MD   Patient coming from: Home  Chief Complaint: Gen weakness, fatigue, rapid HR   HPI: Angela Trujillo is a 83 y.o. female with medical history significant for insulin-dependent diabetes mellitus, hypertension, end-stage renal disease on hemodialysis, and severe aortic stenosis status post TAVR on 09/29/2016, now presenting to the ED for evaluation of generalized weakness, fatigue, and intermittently rapid heart rate. Patient has history of atrial flutter but has not been anticoagulated for that. Since the patient was discharged from the hospital following her TAVR there has been recurrent transient episodes of generalized weakness and fatigue and the patient is noted to have rapid heart rate during these episodes. She was seen in the emergency department 2 days ago, sent from dialysis for a heart rate in the 170s, but was in a sinus rhythm with normal rate upon arrival. She has had recurrent episodes since that time and her son has checked her pulse rate at home, finding it to fluctuate from 70s to 160s. She had one of these episodes this afternoon and was brought into the ED for evaluation.  ED Course: Upon arrival to the ED, patient is found to be afebrile, saturating well on room air, tachycardic to 1:30, and hypertensive to 160/107. EKG features atrial fibrillation with RVR, rate 133, and chronic right bundle branch block. Chest x-ray is notable for CHF with pulmonary interstitial edema, but no pneumonia or effusions. Chemistry panel features a serum creatinine of 4.46, normal potassium, and normal BUN. CBC features a new leukocytosis to 13,700 with a stable normocytic anemia and stable thrombocytopenia. Troponin is mildly elevated to 0.11. Patient was given a 5 mg IV push of Lopressor without any effect on the heart rate. She was then given an IV push of diltiazem and started on  diltiazem infusion. This seemed to make the right worse, shooting up to the 170s, and the infusion was stopped. Within several minutes, the patient spontaneously converted back to sinus with a normal rate. Cardiology was consulted by the ED physician and has evaluated the patient in the emergency department. Patient has been started on heparin infusion and will be observed on the telemetry unit for ongoing evaluation and management of atrial fibrillation with RVR.  Review of Systems:  All other systems reviewed and apart from HPI, are negative.  Past Medical History:  Diagnosis Date  . Atrial flutter ([place])   . Diabetes mellitus with end stage renal disease ([place])   . Dyspnea    with exertion  . ESRD (end stage renal disease) on dialysis ([place])    HD on T,T, Sa  . GERD (gastroesophageal reflux disease)   . Hypertension   . Pneumonia 10/2015  . Severe aortic stenosis 08/19/2016   s/p TAVR 09/2016 // b. Echo 09/30/16: EF 50-55, inf-septal AK, Gr 2 DD, normally functioning TAVR, mean AV gradient 15 mmHg, mod MAC, mild MS, mild MR, trivial effusion    Past Surgical History:  Procedure Laterality Date  . ABDOMINAL HYSTERECTOMY    . CARDIAC CATHETERIZATION N/A 08/19/2016   Procedure: Right/Left Heart Cath and Coronary Angiography;  Surgeon: Sherren Mocha, MD;  Location: [facility];  Service: Cardiovascular;  Laterality: N/A;  . COLONOSCOPY    . IR GENERIC HISTORICAL  08/31/2016   IR RADIOLOGY PERIPHERAL GUIDED IV START 08/31/2016 Corrie Mckusick, DO [facility]  . IR GENERIC HISTORICAL  08/31/2016   IR US GUIDE VASC ACCESS  RIGHT 08/31/2016 Corrie Mckusick, DO [facility]  . MULTIPLE EXTRACTIONS WITH ALVEOLOPLASTY N/A 09/25/2016   Procedure: EXTRACTION of tooth number 31 WITH ALVEOLOPLASTY AND GROSS DEBRIDEMENT OF REMAINING TEETH;  Surgeon: Lenn Cal, DDS;  Location: [facility];  Service: Oral Surgery;  Laterality: N/A;  . TEE WITHOUT CARDIOVERSION N/A 09/29/2016   Procedure:  TRANSESOPHAGEAL ECHOCARDIOGRAM (TEE);  Surgeon: Sherren Mocha, MD;  Location: [facility];  Service: Open Heart Surgery;  Laterality: N/A;  . TRANSCATHETER AORTIC VALVE REPLACEMENT, TRANSFEMORAL N/A 09/29/2016   Procedure: TRANSCATHETER AORTIC VALVE REPLACEMENT, TRANSFEMORAL;  Surgeon: Sherren Mocha, MD;  Location: [facility];  Service: Open Heart Surgery;  Laterality: N/A;  . TUBAL LIGATION       reports that she has never smoked. She has never used smokeless tobacco. She reports that she does not drink alcohol or use drugs.  Allergies  Allergen Reactions  . Asa [Aspirin] Nausea Only and Other (See Comments)    MAKES STOMACH HURT  . Codeine Rash  . Penicillins Rash     Has patient had a PCN reaction causing immediate rash, facial/tongue/throat swelling, SOB or lightheadedness with hypotension:  #  #  #  NO  #  #  #  Has patient had a PCN reaction causing severe rash involving mucus membranes or skin necrosis:  #  #  #  NO  #  #  # Has patient had a PCN reaction that required hospitalization:  #  #  #  NO  #  #  #  Has patient had a PCN reaction occurring within the last 10 years:  #  #  #  NO  #  #  #  If all answers are "NO", may proceed with Cephalosporin    Family History  Problem Relation Age of Onset  . CAD Neg Hx      Prior to Admission medications   Medication Sig Start Date End Date Taking? Authorizing Provider  aspirin EC 81 MG EC tablet Take 1 tablet (81 mg total) by mouth daily. 10/05/16  Yes Scott Joylene Draft, PA-C  atorvastatin (LIPITOR) 20 MG tablet Take 20 mg by mouth at bedtime. 06/04/16  Yes Historical Provider, MD  calcium acetate (PHOSLO) 667 MG capsule Take 667 mg by mouth daily with breakfast.   Yes Historical Provider, MD  cloNIDine (CATAPRES) 0.1 MG tablet Take 1 tablet (0.1 mg total) by mouth 2 (two) times daily. 10/04/16  Yes Liliane Shi, PA-C  clopidogrel (PLAVIX) 75 MG tablet Take 1 tablet (75 mg total) by mouth daily. 10/05/16  Yes Scott Joylene Draft, PA-C  famotidine  (PEPCID) 20 MG tablet Take 20 mg by mouth 2 (two) times daily.   Yes Historical Provider, MD  hydrALAZINE (APRESOLINE) 50 MG tablet Take 1 tablet (50 mg total) by mouth 3 (three) times daily. 10/04/16  Yes Scott Joylene Draft, PA-C  HYDROcodone-acetaminophen (HYCET) 7.5-325 mg/15 ml solution Take 10-15 mLs by mouth every 6 (six) hours as needed for moderate pain or severe pain. 09/25/16 09/25/17 Yes Lenn Cal, DDS  Insulin Detemir (LEVEMIR FLEXTOUCH) 100 UNIT/ML Pen Inject 14 Units into the skin daily at 10 pm. 10/04/16  Yes Liliane Shi, PA-C  metoprolol succinate (TOPROL-XL) 100 MG 24 hr tablet Take 100 mg by mouth daily. 06/29/16  Yes Historical Provider, MD  Multiple Vitamins-Minerals (MULTIVITAMIN PO) Take 1 tablet by mouth daily.   Yes Historical Provider, MD  multivitamin (RENA-VIT) TABS tablet Take 1 tablet by mouth at bedtime. 10/04/16  Yes Liliane Shi, PA-C  SENSIPAR 30 MG tablet Take 30 mg by mouth Every Tuesday,Thursday,and Saturday with dialysis.  08/06/16  Yes Historical Provider, MD  chlorhexidine (PERIDEX) 0.12 % solution Rinse with 15 mls twice daily for 30 seconds. Use after breakfast and at bedtime. Spit out excess. Do not swallow. 09/23/16   Lenn Cal, DDS    Physical Exam: Vitals:   10/22/16 1845 10/22/16 1900 10/22/16 1930 10/22/16 2043  BP: 171/87 169/78 162/71 (!) 151/60  Pulse: 85 84 73 72  Resp: _0 Temp:    98.3 F (36.8 C)  TempSrc:    Oral  SpO2: 98% 98% 99% 99%  Weight:    65.3 kg (144 lb)  Height:    _1  (1.651 m)      Constitutional: No acute distress, calm, comfortable  Eyes: PERTLA, lids and conjunctivae normal ENMT: Mucous membranes are moist. Posterior pharynx clear of any exudate or lesions.   Neck: normal, supple, no masses, no thyromegaly Respiratory: Crackles in bilateral bases and mid-lung zones. Normal respiratory effort. No accessory muscle use.  Cardiovascular: Rate ~80 and regular, soft systolic murmur at apex. No extremity  edema.  Abdomen: No distension, no tenderness, no masses palpated. Bowel sounds normal.  Musculoskeletal: no clubbing / cyanosis. No joint deformity upper and lower extremities. Normal muscle tone.  Skin: no significant rashes, lesions, ulcers. Warm, dry, well-perfused. Neurologic: CN 2-12 grossly intact. Sensation intact, DTR normal. Strength 5/5 in all 4 limbs.  Psychiatric: Normal judgment and insight. Alert and oriented x 3. Normal mood and affect.     Labs on Admission: I have personally reviewed following labs and imaging studies  CBC:  Recent Labs Lab 10/20/16 1608 10/20/16 1659 10/22/16 1610  WBC 7.4  --  13.7*  NEUTROABS  --   --  11.9*  HGB 9.7* 10.2* 9.0*  HCT 29.2* 30.0* 27.1*  MCV 91.5  --  92.2  PLT 88*  --  408*   Basic Metabolic Panel:  Recent Labs Lab 10/20/16 1608 10/20/16 1659 10/22/16 1610  NA 141 140 138  K 4.2 4.1 3.8  CL 103 101 101  CO2 26  --  23  GLUCOSE 202* 200* 116*  BUN 31* 31* 17  CREATININE 6.55* 6.80* 4.46*  CALCIUM 8.8*  --  9.0   GFR: Estimated Creatinine Clearance: 8.6 mL/min (by C-G formula based on SCr of 4.46 mg/dL (H)). Liver Function Tests:  Recent Labs Lab 10/20/16 1608 10/22/16 1610  AST 31 33  ALT 20 20  ALKPHOS 133* 139*  BILITOT 0.9 1.5*  PROT 7.2 7.4  ALBUMIN 3.5 3.6   No results for input(s): LIPASE, AMYLASE in the last 168 hours. No results for input(s): AMMONIA in the last 168 hours. Coagulation Profile:  Recent Labs Lab 10/20/16 1608  INR 1.07   Cardiac Enzymes: No results for input(s): CKTOTAL, CKMB, CKMBINDEX, TROPONINI in the last 168 hours. BNP (last 3 results) No results for input(s): PROBNP in the last 8760 hours. HbA1C: No results for input(s): HGBA1C in the last 72 hours. CBG: No results for input(s): GLUCAP in the last 168 hours. Lipid Profile: No results for input(s): CHOL, HDL, LDLCALC, TRIG, CHOLHDL, LDLDIRECT in the last 72 hours. Thyroid Function Tests: No results for input(s):  TSH, T4TOTAL, FREET4, T3FREE, THYROIDAB in the last 72 hours. Anemia Panel: No results for input(s): VITAMINB12, FOLATE, FERRITIN, TIBC, IRON, RETICCTPCT in the last 72 hours. Urine analysis:    Component Value Date/Time  COLORURINE YELLOW 04/14/2013 [place] 04/14/2013 2226   LABSPEC 1.009 04/14/2013 2226   PHURINE 6.0 04/14/2013 2226   GLUCOSEU NEGATIVE 04/14/2013 2226   HGBUR TRACE (A) 04/14/2013 2226   BILIRUBINUR NEGATIVE 04/14/2013 2226   KETONESUR NEGATIVE 04/14/2013 2226   PROTEINUR 100 (A) 04/14/2013 2226   UROBILINOGEN 0.2 04/14/2013 2226   NITRITE NEGATIVE 04/14/2013 2226   LEUKOCYTESUR NEGATIVE 04/14/2013 2226   Sepsis Labs: _0 (procalcitonin:4,lacticidven:4) )No results found for this or any previous visit (from the past 240 hour(s)).   Radiological Exams on Admission: Dg Chest Port 1 View  Result Date: 10/22/2016 CLINICAL DATA:  Nausea and vomiting and uncontrolled atrial fibrillation today. Patient has dialysis dependent renal failure. History of hypertension and diabetes. EXAM: PORTABLE CHEST 1 VIEW COMPARISON:  Portable chest x-ray of October 20, 2016 FINDINGS: The lungs are well-expanded. The interstitial markings are increased. The pulmonary vascularity is engorged. The cardiac silhouette is enlarged. There is calcification in the wall of the aortic arch. IMPRESSION: CHF with mild pulmonary interstitial edema. No alveolar pneumonia nor pleural effusion. Thoracic aortic atherosclerosis. Electronically Signed   By: David  Martinique M.D.   On: 10/22/2016 16:37    EKG: Independently reviewed. Atrial fibrillation with RVR (rate 133), chronic RBBB  Assessment/Plan  1. Atrial fibrillation with RVR  - Has had recurrent episodes of a fib with RVR in recent days, converting back to sinus   - Was given Lopressor 5 mg IVP without apparent response, her home-dose Lopressor 100 mg, then diltiazem IVP and infusion - Shortly into the dilt infusion, HR  increased to 170's and sustained there; diltiazem was stopped and she converted to sinus within a few minutes  - CHADS-VASc at least 5 (age x2, gender, DM, HTN) - Cardiology is consulting and much appreciated; given left-atrial enlargement and valvular disease, she is not  expected to remain in sinus  - She was started on heparin infusion, warfarin, amiodarone 200 mg BID, beta-blocker continued  - Continue Plavix and stop ASA per cardiology   2. ESRD on HD  - Dialyzes TTS  - Potassium and BUN okay on admission; mild pulmonary edema noted, with rapid-rate likely contributing to that - She is anticipated for discharge tomorrow, but may need HD 2/3 is she remains in hospital  - Continue binders, renal diet with fluid-restrictions   3. Insulin-dependent DM - A1c was 8.5% January 2018 - Managed at home with Levemir 14 units qHS  - Check CBG with meals and qHS  - Continue with Levemir 10 units qHS, plus a low-intensity SSI   4. Normocytic anemia, thrombocytopenia  - Hgb 9.0 on admission, stable relative to priors in 9-10 range  - Platelets stable at 110,000; follow closely on heparin  - No endoscopy reports on file - Check anemia panel, continue Pepcid   - Follow CBC's while starting anticoagulation   5. Hypertension  - BP elevated on admission  - Continue clonidine, Toprol, hydralazine    6. Elevated troponin - Likely secondary to atrial fib with rapid rate - No anginal complaints  - She is on heparin infusion  - Trend troponin, monitor on telemetry, repeat EKG, continue ASA, Toprol, Lipitor    DVT prophylaxis: IV heparin infusion Code Status: Full  Family Communication: Two sons updated at bedside with pt's permission Disposition Plan: Observe on telemetry Consults called: Cardiology Admission status: Observation    Vianne Bulls, MD [facility] Pager [phone]  If 7PM-7AM, please contact night-coverage [email or web address] Password [facility]  10/22/2016,  8:58 PM

## 2016-10-22 NOTE — Progress Notes (Signed)
ANTICOAGULATION CONSULT NOTE - Initial Consult  Pharmacy Consult for heparin Indication: atrial fibrillation  Allergies  Allergen Reactions  . Asa [Aspirin] Nausea Only and Other (See Comments)    MAKES STOMACH HURT  . Codeine Rash  . Penicillins Rash     Has patient had a PCN reaction causing immediate rash, facial/tongue/throat swelling, SOB or lightheadedness with hypotension:  #  #  #  NO  #  #  #  Has patient had a PCN reaction causing severe rash involving mucus membranes or skin necrosis:  #  #  #  NO  #  #  # Has patient had a PCN reaction that required hospitalization:  #  #  #  NO  #  #  #  Has patient had a PCN reaction occurring within the last 10 years:  #  #  #  NO  #  #  #  If all answers are "NO", may proceed with Cephalosporin    Patient Measurements: Height: _0  (165.1 cm) Weight: 144 lb (65.3 kg) IBW/kg (Calculated) : 57 Heparin Dosing Weight: 66.7 kg  Vital Signs: Temp: 98.3 F (36.8 C) (02/01 2043) Temp Source: Oral (02/01 2043) BP: 151/60 (02/01 2043) Pulse Rate: 72 (02/01 2043)  Labs:  Recent Labs  10/20/16 1608 10/20/16 1659 10/22/16 1610  HGB 9.7* 10.2* 9.0*  HCT 29.2* 30.0* 27.1*  PLT 88*  --  110*  APTT 33  --   --   LABPROT 13.9  --   --   INR 1.07  --   --   CREATININE 6.55* 6.80* 4.46*    Estimated Creatinine Clearance: 8.6 mL/min (by C-G formula based on SCr of 4.46 mg/dL (H)).   Medical History: Past Medical History:  Diagnosis Date  . Atrial flutter ([place])   . Diabetes mellitus with end stage renal disease ([place])   . Dyspnea    with exertion  . ESRD (end stage renal disease) on dialysis ([place])    HD on T,T, Sa  . GERD (gastroesophageal reflux disease)   . Hypertension   . Pneumonia 10/2015  . Severe aortic stenosis 08/19/2016   s/p TAVR 09/2016 // b. Echo 09/30/16: EF 50-55, inf-septal AK, Gr 2 DD, normally functioning TAVR, mean AV gradient 15 mmHg, mod MAC, mild MS, mild MR, trivial effusion    Assessment: 30 yoF  presented with AFib and RVR after dialysis today. Pt is s/p TAVR 09/2016. Pt is on no oral anticoagulants according to notes and PTA medication list. Pt has now converted to NSR w/ RBBB.   Goal of Therapy:  Heparin level 0.3-0.7 Monitor platelets by anticoagulation protocol: Yes   Plan:  -Heparin 3000 units x1 -Heparin 950 units/hr -Monitor 8-hr heparin level -Daily CBC, heparin level, S/Sx bleeding -F/U long term Wall plans  __________________________________ Addendum:  Adding warfarin 5 mg x 1 Daily INR  Levester Fresh, PharmD, BCPS, BCCCP Clinical Pharmacist 10/22/2016 9:00 PM

## 2016-10-22 NOTE — ED Notes (Signed)
Patients HR was 170 after titration of cardizem. spoke with Dr. Maryan Rued and she advised to pause cardizem for 5 minutes to see what happens. Patient converted around the 5 minute mark. She advised to apply Zoll pads and stop cardizem. Pads applied and cardizem stopped

## 2016-10-22 NOTE — Telephone Encounter (Signed)
New Message  Pt son call requesting to speak with RN. He wants to inform Dr. Burt Knack and RN pt is being taken to the hospital. Please call back to discuss

## 2016-10-23 ENCOUNTER — Ambulatory Visit (HOSPITAL_COMMUNITY): Payer: Medicare Other

## 2016-10-23 ENCOUNTER — Ambulatory Visit: Payer: Self-pay | Admitting: Cardiovascular Disease

## 2016-10-23 DIAGNOSIS — I132 Hypertensive heart and chronic kidney disease with heart failure and with stage 5 chronic kidney disease, or end stage renal disease: Secondary | ICD-10-CM | POA: Diagnosis not present

## 2016-10-23 DIAGNOSIS — Z5181 Encounter for therapeutic drug level monitoring: Secondary | ICD-10-CM | POA: Insufficient documentation

## 2016-10-23 DIAGNOSIS — N186 End stage renal disease: Secondary | ICD-10-CM | POA: Diagnosis not present

## 2016-10-23 DIAGNOSIS — Z952 Presence of prosthetic heart valve: Secondary | ICD-10-CM | POA: Diagnosis not present

## 2016-10-23 DIAGNOSIS — E1122 Type 2 diabetes mellitus with diabetic chronic kidney disease: Secondary | ICD-10-CM | POA: Diagnosis not present

## 2016-10-23 DIAGNOSIS — I4891 Unspecified atrial fibrillation: Secondary | ICD-10-CM | POA: Diagnosis not present

## 2016-10-23 DIAGNOSIS — I5032 Chronic diastolic (congestive) heart failure: Secondary | ICD-10-CM | POA: Diagnosis not present

## 2016-10-23 DIAGNOSIS — I480 Paroxysmal atrial fibrillation: Secondary | ICD-10-CM | POA: Diagnosis not present

## 2016-10-23 LAB — CBC
HCT: 26 % — ABNORMAL LOW (ref 36.0–46.0)
HEMOGLOBIN: 8.5 g/dL — AB (ref 12.0–15.0)
MCH: 30.4 pg (ref 26.0–34.0)
MCHC: 32.7 g/dL (ref 30.0–36.0)
MCV: 92.9 fL (ref 78.0–100.0)
PLATELETS: 114 10*3/uL — AB (ref 150–400)
RBC: 2.8 MIL/uL — AB (ref 3.87–5.11)
RDW: 14.7 % (ref 11.5–15.5)
WBC: 9.8 10*3/uL (ref 4.0–10.5)

## 2016-10-23 LAB — PROTIME-INR
INR: 1.31
PROTHROMBIN TIME: 16.4 s — AB (ref 11.4–15.2)

## 2016-10-23 LAB — TROPONIN I
TROPONIN I: 0.2 ng/mL — AB (ref <0.03)
Troponin I: 0.26 ng/mL (ref <0.03)

## 2016-10-23 LAB — BASIC METABOLIC PANEL
Anion gap: 12 (ref 5–15)
BUN: 29 mg/dL — ABNORMAL HIGH (ref 6–20)
CALCIUM: 8.8 mg/dL — AB (ref 8.9–10.3)
CO2: 24 mmol/L (ref 22–32)
Chloride: 101 mmol/L (ref 101–111)
Creatinine, Ser: 6.21 mg/dL — ABNORMAL HIGH (ref 0.44–1.00)
GFR, EST AFRICAN AMERICAN: 6 mL/min — AB (ref >60)
GFR, EST NON AFRICAN AMERICAN: 6 mL/min — AB (ref >60)
Glucose, Bld: 109 mg/dL — ABNORMAL HIGH (ref 65–99)
Potassium: 4.4 mmol/L (ref 3.5–5.1)
SODIUM: 137 mmol/L (ref 135–145)

## 2016-10-23 LAB — FOLATE: Folate: 31.8 ng/mL (ref >5.9)

## 2016-10-23 LAB — GLUCOSE, CAPILLARY
Glucose-Capillary: 85 mg/dL (ref 65–99)
Glucose-Capillary: 101 mg/dL — ABNORMAL HIGH (ref 65–99)

## 2016-10-23 LAB — HEPARIN LEVEL (UNFRACTIONATED): Heparin Unfractionated: 0.18 IU/mL — ABNORMAL LOW (ref 0.30–0.70)

## 2016-10-23 MED ORDER — WARFARIN SODIUM 2.5 MG PO TABS: 2.5000 mg | ORAL_TABLET | Freq: Every day | ORAL | 0 refills | Status: DC

## 2016-10-23 MED ORDER — PATIENT'S GUIDE TO USING COUMADIN BOOK
Freq: Once | Status: AC
  Administered 2016-10-23: 15:00:00
  Filled 2016-10-23: qty 1

## 2016-10-23 MED ORDER — WARFARIN SODIUM 2.5 MG PO TABS: 2.5000 mg | ORAL_TABLET | Freq: Every day | ORAL | Status: DC

## 2016-10-23 MED ORDER — AMIODARONE HCL 200 MG PO TABS: 200.0000 mg | ORAL_TABLET | Freq: Two times a day (BID) | ORAL | 0 refills | Status: DC

## 2016-10-23 MED ORDER — WARFARIN VIDEO
Freq: Once | Status: AC
  Administered 2016-10-23: 15:00:00

## 2016-10-23 MED ORDER — ASPIRIN EC 81 MG PO TBEC
81.0000 mg | DELAYED_RELEASE_TABLET | Freq: Every day | ORAL | Status: DC
  Administered 2016-10-23: 81 mg via ORAL
  Filled 2016-10-23: qty 1

## 2016-10-23 NOTE — Telephone Encounter (Signed)
New Message   Pt son calling asking that you give him a call ASAP. Pt son did not disclose any information when asked for the message to send to the nurse.

## 2016-10-23 NOTE — Progress Notes (Signed)
Progress Note  Patient Name: Angela Trujillo Date of Encounter: 10/23/2016  Primary Cardiologist: Dr. Burt Knack  Subjective   Fatigued this morning, no chest pain.   Inpatient Medications    Scheduled Meds: . amiodarone  200 mg Oral BID  . atorvastatin  20 mg Oral QHS  . calcium acetate  667 mg Oral Q breakfast  . [START ON 10/24/2016] cinacalcet  30 mg Oral Q T,Th,Sa-HD  . cloNIDine  0.1 mg Oral BID  . clopidogrel  75 mg Oral Daily  . famotidine  20 mg Oral Daily  . hydrALAZINE  50 mg Oral TID  . insulin aspart  0-5 Units Subcutaneous QHS  . insulin aspart  0-9 Units Subcutaneous TID WC  . insulin detemir  10 Units Subcutaneous QHS  . metoprolol succinate  100 mg Oral Daily  . multivitamin  1 tablet Oral QHS  . Warfarin - Pharmacist Dosing Inpatient   Does not apply q1800   Continuous Infusions: . heparin 1,150 Units/hr (10/23/16 0552)   PRN Meds: acetaminophen, HYDROcodone-acetaminophen, metoprolol, ondansetron (ZOFRAN) IV   Vital Signs    Vitals:   10/22/16 1930 10/22/16 2043 10/23/16 0525 10/23/16 0820  BP: 162/71 (!) 151/60 (!) 158/64 (!) 162/60  Pulse: 73 72 72 74  Resp: 25 18 17    Temp:  98.3 F (36.8 C) 98.2 F (36.8 C) 98.2 F (36.8 C)  TempSrc:  Oral Oral   SpO2: 99% 99% 100% 100%  Weight:  144 lb (65.3 kg)    Height:  5\' 5"  (1.651 m)     No intake or output data in the 24 hours ending 10/23/16 1039 Filed Weights   10/22/16 1604 10/22/16 2043  Weight: 147 lb (66.7 kg) 144 lb (65.3 kg)    Telemetry    SR with intermittent episodes of SVT aberrancy?- Personally Reviewed  ECG    SR with RBBB, 1st degree AVB, LAFB - Personally Reviewed  Physical Exam   GEN: No acute distress. Fatigued, but wakes up to answer questions.  Neck: No JVD Cardiac: tachy, no murmurs, rubs, or gallops.  Respiratory: Clear to auscultation bilaterally. GI: Soft, nontender, non-distended  MS: No edema; No deformity. Neuro:  Nonfocal  Psych: Normal affect   Labs      Chemistry Recent Labs Lab 10/20/16 1608 10/20/16 1659 10/22/16 1610  NA 141 140 138  K 4.2 4.1 3.8  CL 103 101 101  CO2 26  --  23  GLUCOSE 202* 200* 116*  BUN 31* 31* 17  CREATININE 6.55* 6.80* 4.46*  CALCIUM 8.8*  --  9.0  PROT 7.2  --  7.4  ALBUMIN 3.5  --  3.6  AST 31  --  33  ALT 20  --  20  ALKPHOS 133*  --  139*  BILITOT 0.9  --  1.5*  GFRNONAA 5*  --  8*  GFRAA 6*  --  10*  ANIONGAP 12  --  14     Hematology Recent Labs Lab 10/20/16 1608 10/20/16 1659 10/22/16 1610 10/22/16 2117 10/23/16 0409  WBC 7.4  --  13.7*  --  9.8  RBC 3.19*  --  2.94* 2.90* 2.80*  HGB 9.7* 10.2* 9.0*  --  8.5*  HCT 29.2* 30.0* 27.1*  --  26.0*  MCV 91.5  --  92.2  --  92.9  MCH 30.4  --  30.6  --  30.4  MCHC 33.2  --  33.2  --  32.7  RDW 14.5  --  15.1  --  14.7  PLT 88*  --  110*  --  114*    Cardiac Enzymes Recent Labs Lab 10/22/16 2117 10/23/16 0409  TROPONINI 0.20* 0.26*    Recent Labs Lab 10/20/16 1657 10/22/16 1652  TROPIPOC 0.02 0.11*     BNPNo results for input(s): BNP, PROBNP in the last 168 hours.   DDimer No results for input(s): DDIMER in the last 168 hours.   Radiology    Dg Chest Port 1 View  Result Date: 10/22/2016 CLINICAL DATA:  Nausea and vomiting and uncontrolled atrial fibrillation today. Patient has dialysis dependent renal failure. History of hypertension and diabetes. EXAM: PORTABLE CHEST 1 VIEW COMPARISON:  Portable chest x-ray of October 20, 2016 FINDINGS: The lungs are well-expanded. The interstitial markings are increased. The pulmonary vascularity is engorged. The cardiac silhouette is enlarged. There is calcification in the wall of the aortic arch. IMPRESSION: CHF with mild pulmonary interstitial edema. No alveolar pneumonia nor pleural effusion. Thoracic aortic atherosclerosis. Electronically Signed   By: David  Martinique M.D.   On: 10/22/2016 16:37    Cardiac Studies   None  Patient Profile     83 y.o. female ESRD on HD, severe AS  s/p TAVR in Jan 2018. She presented with atrial fib with RVR with rates as high as 170, cardiology consulted.  Assessment & Plan    1. AF RVR: Presented to the ED with weakness and fatigue, found to be in AF RVR, though has a hx of Atrial Flutter. Placed on IV dilt briefly and converted to SR. Started on Amio 200mg  BID yesterday.  -- Continued on Toprol XL 100mg . Blood pressure is elevated, room to uptitrate -- Telemetry showed continued episodes of SVT, but seems to have slowed with only one episode this morning.  -- started on coumadin with IV heparin bridge. Anticoagulation discussed with Dr. Burt Knack, plan for ASA and coumadin, stop plavix. No INR yet this morning. -- ChadsVasc 4  2. Severe AS s/p TAVR: Last echo showed stable valve.   3. Anemia: Hgb 9.0>>8.5 today. No reports of active bleeding. Does have ESRD. May need to pursue GI work up given the need for [facility] now.   4. ESRD on HD: Had her normal dialysis session yesterday.   Signed, Reino Bellis, NP  10/23/2016, 10:39 AM

## 2016-10-23 NOTE — Progress Notes (Signed)
Patient had 20 beat run of SVT. Patient asymptomatic. MD made aware. Will continue to monitor

## 2016-10-23 NOTE — Progress Notes (Signed)
[place] for coumadin Indication: atrial fibrillation  Allergies  Allergen Reactions  . Asa [Aspirin] Nausea Only and Other (See Comments)    MAKES STOMACH HURT  . Codeine Rash  . Penicillins Rash     Has patient had a PCN reaction causing immediate rash, facial/tongue/throat swelling, SOB or lightheadedness with hypotension:  #  #  #  NO  #  #  #  Has patient had a PCN reaction causing severe rash involving mucus membranes or skin necrosis:  #  #  #  NO  #  #  # Has patient had a PCN reaction that required hospitalization:  #  #  #  NO  #  #  #  Has patient had a PCN reaction occurring within the last 10 years:  #  #  #  NO  #  #  #  If all answers are "NO", may proceed with Cephalosporin    Patient Measurements: Height: 5\' 5"  (165.1 cm) Weight: 144 lb (65.3 kg) IBW/kg (Calculated) : 57 Heparin Dosing Weight: 66.7 kg  Vital Signs: Temp: 98.2 F (36.8 C) (02/02 0820) Temp Source: Oral (02/02 0525) BP: 162/60 (02/02 0820) Pulse Rate: 74 (02/02 0820)  Labs:  Recent Labs  10/20/16 1608 10/20/16 1659 10/22/16 1610 10/22/16 2117 10/23/16 0409  HGB 9.7* 10.2* 9.0*  --  8.5*  HCT 29.2* 30.0* 27.1*  --  26.0*  PLT 88*  --  110*  --  114*  APTT 33  --   --   --   --   LABPROT 13.9  --   --   --   --   INR 1.07  --   --   --   --   HEPARINUNFRC  --   --   --   --  0.18*  CREATININE 6.55* 6.80* 4.46*  --   --   TROPONINI  --   --   --  0.20* 0.26*    Estimated Creatinine Clearance: 8.6 mL/min (by C-G formula based on SCr of 4.46 mg/dL (H)).  Assessment: 83 y.o. female with Afib on new coumadin and new amiodarone 200 mg po BID which interacts with coumadin. INR ordered for today but not done.   Goal of Therapy: INR 2-3  Plan: dc heparin drip, no bridge necessary Coumadin book and video for education Coumadin 2.5 mg daily INR daily  Eudelia Bunch, Pharm.D. BP:[id number] 10/23/2016 11:54 AM

## 2016-10-23 NOTE — Progress Notes (Signed)
[place] for heparin Indication: atrial fibrillation  Allergies  Allergen Reactions  . Asa [Aspirin] Nausea Only and Other (See Comments)    MAKES STOMACH HURT  . Codeine Rash  . Penicillins Rash     Has patient had a PCN reaction causing immediate rash, facial/tongue/throat swelling, SOB or lightheadedness with hypotension:  #  #  #  NO  #  #  #  Has patient had a PCN reaction causing severe rash involving mucus membranes or skin necrosis:  #  #  #  NO  #  #  # Has patient had a PCN reaction that required hospitalization:  #  #  #  NO  #  #  #  Has patient had a PCN reaction occurring within the last 10 years:  #  #  #  NO  #  #  #  If all answers are "NO", may proceed with Cephalosporin    Patient Measurements: Height: 5\' 5"  (165.1 cm) Weight: 144 lb (65.3 kg) IBW/kg (Calculated) : 57 Heparin Dosing Weight: 66.7 kg  Vital Signs: Temp: 98.3 F (36.8 C) (02/01 2043) Temp Source: Oral (02/01 2043) BP: 151/60 (02/01 2043) Pulse Rate: 72 (02/01 2043)  Labs:  Recent Labs  10/20/16 1608 10/20/16 1659 10/22/16 1610 10/22/16 2117 10/23/16 0409  HGB 9.7* 10.2* 9.0*  --  8.5*  HCT 29.2* 30.0* 27.1*  --  26.0*  PLT 88*  --  110*  --  114*  APTT 33  --   --   --   --   LABPROT 13.9  --   --   --   --   INR 1.07  --   --   --   --   HEPARINUNFRC  --   --   --   --  0.18*  CREATININE 6.55* 6.80* 4.46*  --   --   TROPONINI  --   --   --  0.20*  --     Estimated Creatinine Clearance: 8.6 mL/min (by C-G formula based on SCr of 4.46 mg/dL (H)).  Assessment: 83 y.o. female with Afib for heparin  Goal of Therapy:  Heparin level 0.3-0.7 Monitor platelets by anticoagulation protocol: Yes   Plan:  Increase Heparin 1150 units/hr Check heparin level in 8 hours.  Phillis Knack, PharmD, BCPS

## 2016-10-23 NOTE — Evaluation (Signed)
Physical Therapy Evaluation and Discharge Patient Details Name: Angela Trujillo MRN: [id number] DOB: [birth year] Today's Date: 10/23/2016   History of Present Illness  83 y.o. female with hx of ESRD on HD, admitted for afib with RVR, and severe AS s/p TAVR.  Clinical Impression  Patient evaluated by Physical Therapy with no further acute PT needs identified. All education has been completed and the patient has no further questions. Demonstrates safe basic gait and mobility tactics, ambulating up to 200 feet today with one standing rest break. SpO2 briefly reached 89% on room air during bout but maintained in mid 90s for majority of distance; HR in 80s. See below for any follow-up Physical Therapy or equipment needs. PT is signing off. Thank you for this referral.     Follow Up Recommendations No PT follow up    Equipment Recommendations  None recommended by PT    Recommendations for Other Services       Precautions / Restrictions Precautions Precautions: Fall Restrictions Weight Bearing Restrictions: No      Mobility  Bed Mobility Overal bed mobility: Modified Independent             General bed mobility comments: extra time, no use of rail or assist needed  Transfers Overall transfer level: Modified independent Equipment used: None Transfers: Sit to/from Stand Sit to Stand: Modified independent (Device/Increase time)         General transfer comment: Extra time, good power-up without physical assistance.  Ambulation/Gait Ambulation/Gait assistance: Modified independent (Device/Increase time) Ambulation Distance (Feet): 200 Feet Assistive device: None Gait Pattern/deviations: Step-through pattern;Decreased stride length Gait velocity: decreased   General Gait Details: Overall, ambulating well without overt instability noted. HR remained in low 80s during bout. She did require one standing rest break with sats reaching 89% briefely on room air, but remained in mid  90s throughout bout otherwise. Cues for symptom awareness and therapeutic rest breaks if she fatigues with gait.  Stairs            Wheelchair Mobility    Modified Rankin (Stroke Patients Only)       Balance Overall balance assessment: Modified Independent Sitting-balance support: No upper extremity supported Sitting balance-Leahy Scale: Normal     Standing balance support: No upper extremity supported Standing balance-Leahy Scale: Good                               Pertinent Vitals/Pain Pain Assessment: No/denies pain    Home Living Family/patient expects to be discharged to:: Private residence Living Arrangements: Alone Available Help at Discharge: Family;Available PRN/intermittently Type of Home: House Home Access: Level entry     Home Layout: One level Home Equipment: Walker - 2 wheels;Walker - 4 wheels;Tub bench;Cane - single point      Prior Function Level of Independence: Independent with assistive device(s)         Comments: ambulates in the home by reaching out for furniture, ocassional use of cane     Hand Dominance   Dominant Hand: Right    Extremity/Trunk Assessment   Upper Extremity Assessment Upper Extremity Assessment: Defer to OT evaluation    Lower Extremity Assessment Lower Extremity Assessment: Generalized weakness       Communication   Communication: HOH  Cognition Arousal/Alertness: Awake/alert Behavior During Therapy: WFL for tasks assessed/performed Overall Cognitive Status: Within Functional Limits for tasks assessed  General Comments General comments (skin integrity, edema, etc.): Family in room, very supportive.    Exercises     Assessment/Plan    PT Assessment Patent does not need any further PT services  PT Problem List            PT Treatment Interventions      PT Goals (Current goals can be found in the Care Plan section)  Acute Rehab PT Goals Patient Stated  Goal: to go home PT Goal Formulation: All assessment and education complete, DC therapy    Frequency     Barriers to discharge        Co-evaluation               End of Session   Activity Tolerance: Patient tolerated treatment well Patient left: in bed;with call bell/phone within reach;with family/visitor present;Other (comment) (pharmacy in room speaking with patient and family) Nurse Communication: Mobility status    Functional Assessment Tool Used: clinical observation Functional Limitation: Mobility: Walking and moving around Mobility: Walking and Moving Around Current Status [id number]): At least 1 percent but less than 20 percent impaired, limited or restricted Mobility: Walking and Moving Around Goal Status [phone]): At least 1 percent but less than 20 percent impaired, limited or restricted Mobility: Walking and Moving Around Discharge Status [phone]): At least 1 percent but less than 20 percent impaired, limited or restricted    Time: [id number] PT Time Calculation (min) (ACUTE ONLY): 14 min   Charges:   PT Evaluation $PT Eval Low Complexity: 1 Procedure     PT G Codes:   PT G-Codes **NOT FOR INPATIENT CLASS** Functional Assessment Tool Used: clinical observation Functional Limitation: Mobility: Walking and moving around Mobility: Walking and Moving Around Current Status [id number]): At least 1 percent but less than 20 percent impaired, limited or restricted Mobility: Walking and Moving Around Goal Status [phone]): At least 1 percent but less than 20 percent impaired, limited or restricted Mobility: Walking and Moving Around Discharge Status [phone]): At least 1 percent but less than 20 percent impaired, limited or restricted    Angela Trujillo Newer 10/23/2016, 2:09 PM  Angela Trujillo, PT

## 2016-10-23 NOTE — Discharge Instructions (Addendum)
Atrial Fibrillation Introduction Atrial fibrillation is a type of heartbeat that is irregular or fast (rapid). If you have this condition, your heart keeps quivering in a weird (chaotic) way. This condition can make it so your heart cannot pump blood normally. Having this condition gives a person more risk for stroke, heart failure, and other heart problems. There are different types of atrial fibrillation. Talk with your doctor to learn about the type that you have. Follow these instructions at home:  Take over-the-counter and prescription medicines only as told by your doctor.  If your doctor prescribed a blood-thinning medicine, take it exactly as told. Taking too much of it can cause bleeding. If you do not take enough of it, you will not have the protection that you need against stroke and other problems.  Do not use any tobacco products. These include cigarettes, chewing tobacco, and e-cigarettes. If you need help quitting, ask your doctor.  If you have apnea (obstructive sleep apnea), manage it as told by your doctor.  Do not drink alcohol.  Do not drink beverages that have caffeine. These include coffee, soda, and tea.  Maintain a healthy weight. Do not use diet pills unless your doctor says they are safe for you. Diet pills may make heart problems worse.  Follow diet instructions as told by your doctor.  Exercise regularly as told by your doctor.  Keep all follow-up visits as told by your doctor. This is important. Contact a doctor if:  You notice a change in the speed, rhythm, or strength of your heartbeat.  You are taking a blood-thinning medicine and you notice more bruising.  You get tired more easily when you move or exercise. Get help right away if:  You have pain in your chest or your belly (abdomen).  You have sweating or weakness.  You feel sick to your stomach (nauseous).  You notice blood in your throw up (vomit), poop (stool), or pee (urine).  You are  short of breath.  You suddenly have swollen feet and ankles.  You feel dizzy.  Your suddenly get weak or numb in your face, arms, or legs, especially if it happens on one side of your body.  You have trouble talking, trouble understanding, or both.  Your face or your eyelid droops on one side. These symptoms may be an emergency. Do not wait to see if the symptoms will go away. Get medical help right away. Call your local emergency services (911 in the [place] Do not drive yourself to the hospital.  This information is not intended to replace advice given to you by your health care provider. Make sure you discuss any questions you have with your health care provider. Document Released: 06/16/2008 Document Revised: 02/13/2016 Document Reviewed: 01/02/2015  2017 Elsevier Information on my medicine - Coumadin   (Warfarin)  This medication education was reviewed with me or my healthcare representative as part of my discharge preparation.  The pharmacist that spoke with me during my hospital stay was:  Eudelia Bunch, [facility]  Why was Coumadin prescribed for you? Coumadin was prescribed for you because you have a blood clot or a medical condition that can cause an increased risk of forming blood clots. Blood clots can cause serious health problems by blocking the flow of blood to the heart, lung, or brain. Coumadin can prevent harmful blood clots from forming. As a reminder your indication for Coumadin is:   Stroke Prevention Because Of Atrial Fibrillation  What test will check  on my response to Coumadin? While on Coumadin (warfarin) you will need to have an INR test regularly to ensure that your dose is keeping you in the desired range. The INR (international normalized ratio) number is calculated from the result of the laboratory test called prothrombin time (PT).  If an INR APPOINTMENT HAS NOT ALREADY BEEN MADE FOR YOU please schedule an appointment to have this lab work done by your health  care provider within 7 days. Your INR goal is usually a number between:  2 to 3 or your provider may give you a more narrow range like 2-2.5.  Ask your health care provider during an office visit what your goal INR is.  What  do you need to  know  About  COUMADIN? Take Coumadin (warfarin) exactly as prescribed by your healthcare provider about the same time each day.  DO NOT stop taking without talking to the doctor who prescribed the medication.  Stopping without other blood clot prevention medication to take the place of Coumadin may increase your risk of developing a new clot or stroke.  Get refills before you run out.  What do you do if you miss a dose? If you miss a dose, take it as soon as you remember on the same day then continue your regularly scheduled regimen the next day.  Do not take two doses of Coumadin at the same time.  Important Safety Information A possible side effect of Coumadin (Warfarin) is an increased risk of bleeding. You should call your healthcare provider right away if you experience any of the following: ? Bleeding from an injury or your nose that does not stop. ? Unusual colored urine (red or dark brown) or unusual colored stools (red or black). ? Unusual bruising for unknown reasons. ? A serious fall or if you hit your head (even if there is no bleeding).  Some foods or medicines interact with Coumadin (warfarin) and might alter your response to warfarin. To help avoid this: ? Eat a balanced diet, maintaining a consistent amount of Vitamin K. ? Notify your provider about major diet changes you plan to make. ? Avoid alcohol or limit your intake to 1 drink for women and 2 drinks for men per day. (1 drink is 5 oz. wine, 12 oz. beer, or 1.5 oz. liquor.)  Make sure that ANY health care provider who prescribes medication for you knows that you are taking Coumadin (warfarin).  Also make sure the healthcare provider who is monitoring your Coumadin knows when you have  started a new medication including herbals and non-prescription products.  Coumadin (Warfarin)  Major Drug Interactions  Increased Warfarin Effect Decreased Warfarin Effect  Alcohol (large quantities) Antibiotics (esp. Septra/Bactrim, Flagyl, Cipro) Amiodarone (Cordarone) Aspirin (ASA) Cimetidine (Tagamet) Megestrol (Megace) NSAIDs (ibuprofen, naproxen, etc.) Piroxicam (Feldene) Propafenone (Rythmol SR) Propranolol (Inderal) Isoniazid (INH) Posaconazole (Noxafil) Barbiturates (Phenobarbital) Carbamazepine (Tegretol) Chlordiazepoxide (Librium) Cholestyramine (Questran) Griseofulvin Oral Contraceptives Rifampin Sucralfate (Carafate) Vitamin K   Coumadin (Warfarin) Major Herbal Interactions  Increased Warfarin Effect Decreased Warfarin Effect  Garlic Ginseng Ginkgo biloba Coenzyme Q10 Green tea St. Johns wort    Coumadin (Warfarin) FOOD Interactions  Eat a consistent number of servings per week of foods HIGH in Vitamin K (1 serving =  cup)  Collards (cooked, or boiled & drained) Kale (cooked, or boiled & drained) Mustard greens (cooked, or boiled & drained) Parsley *serving size only =  cup Spinach (cooked, or boiled & drained) Swiss chard (cooked, or boiled & drained) Turnip  greens (cooked, or boiled & drained)  Eat a consistent number of servings per week of foods MEDIUM-HIGH in Vitamin K (1 serving = 1 cup)  Asparagus (cooked, or boiled & drained) Broccoli (cooked, boiled & drained, or raw & chopped) Brussel sprouts (cooked, or boiled & drained) *serving size only =  cup Lettuce, raw (green leaf, endive, romaine) Spinach, raw Turnip greens, raw & chopped   These websites have more information on Coumadin (warfarin):  FailFactory.se; VeganReport.com.au;

## 2016-10-23 NOTE — Telephone Encounter (Signed)
I spoke with the pt's son Gwyndolyn Saxon and he wanted to make sure that Dr Burt Knack is aware of what is taking place with the pt in the hospital.  Dr Debara Pickett saw the pt today but Gwyndolyn Saxon initially did not know that Dr Debara Pickett is one of Dr Antionette Char partners at [facility].  I made Gwyndolyn Saxon aware that Dr Debara Pickett and Dr Burt Knack have discussed this pt's plan of care.

## 2016-10-25 NOTE — Discharge Summary (Signed)
[facility] Discharge Summary   Patient: Angela Trujillo [id number]   PCP: Doretha Sou, MD DOB: 05-02-1931   Date of admission: 10/22/2016   Date of discharge: 10/23/2016    Discharge Diagnoses:  Principal Problem:   Atrial fibrillation with RVR ([place]) Active Problems:   Normocytic anemia   HTN (hypertension)   Diabetes mellitus, type II, insulin dependent (HCC)   S/P TAVR (transcatheter aortic valve replacement)   ESRD (end stage renal disease) (HCC)   Chronic diastolic CHF (congestive heart failure) ([place])   Thrombocytopenia (HCC)   Elevated troponin   Admitted From: home Disposition:  Home with home health  Recommendations for Outpatient Follow-up:  1. Follow-up with PCP in one week. 2. Follow-up with cardiology for INR checkup   Follow-up Information    [facility] Follow up on 10/26/2016.   Specialty:  Cardiology Why:  at 2:30pm for your INR check Contact information: [address] [place]       Doretha Sou, MD. Schedule an appointment as soon as possible for a visit in 1 week(s).   Specialty:  Internal Medicine Why:  get a GI referral for anemia. Contact information: [address] [place] [address] [phone]          Diet recommendation: Cardiac diet  Activity: The patient is advised to gradually reintroduce usual activities.  Discharge Condition: good  Code Status: Full code  History of present illness: As per the H and P dictated on admission, "Angela Trujillo is a 83 y.o. female with medical history significant for insulin-dependent diabetes mellitus, hypertension, end-stage renal disease on hemodialysis, and severe aortic stenosis status post TAVR on 09/29/2016, now presenting to the ED for evaluation of generalized weakness, fatigue, and intermittently rapid heart rate. Patient has history of atrial flutter but has not been anticoagulated for  that. Since the patient was discharged from the hospital following her TAVR there has been recurrent transient episodes of generalized weakness and fatigue and the patient is noted to have rapid heart rate during these episodes. She was seen in the emergency department 2 days ago, sent from dialysis for a heart rate in the 170s, but was in a sinus rhythm with normal rate upon arrival. She has had recurrent episodes since that time and her son has checked her pulse rate at home, finding it to fluctuate from 70s to 160s. She had one of these episodes this afternoon and was brought into the ED for evaluation."  Hospital Course:   Summary of her active problems in the hospital is as following. 1. Atrial fibrillation with RVR  - Has had recurrent episodes of a fib with RVR in recent days, converting back to sinus   - Was given Lopressor 5 mg IVP without apparent response, her home-dose Lopressor 100 mg, then diltiazem IVP and infusion - Shortly into the dilt infusion, HR increased to 170's and sustained there; diltiazem was stopped and she converted to sinus within a few minutes  - CHADS-VASc at least 5 (age x2, gender, DM, HTN) - Cardiology is consulting and much appreciated; given left-atrial enlargement and valvular disease, she is not  expected to remain in sinus  - She was started on heparin infusion, warfarin, amiodarone 200 mg BID, beta-blocker continued  - Continue aspirin and stop Plavix per cardiology   2. ESRD on HD  - Dialyzes TTS  - Continue binders, renal diet with fluid-restrictions   3. Insulin-dependent DM - A1c  was 8.5% January 2018 - Managed at home with Levemir 14 units qHS   4. Normocytic anemia, thrombocytopenia  - Hgb 9.0 on admission, stable relative to priors in 9-10 range  - Platelets stable at 110,000;  - No endoscopy reports on file - Repeat CBC in 1 week with PCP.  5. Hypertension  - BP elevated on admission  - Continue clonidine, Toprol, hydralazine    6.  Elevated troponin - Likely secondary to atrial fib with rapid rate - No anginal complaints  - Cardiology was consulted, recommended outpatient follow-up.  All other chronic medical condition were stable during the hospitalization.  Patient was seen by physical therapy, who recommended home health, which was arranged by Education officer, museum and case Freight forwarder. On the day of the discharge the patient's vitals were stable, and no other acute medical condition were reported by patient. the patient was felt safe to be discharge at home with home health.  Procedures and Results:   none  Consultations:  Cardiology  DISCHARGE MEDICATION: Discharge Medication List as of 10/23/2016  2:53 PM    START taking these medications   Details  amiodarone (PACERONE) 200 MG tablet Take 1 tablet (200 mg total) by mouth 2 (two) times daily., Starting Fri 10/23/2016, Normal    warfarin (COUMADIN) 2.5 MG tablet Take 1 tablet (2.5 mg total) by mouth daily at 6 PM., Starting Fri 10/23/2016, Normal      CONTINUE these medications which have NOT CHANGED   Details  aspirin EC 81 MG EC tablet Take 1 tablet (81 mg total) by mouth daily., Starting Mon 10/05/2016, No Print    atorvastatin (LIPITOR) 20 MG tablet Take 20 mg by mouth at bedtime., Starting Thu 06/04/2016, Historical Med    calcium acetate (PHOSLO) 667 MG capsule Take 667 mg by mouth daily with breakfast., Historical Med    cloNIDine (CATAPRES) 0.1 MG tablet Take 1 tablet (0.1 mg total) by mouth 2 (two) times daily., Starting Sun 10/04/2016, Normal    famotidine (PEPCID) 20 MG tablet Take 20 mg by mouth 2 (two) times daily., Historical Med    hydrALAZINE (APRESOLINE) 50 MG tablet Take 1 tablet (50 mg total) by mouth 3 (three) times daily., Starting Sun 10/04/2016, Normal    HYDROcodone-acetaminophen (HYCET) 7.5-325 mg/15 ml solution Take 10-15 mLs by mouth every 6 (six) hours as needed for moderate pain or severe pain., Starting Fri 09/25/2016, Until Sat 09/25/2017,  Print    Insulin Detemir (LEVEMIR FLEXTOUCH) 100 UNIT/ML Pen Inject 14 Units into the skin daily at 10 pm., Starting Sun 10/04/2016, No Print    metoprolol succinate (TOPROL-XL) 100 MG 24 hr tablet Take 100 mg by mouth daily., Starting Mon 06/29/2016, Historical Med    Multiple Vitamins-Minerals (MULTIVITAMIN PO) Take 1 tablet by mouth daily., Historical Med    multivitamin (RENA-VIT) TABS tablet Take 1 tablet by mouth at bedtime., Starting Sun 10/04/2016, No Print    SENSIPAR 30 MG tablet Take 30 mg by mouth Every Tuesday,Thursday,and Saturday with dialysis. , Starting Thu 08/06/2016, Historical Med    chlorhexidine (PERIDEX) 0.12 % solution Rinse with 15 mls twice daily for 30 seconds. Use after breakfast and at bedtime. Spit out excess. Do not swallow., Normal      STOP taking these medications     clopidogrel (PLAVIX) 75 MG tablet        Allergies  Allergen Reactions  . Asa [Aspirin] Nausea Only and Other (See Comments)    MAKES STOMACH HURT  . Codeine Rash  .  Penicillins Rash     Has patient had a PCN reaction causing immediate rash, facial/tongue/throat swelling, SOB or lightheadedness with hypotension:  #  #  #  NO  #  #  #  Has patient had a PCN reaction causing severe rash involving mucus membranes or skin necrosis:  #  #  #  NO  #  #  # Has patient had a PCN reaction that required hospitalization:  #  #  #  NO  #  #  #  Has patient had a PCN reaction occurring within the last 10 years:  #  #  #  NO  #  #  #  If all answers are "NO", may proceed with Cephalosporin   Discharge Instructions    Ambulatory referral to Anticoagulation Monitoring    Complete by:  As directed    INR Goal:  2.0-3.0   Responsible Group:  [facility] [address]     Discharge Exam: Filed Weights   10/22/16 1604 10/22/16 2043  Weight: 66.7 kg (147 lb) 65.3 kg (144 lb)   Vitals:   10/23/16 0820 10/23/16 1433  BP: (!) 162/60 (!) 151/59  Pulse: 74 69  Resp:  18  Temp: 98.2 F (36.8 C) 98.4 F  (36.9 C)   General: Appear in no distress, no Rash; Oral Mucosa moist. Cardiovascular: S1 and S2 Present, aortic systolic Murmur, no JVD Respiratory: Bilateral Air entry present and Clear to Auscultation, no Crackles, no wheezes Abdomen: Bowel Sound present, Soft and no tenderness Extremities: no Pedal edema, no calf tenderness Neurology: Grossly no focal neuro deficit.  The results of significant diagnostics from this hospitalization (including imaging, microbiology, ancillary and laboratory) are listed below for reference.    Significant Diagnostic Studies: Dg Chest Port 1 View  Result Date: 10/22/2016 CLINICAL DATA:  Nausea and vomiting and uncontrolled atrial fibrillation today. Patient has dialysis dependent renal failure. History of hypertension and diabetes. EXAM: PORTABLE CHEST 1 VIEW COMPARISON:  Portable chest x-ray of October 20, 2016 FINDINGS: The lungs are well-expanded. The interstitial markings are increased. The pulmonary vascularity is engorged. The cardiac silhouette is enlarged. There is calcification in the wall of the aortic arch. IMPRESSION: CHF with mild pulmonary interstitial edema. No alveolar pneumonia nor pleural effusion. Thoracic aortic atherosclerosis. Electronically Signed   By: David  Martinique M.D.   On: 10/22/2016 16:37   Dg Chest Portable 1 View  Result Date: 10/20/2016 CLINICAL DATA:  Lightheadedness during dialysis. Aortic valve replacement performed 2 weeks ago. EXAM: PORTABLE CHEST 1 VIEW COMPARISON:  10/02/2016 and earlier priors FINDINGS: Stable cardiomegaly. Aortic valve prosthesis stable. Atherosclerotic calcification of the aortic arch. Pulmonary vascularity within normal limits. No focal airspace disease, pulmonary edema or pleural effusion. Negative for pneumothorax. Right axillary region vascular stent. IMPRESSION: Cardiac enlargement and aortic valve replacement. No acute abnormality identified. Electronically Signed   By: Curlene Dolphin M.D.   On:  10/20/2016 16:07   Dg Chest Port 1 View  Result Date: 10/02/2016 CLINICAL DATA:  Status post transcatheter aortic valve replacement. Altered mental status. Code stroke. EXAM: PORTABLE CHEST 1 VIEW COMPARISON:  09/30/2016 FINDINGS: Postoperative aortic valve prosthesis. Cardiac enlargement without vascular congestion. No focal airspace disease or consolidation in the lungs. No blunting of costophrenic angles. No pneumothorax. Right central venous catheter with tip over the cavoatrial junction region. Calcification of the aorta. Vascular stent in the right axilla. IMPRESSION: Cardiac enlargement.  No evidence of active pulmonary disease. Electronically Signed   By: Gwyndolyn Saxon  Gerilyn Nestle M.D.   On: 10/02/2016 03:40   Dg Chest Port 1 View  Result Date: 09/30/2016 CLINICAL DATA:  Chest soreness, post transcatheter aortic valve replacement EXAM: PORTABLE CHEST 1 VIEW COMPARISON:  Chest x-ray of 09/29/2016 FINDINGS: No active infiltrate or effusion is seen. No pneumothorax is noted. There is cardiomegaly present and there may be very minimal pulmonary vascular congestion. Prosthetic aortic valve replacement is noted. Right IJ central venous line tip overlies the lower SVC. IMPRESSION: 1. Stable cardiomegaly. Cannot exclude mild pulmonary vascular congestion. 2. Right IJ central venous line catheter tip overlies the lower SVC. 3. Aortic valve replacement is noted. . Electronically Signed   By: Ivar Drape M.D.   On: 09/30/2016 08:33   Dg Chest Port 1 View  Result Date: 09/29/2016 CLINICAL DATA:  Severe aortic stenosis status post transcatheter aortic valve replacement. EXAM: PORTABLE CHEST 1 VIEW COMPARISON:  PA and lateral chest x-ray of September 25, 2016 FINDINGS: There has been interval placement of a prosthetic aortic valve cage Assembly. The cardiac silhouette remains enlarged. The pulmonary vascularity is mildly prominent centrally. There is calcification in the wall of the aortic arch. There is no pleural  effusion or pneumothorax. The right internal jugular venous catheter tip projects over the lower third of the SVC. IMPRESSION: No postprocedure complication observed following transcatheter aortic valve replacement. Mild stable cardiomegaly with minimal pulmonary vascular congestion. Thoracic aortic atherosclerosis. Electronically Signed   By: David  Martinique M.D.   On: 09/29/2016 14:53   Ct Head Code Stroke Wo Contrast  Result Date: 10/02/2016 CLINICAL DATA:  Code stroke.  Confusion and altered mental status EXAM: CT HEAD WITHOUT CONTRAST TECHNIQUE: Contiguous axial images were obtained from the base of the skull through the vertex without intravenous contrast. COMPARISON:  None. FINDINGS: Brain: No mass lesion, intraparenchymal hemorrhage or extra-axial collection. No evidence of acute cortical infarct. There is periventricular hypoattenuation compatible with chronic microvascular disease. Vascular: Atherosclerotic calcification of the vertebral and internal carotid arteries at the skull base. Skull: Normal visualized skull base, calvarium and extracranial soft tissues. Sinuses/Orbits: Partial opacification of the sphenoid sinuses. Normal orbits. ASPECTS [facility] Stroke Program Early CT Score) - Ganglionic level infarction (caudate, lentiform nuclei, internal capsule, insula, M1-M3 cortex): 7 - Supraganglionic infarction (M4-M6 cortex): 3 Total score (0-10 with 10 being normal): 10 IMPRESSION: 1. Chronic microvascular ischemia and volume loss without acute intracranial abnormality. 2. ASPECTS is 10. These results were called by telephone at the time of interpretation on 10/02/2016 at 4:00 am to Dr. Kerney Elbe, who verbally acknowledged these results. Electronically Signed   By: Ulyses Jarred M.D.   On: 10/02/2016 04:00    Microbiology: No results found for this or any previous visit (from the past 240 hour(s)).   Labs: CBC:  Recent Labs Lab 10/20/16 1608 10/20/16 1659 10/22/16 1610 10/23/16 0409    WBC 7.4  --  13.7* 9.8  NEUTROABS  --   --  11.9*  --   HGB 9.7* 10.2* 9.0* 8.5*  HCT 29.2* 30.0* 27.1* 26.0*  MCV 91.5  --  92.2 92.9  PLT 88*  --  110* [id number]*   Basic Metabolic Panel:  Recent Labs Lab 10/20/16 1608 10/20/16 1659 10/22/16 1610 10/23/16 1128  NA 141 140 138 137  K 4.2 4.1 3.8 4.4  CL 103 101 101 101  CO2 26  --  23 24  GLUCOSE 202* 200* 116* 109*  BUN 31* 31* 17 29*  CREATININE 6.55* 6.80* 4.46* 6.21*  CALCIUM 8.8*  --  9.0 8.8*   Liver Function Tests:  Recent Labs Lab 10/20/16 1608 10/22/16 1610  AST 31 33  ALT 20 20  ALKPHOS 133* 139*  BILITOT 0.9 1.5*  PROT 7.2 7.4  ALBUMIN 3.5 3.6   No results for input(s): LIPASE, AMYLASE in the last 168 hours. No results for input(s): AMMONIA in the last 168 hours. Cardiac Enzymes:  Recent Labs Lab 10/22/16 2117 10/23/16 0409 10/23/16 1128  TROPONINI 0.20* 0.26* 0.20*   BNP (last 3 results) No results for input(s): BNP in the last 8760 hours. CBG:  Recent Labs Lab 10/22/16 2143 10/23/16 0714 10/23/16 1138  GLUCAP 150* 85 101*   Time spent: 30 minutes  Signed:  Berle Mull  [facility] 10/23/2016  3:39 PM

## 2016-10-26 ENCOUNTER — Other Ambulatory Visit: Payer: Self-pay

## 2016-10-26 ENCOUNTER — Other Ambulatory Visit: Payer: Self-pay | Admitting: Cardiovascular Disease

## 2016-10-26 ENCOUNTER — Ambulatory Visit (INDEPENDENT_AMBULATORY_CARE_PROVIDER_SITE_OTHER): Payer: Medicare Other | Admitting: *Deleted

## 2016-10-26 DIAGNOSIS — I350 Nonrheumatic aortic (valve) stenosis: Secondary | ICD-10-CM

## 2016-10-26 DIAGNOSIS — Z5181 Encounter for therapeutic drug level monitoring: Secondary | ICD-10-CM

## 2016-10-26 DIAGNOSIS — I4891 Unspecified atrial fibrillation: Secondary | ICD-10-CM | POA: Diagnosis not present

## 2016-10-26 DIAGNOSIS — Z952 Presence of prosthetic heart valve: Secondary | ICD-10-CM

## 2016-10-26 LAB — POCT INR: INR: 1.2

## 2016-10-26 MED ORDER — WARFARIN SODIUM 5 MG PO TABS: ORAL_TABLET | ORAL | 1 refills | Status: DC

## 2016-10-26 NOTE — Patient Instructions (Signed)

## 2016-10-27 ENCOUNTER — Telehealth: Payer: Self-pay | Admitting: *Deleted

## 2016-10-27 NOTE — Telephone Encounter (Signed)
[facility] called to inform us that the pt recieves HD at their center. They inquired about having the pt's INR checked at their center, however advised her that the pt is new to Coumadin & will need to be followed in our office because labs from HD takes days to come back & them their is a delay in dosing the pt's INR.  She asked if we could fax current dosing over to them after visits or send a copy with the pt.  Advised that we can do so & she stated that the Fax # is [phone] & Main # [phone]. Again, advised that we will not be sending any orders to have INR checked by them & she verbalized understanding.

## 2016-11-02 ENCOUNTER — Ambulatory Visit (INDEPENDENT_AMBULATORY_CARE_PROVIDER_SITE_OTHER): Payer: Medicare Other | Admitting: *Deleted

## 2016-11-02 DIAGNOSIS — Z5181 Encounter for therapeutic drug level monitoring: Secondary | ICD-10-CM | POA: Diagnosis not present

## 2016-11-02 DIAGNOSIS — I4891 Unspecified atrial fibrillation: Secondary | ICD-10-CM

## 2016-11-02 LAB — POCT INR: INR: 1.5

## 2016-11-11 ENCOUNTER — Ambulatory Visit (INDEPENDENT_AMBULATORY_CARE_PROVIDER_SITE_OTHER): Payer: Medicare Other | Admitting: Cardiovascular Disease

## 2016-11-11 ENCOUNTER — Ambulatory Visit (HOSPITAL_COMMUNITY): Payer: Medicare Other | Attending: Internal Medicine

## 2016-11-11 ENCOUNTER — Other Ambulatory Visit: Payer: Self-pay

## 2016-11-11 ENCOUNTER — Ambulatory Visit (INDEPENDENT_AMBULATORY_CARE_PROVIDER_SITE_OTHER): Payer: Medicare Other | Admitting: *Deleted

## 2016-11-11 VITALS — BP 140/54 | HR 58 | Ht 65.0 in | Wt 142.0 lb

## 2016-11-11 DIAGNOSIS — Z952 Presence of prosthetic heart valve: Secondary | ICD-10-CM | POA: Diagnosis not present

## 2016-11-11 DIAGNOSIS — I4891 Unspecified atrial fibrillation: Secondary | ICD-10-CM | POA: Diagnosis not present

## 2016-11-11 DIAGNOSIS — I350 Nonrheumatic aortic (valve) stenosis: Secondary | ICD-10-CM

## 2016-11-11 DIAGNOSIS — I110 Hypertensive heart disease with heart failure: Secondary | ICD-10-CM | POA: Diagnosis not present

## 2016-11-11 DIAGNOSIS — I509 Heart failure, unspecified: Secondary | ICD-10-CM | POA: Diagnosis not present

## 2016-11-11 DIAGNOSIS — E119 Type 2 diabetes mellitus without complications: Secondary | ICD-10-CM | POA: Diagnosis not present

## 2016-11-11 DIAGNOSIS — Z79899 Other long term (current) drug therapy: Secondary | ICD-10-CM

## 2016-11-11 DIAGNOSIS — I340 Nonrheumatic mitral (valve) insufficiency: Secondary | ICD-10-CM | POA: Diagnosis not present

## 2016-11-11 DIAGNOSIS — Z953 Presence of xenogenic heart valve: Secondary | ICD-10-CM | POA: Diagnosis not present

## 2016-11-11 DIAGNOSIS — I361 Nonrheumatic tricuspid (valve) insufficiency: Secondary | ICD-10-CM | POA: Diagnosis not present

## 2016-11-11 DIAGNOSIS — Z5181 Encounter for therapeutic drug level monitoring: Secondary | ICD-10-CM

## 2016-11-11 LAB — POCT INR: INR: 8

## 2016-11-11 NOTE — Addendum Note (Signed)
Addended by: Eulis Foster on: 11/11/2016 05:38 PM   Modules accepted: Orders

## 2016-11-11 NOTE — Patient Instructions (Signed)
Follow anticoagulation safety measures

## 2016-11-11 NOTE — Patient Instructions (Signed)
Medication Instructions:  Your physician recommends that you continue on your current medications as directed. Please refer to the Current Medication list given to you today.  On 11/19/16 please DECREASE Amiodarone to 200mg  once a day.   Labwork: Your physician recommends that you return for lab work in: 3 MONTHS (LIVER, TSH and Free T4)  Testing/Procedures: No new orders.   Follow-Up: Your physician recommends that you schedule a follow-up appointment in: 3 MONTHS with Dr Burt Knack   Any Other Special Instructions Will Be Listed Below (If Applicable).     If you need a refill on your cardiac medications before your next appointment, please call your pharmacy.

## 2016-11-11 NOTE — Addendum Note (Signed)
Addended by: Eulis Foster on: 11/11/2016 05:37 PM   Modules accepted: Orders

## 2016-11-11 NOTE — Progress Notes (Signed)
Cardiology Office Note Date:  11/11/2016   ID:  Mindie Rawdon, DOB 08/01/1931, MRN [id number]  PCP:  Doretha Sou, MD  Cardiologist:  Sherren Mocha, MD    Chief Complaint  Patient presents with  . Aortic Valve Stenosis    s/p TAVR   History of Present Illness: Angela Trujillo is a 83 y.o. female who presents for TAVR follow-up after undergoing percutaneous transfemoral TAVR 09/29/2016. She had an uncomplicated post-op course. However, since discharge from the hospital she has been in and out of the hospital with atrial fibrillation with RVR. She most recently was hospitalized approximately 3 weeks ago and discharged 10/23/2016. She has been started on amiodarone and is treated with warfarin for anticoagulation.  She is here today with her son. She reports no major change in symptoms. She continues to have fatigue. She complains of shortness of breath with certain physical activity. Denies orthopnea, PND, or leg swelling. She is tolerating hemodialysis on a Tuesday, Thursday, Saturday schedule. No lightheadedness or syncope. No bleeding problems reported.   Past Medical History:  Diagnosis Date  . Atrial fibrillation with RVR ([place]) 10/22/2016  . Atrial flutter ([place])   . Diabetes mellitus with end stage renal disease ([place])   . Dyspnea    with exertion  . ESRD (end stage renal disease) on dialysis ([place])    HD on T,T, Sa  . GERD (gastroesophageal reflux disease)   . Hypertension   . Pneumonia 10/2015  . Severe aortic stenosis 08/19/2016   s/p TAVR 09/2016 // b. Echo 09/30/16: EF 50-55, inf-septal AK, Gr 2 DD, normally functioning TAVR, mean AV gradient 15 mmHg, mod MAC, mild MS, mild MR, trivial effusion    Past Surgical History:  Procedure Laterality Date  . ABDOMINAL HYSTERECTOMY    . CARDIAC CATHETERIZATION N/A 08/19/2016   Procedure: Right/Left Heart Cath and Coronary Angiography;  Surgeon: Sherren Mocha, MD;  Location: [facility];  Service: Cardiovascular;   Laterality: N/A;  . COLONOSCOPY    . IR GENERIC HISTORICAL  08/31/2016   IR RADIOLOGY PERIPHERAL GUIDED IV START 08/31/2016 Corrie Mckusick, DO [facility]  . IR GENERIC HISTORICAL  08/31/2016   IR US GUIDE VASC ACCESS RIGHT 08/31/2016 Corrie Mckusick, DO [facility]  . MULTIPLE EXTRACTIONS WITH ALVEOLOPLASTY N/A 09/25/2016   Procedure: EXTRACTION of tooth number 31 WITH ALVEOLOPLASTY AND GROSS DEBRIDEMENT OF REMAINING TEETH;  Surgeon: Lenn Cal, DDS;  Location: [facility];  Service: Oral Surgery;  Laterality: N/A;  . TEE WITHOUT CARDIOVERSION N/A 09/29/2016   Procedure: TRANSESOPHAGEAL ECHOCARDIOGRAM (TEE);  Surgeon: Sherren Mocha, MD;  Location: [facility];  Service: Open Heart Surgery;  Laterality: N/A;  . TRANSCATHETER AORTIC VALVE REPLACEMENT, TRANSFEMORAL N/A 09/29/2016   Procedure: TRANSCATHETER AORTIC VALVE REPLACEMENT, TRANSFEMORAL;  Surgeon: Sherren Mocha, MD;  Location: [facility];  Service: Open Heart Surgery;  Laterality: N/A;  . TUBAL LIGATION      Current Outpatient Prescriptions  Medication Sig Dispense Refill  . amiodarone (PACERONE) 200 MG tablet Take 1 tablet (200 mg total) by mouth 2 (two) times daily. 60 tablet 0  . aspirin EC 81 MG EC tablet Take 1 tablet (81 mg total) by mouth daily.    Marland Kitchen atorvastatin (LIPITOR) 20 MG tablet Take 20 mg by mouth at bedtime.    . calcium acetate (PHOSLO) 667 MG capsule Take 667 mg by mouth daily with breakfast.    . chlorhexidine (PERIDEX) 0.12 % solution Rinse with 15 mls twice daily for 30 seconds. Use after breakfast  and at bedtime. Spit out excess. Do not swallow. 480 mL prn  . cloNIDine (CATAPRES) 0.1 MG tablet Take 1 tablet (0.1 mg total) by mouth 2 (two) times daily. 60 tablet 5  . famotidine (PEPCID) 20 MG tablet Take 20 mg by mouth 2 (two) times daily.    . hydrALAZINE (APRESOLINE) 50 MG tablet Take 1 tablet (50 mg total) by mouth 3 (three) times daily. 90 tablet 5  . HYDROcodone-acetaminophen (HYCET) 7.5-325 mg/15 ml solution Take 10-15 mLs  by mouth every 6 (six) hours as needed for moderate pain or severe pain. 120 mL 0  . Insulin Detemir (LEVEMIR FLEXTOUCH) 100 UNIT/ML Pen Inject 14 Units into the skin daily at 10 pm. 15 mL 11  . metoprolol succinate (TOPROL-XL) 100 MG 24 hr tablet Take 100 mg by mouth daily.    . Multiple Vitamins-Minerals (MULTIVITAMIN PO) Take 1 tablet by mouth daily.    . multivitamin (RENA-VIT) TABS tablet Take 1 tablet by mouth at bedtime.  0  . SENSIPAR 30 MG tablet Take 30 mg by mouth Every Tuesday,Thursday,and Saturday with dialysis.     Marland Kitchen warfarin (COUMADIN) 5 MG tablet Take as directed by coumadin clinic 30 tablet 1   No current facility-administered medications for this visit.     Allergies:   Asa [aspirin]; Codeine; and Penicillins   Social History:  The patient  reports that she has never smoked. She has never used smokeless tobacco. She reports that she does not drink alcohol or use drugs.   Family History:  The patient's  family history is not on file.  ROS:  Please see the history of present illness.   All other systems are reviewed and negative.   PHYSICAL EXAM: VS:  BP (!) 140/54   Pulse (!) 58   Ht _0  (1.651 m)   Wt 142 lb (64.4 kg)   BMI 23.63 kg/m  , BMI Body mass index is 23.63 kg/m. GEN: Well nourished, well developed, in no acute distress  HEENT: normal  Neck: no JVD, no masses. Cardiac: RRR with 2/6 SEM at the RUSB, no diastolic murmur              Respiratory:  clear to auscultation bilaterally, normal work of breathing GI: soft, nontender, nondistended, + BS MS: no deformity or atrophy  Ext: no pretibial edema, pedal pulses 2+= bilaterally Skin: warm and dry, no rash Neuro:  Strength and sensation are intact Psych: euthymic mood, full affect  EKG:  EKG is not ordered today.  Recent Labs: 09/30/2016: Magnesium 1.7 10/22/2016: ALT 20; TSH 2.219 10/23/2016: BUN 29; Creatinine, Ser 6.21; Hemoglobin 8.5; Platelets 114; Potassium 4.4; Sodium 137   Lipid Panel       Component Value Date/Time   CHOL 181 04/15/2013 1120   TRIG 91 04/15/2013 1120   HDL 62 04/15/2013 1120   CHOLHDL 2.9 04/15/2013 1120   VLDL 18 04/15/2013 1120   LDLCALC 101 (H) 04/15/2013 1120      Wt Readings from Last 3 Encounters:  11/11/16 142 lb (64.4 kg)  10/22/16 144 lb (65.3 kg)  10/20/16 147 lb (66.7 kg)     Cardiac Studies Reviewed: 2D Echo: Study Conclusions  - Left ventricle: The cavity size was normal. There was mild   concentric hypertrophy. Systolic function was normal. The   estimated ejection fraction was in the range of 55% to 60%. Wall   motion was normal; there were no regional wall motion   abnormalities. Features are consistent with  a pseudonormal left   ventricular filling pattern, with concomitant abnormal relaxation   and increased filling pressure (grade 2 diastolic dysfunction). - Aortic valve: A TAVR stent-valve bioprosthesis was present and   functioning normally. - Mitral valve: Calcified annulus. Mildly thickened leaflets .   There was mild regurgitation. - Left atrium: The atrium was moderately dilated. - Right ventricle: The cavity size was moderately dilated. Systolic   function was mildly reduced. - Right atrium: The atrium was moderately dilated. - Tricuspid valve: There was mild-moderate regurgitation directed   centrally. - Pulmonary arteries: Systolic pressure was moderately increased.   PA peak pressure: 50 mm Hg (S).  ASSESSMENT AND PLAN: Severe aortic stenosis status post TAVR: The patient appears to be stable with New York Heart Association functional class II symptoms of chronic diastolic heart failure. Her echocardiogram is reviewed and her mean and peak transaortic valve gradients are 12 and 23 mmHg, respectively. There is no paravalvular regurgitation identified. She will continue current medications without change.  Atrial fibrillation, paroxysmal: Appears to be maintaining sinus rhythm on amiodarone. Should continue 200  mg twice daily through March 1, then reduce her dose to 200 mg daily. She is anticoagulated with warfarin. Will arrange follow-up in about 2 months with thyroid and liver function tests for routine amiodarone monitoring.  Hypertension: Blood pressure controlled on current medications  End-stage renal disease: Continues with Tuesday, Thursday, Saturday dialysis.  Current medicines are reviewed with the patient today.  The patient does not have concerns regarding medicines.  Labs/ tests ordered today include:  No orders of the defined types were placed in this encounter.   Disposition:   FU 3 months  Signed, Sherren Mocha, MD  11/11/2016 10:54 AM    [facility] [address] [place], [place], [place]  [address] Phone: [phone]; Fax: [phone]

## 2016-11-12 ENCOUNTER — Other Ambulatory Visit: Payer: Self-pay | Admitting: *Deleted

## 2016-11-12 ENCOUNTER — Encounter: Payer: Self-pay | Admitting: Cardiovascular Disease

## 2016-11-12 LAB — TSH: TSH: 1.32 u[IU]/mL (ref 0.450–4.500)

## 2016-11-12 LAB — HEPATIC FUNCTION PANEL
ALBUMIN: 3.9 g/dL (ref 3.5–4.7)
ALT: 17 IU/L (ref 0–32)
AST: 20 IU/L (ref 0–40)
Alkaline Phosphatase: 124 IU/L — ABNORMAL HIGH (ref 39–117)
BILIRUBIN TOTAL: 0.4 mg/dL (ref 0.0–1.2)
Bilirubin, Direct: 0.15 mg/dL (ref 0.00–0.40)
TOTAL PROTEIN: 6.3 g/dL (ref 6.0–8.5)

## 2016-11-12 LAB — PROTIME-INR
INR: 9.4 — AB (ref 0.8–1.2)
PROTHROMBIN TIME: 85.2 s — AB (ref 9.1–12.0)

## 2016-11-12 LAB — T4, FREE: FREE T4: 1.76 ng/dL (ref 0.82–1.77)

## 2016-11-16 ENCOUNTER — Ambulatory Visit (INDEPENDENT_AMBULATORY_CARE_PROVIDER_SITE_OTHER): Payer: Medicare Other | Admitting: Pharmacist

## 2016-11-16 DIAGNOSIS — I4891 Unspecified atrial fibrillation: Secondary | ICD-10-CM | POA: Diagnosis not present

## 2016-11-16 DIAGNOSIS — Z5181 Encounter for therapeutic drug level monitoring: Secondary | ICD-10-CM

## 2016-11-16 LAB — POCT INR: INR: 1.4

## 2016-11-23 ENCOUNTER — Ambulatory Visit (INDEPENDENT_AMBULATORY_CARE_PROVIDER_SITE_OTHER): Payer: Medicare Other | Admitting: *Deleted

## 2016-11-23 DIAGNOSIS — Z5181 Encounter for therapeutic drug level monitoring: Secondary | ICD-10-CM | POA: Diagnosis not present

## 2016-11-23 DIAGNOSIS — I4891 Unspecified atrial fibrillation: Secondary | ICD-10-CM

## 2016-11-23 LAB — POCT INR: INR: 1.6

## 2016-11-25 ENCOUNTER — Telehealth: Payer: Self-pay | Admitting: Cardiovascular Disease

## 2016-11-25 NOTE — Telephone Encounter (Signed)
I called Gwyndolyn Saxon back and he said someone called him from our office number and he got disconnected.  I made him aware that I had not attempted to contact him. The pt does have a pending appointment with the coumadin clinic on 11/30/16 and he is aware of this appt.  If the coumadin clinic is trying to reach him then they can give him a call back but he is aware of 3/12 appt. I spoke with Joellen Jersey in the Coumadin clinic and she made a note for the nurses.

## 2016-11-25 NOTE — Telephone Encounter (Signed)
New message    Pt son is asking for Lauren to call him back.

## 2016-11-27 ENCOUNTER — Other Ambulatory Visit: Payer: Self-pay | Admitting: *Deleted

## 2016-11-27 MED ORDER — AMIODARONE HCL 200 MG PO TABS: 200.0000 mg | ORAL_TABLET | Freq: Every day | ORAL | 11 refills | Status: DC

## 2016-11-27 NOTE — Telephone Encounter (Signed)
AVS Reports   Date/Time Report Action User  11/11/2016 11:17 AM After Visit Summary Printed Barkley Boards, RN  Patient Instructions   Medication Instructions:  Your physician recommends that you continue on your current medications as directed. Please refer to the Current Medication list given to you today.  On 11/19/16 please DECREASE Amiodarone to 200mg  once a day.

## 2016-12-01 ENCOUNTER — Other Ambulatory Visit: Payer: Self-pay | Admitting: Cardiovascular Disease

## 2016-12-04 ENCOUNTER — Ambulatory Visit (INDEPENDENT_AMBULATORY_CARE_PROVIDER_SITE_OTHER): Payer: Medicare Other | Admitting: *Deleted

## 2016-12-04 DIAGNOSIS — I4891 Unspecified atrial fibrillation: Secondary | ICD-10-CM

## 2016-12-04 DIAGNOSIS — Z5181 Encounter for therapeutic drug level monitoring: Secondary | ICD-10-CM

## 2016-12-04 LAB — POCT INR: INR: 6.8

## 2016-12-04 LAB — PROTIME-INR
INR: 5.4 — ABNORMAL HIGH (ref 0.8–1.2)
Prothrombin Time: 53.5 s — ABNORMAL HIGH (ref 9.1–12.0)

## 2016-12-11 ENCOUNTER — Ambulatory Visit (INDEPENDENT_AMBULATORY_CARE_PROVIDER_SITE_OTHER): Payer: Medicare Other | Admitting: *Deleted

## 2016-12-11 DIAGNOSIS — Z5181 Encounter for therapeutic drug level monitoring: Secondary | ICD-10-CM

## 2016-12-11 DIAGNOSIS — I4891 Unspecified atrial fibrillation: Secondary | ICD-10-CM | POA: Diagnosis not present

## 2016-12-11 LAB — POCT INR: INR: 1.7

## 2016-12-18 ENCOUNTER — Ambulatory Visit (INDEPENDENT_AMBULATORY_CARE_PROVIDER_SITE_OTHER): Payer: Medicare Other | Admitting: *Deleted

## 2016-12-18 DIAGNOSIS — I4891 Unspecified atrial fibrillation: Secondary | ICD-10-CM

## 2016-12-18 DIAGNOSIS — Z5181 Encounter for therapeutic drug level monitoring: Secondary | ICD-10-CM | POA: Diagnosis not present

## 2016-12-18 LAB — POCT INR: INR: 1.9

## 2016-12-25 ENCOUNTER — Ambulatory Visit (INDEPENDENT_AMBULATORY_CARE_PROVIDER_SITE_OTHER): Payer: Medicare Other

## 2016-12-25 DIAGNOSIS — I4891 Unspecified atrial fibrillation: Secondary | ICD-10-CM

## 2016-12-25 DIAGNOSIS — Z5181 Encounter for therapeutic drug level monitoring: Secondary | ICD-10-CM | POA: Diagnosis not present

## 2016-12-25 LAB — POCT INR: INR: 4.6

## 2017-01-01 ENCOUNTER — Ambulatory Visit (INDEPENDENT_AMBULATORY_CARE_PROVIDER_SITE_OTHER): Payer: Medicare Other | Admitting: Pharmacist

## 2017-01-01 DIAGNOSIS — Z5181 Encounter for therapeutic drug level monitoring: Secondary | ICD-10-CM

## 2017-01-01 DIAGNOSIS — I4891 Unspecified atrial fibrillation: Secondary | ICD-10-CM | POA: Diagnosis not present

## 2017-01-01 LAB — POCT INR: INR: 4.4

## 2017-01-08 ENCOUNTER — Ambulatory Visit (INDEPENDENT_AMBULATORY_CARE_PROVIDER_SITE_OTHER): Payer: Medicare Other | Admitting: *Deleted

## 2017-01-08 DIAGNOSIS — I4891 Unspecified atrial fibrillation: Secondary | ICD-10-CM

## 2017-01-08 DIAGNOSIS — Z5181 Encounter for therapeutic drug level monitoring: Secondary | ICD-10-CM

## 2017-01-08 LAB — POCT INR: INR: 1.8

## 2017-01-13 ENCOUNTER — Telehealth: Payer: Self-pay | Admitting: Cardiovascular Disease

## 2017-01-13 MED ORDER — WARFARIN SODIUM 5 MG PO TABS: ORAL_TABLET | ORAL | 2 refills | Status: DC

## 2017-01-13 NOTE — Telephone Encounter (Signed)
New message   *STAT* If patient is at the pharmacy, call can be transferred to refill team.   1. Which medications need to be refilled? (please list name of each medication and dose if known) warfarin (COUMADIN) 5 MG tablet 2. Which pharmacy/location (including street and city if local pharmacy) is medication to be sent to? [address] [place]  3. Do they need a 30 day or 90 day supply? 30 day

## 2017-01-13 NOTE — Telephone Encounter (Signed)
Warfarin refill sent to pharmacy as requested. 

## 2017-01-22 ENCOUNTER — Ambulatory Visit (INDEPENDENT_AMBULATORY_CARE_PROVIDER_SITE_OTHER): Payer: Medicare Other | Admitting: *Deleted

## 2017-01-22 DIAGNOSIS — Z5181 Encounter for therapeutic drug level monitoring: Secondary | ICD-10-CM | POA: Diagnosis not present

## 2017-01-22 DIAGNOSIS — I4891 Unspecified atrial fibrillation: Secondary | ICD-10-CM

## 2017-01-22 LAB — PROTIME-INR
INR: 4.9 — ABNORMAL HIGH (ref 0.8–1.2)
PROTHROMBIN TIME: 49.2 s — AB (ref 9.1–12.0)

## 2017-01-22 LAB — POCT INR: INR: 6.7

## 2017-02-01 ENCOUNTER — Ambulatory Visit (INDEPENDENT_AMBULATORY_CARE_PROVIDER_SITE_OTHER): Payer: Medicare Other | Admitting: *Deleted

## 2017-02-01 DIAGNOSIS — I4891 Unspecified atrial fibrillation: Secondary | ICD-10-CM | POA: Diagnosis not present

## 2017-02-01 DIAGNOSIS — Z5181 Encounter for therapeutic drug level monitoring: Secondary | ICD-10-CM | POA: Diagnosis not present

## 2017-02-01 LAB — POCT INR: INR: 1.2

## 2017-02-08 ENCOUNTER — Encounter: Payer: Self-pay | Admitting: Cardiovascular Disease

## 2017-02-08 ENCOUNTER — Ambulatory Visit (INDEPENDENT_AMBULATORY_CARE_PROVIDER_SITE_OTHER): Payer: Medicare Other | Admitting: Cardiovascular Disease

## 2017-02-08 ENCOUNTER — Other Ambulatory Visit: Payer: Medicare Other

## 2017-02-08 ENCOUNTER — Ambulatory Visit (INDEPENDENT_AMBULATORY_CARE_PROVIDER_SITE_OTHER): Payer: Medicare Other

## 2017-02-08 VITALS — BP 130/50 | HR 63 | Ht 65.0 in | Wt 147.8 lb

## 2017-02-08 DIAGNOSIS — I10 Essential (primary) hypertension: Secondary | ICD-10-CM | POA: Diagnosis not present

## 2017-02-08 DIAGNOSIS — Z952 Presence of prosthetic heart valve: Secondary | ICD-10-CM | POA: Diagnosis not present

## 2017-02-08 DIAGNOSIS — I4891 Unspecified atrial fibrillation: Secondary | ICD-10-CM | POA: Diagnosis not present

## 2017-02-08 DIAGNOSIS — Z5181 Encounter for therapeutic drug level monitoring: Secondary | ICD-10-CM

## 2017-02-08 DIAGNOSIS — I350 Nonrheumatic aortic (valve) stenosis: Secondary | ICD-10-CM

## 2017-02-08 LAB — POCT INR: INR: 1.4

## 2017-02-08 MED ORDER — FAMOTIDINE 20 MG PO TABS: 20.0000 mg | ORAL_TABLET | Freq: Two times a day (BID) | ORAL | 2 refills | Status: DC

## 2017-02-08 NOTE — Progress Notes (Signed)
Cardiology Office Note Date:  02/08/2017   ID:  Angela Trujillo, DOB 22-Jun-1931, MRN [id number]  PCP:  Doretha Sou, MD  Cardiologist:  Sherren Mocha, MD    Chief Complaint  Patient presents with  . Follow-up     History of Present Illness: Angela Trujillo is a 83 y.o. female who presents for follow-up of aortic valve disease.   The patient underwent percutaneous transfemoral TAVR 92/42/6834 with an uncomplicated postoperative course. However, following hospital discharge she has had some episodes of atrial fibrillation with RVR. She was ultimately started on amiodarone and anticoagulated with warfarin. She is here with her children today.  Overall the patient is doing very well. She has mild dyspnea with physical exertion. She denies chest pain, lightheadedness, or syncope. She continues to live independently and she reports that she is tolerating intermittent hemodialysis without problems. She's had some problems regulating her warfarin. INR today is 1.4.  Past Medical History:  Diagnosis Date  . Atrial fibrillation with RVR ([place]) 10/22/2016  . Atrial flutter ([place])   . Diabetes mellitus with end stage renal disease (Angela Truj[place])   . Dyspnea    with exertion  . ESRD (end stage renal disease) on dialysis ([place])    HD on T,T, Sa  . GERD (gastroesophageal reflux disease)   . Hypertension   . Pneumonia 10/2015  . Severe aortic stenosis 08/19/2016   s/p TAVR 09/2016 // b. Echo 09/30/16: EF 50-55, inf-septal AK, Gr 2 DD, normally functioning TAVR, mean AV gradient 15 mmHg, mod MAC, mild MS, mild MR, trivial effusion    Past Surgical History:  Procedure Laterality Date  . ABDOMINAL HYSTERECTOMY    . CARDIAC CATHETERIZATION N/A 08/19/2016   Procedure: Right/Left Heart Cath and Coronary Angiography;  Surgeon: Sherren Mocha, MD;  Location: [facility];  Service: Cardiovascular;  Laterality: N/A;  . COLONOSCOPY    . IR GENERIC HISTORICAL  08/31/2016   IR RADIOLOGY  PERIPHERAL GUIDED IV START 08/31/2016 Angela Mckusick, DO [facility]  . IR GENERIC HISTORICAL  08/31/2016   IR US GUIDE VASC ACCESS RIGHT 08/31/2016 Angela Mckusick, DO [facility]  . MULTIPLE EXTRACTIONS WITH ALVEOLOPLASTY N/A 09/25/2016   Procedure: EXTRACTION of tooth number 31 WITH ALVEOLOPLASTY AND GROSS DEBRIDEMENT OF REMAINING TEETH;  Surgeon: Angela Trujillo, DDS;  Location: [facility];  Service: Oral Surgery;  Laterality: N/A;  . TEE WITHOUT CARDIOVERSION N/A 09/29/2016   Procedure: TRANSESOPHAGEAL ECHOCARDIOGRAM (TEE);  Surgeon: Sherren Mocha, MD;  Location: [facility];  Service: Open Heart Surgery;  Laterality: N/A;  . TRANSCATHETER AORTIC VALVE REPLACEMENT, TRANSFEMORAL N/A 09/29/2016   Procedure: TRANSCATHETER AORTIC VALVE REPLACEMENT, TRANSFEMORAL;  Surgeon: Sherren Mocha, MD;  Location: [facility];  Service: Open Heart Surgery;  Laterality: N/A;  . TUBAL LIGATION      Current Outpatient Prescriptions  Medication Sig Dispense Refill  . amiodarone (PACERONE) 200 MG tablet Take 1 tablet (200 mg total) by mouth daily. 30 tablet 11  . aspirin EC 81 MG EC tablet Take 1 tablet (81 mg total) by mouth daily.    Marland Kitchen atorvastatin (LIPITOR) 20 MG tablet Take 20 mg by mouth at bedtime.    . calcium acetate (PHOSLO) 667 MG capsule Take 667 mg by mouth daily with breakfast.    . chlorhexidine (PERIDEX) 0.12 % solution Rinse with 15 mls twice daily for 30 seconds. Use after breakfast and at bedtime. Spit out excess. Do not swallow. 480 mL prn  . cloNIDine (CATAPRES) 0.1 MG tablet Take 1 tablet (0.1  mg total) by mouth 2 (two) times daily. 60 tablet 5  . famotidine (PEPCID) 20 MG tablet Take 1 tablet (20 mg total) by mouth 2 (two) times daily. 180 tablet 2  . ferrous sulfate 325 (65 FE) MG tablet Take 325 mg by mouth 3 (three) times daily with meals.     . hydrALAZINE (APRESOLINE) 50 MG tablet Take 1 tablet (50 mg total) by mouth 3 (three) times daily. 90 tablet 5  . HYDROcodone-acetaminophen (HYCET) 7.5-325 mg/15  ml solution Take 10-15 mLs by mouth every 6 (six) hours as needed for moderate pain or severe pain. 120 mL 0  . Insulin Glargine (TOUJEO SOLOSTAR ) Inject 10 Units into the skin at bedtime.    . metoprolol succinate (TOPROL-XL) 100 MG 24 hr tablet Take 100 mg by mouth daily.    . Multiple Vitamins-Minerals (MULTIVITAMIN PO) Take 1 tablet by mouth daily.    . multivitamin (RENA-VIT) TABS tablet Take 1 tablet by mouth at bedtime.  0  . SENSIPAR 30 MG tablet Take 30 mg by mouth Every Tuesday,Thursday,and Saturday with dialysis.     Marland Kitchen warfarin (COUMADIN) 5 MG tablet TAKE AS DIRECTED BY THE COUMADIN CLINIC( 1 MONTH SUPPLY) 40 tablet 2   No current facility-administered medications for this visit.     Allergies:   Asa [aspirin]; Codeine; and Penicillins   Social History:  The patient  reports that she has never smoked. She has never used smokeless tobacco. She reports that she does not drink alcohol or use drugs.   Family History:  The patient's family history is not on file.    ROS:  Please see the history of present illness.   All other systems are reviewed and negative.    PHYSICAL EXAM: VS:  BP (!) 130/50   Pulse 63   Ht _0  (1.651 m)   Wt 147 lb 12.8 oz (67 kg)   BMI 24.60 kg/m  , BMI Body mass index is 24.6 kg/m. GEN: Pleasant elderly woman, in no acute distress  HEENT: normal  Neck: no JVD, no masses.  Cardiac: RRR without murmur or gallop                Respiratory:  clear to auscultation bilaterally, normal work of breathing GI: soft, nontender, nondistended, + BS MS: no deformity or atrophy  Ext: 1+ right ankle edema Skin: warm and dry, no rash Neuro:  Strength and sensation are intact Psych: euthymic mood, full affect  EKG:  EKG is ordered today. The ekg ordered today shows normal sinus rhythm 63 bpm, right bundle branch block, left anterior fascicular block, first-degree AV block with PR interval 258 ms.  Recent Labs: 09/30/2016: Magnesium 1.7 10/23/2016: BUN 29;  Creatinine, Ser 6.21; Hemoglobin 8.5; Platelets 114; Potassium 4.4; Sodium 137 11/11/2016: ALT 17; TSH 1.320   Lipid Panel     Component Value Date/Time   CHOL 181 04/15/2013 1120   TRIG 91 04/15/2013 1120   HDL 62 04/15/2013 1120   CHOLHDL 2.9 04/15/2013 1120   VLDL 18 04/15/2013 1120   LDLCALC 101 (H) 04/15/2013 1120      Wt Readings from Last 3 Encounters:  02/08/17 147 lb 12.8 oz (67 kg)  11/11/16 142 lb (64.4 kg)  10/22/16 144 lb (65.3 kg)     Cardiac Studies Reviewed: 2D Echo (post-op study): Study Conclusions  - Left ventricle: The cavity size was normal. There was mild   concentric hypertrophy. Systolic function was normal. The   estimated ejection  fraction was in the range of 55% to 60%. Wall   motion was normal; there were no regional wall motion   abnormalities. Features are consistent with a pseudonormal left   ventricular filling pattern, with concomitant abnormal relaxation   and increased filling pressure (grade 2 diastolic dysfunction). - Aortic valve: A TAVR stent-valve bioprosthesis was present and   functioning normally. - Mitral valve: Calcified annulus. Mildly thickened leaflets .   There was mild regurgitation. - Left atrium: The atrium was moderately dilated. - Right ventricle: The cavity size was moderately dilated. Systolic   function was mildly reduced. - Right atrium: The atrium was moderately dilated. - Tricuspid valve: There was mild-moderate regurgitation directed   centrally. - Pulmonary arteries: Systolic pressure was moderately increased.   PA peak pressure: 50 mm Hg (S).  ASSESSMENT AND PLAN: 1.  Aortic valve disease status post TAVR: Patient with NYHA functional class II symptoms. Overall appears stable in her valve function is normal on postoperative echo.  2. Paroxysmal atrial fibrillation: The patient is maintaining sinus rhythm on amiodarone. I reviewed her most recent thyroid and liver studies. She will be due for follow-up labs  in 6 month intervals. Will arrange a follow-up office visit after her next lab work is done. We discussed intake of green leafy vegetables and the impact on INR values.  3. Hypertension with end-stage renal disease: Blood pressure is well controlled on current medical program  4. Bifascicular block patient with significant conduction system disease but fortunately no symptoms of lightheadedness or syncope. Will need to follow this closely. She is at high risk of ultimately needing a pacemaker with use of amiodarone, PAF, and conduction disease as outlined.  Current medicines are reviewed with the patient today.  The patient does not have concerns regarding medicines.  Labs/ tests ordered today include:   Orders Placed This Encounter  Procedures  . TSH  . T4, free  . Comp Met (CMET)  . EKG 12-Lead    Disposition:   FU 6 months  Signed, Sherren Mocha, MD  02/08/2017 1:34 PM    [facility] [place], [place], [place]  [address] Phone: [phone]; Fax: [phone]

## 2017-02-08 NOTE — Patient Instructions (Signed)
Medication Instructions:  Your physician recommends that you continue on your current medications as directed. Please refer to the Current Medication list given to you today.  Labwork: Your physician recommends that you return for lab work in: August (TSH, Free T4, CMP)  Testing/Procedures: No new orders.   Follow-Up: Your physician recommends that you schedule a follow-up appointment in: September 2018 with Dr Burt Knack   Any Other Special Instructions Will Be Listed Below (If Applicable).     If you need a refill on your cardiac medications before your next appointment, please call your pharmacy.

## 2017-02-09 ENCOUNTER — Telehealth: Payer: Self-pay

## 2017-02-09 NOTE — Telephone Encounter (Signed)
**Note De-Identified Mattilynn Forrer Obfuscation** We received a prior authorization request on the pts Famotidine (Pepcid) from [employer]  Per message from the pharmacist the pts insurance will cover ranitidine but not Famotidine or the pt can purchase Pepcid OTC.   Please advise.

## 2017-02-10 NOTE — Telephone Encounter (Signed)
I advised the pts son Gwyndolyn Saxon that a prior authorization is needed on the pts Famotidine before the pharmacy will fill it.  He is aware that the pts insurance will cover Ranitidine but not Famotidine per fax from pharmacy.  I offered to change RX to Ranitidine 150 mg BID (Ok per Dr Burt Knack) but Gwyndolyn Saxon prefers to call the pts PCP to request a refill. He states that he will call me back if they decide to change to Ranitidine.

## 2017-02-10 NOTE — Telephone Encounter (Signed)
This is fine thanks 

## 2017-02-16 ENCOUNTER — Telehealth: Payer: Self-pay | Admitting: Cardiovascular Disease

## 2017-02-16 ENCOUNTER — Inpatient Hospital Stay (HOSPITAL_COMMUNITY)
Admission: EM | Admit: 2017-02-16 | Discharge: 2017-02-22 | DRG: 871 | Disposition: A | Discharge status: Home Health | Payer: Medicare Other | Attending: Family Medicine | Admitting: Family Medicine

## 2017-02-16 ENCOUNTER — Encounter (HOSPITAL_COMMUNITY): Payer: Self-pay

## 2017-02-16 ENCOUNTER — Emergency Department (HOSPITAL_COMMUNITY): Payer: Medicare Other

## 2017-02-16 DIAGNOSIS — D631 Anemia in chronic kidney disease: Secondary | ICD-10-CM | POA: Diagnosis not present

## 2017-02-16 DIAGNOSIS — R740 Nonspecific elevation of levels of transaminase and lactic acid dehydrogenase [LDH]: Secondary | ICD-10-CM

## 2017-02-16 DIAGNOSIS — J9601 Acute respiratory failure with hypoxia: Secondary | ICD-10-CM | POA: Diagnosis present

## 2017-02-16 DIAGNOSIS — Z7901 Long term (current) use of anticoagulants: Secondary | ICD-10-CM

## 2017-02-16 DIAGNOSIS — I959 Hypotension, unspecified: Secondary | ICD-10-CM | POA: Diagnosis present

## 2017-02-16 DIAGNOSIS — Z992 Dependence on renal dialysis: Secondary | ICD-10-CM

## 2017-02-16 DIAGNOSIS — Z7982 Long term (current) use of aspirin: Secondary | ICD-10-CM

## 2017-02-16 DIAGNOSIS — K219 Gastro-esophageal reflux disease without esophagitis: Secondary | ICD-10-CM | POA: Diagnosis not present

## 2017-02-16 DIAGNOSIS — E8770 Fluid overload, unspecified: Secondary | ICD-10-CM

## 2017-02-16 DIAGNOSIS — Z794 Long term (current) use of insulin: Secondary | ICD-10-CM | POA: Diagnosis not present

## 2017-02-16 DIAGNOSIS — I10 Essential (primary) hypertension: Secondary | ICD-10-CM

## 2017-02-16 DIAGNOSIS — Z79899 Other long term (current) drug therapy: Secondary | ICD-10-CM

## 2017-02-16 DIAGNOSIS — Z23 Encounter for immunization: Secondary | ICD-10-CM

## 2017-02-16 DIAGNOSIS — I132 Hypertensive heart and chronic kidney disease with heart failure and with stage 5 chronic kidney disease, or end stage renal disease: Secondary | ICD-10-CM | POA: Diagnosis not present

## 2017-02-16 DIAGNOSIS — E8729 Other acidosis: Secondary | ICD-10-CM

## 2017-02-16 DIAGNOSIS — A419 Sepsis, unspecified organism: Principal | ICD-10-CM

## 2017-02-16 DIAGNOSIS — J189 Pneumonia, unspecified organism: Secondary | ICD-10-CM | POA: Diagnosis present

## 2017-02-16 DIAGNOSIS — E875 Hyperkalemia: Secondary | ICD-10-CM

## 2017-02-16 DIAGNOSIS — Z952 Presence of prosthetic heart valve: Secondary | ICD-10-CM | POA: Diagnosis not present

## 2017-02-16 DIAGNOSIS — E1122 Type 2 diabetes mellitus with diabetic chronic kidney disease: Secondary | ICD-10-CM | POA: Diagnosis not present

## 2017-02-16 DIAGNOSIS — R7989 Other specified abnormal findings of blood chemistry: Secondary | ICD-10-CM

## 2017-02-16 DIAGNOSIS — I480 Paroxysmal atrial fibrillation: Secondary | ICD-10-CM

## 2017-02-16 DIAGNOSIS — M549 Dorsalgia, unspecified: Secondary | ICD-10-CM

## 2017-02-16 DIAGNOSIS — E785 Hyperlipidemia, unspecified: Secondary | ICD-10-CM | POA: Diagnosis not present

## 2017-02-16 DIAGNOSIS — D696 Thrombocytopenia, unspecified: Secondary | ICD-10-CM | POA: Diagnosis present

## 2017-02-16 DIAGNOSIS — R7401 Elevation of levels of liver transaminase levels: Secondary | ICD-10-CM

## 2017-02-16 DIAGNOSIS — I5032 Chronic diastolic (congestive) heart failure: Secondary | ICD-10-CM | POA: Diagnosis present

## 2017-02-16 DIAGNOSIS — Y95 Nosocomial condition: Secondary | ICD-10-CM | POA: Diagnosis present

## 2017-02-16 DIAGNOSIS — R0603 Acute respiratory distress: Secondary | ICD-10-CM

## 2017-02-16 DIAGNOSIS — N186 End stage renal disease: Secondary | ICD-10-CM | POA: Diagnosis present

## 2017-02-16 DIAGNOSIS — E872 Acidosis: Secondary | ICD-10-CM

## 2017-02-16 DIAGNOSIS — R0602 Shortness of breath: Secondary | ICD-10-CM | POA: Diagnosis present

## 2017-02-16 LAB — CBC WITH DIFFERENTIAL/PLATELET
Basophils Absolute: 0.1 10*3/uL (ref 0.0–0.1)
Basophils Relative: 1 %
EOS ABS: 0 10*3/uL (ref 0.0–0.7)
Eosinophils Relative: 0 %
HCT: 31.1 % — ABNORMAL LOW (ref 36.0–46.0)
HEMOGLOBIN: 9.6 g/dL — AB (ref 12.0–15.0)
Lymphocytes Relative: 12 %
Lymphs Abs: 1.4 10*3/uL (ref 0.7–4.0)
MCH: 29.9 pg (ref 26.0–34.0)
MCHC: 30.9 g/dL (ref 30.0–36.0)
MCV: 96.9 fL (ref 78.0–100.0)
MONO ABS: 0.7 10*3/uL (ref 0.1–1.0)
MONOS PCT: 6 %
NEUTROS ABS: 9.5 10*3/uL — AB (ref 1.7–7.7)
Neutrophils Relative %: 81 %
PLATELETS: 148 10*3/uL — AB (ref 150–400)
RBC: 3.21 MIL/uL — AB (ref 3.87–5.11)
RDW: 18.2 % — ABNORMAL HIGH (ref 11.5–15.5)
WBC: 11.7 10*3/uL — AB (ref 4.0–10.5)
nRBC: 3 /100 WBC — ABNORMAL HIGH

## 2017-02-16 LAB — I-STAT CG4 LACTIC ACID, ED: Lactic Acid, Venous: 10.04 mmol/L (ref 0.5–1.9)

## 2017-02-16 LAB — I-STAT ARTERIAL BLOOD GAS, ED
ACID-BASE DEFICIT: 6 mmol/L — AB (ref 0.0–2.0)
Bicarbonate: 17.6 mmol/L — ABNORMAL LOW (ref 20.0–28.0)
O2 Saturation: 98 %
TCO2: 18 mmol/L (ref 0–100)
pCO2 arterial: 28.7 mmHg — ABNORMAL LOW (ref 32.0–48.0)
pH, Arterial: 7.401 (ref 7.350–7.450)
pO2, Arterial: 115 mmHg — ABNORMAL HIGH (ref 83.0–108.0)

## 2017-02-16 LAB — I-STAT TROPONIN, ED: TROPONIN I, POC: 0.08 ng/mL (ref 0.00–0.08)

## 2017-02-16 LAB — COMPREHENSIVE METABOLIC PANEL
ALT: 81 U/L — ABNORMAL HIGH (ref 14–54)
ANION GAP: 28 — AB (ref 5–15)
AST: 288 U/L — ABNORMAL HIGH (ref 15–41)
Albumin: 3.2 g/dL — ABNORMAL LOW (ref 3.5–5.0)
Alkaline Phosphatase: 184 U/L — ABNORMAL HIGH (ref 38–126)
BILIRUBIN TOTAL: 1.5 mg/dL — AB (ref 0.3–1.2)
BUN: 46 mg/dL — ABNORMAL HIGH (ref 6–20)
CO2: 12 mmol/L — ABNORMAL LOW (ref 22–32)
Calcium: 7.8 mg/dL — ABNORMAL LOW (ref 8.9–10.3)
Chloride: 94 mmol/L — ABNORMAL LOW (ref 101–111)
Creatinine, Ser: 10.64 mg/dL — ABNORMAL HIGH (ref 0.44–1.00)
GFR calc Af Amer: 3 mL/min — ABNORMAL LOW (ref >60)
GFR calc non Af Amer: 3 mL/min — ABNORMAL LOW (ref >60)
Glucose, Bld: 221 mg/dL — ABNORMAL HIGH (ref 65–99)
POTASSIUM: 6.1 mmol/L — AB (ref 3.5–5.1)
Sodium: 134 mmol/L — ABNORMAL LOW (ref 135–145)
TOTAL PROTEIN: 7 g/dL (ref 6.5–8.1)

## 2017-02-16 LAB — HEPATITIS B SURFACE ANTIGEN: HEP B S AG: NEGATIVE

## 2017-02-16 LAB — LACTIC ACID, PLASMA: LACTIC ACID, VENOUS: 2.5 mmol/L — AB (ref 0.5–1.9)

## 2017-02-16 LAB — PROCALCITONIN: Procalcitonin: 8.91 ng/mL

## 2017-02-16 MED ORDER — ALBUTEROL SULFATE (2.5 MG/3ML) 0.083% IN NEBU
5.0000 mg | INHALATION_SOLUTION | Freq: Once | RESPIRATORY_TRACT | Status: AC
  Administered 2017-02-16: 5 mg via RESPIRATORY_TRACT

## 2017-02-16 MED ORDER — CALCIUM ACETATE (PHOS BINDER) 667 MG PO CAPS
667.0000 mg | ORAL_CAPSULE | Freq: Every day | ORAL | Status: DC
  Administered 2017-02-17 – 2017-02-22 (×6): 667 mg via ORAL
  Filled 2017-02-16 (×6): qty 1

## 2017-02-16 MED ORDER — DEXTROSE 5 % IV SOLN
500.0000 mg | Freq: Two times a day (BID) | INTRAVENOUS | Status: DC
  Administered 2017-02-17 – 2017-02-19 (×5): 500 mg via INTRAVENOUS
  Filled 2017-02-16 (×8): qty 0.5

## 2017-02-16 MED ORDER — PNEUMOCOCCAL VAC POLYVALENT 25 MCG/0.5ML IJ INJ
0.5000 mL | INJECTION | INTRAMUSCULAR | Status: AC
  Administered 2017-02-19: 0.5 mL via INTRAMUSCULAR
  Filled 2017-02-16: qty 0.5

## 2017-02-16 MED ORDER — ALBUTEROL SULFATE (2.5 MG/3ML) 0.083% IN NEBU: 2.5000 mg | INHALATION_SOLUTION | RESPIRATORY_TRACT | Status: DC | PRN

## 2017-02-16 MED ORDER — CINACALCET HCL 30 MG PO TABS
30.0000 mg | ORAL_TABLET | ORAL | Status: DC
  Administered 2017-02-18 – 2017-02-20 (×2): 30 mg via ORAL
  Filled 2017-02-16 (×4): qty 1

## 2017-02-16 MED ORDER — ACETAMINOPHEN 325 MG PO TABS
650.0000 mg | ORAL_TABLET | Freq: Four times a day (QID) | ORAL | Status: DC | PRN
  Administered 2017-02-18: 650 mg via ORAL
  Filled 2017-02-16: qty 2

## 2017-02-16 MED ORDER — HEPARIN SODIUM (PORCINE) 1000 UNIT/ML DIALYSIS: 1000.0000 [IU] | INTRAMUSCULAR | Status: DC | PRN

## 2017-02-16 MED ORDER — IPRATROPIUM-ALBUTEROL 0.5-2.5 (3) MG/3ML IN SOLN: 3.0000 mL | Freq: Four times a day (QID) | RESPIRATORY_TRACT | Status: DC

## 2017-02-16 MED ORDER — VANCOMYCIN HCL IN DEXTROSE 1-5 GM/200ML-% IV SOLN: 1000.0000 mg | Freq: Once | INTRAVENOUS | Status: DC

## 2017-02-16 MED ORDER — VANCOMYCIN HCL 10 G IV SOLR
1500.0000 mg | Freq: Once | INTRAVENOUS | Status: AC
  Administered 2017-02-16: 1500 mg via INTRAVENOUS
  Filled 2017-02-16: qty 1500

## 2017-02-16 MED ORDER — SODIUM CHLORIDE 0.9 % IV SOLN: 100.0000 mL | INTRAVENOUS | Status: DC | PRN

## 2017-02-16 MED ORDER — ALBUTEROL SULFATE (2.5 MG/3ML) 0.083% IN NEBU
INHALATION_SOLUTION | RESPIRATORY_TRACT | Status: AC
  Filled 2017-02-16: qty 6

## 2017-02-16 MED ORDER — ATORVASTATIN CALCIUM 20 MG PO TABS
20.0000 mg | ORAL_TABLET | Freq: Every day | ORAL | Status: DC
Start: 2017-02-16 — End: 2017-02-17
  Administered 2017-02-16: 20 mg via ORAL
  Filled 2017-02-16: qty 1

## 2017-02-16 MED ORDER — AMIODARONE HCL 200 MG PO TABS
200.0000 mg | ORAL_TABLET | Freq: Every day | ORAL | Status: DC
  Administered 2017-02-17 – 2017-02-22 (×6): 200 mg via ORAL
  Filled 2017-02-16 (×6): qty 1

## 2017-02-16 MED ORDER — HEPARIN SODIUM (PORCINE) 1000 UNIT/ML DIALYSIS: 40.0000 [IU]/kg | INTRAMUSCULAR | Status: DC | PRN

## 2017-02-16 MED ORDER — LIDOCAINE HCL (PF) 1 % IJ SOLN: 5.0000 mL | INTRAMUSCULAR | Status: DC | PRN

## 2017-02-16 MED ORDER — VANCOMYCIN HCL 10 G IV SOLR
1500.0000 mg | Freq: Once | INTRAVENOUS | Status: DC
  Filled 2017-02-16: qty 1500

## 2017-02-16 MED ORDER — METOPROLOL SUCCINATE ER 100 MG PO TB24
100.0000 mg | ORAL_TABLET | Freq: Every day | ORAL | Status: DC
Start: 2017-02-17 — End: 2017-02-17

## 2017-02-16 MED ORDER — INSULIN ASPART 100 UNIT/ML ~~LOC~~ SOLN
0.0000 [IU] | Freq: Three times a day (TID) | SUBCUTANEOUS | Status: DC
  Administered 2017-02-18: 2 [IU] via SUBCUTANEOUS
  Administered 2017-02-19: 3 [IU] via SUBCUTANEOUS
  Administered 2017-02-19: 5 [IU] via SUBCUTANEOUS
  Administered 2017-02-20: 3 [IU] via SUBCUTANEOUS
  Administered 2017-02-20: 2 [IU] via SUBCUTANEOUS
  Administered 2017-02-21: 3 [IU] via SUBCUTANEOUS
  Administered 2017-02-21: 1 [IU] via SUBCUTANEOUS
  Administered 2017-02-21 – 2017-02-22 (×2): 2 [IU] via SUBCUTANEOUS

## 2017-02-16 MED ORDER — CLONIDINE HCL 0.1 MG PO TABS
0.1000 mg | ORAL_TABLET | Freq: Two times a day (BID) | ORAL | Status: DC
  Administered 2017-02-16 – 2017-02-22 (×12): 0.1 mg via ORAL
  Filled 2017-02-16 (×12): qty 1

## 2017-02-16 MED ORDER — LIDOCAINE-PRILOCAINE 2.5-2.5 % EX CREA: 1.0000 "application " | TOPICAL_CREAM | CUTANEOUS | Status: DC | PRN

## 2017-02-16 MED ORDER — SODIUM CHLORIDE 0.9% FLUSH
3.0000 mL | Freq: Two times a day (BID) | INTRAVENOUS | Status: DC
  Administered 2017-02-17 – 2017-02-22 (×10): 3 mL via INTRAVENOUS

## 2017-02-16 MED ORDER — ALTEPLASE 2 MG IJ SOLR: 2.0000 mg | Freq: Once | INTRAMUSCULAR | Status: DC | PRN

## 2017-02-16 MED ORDER — PENTAFLUOROPROP-TETRAFLUOROETH EX AERO: 1.0000 "application " | INHALATION_SPRAY | CUTANEOUS | Status: DC | PRN

## 2017-02-16 MED ORDER — ACETAMINOPHEN 650 MG RE SUPP: 650.0000 mg | Freq: Four times a day (QID) | RECTAL | Status: DC | PRN

## 2017-02-16 MED ORDER — DEXTROSE 5 % IV SOLN: 2.0000 g | Freq: Once | INTRAVENOUS | Status: DC

## 2017-02-16 MED ORDER — VANCOMYCIN HCL IN DEXTROSE 750-5 MG/150ML-% IV SOLN
750.0000 mg | INTRAVENOUS | Status: DC
  Administered 2017-02-18: 750 mg via INTRAVENOUS
  Filled 2017-02-16: qty 150

## 2017-02-16 MED ORDER — CALCIUM ACETATE 667 MG PO CAPS: 667.0000 mg | ORAL_CAPSULE | Freq: Every day | ORAL | Status: DC

## 2017-02-16 MED ORDER — ASPIRIN 81 MG PO TBEC: 81.0000 mg | DELAYED_RELEASE_TABLET | Freq: Every day | ORAL | Status: DC

## 2017-02-16 MED ORDER — VANCOMYCIN HCL 1000 MG IV SOLR
1500.0000 mg | Freq: Once | INTRAVENOUS | Status: DC
  Filled 2017-02-16: qty 1500

## 2017-02-16 NOTE — Consult Note (Signed)
PULMONARY / CRITICAL CARE MEDICINE   Name: Angela Trujillo MRN: [id number] DOB: August 05, 1931    ADMISSION DATE:  02/16/2017 CONSULTATION DATE:  02/16/2017  REFERRING MD:  Dr. Emmaline Life  CHIEF COMPLAINT:  Shortness of breath  HISTORY OF PRESENT ILLNESS:   83 year old female with a past medical history significant for diastolic heart failure, hypertension and end-stage renal disease presented to the emergency department today after several days of itchy eyes, scratchy throat, sinus congestion and a dry cough. Today she developed significant shortness of breath after missing her dialysis session. She came to the emergency department and was placed on noninvasive mechanical ventilatory support and emergently admitted to the hospital for hemodialysis. She tells me that she has not been coughing up mucus nor has she had any sick contacts or fevers or chills. She says that she is feeling better with dialysis and BiPAP. Pulmonary and critical care medicine was consulted by the family practice teaching service for respiratory failure.  PAST MEDICAL HISTORY :  She  has a past medical history of Atrial fibrillation with RVR ([place]) (10/22/2016); Atrial flutter ([place]); Diabetes mellitus with end stage renal disease ([place]); Dyspnea; ESRD (end stage renal disease) on dialysis [facility]); GERD (gastroesophageal reflux disease); Hypertension; Pneumonia (10/2015); and Severe aortic stenosis (08/19/2016).  PAST SURGICAL HISTORY: She  has a past surgical history that includes Abdominal hysterectomy; Tubal ligation; Cardiac catheterization (N/A, 08/19/2016); ir generic historical (08/31/2016); ir generic historical (08/31/2016); Colonoscopy; Multiple extractions with alveoloplasty (N/A, 09/25/2016); Transcatheter aortic valve replacement, transfemoral (N/A, 09/29/2016); and TEE without cardioversion (N/A, 09/29/2016).  Allergies  Allergen Reactions  . Asa [Aspirin] Nausea Only and Other (See Comments)    MAKES STOMACH HURT  .  Codeine Rash  . Penicillins Rash     Has patient had a PCN reaction causing immediate rash, facial/tongue/throat swelling, SOB or lightheadedness with hypotension:  #  #  #  NO  #  #  #  Has patient had a PCN reaction causing severe rash involving mucus membranes or skin necrosis:  #  #  #  NO  #  #  # Has patient had a PCN reaction that required hospitalization:  #  #  #  NO  #  #  #  Has patient had a PCN reaction occurring within the last 10 years:  #  #  #  NO  #  #  #  If all answers are "NO", may proceed with Cephalosporin    No current facility-administered medications on file prior to encounter.    Current Outpatient Prescriptions on File Prior to Encounter  Medication Sig  . amiodarone (PACERONE) 200 MG tablet Take 1 tablet (200 mg total) by mouth daily.  Marland Kitchen aspirin EC 81 MG EC tablet Take 1 tablet (81 mg total) by mouth daily.  Marland Kitchen atorvastatin (LIPITOR) 20 MG tablet Take 20 mg by mouth at bedtime.  . calcium acetate (PHOSLO) 667 MG capsule Take 667 mg by mouth daily with breakfast.  . chlorhexidine (PERIDEX) 0.12 % solution Rinse with 15 mls twice daily for 30 seconds. Use after breakfast and at bedtime. Spit out excess. Do not swallow.  . cloNIDine (CATAPRES) 0.1 MG tablet Take 1 tablet (0.1 mg total) by mouth 2 (two) times daily.  . famotidine (PEPCID) 20 MG tablet Take 1 tablet (20 mg total) by mouth 2 (two) times daily.  . ferrous sulfate 325 (65 FE) MG tablet Take 325 mg by mouth 3 (three) times daily with meals.   . hydrALAZINE (APRESOLINE)  50 MG tablet Take 1 tablet (50 mg total) by mouth 3 (three) times daily.  Marland Kitchen HYDROcodone-acetaminophen (HYCET) 7.5-325 mg/15 ml solution Take 10-15 mLs by mouth every 6 (six) hours as needed for moderate pain or severe pain.  . Insulin Glargine (TOUJEO SOLOSTAR [place]) Inject 10 Units into the skin at bedtime.  . metoprolol succinate (TOPROL-XL) 100 MG 24 hr tablet Take 100 mg by mouth daily.  . Multiple Vitamins-Minerals (MULTIVITAMIN PO) Take 1  tablet by mouth daily.  . multivitamin (RENA-VIT) TABS tablet Take 1 tablet by mouth at bedtime.  . SENSIPAR 30 MG tablet Take 30 mg by mouth Every Tuesday,Thursday,and Saturday with dialysis.   Marland Kitchen warfarin (COUMADIN) 5 MG tablet TAKE AS DIRECTED BY THE COUMADIN CLINIC( 1 MONTH SUPPLY)    FAMILY HISTORY:  Her indicated that her mother is deceased. She indicated that her father is deceased. She indicated that the status of her neg hx is unknown.    SOCIAL HISTORY: She  reports that she has never smoked. She has never used smokeless tobacco. She reports that she does not drink alcohol or use drugs.  REVIEW OF SYSTEMS:   Gen: Denies fever, chills, weight change, fatigue, night sweats HEENT: Denies blurred vision, double vision, hearing loss, tinnitus, + sinus congestion, rhinorrhea, sore throat, neck stiffness, dysphagia PULM: per HPI CV: Denies chest pain, edema, orthopnea, paroxysmal nocturnal dyspnea, palpitations GI: Denies abdominal pain, nausea, vomiting, diarrhea, hematochezia, melena, constipation, change in bowel habits GU: Denies dysuria, hematuria, polyuria, oliguria, urethral discharge Endocrine: Denies hot or cold intolerance, polyuria, polyphagia or appetite change Derm: Denies rash, dry skin, scaling or peeling skin change Heme: Denies easy bruising, bleeding, bleeding gums Neuro: Denies headache, numbness, weakness, slurred speech, loss of memory or consciousness   SUBJECTIVE:  As above  VITAL SIGNS: BP (!) 162/79 (BP Location: Left Arm)   Pulse 73   Temp (!) 100.5 F (38.1 C) (Axillary)   Resp (!) 23   SpO2 100%   HEMODYNAMICS:    VENTILATOR SETTINGS:    INTAKE / OUTPUT: No intake/output data recorded.  PHYSICAL EXAMINATION: General:  Resting comfortably in bed Neuro:  Awake, alert, oriented 4 HEENT:  Normocephalic atraumatic, BiPAP mask in place Cardiovascular:  Regular rate and rhythm, JVD noted Lungs:  Wheezes bilaterally, normal respiratory  effort Abdomen:  Belly soft, nontender nondistended Musculoskeletal:  Normal bulk and tone Skin:  Right arm fistula, trace edema ankles  LABS:  BMET  Recent Labs Lab 02/16/17 1153  NA 134*  K 6.1*  CL 94*  CO2 12*  BUN 46*  CREATININE 10.64*  GLUCOSE 221*    Electrolytes  Recent Labs Lab 02/16/17 1153  CALCIUM 7.8*    CBC  Recent Labs Lab 02/16/17 1153  WBC 11.7*  HGB 9.6*  HCT 31.1*  PLT 148*    Coag's No results for input(s): APTT, INR in the last 168 hours.  Sepsis Markers  Recent Labs Lab 02/16/17 1504  LATICACIDVEN 10.04*    ABG  Recent Labs Lab 02/16/17 1409  PHART 7.401  PCO2ART 28.7*  PO2ART 115.0*    Liver Enzymes  Recent Labs Lab 02/16/17 1153  AST 288*  ALT 81*  ALKPHOS 184*  BILITOT 1.5*  ALBUMIN 3.2*    Cardiac Enzymes No results for input(s): TROPONINI, PROBNP in the last 168 hours.  Glucose No results for input(s): GLUCAP in the last 168 hours.  Imaging Dg Chest 2 View  Result Date: 02/16/2017 CLINICAL DATA:  Increased shortness of breath. EXAM: CHEST  2 VIEW COMPARISON:  10/22/2016. FINDINGS: Prior aortic valve repair. Cardiomegaly. Pulmonary vascular prominence. No pulmonary interstitial prominence. Interim clearing of previously identified interstitial edema from chest x-ray of 10/22/2016. No pleural effusion or pneumothorax. Right axillary stents again noted. IMPRESSION: 1. Prior cardiac valve repair. Cardiomegaly with mild pulmonary vascular prominence. 2. No evidence of pulmonary edema. Interim clearing of previously identified pulmonary interstitial edema from chest x-ray of 10/22/2016. Electronically Signed   By: Marcello Moores  Register   On: 02/16/2017 13:54   Independently reviewed chest x-ray images showing vascular prominence but no pulmonary edema or airspace disease    DISCUSSION: 83 year old female with a past medical history significant for aortic valve disease, hypertensive cardiomyopathy with grade 2  diastolic heart failure and A. fib presented to our facility with acute respiratory failure with hypoxemia. Based on her physical exam and clinical history the most likely explanation is acute pulmonary edema, though she does have persistent wheezing after allergy symptoms recently. She denies any history of lung disease. She also has a remarkably elevated lactic acid which is likely due to hypoxemia in the setting of respiratory failure and renal failure. She does not have other signs or symptoms worrisome for healthcare associated pneumonia or a systemic infection.  ASSESSMENT / PLAN:  PULMONARY A: Acute respiratory failure with hypoxemia Wheezing Acute vascular congestion/acute diastolic heart failure P:   Continue volume removal with dialysis Continue BiPAP for now, which changed to as needed after completion of dialysis We'll write an order for albuterol to be used as needed If wheezing persists consider checking respiratory viral panel and treating empirically with steroids for bronchospasm  CARDIOVASCULAR A:  Acute diastolic heart failure A. fib Hypertension Elevated lactic acid P:  Repeat lactic acid after dialysis Volume removal with hemodialysis  Agree with admission to stepdown  As her vital signs are currently normal and she is feeling better pulmonary and critical care medicine will sign off, call if questions  Roselie Awkward, MD [facility] Pager: [phone] Cell: [phone] After 3pm or if no response, call [phone]

## 2017-02-16 NOTE — H&P (Signed)
[facility] Admission History and Physical Service Pager: [phone]  Patient name: Angela Trujillo Medical record number: [id number] Date of birth: 10/09/1930 Age: 83 y.o. Gender: female  Primary Care Provider: Glendon Axe, MD Consultants:  Nephrology  Code Status: Full (Discussed on admission)   Chief Complaint: SOB   Assessment and Plan: Angela Trujillo is a 83 y.o. female presenting with respiratory distress. PMH is significant for ESRD, T2DM, thrombocytopenia, S/P TAVR,  HTN, HFpEF (G2DD), PAF   Acute Hypoxic Respiratory Distress likely 2/2 infection:  Patient presented tachypneic with fever to 101.2, white count elevated to 11.7 and elevated LA to 10.0 concerning for sepsis.  Is an ESRD patient on HD and missed today's session due to feeling ill.  Labs notable for hyperkalemia to 6.1, SCr to 10.64, LA 10.04, AST 288, ALT 81 and Alk phos 184.  ABG with pCO2 28.7, pO2 115, bicarb of 17.6 and pH 7.4.  Initial troponin 0.08. EKG without an changes from pervious. With worsening tachypnea, wheezing and shortness of breath in setting of elevated white count and LA, this appears more likely to be infectious. Started on Aztreonam and Vancomycin in ED.  Initially hypoxic and was placed on Bipap with some improvement in her respiratory distress.  She was not very responsive on arrival and appeared weak however by time of my exam was more alert on Bipap.   She was evaluated by Dr. Posey Pronto in the ED and taken for emergent HD.  -Admit to inpatient stepdown, attending  Dr. Ree Kida -Nephrology following, appreciate recs  -CCM consulted, appreciate recs  -Blood cultures and urine cultures pending -trend Lactic acid x 3  -Pro-calcitonin pending  -Albuterol neb Q4H PRN -Continue vancomycin and Aztreonam  -AM CBC, INR and BMET  -Vitals per floor   Transaminatis: AST 288, ALT 81.  Likely source due to infection vs medication - Amiodarone can cause transaminitis. -Hep B sAg pending    ESRD: T/Th/Sat; concern for electrolyte disturbance and fluid overload. Family notes she missed dialysis today due to not feeling well.   - Nephrology consulted in ED, appreciate recs - Emergent dialysis per Nephrology 5/29 -Continue daily Phoslo -Cinacalcet with HD -Daily BMET   T2DM: currently at home on Toujeo 10 units.  Last A1c 09/2016 was 8.5%.    - Currently NPO  - SSI and ACQHS CBG   HTN. On admission BP 177/86.  At home on Toprol XL 100 mg daily and Clonidine 0.1 mg BID.  -Continue Metoprolol and Clonidine  - Held hydralazine 50 mg TID   HFpEF: 10/2016 with an EF of 22-97%, grade 2 diastolic dysfunction, s/pt TAVR with bioprosthetic prosthesis, mild MV regurgitation, PA 50 mmHg  - Continue metoprolol 100 mg   PAF: Home meds include amiodarone, metoprolol, Coumadin, - Continue home amiodarone, metoprolol, Coumadin - Obtain PT/INR  HLD.  At home on Lipitor 20 mg daily.  -Continue home med  FEN/GI:  NPO, currently on BiPAP Prophylaxis: Heparin    Disposition: Pending clinical improvement   History of Present Illness:  Angela Trujillo is a 83 y.o. female presenting with respiratory distress. Patient developed a scratchy throat, congestion on 5/24. Patient worsened with cough - wet cough, yellow/brown sputum. Patient also indicated having a reduced appetite. Over the past couple of days. She states that over the course of the night she worsened, became more short of breath. This morning she developed wheezing. Her daughter states that she gave her an insurer and she was not able to keep it  down. She states that she was not able to go to dialysis because she felt so weak and was therefore brought into the ED.   Patient arrived at the ED, was tachypneic with RR to the 40s and desats into the 80s.  Was placed on BiPAP. ED provider started patient on vancomycin and aztreonam. Patient improved on BiPAP and was emergently taken upstairs for dialysis.   Review Of Systems: Per HPI with  the following additions:   Review of Systems  Constitutional: Positive for chills, fever and malaise/fatigue.  HENT: Positive for congestion.   Respiratory: Positive for cough, sputum production, shortness of breath and wheezing. Negative for hemoptysis.   Cardiovascular: Negative for chest pain and leg swelling.  Gastrointestinal: Positive for vomiting. Negative for abdominal pain, diarrhea and nausea.  Genitourinary: Negative for dysuria, frequency and urgency.  Musculoskeletal: Negative for myalgias and neck pain.  Skin: Negative for itching and rash.  Neurological: Negative for dizziness and headaches.   Patient Active Problem List   Diagnosis Date Noted  . Respiratory distress 02/16/2017  . Encounter for therapeutic drug monitoring 10/23/2016  . Atrial fibrillation with RVR ([place]) 10/22/2016  . Thrombocytopenia ([place]) 10/22/2016  . Elevated troponin 10/22/2016  . S/P TAVR (transcatheter aortic valve replacement) 10/04/2016  . Hypoglycemia 10/04/2016  . ESRD (end stage renal disease) ([place]) 10/04/2016  . Chronic diastolic CHF (congestive heart failure) ([place]) 10/04/2016  . Altered mental status   . Chronic periodontitis 09/23/2016  . Atrial flutter ([place])   . Normocytic anemia 04/15/2013  . Acute renal failure ([place]) 04/15/2013  . HTN (hypertension) 04/15/2013  . Diabetes mellitus, type II, insulin dependent ([place]) 04/15/2013    Past Medical History: Past Medical History:  Diagnosis Date  . Atrial fibrillation with RVR ([place]) 10/22/2016  . Atrial flutter ([place])   . Diabetes mellitus with end stage renal disease ([place])   . Dyspnea    with exertion  . ESRD (end stage renal disease) on dialysis ([place])    HD on T,T, Sa  . GERD (gastroesophageal reflux disease)   . Hypertension   . Pneumonia 10/2015  . Severe aortic stenosis 08/19/2016   s/p TAVR 09/2016 // b. Echo 09/30/16: EF 50-55, inf-septal AK, Gr 2 DD, normally functioning TAVR, mean AV gradient 15 mmHg, mod MAC, mild MS, mild  MR, trivial effusion    Past Surgical History: Past Surgical History:  Procedure Laterality Date  . ABDOMINAL HYSTERECTOMY    . CARDIAC CATHETERIZATION N/A 08/19/2016   Procedure: Right/Left Heart Cath and Coronary Angiography;  Surgeon: Sherren Mocha, MD;  Location: [place] [facility];  Service: Cardiovascular;  Laterality: N/A;  . COLONOSCOPY    . IR GENERIC HISTORICAL  08/31/2016   IR RADIOLOGY PERIPHERAL GUIDED IV START 08/31/2016 Corrie Mckusick, DO [facility]-INTER[facility]  . IR GENERIC HISTORICAL  08/31/2016   IR US GUIDE VASC ACCESS RIGHT 08/31/2016 Corrie Mckusick, DO [facility]  . MULTIPLE EXTRACTIONS WITH ALVEOLOPLASTY N/A 09/25/2016   Procedure: EXTRACTION of tooth number 31 WITH ALVEOLOPLASTY AND GROSS DEBRIDEMENT OF REMAINING TEETH;  Surgeon: Lenn Cal, DDS;  Location: [facility];  Service: Oral Surgery;  Laterality: N/A;  . TEE WITHOUT CARDIOVERSION N/A 09/29/2016   Procedure: TRANSESOPHAGEAL ECHOCARDIOGRAM (TEE);  Surgeon: Sherren Mocha, MD;  Location: [facility];  Service: Open Heart Surgery;  Laterality: N/A;  . TRANSCATHETER AORTIC VALVE REPLACEMENT, TRANSFEMORAL N/A 09/29/2016   Procedure: TRANSCATHETER AORTIC VALVE REPLACEMENT, TRANSFEMORAL;  Surgeon: Sherren Mocha, MD;  Location: [facility];  Service: Open Heart Surgery;  Laterality: N/A;  . TUBAL LIGATION      Social History: Social History  Substance Use Topics  . Smoking status: Never Smoker  . Smokeless tobacco: Never Used  . Alcohol use No   Additional social history:  Lives at home with her children.  They take turns living with her.  Please also refer to relevant sections of EMR.  Family History: Family History  Problem Relation Age of Onset  . CAD Neg Hx     Allergies and Medications: Allergies  Allergen Reactions  . Asa [Aspirin] Nausea Only and Other (See Comments)    MAKES STOMACH HURT  . Codeine Rash  . Penicillins Rash     Has patient had a PCN reaction causing immediate rash, facial/tongue/throat swelling,  SOB or lightheadedness with hypotension:  #  #  #  NO  #  #  #  Has patient had a PCN reaction causing severe rash involving mucus membranes or skin necrosis:  #  #  #  NO  #  #  # Has patient had a PCN reaction that required hospitalization:  #  #  #  NO  #  #  #  Has patient had a PCN reaction occurring within the last 10 years:  #  #  #  NO  #  #  #  If all answers are "NO", may proceed with Cephalosporin   No current facility-administered medications on file prior to encounter.    Current Outpatient Prescriptions on File Prior to Encounter  Medication Sig Dispense Refill  . amiodarone (PACERONE) 200 MG tablet Take 1 tablet (200 mg total) by mouth daily. 30 tablet 11  . aspirin EC 81 MG EC tablet Take 1 tablet (81 mg total) by mouth daily.    Marland Kitchen atorvastatin (LIPITOR) 20 MG tablet Take 20 mg by mouth at bedtime.    . calcium acetate (PHOSLO) 667 MG capsule Take 667 mg by mouth daily with breakfast.    . chlorhexidine (PERIDEX) 0.12 % solution Rinse with 15 mls twice daily for 30 seconds. Use after breakfast and at bedtime. Spit out excess. Do not swallow. 480 mL prn  . cloNIDine (CATAPRES) 0.1 MG tablet Take 1 tablet (0.1 mg total) by mouth 2 (two) times daily. 60 tablet 5  . famotidine (PEPCID) 20 MG tablet Take 1 tablet (20 mg total) by mouth 2 (two) times daily. 180 tablet 2  . ferrous sulfate 325 (65 FE) MG tablet Take 325 mg by mouth 3 (three) times daily with meals.     . hydrALAZINE (APRESOLINE) 50 MG tablet Take 1 tablet (50 mg total) by mouth 3 (three) times daily. 90 tablet 5  . HYDROcodone-acetaminophen (HYCET) 7.5-325 mg/15 ml solution Take 10-15 mLs by mouth every 6 (six) hours as needed for moderate pain or severe pain. 120 mL 0  . Insulin Glargine (TOUJEO SOLOSTAR [place]) Inject 10 Units into the skin at bedtime.    . metoprolol succinate (TOPROL-XL) 100 MG 24 hr tablet Take 100 mg by mouth daily.    . Multiple Vitamins-Minerals (MULTIVITAMIN PO) Take 1 tablet by mouth daily.    .  multivitamin (RENA-VIT) TABS tablet Take 1 tablet by mouth at bedtime.  0  . SENSIPAR 30 MG tablet Take 30 mg by mouth Every Tuesday,Thursday,and Saturday with dialysis.     Marland Kitchen warfarin (COUMADIN) 5 MG tablet TAKE AS DIRECTED BY THE COUMADIN CLINIC( 1 MONTH SUPPLY) 40 tablet 2   Objective: BP (!) 154/85   Pulse  76   Temp (!) 100.5 F (38.1 C) (Axillary)   Resp (!) 23   SpO2 100%  Exam: General: 83 yo F, on Bipap, in mild distress Eyes: EOMI, PERRL ENTM: MMM, o/p clear Neck: supple, no JVD  Cardiovascular: RRR no MRG Respiratory: Noisy breathing , wheezing noted, mild increase in work of breathing  Gastrointestinal: soft, nontender, nondistended, +bs MSK: bilateral pitting edema to the legs  Derm: warm, dry, no ulcers or rashes noted  Neuro:  5/5 strength bilaterally in upper and lower extremities, alert and orientated to self, place, year ( missed the month) Psych: Normal affect and cognition   Labs and Imaging: CBC BMET   Recent Labs Lab 02/16/17 1153  WBC 11.7*  HGB 9.6*  HCT 31.1*  PLT 148*    Recent Labs Lab 02/16/17 1153  NA 134*  K 6.1*  CL 94*  CO2 12*  BUN 46*  CREATININE 10.64*  GLUCOSE 221*  CALCIUM 7.8*     Dg Chest 2 View  Result Date: 02/16/2017 CLINICAL DATA:  Increased shortness of breath. EXAM: CHEST  2 VIEW COMPARISON:  10/22/2016. FINDINGS: Prior aortic valve repair. Cardiomegaly. Pulmonary vascular prominence. No pulmonary interstitial prominence. Interim clearing of previously identified interstitial edema from chest x-ray of 10/22/2016. No pleural effusion or pneumothorax. Right axillary stents again noted. IMPRESSION: 1. Prior cardiac valve repair. Cardiomegaly with mild pulmonary vascular prominence. 2. No evidence of pulmonary edema. Interim clearing of previously identified pulmonary interstitial edema from chest x-ray of 10/22/2016. Electronically Signed   By: Marcello Moores  Register   On: 02/16/2017 13:54    Lovenia Kim, MD 02/16/2017, 7:40  PM PGY-1, [facility] pager: [phone], text pages welcome  UPPER LEVEL ADDENDUM  I have read the above note and made revisions highlighted in blue.  Kerrin Mo, MD, PGY-2 [facility]

## 2017-02-16 NOTE — ED Provider Notes (Addendum)
[facility] DEPT Provider Note   CSN: [id number] Arrival date & time: 02/16/17  1141     History   Chief Complaint Chief Complaint  Patient presents with  . Shortness of Breath   Level V caveat due to acuity of condition. HPI Angela Trujillo is a 83 y.o. female with a past medical history of end-stage renal disease, diabetes, CAD who presents the emergency department in respiratory distress. She dialyzes Tuesday, Thursday, Saturday in [place]. Her children state that she has been dialyzing on schedule. Her daughter states that she has developed a cough since this past Saturday. She thought it might be exposure to pollen, but denies a history of asthma, or previous allergies. The patient began having difficulty breathing 3 days ago. She has had progressively worsening shortness of breath, wheezing and tachypnea. Her family brought her in for evaluation today. The states she had not been having any fevers. Patient is able to respond in short, but appropriate responses.  HPI  Past Medical History:  Diagnosis Date  . Atrial fibrillation with RVR ([place]) 10/22/2016  . Atrial flutter ([place])   . Diabetes mellitus with end stage renal disease ([place])   . Dyspnea    with exertion  . ESRD (end stage renal disease) on dialysis ([place])    HD on T,T, Sa  . GERD (gastroesophageal reflux disease)   . Hypertension   . Pneumonia 10/2015  . Severe aortic stenosis 08/19/2016   s/p TAVR 09/2016 // b. Echo 09/30/16: EF 50-55, inf-septal AK, Gr 2 DD, normally functioning TAVR, mean AV gradient 15 mmHg, mod MAC, mild MS, mild MR, trivial effusion    Patient Active Problem List   Diagnosis Date Noted  . Encounter for therapeutic drug monitoring 10/23/2016  . Atrial fibrillation with RVR ([place]) 10/22/2016  . Thrombocytopenia ([place]) 10/22/2016  . Elevated troponin 10/22/2016  . S/P TAVR (transcatheter aortic valve replacement) 10/04/2016  . Hypoglycemia 10/04/2016  . ESRD (end stage renal disease) ([place])  10/04/2016  . Chronic diastolic CHF (congestive heart failure) ([place]) 10/04/2016  . Altered mental status   . Chronic periodontitis 09/23/2016  . Atrial flutter ([place])   . Normocytic anemia 04/15/2013  . Acute renal failure ([place]) 04/15/2013  . HTN (hypertension) 04/15/2013  . Diabetes mellitus, type II, insulin dependent ([place]) 04/15/2013    Past Surgical History:  Procedure Laterality Date  . ABDOMINAL HYSTERECTOMY    . CARDIAC CATHETERIZATION N/A 08/19/2016   Procedure: Right/Left Heart Cath and Coronary Angiography;  Surgeon: Sherren Mocha, MD;  Location: [facility];  Service: Cardiovascular;  Laterality: N/A;  . COLONOSCOPY    . IR GENERIC HISTORICAL  08/31/2016   IR RADIOLOGY PERIPHERAL GUIDED IV START 08/31/2016 Corrie Mckusick, DO [facility]-INTERV RAD  . IR GENERIC HISTORICAL  08/31/2016   IR US GUIDE VASC ACCESS RIGHT 08/31/2016 Corrie Mckusick, DO [facility]  . MULTIPLE EXTRACTIONS WITH ALVEOLOPLASTY N/A 09/25/2016   Procedure: EXTRACTION of tooth number 31 WITH ALVEOLOPLASTY AND GROSS DEBRIDEMENT OF REMAINING TEETH;  Surgeon: Lenn Cal, DDS;  Location: [facility];  Service: Oral Surgery;  Laterality: N/A;  . TEE WITHOUT CARDIOVERSION N/A 09/29/2016   Procedure: TRANSESOPHAGEAL ECHOCARDIOGRAM (TEE);  Surgeon: Sherren Mocha, MD;  Location: [facility];  Service: Open Heart Surgery;  Laterality: N/A;  . TRANSCATHETER AORTIC VALVE REPLACEMENT, TRANSFEMORAL N/A 09/29/2016   Procedure: TRANSCATHETER AORTIC VALVE REPLACEMENT, TRANSFEMORAL;  Surgeon: Sherren Mocha, MD;  Location: [facility];  Service: Open Heart Surgery;  Laterality: N/A;  . TUBAL LIGATION  OB History    No data available       Home Medications    Prior to Admission medications   Medication Sig Start Date End Date Taking? Authorizing Provider  amiodarone (PACERONE) 200 MG tablet Take 1 tablet (200 mg total) by mouth daily. 11/27/16   Sherren Mocha, MD  aspirin EC 81 MG EC tablet Take 1 tablet (81 mg total) by mouth  daily. 10/05/16   Richardson Dopp T, PA-C  atorvastatin (LIPITOR) 20 MG tablet Take 20 mg by mouth at bedtime. 06/04/16   [provider]  calcium acetate (PHOSLO) 667 MG capsule Take 667 mg by mouth daily with breakfast.    [provider]  chlorhexidine (PERIDEX) 0.12 % solution Rinse with 15 mls twice daily for 30 seconds. Use after breakfast and at bedtime. Spit out excess. Do not swallow. 09/23/16   Lenn Cal, DDS  cloNIDine (CATAPRES) 0.1 MG tablet Take 1 tablet (0.1 mg total) by mouth 2 (two) times daily. 10/04/16   Richardson Dopp T, PA-C  famotidine (PEPCID) 20 MG tablet Take 1 tablet (20 mg total) by mouth 2 (two) times daily. 02/08/17   Sherren Mocha, MD  ferrous sulfate 325 (65 FE) MG tablet Take 325 mg by mouth 3 (three) times daily with meals.     [provider]  hydrALAZINE (APRESOLINE) 50 MG tablet Take 1 tablet (50 mg total) by mouth 3 (three) times daily. 10/04/16   Richardson Dopp T, PA-C  HYDROcodone-acetaminophen (HYCET) 7.5-325 mg/15 ml solution Take 10-15 mLs by mouth every 6 (six) hours as needed for moderate pain or severe pain. 09/25/16 09/25/17  Lenn Cal, DDS  Insulin Glargine (TOUJEO SOLOSTAR [place]) Inject 10 Units into the skin at bedtime.    [provider]  metoprolol succinate (TOPROL-XL) 100 MG 24 hr tablet Take 100 mg by mouth daily. 06/29/16   [provider]  Multiple Vitamins-Minerals (MULTIVITAMIN PO) Take 1 tablet by mouth daily.    [provider]  multivitamin (RENA-VIT) TABS tablet Take 1 tablet by mouth at bedtime. 10/04/16   Richardson Dopp T, PA-C  SENSIPAR 30 MG tablet Take 30 mg by mouth Every Tuesday,Thursday,and Saturday with dialysis.  08/06/16   [provider]  warfarin (COUMADIN) 5 MG tablet TAKE AS DIRECTED BY THE COUMADIN CLINIC( 1 MONTH SUPPLY) 01/13/17   Sherren Mocha, MD    Family History Family History  Problem Relation Age of Onset  . CAD Neg Hx     Social History Social  History  Substance Use Topics  . Smoking status: Never Smoker  . Smokeless tobacco: Never Used  . Alcohol use No     Allergies   Asa [aspirin]; Codeine; and Penicillins   Review of Systems Review of Systems  Unable to perform ROS: Acuity of condition     Physical Exam Updated Vital Signs BP (!) 178/82   Pulse 78   Temp (!) 101.2 F (38.4 C) (Rectal)   Resp (!) 30   SpO2 100%   Physical Exam  Constitutional: She is oriented to person, place, and time. She appears well-developed.  Patient appears very ill, very weak, breathing rapidly. She is extremely Hot and radiates warmth.  HENT:  Head: Normocephalic and atraumatic.  Eyes: EOM are normal. Pupils are equal, round, and reactive to light.  Neck: Normal range of motion. Neck supple. No JVD present.  Cardiovascular: Normal rate and regular rhythm.   Pulmonary/Chest: She is in respiratory distress. She has no wheezes.  TACHYPNEA  Abdominal: Soft. She exhibits no distension. There is no tenderness. There is no guarding.  Musculoskeletal: She exhibits no tenderness.  Neurological: She is alert and oriented to person, place, and time.  Skin: Capillary refill takes less than 2 seconds. No rash noted.  Nursing note and vitals reviewed.    ED Treatments / Results  Labs (all labs ordered are listed, but only abnormal results are displayed) Labs Reviewed  COMPREHENSIVE METABOLIC PANEL - Abnormal; Notable for the following:       Result Value   Sodium 134 (*)    Potassium 6.1 (*)    Chloride 94 (*)    CO2 12 (*)    Glucose, Bld 221 (*)    BUN 46 (*)    Creatinine, Ser 10.64 (*)    Calcium 7.8 (*)    Albumin 3.2 (*)    AST 288 (*)    ALT 81 (*)    Alkaline Phosphatase 184 (*)    Total Bilirubin 1.5 (*)    GFR calc non Af Amer 3 (*)    GFR calc Af Amer 3 (*)    Anion gap 28 (*)    All other components within normal limits  CBC WITH DIFFERENTIAL/PLATELET - Abnormal; Notable for the following:    WBC 11.7 (*)     RBC 3.21 (*)    Hemoglobin 9.6 (*)    HCT 31.1 (*)    RDW 18.2 (*)    Platelets 148 (*)    nRBC 3 (*)    Neutro Abs 9.5 (*)    All other components within normal limits  I-STAT ARTERIAL BLOOD GAS, ED - Abnormal; Notable for the following:    pCO2 arterial 28.7 (*)    pO2, Arterial 115.0 (*)    Bicarbonate 17.6 (*)    Acid-base deficit 6.0 (*)    All other components within normal limits  CULTURE, BLOOD (ROUTINE X 2)  CULTURE, BLOOD (ROUTINE X 2)  URINALYSIS, ROUTINE W REFLEX MICROSCOPIC  I-STAT TROPOININ, ED  I-STAT CG4 LACTIC ACID, ED    EKG  EKG Interpretation None       Radiology Dg Chest 2 View  Result Date: 02/16/2017 CLINICAL DATA:  Increased shortness of breath. EXAM: CHEST  2 VIEW COMPARISON:  10/22/2016. FINDINGS: Prior aortic valve repair. Cardiomegaly. Pulmonary vascular prominence. No pulmonary interstitial prominence. Interim clearing of previously identified interstitial edema from chest x-ray of 10/22/2016. No pleural effusion or pneumothorax. Right axillary stents again noted. IMPRESSION: 1. Prior cardiac valve repair. Cardiomegaly with mild pulmonary vascular prominence. 2. No evidence of pulmonary edema. Interim clearing of previously identified pulmonary interstitial edema from chest x-ray of 10/22/2016. Electronically Signed   By: Marcello Moores  Register   On: 02/16/2017 13:54    Procedures .Critical Care Performed by: Margarita Mail Authorized by: Margarita Mail   Critical care provider statement:    Critical care time (minutes):  75   Critical care was necessary to treat or prevent imminent or life-threatening deterioration of the following conditions:  Metabolic crisis, renal failure, respiratory failure and sepsis   Critical care was time spent personally by me on the following activities:  Development of treatment plan with patient or surrogate, discussions with consultants, evaluation of patient's response to treatment, examination of patient,  interpretation of cardiac output measurements, obtaining history from patient or surrogate, review of old charts, re-evaluation of patient's condition, pulse oximetry, ordering and review of radiographic studies, ordering and review of laboratory studies and ordering and performing treatments and interventions   (  including critical care time)  Medications Ordered in ED Medications  albuterol (PROVENTIL) (2.5 MG/3ML) 0.083% nebulizer solution (not administered)  vancomycin (VANCOCIN) 1,500 mg in sodium chloride 0.9 % 500 mL IVPB (not administered)  aztreonam (AZACTAM) 500 mg in dextrose 5 % 50 mL IVPB (not administered)  albuterol (PROVENTIL) (2.5 MG/3ML) 0.083% nebulizer solution 5 mg (5 mg Nebulization Given 02/16/17 1159)     Initial Impression / Assessment and Plan / ED Course  I have reviewed the triage vital signs and the nursing notes.  Pertinent labs & imaging results that were available during my care of the patient were reviewed by me and considered in my medical decision making (see chart for details).  Clinical Course as of Feb 16 1450  Tue Feb 16, 2017  1310 Patient in respiratory distress. I have ordered BiPAP and arterial blood gas as the patient is in extremis.  [AH]  1311 Potassium: (!) 6.1 [AH]  1447 Temp: (!) 101.2 F (38.4 C) [AH]  1447 AST: (!) 288 [AH]  1447 ALT: (!) 81 [AH]  1447 Alkaline Phosphatase: (!) 184 [AH]  1447 Total Bilirubin: (!) 1.5 [AH]  1448 Patient with transaminitis. This may representhepatorenal syndrome. Albumin: (!) 3.2 [AH]  1448 WBC: (!) 11.7 [AH]  1448 Patient is clinically volume overloaded, but fits sepsis criteria. I have ordered her Azactam and vancomycin. Within the first hour of my assessment of the patient, however, the patient only has one arm, from which IV can be established, and was difficulty accessing this. The IV team did show up. However, there was delay in starting antibiotics secondary to this.  NEUT#: (!) 9.5 [AH]      Clinical Course User Index [AH] Margarita Mail, PA-C    Patient with respiratory distress. The patient has been seen in shared visit with Dr. Jack Quarto. Patient will be admitted by the family medicine residents. I suspect she will go to stepdown. She does seem to be improving significantly with BiPAP and is more alert and working last brief period. Dr. Posey Pronto and I have spoken as I think she will need emergent dialysis. I have a high suspicion for underlying pneumonia and lactic acidosis, although her lactate is currently pending. She does not have hypercarbia. I doubt salicylate this any more alcohol as the underlying cause.  Final Clinical Impressions(s) / ED Diagnoses   Final diagnoses:  Respiratory distress  High anion gap metabolic acidosis    New Prescriptions New Prescriptions   No medications on file     Ned Grace 02/16/17 1627    Davonna Belling, MD 02/21/17 [place], [place], PA-C 03/17/17 1634    Davonna Belling, MD 03/17/17 [phone]

## 2017-02-16 NOTE — ED Notes (Signed)
Patient is breathing much better on bipap. Family remains at bedside. Attempted IV with patient only have 1 ext to stick this RN was able to find anything , IV team called.

## 2017-02-16 NOTE — Consult Note (Signed)
Reason for Consult: Hyperkalemia, volume overload in patient with ESRD on hemodialysis Referring Physician: Davonna Belling M.D. (EDP)  HPI:  83 year old African-American woman with past medical history significant for severe aortic stenosis status post TAVR in January, 2018, history of atrial fibrillation with RVR, diabetes mellitus, hypertension and end-stage renal disease on hemodialysis on a Tuesday/Thursday/Saturday schedule.  Brought to the emergency room with 3 day history of worsening URI and then LRTI symptoms with some cough and sputum production but no documented fever at home-here found to have fever by rectal temperature. Some nausea and vomiting reported earlier today but no diarrhea. She last went to her dialysis on Saturday as scheduled and completed her entire treatment uneventfully. No clear dietary indiscretion over the Memorial Day weekend. Concern raised with evidence of volume overload and hyperkalemia in the emergency room prompting need for urgent hemodialysis.   Past Medical History:  Diagnosis Date  . Atrial fibrillation with RVR ([place]) 10/22/2016  . Atrial flutter ([place])   . Diabetes mellitus with end stage renal disease ([place])   . Dyspnea    with exertion  . ESRD (end stage renal disease) on dialysis ([place])    HD on T,T, Sa  . GERD (gastroesophageal reflux disease)   . Hypertension   . Pneumonia 10/2015  . Severe aortic stenosis 08/19/2016   s/p TAVR 09/2016 // b. Echo 09/30/16: EF 50-55, inf-septal AK, Gr 2 DD, normally functioning TAVR, mean AV gradient 15 mmHg, mod MAC, mild MS, mild MR, trivial effusion    Past Surgical History:  Procedure Laterality Date  . ABDOMINAL HYSTERECTOMY    . CARDIAC CATHETERIZATION N/A 08/19/2016   Procedure: Right/Left Heart Cath and Coronary Angiography;  Surgeon: Sherren Mocha, MD;  Location: [facility];  Service: Cardiovascular;  Laterality: N/A;  . COLONOSCOPY    . IR GENERIC HISTORICAL  08/31/2016   IR RADIOLOGY  PERIPHERAL GUIDED IV START 08/31/2016 Corrie Mckusick, DO [facility]  . IR GENERIC HISTORICAL  08/31/2016   IR US GUIDE VASC ACCESS RIGHT 08/31/2016 Corrie Mckusick, DO [facility]  . MULTIPLE EXTRACTIONS WITH ALVEOLOPLASTY N/A 09/25/2016   Procedure: EXTRACTION of tooth number 31 WITH ALVEOLOPLASTY AND GROSS DEBRIDEMENT OF REMAINING TEETH;  Surgeon: Lenn Cal, DDS;  Location: [facility];  Service: Oral Surgery;  Laterality: N/A;  . TEE WITHOUT CARDIOVERSION N/A 09/29/2016   Procedure: TRANSESOPHAGEAL ECHOCARDIOGRAM (TEE);  Surgeon: Sherren Mocha, MD;  Location: [facility];  Service: Open Heart Surgery;  Laterality: N/A;  . TRANSCATHETER AORTIC VALVE REPLACEMENT, TRANSFEMORAL N/A 09/29/2016   Procedure: TRANSCATHETER AORTIC VALVE REPLACEMENT, TRANSFEMORAL;  Surgeon: Sherren Mocha, MD;  Location: [facility];  Service: Open Heart Surgery;  Laterality: N/A;  . TUBAL LIGATION      Family History  Problem Relation Age of Onset  . CAD Neg Hx     Social History:  reports that she has never smoked. She has never used smokeless tobacco. She reports that she does not drink alcohol or use drugs.  Allergies:  Allergies  Allergen Reactions  . Asa [Aspirin] Nausea Only and Other (See Comments)    MAKES STOMACH HURT  . Codeine Rash  . Penicillins Rash     Has patient had a PCN reaction causing immediate rash, facial/tongue/throat swelling, SOB or lightheadedness with hypotension:  #  #  #  NO  #  #  #  Has patient had a PCN reaction causing severe rash involving mucus membranes or skin necrosis:  #  #  #  NO  #  #  #  Has patient had a PCN reaction that required hospitalization:  #  #  #  NO  #  #  #  Has patient had a PCN reaction occurring within the last 10 years:  #  #  #  NO  #  #  #  If all answers are "NO", may proceed with Cephalosporin    Medications:  Scheduled: . albuterol       BMP Latest Ref Rng & Units 02/16/2017 10/23/2016 10/22/2016  Glucose 65 - 99 mg/dL 221(H) 109(H) 116(H)  BUN 6 - 20 mg/dL  46(H) 29(H) 17  Creatinine 0.44 - 1.00 mg/dL 10.64(H) 6.21(H) 4.46(H)  Sodium 135 - 145 mmol/L 134(L) 137 138  Potassium 3.5 - 5.1 mmol/L 6.1(H) 4.4 3.8  Chloride 101 - 111 mmol/L 94(L) 101 101  CO2 22 - 32 mmol/L 12(L) 24 23  Calcium 8.9 - 10.3 mg/dL 7.8(L) 8.8(L) 9.0   CBC Latest Ref Rng & Units 02/16/2017 10/23/2016 10/22/2016  WBC 4.0 - 10.5 K/uL 11.7(H) 9.8 13.7(H)  Hemoglobin 12.0 - 15.0 g/dL 9.6(L) 8.5(L) 9.0(L)  Hematocrit 36.0 - 46.0 % 31.1(L) 26.0(L) 27.1(L)  Platelets 150 - 400 K/uL 148(L) 114(L) 110(L)   Dg Chest 2 View  Result Date: 02/16/2017 CLINICAL DATA:  Increased shortness of breath. EXAM: CHEST  2 VIEW COMPARISON:  10/22/2016. FINDINGS: Prior aortic valve repair. Cardiomegaly. Pulmonary vascular prominence. No pulmonary interstitial prominence. Interim clearing of previously identified interstitial edema from chest x-ray of 10/22/2016. No pleural effusion or pneumothorax. Right axillary stents again noted. IMPRESSION: 1. Prior cardiac valve repair. Cardiomegaly with mild pulmonary vascular prominence. 2. No evidence of pulmonary edema. Interim clearing of previously identified pulmonary interstitial edema from chest x-ray of 10/22/2016. Electronically Signed   By: Marcello Moores  Register   On: 02/16/2017 13:54    Review of Systems  Unable to perform ROS: Medical condition (on NIPPV)   Blood pressure (!) 178/82, pulse 78, temperature (!) 101.2 F (38.4 C), temperature source Rectal, resp. rate (!) 30, SpO2 100 %. Physical Exam  Nursing note and vitals reviewed. Constitutional: She appears well-developed.  Appears to be uncomfortable on BiPAP  HENT:  Head: Normocephalic and atraumatic.  Mouth/Throat: Oropharynx is clear and moist.  Eyes: EOM are normal.  Neck: Normal range of motion. Neck supple. JVD present.  Jugular veins distended to mandible  Cardiovascular: Exam reveals no friction rub.   Murmur heard. Respiratory: Effort normal. She has rales.  Fine rales bilaterally  with intermittent expiratory wheeze  GI: Soft. Bowel sounds are normal. She exhibits no distension. There is no tenderness.  Musculoskeletal: She exhibits edema.  Neurological: She is alert.  Skin: Skin is warm and dry.    Assessment/Plan: 1. Respiratory distress: Appears to be from a combination of volume overload given findings from physical exam and possibly pneumonia as to the presence of fever/chest x-ray findings/leukocytosis. Agree with broad-spectrum coverage for healthcare associated pneumonia and will undertake emergent hemodialysis for ultrafiltration.  2. Hyperkalemia: Emergent hemodialysis today for potassium lowering 3. End-stage renal disease: Hemodialysis today to continue her usual outpatient Tuesday/Thursday/Saturday schedule. Acute indications today include her volume overload and hyperkalemia. 4. Hypertension: Likely compounded by respiratory distress as well as volume overload-emergent hemodialysis to be undertaken today. Monitor with resumption of oral antihypertensive agents. 5. Anemia: Consistent with anemia of chronic kidney disease, review records to decide on ESA dosing. 6. Metabolic bone disease: Resume phosphorus binders when she is able to take orally after resolution of acute respiratory distress. Reconcile medications for vitamin  D receptor analog for PTH control.   Melitza Metheny K. 02/16/2017, 2:57 PM

## 2017-02-16 NOTE — Progress Notes (Signed)
Pharmacy Antibiotic Note  Angela Trujillo is a 83 y.o. female admitted on 02/16/2017 with pneumonia.  Pharmacy has been consulted for vancomycin and aztreonam dosing. Of note, patient with rash to PCN. Tmax 101.2, WBC 11.7, patient has ESRD on HD-TTS >> to go to HD today  Plan: Vancomycin 1500 mg x1 then 750 mg IV qHD-TTS Aztreonam 500mg  IV Q12H Follow HD schedule/tolerability, cultures, LOT     Temp (24hrs), Avg:99.4 F (37.4 C), Min:97.6 F (36.4 C), Max:101.2 F (38.4 C)   Recent Labs Lab 02/16/17 1153  WBC 11.7*  CREATININE 10.64*    Estimated Creatinine Clearance: 3.5 mL/min (A) (by C-G formula based on SCr of 10.64 mg/dL (H)).    Allergies  Allergen Reactions  . Asa [Aspirin] Nausea Only and Other (See Comments)    MAKES STOMACH HURT  . Codeine Rash  . Penicillins Rash     Has patient had a PCN reaction causing immediate rash, facial/tongue/throat swelling, SOB or lightheadedness with hypotension:  #  #  #  NO  #  #  #  Has patient had a PCN reaction causing severe rash involving mucus membranes or skin necrosis:  #  #  #  NO  #  #  # Has patient had a PCN reaction that required hospitalization:  #  #  #  NO  #  #  #  Has patient had a PCN reaction occurring within the last 10 years:  #  #  #  NO  #  #  #  If all answers are "NO", may proceed with Cephalosporin    Antimicrobials this admission: 5/29 vanc>> 5/29 aztreonam>>  Microbiology results: 5/29 BCx:   Thank you for allowing pharmacy to be a part of this patient's care.  Shelda Altes 02/16/2017 1:55 PM

## 2017-02-16 NOTE — Telephone Encounter (Signed)
New message    Pt son is calling about pt. He said he wants to inform RN that she is going to the hospital. He would like a call back.

## 2017-02-16 NOTE — Telephone Encounter (Signed)
I spoke with Gwyndolyn Saxon and he wanted to make Dr Burt Knack aware that the pt is in the [facility] ER due to SOB.  I advised him that I will send a message to Dr Burt Knack.

## 2017-02-16 NOTE — ED Triage Notes (Addendum)
Pt brought in by family. She is t/th/s dialysis pt. Pt has had increased shortness of breath for several days. Accessory muscle usage noted. Pt appears lethargic as well. Some wheezing noted.

## 2017-02-16 NOTE — Progress Notes (Signed)
ANTICOAGULATION CONSULT NOTE - Initial Consult  Pharmacy Consult for Coumadin Indication: atrial fibrillation  Allergies  Allergen Reactions  . Asa [Aspirin] Nausea Only and Other (See Comments)    MAKES STOMACH HURT  . Codeine Rash  . Penicillins Rash     Has patient had a PCN reaction causing immediate rash, facial/tongue/throat swelling, SOB or lightheadedness with hypotension:  #  #  #  NO  #  #  #  Has patient had a PCN reaction causing severe rash involving mucus membranes or skin necrosis:  #  #  #  NO  #  #  # Has patient had a PCN reaction that required hospitalization:  #  #  #  NO  #  #  #  Has patient had a PCN reaction occurring within the last 10 years:  #  #  #  NO  #  #  #  If all answers are "NO", may proceed with Cephalosporin   Assessment: 83 yo F presents on 5/29 with SOB. Hx of ESRD and Afib on Coumadin 5mg  daily. INR was low on last anticoag clinic visit on 5/21. No INR drawn on admit and went to emergent HD which may impact INR draw so will just plan to check INR with am labs. Not very responsive so doesn't know when last dose was. Hgb 9.6, plts 148.  Goal of Therapy:  INR 2-3 Monitor platelets by anticoagulation protocol: Yes   Plan:  Hold dose tonight Monitor daily INR, CBC, s/s of bleed  Elenor Quinones, PharmD, [facility] Clinical Pharmacist Pager [phone] 02/16/2017 4:27 PM

## 2017-02-16 NOTE — Procedures (Signed)
Patient seen on Hemodialysis. QB 400, UF goal 4.5L Treatment adjusted as needed.  Angela Shiley MD [facility]. Office # [phone] Pager # [phone] 3:46 PM

## 2017-02-17 LAB — PROTIME-INR
INR: 3.57
INR: 3.39
Prothrombin Time: 36.6 seconds — ABNORMAL HIGH (ref 11.4–15.2)
Prothrombin Time: 35 seconds — ABNORMAL HIGH (ref 11.4–15.2)

## 2017-02-17 LAB — CBC
HCT: 26.2 % — ABNORMAL LOW (ref 36.0–46.0)
HEMOGLOBIN: 8.5 g/dL — AB (ref 12.0–15.0)
MCH: 29.4 pg (ref 26.0–34.0)
MCHC: 32.4 g/dL (ref 30.0–36.0)
MCV: 90.7 fL (ref 78.0–100.0)
Platelets: 127 10*3/uL — ABNORMAL LOW (ref 150–400)
RBC: 2.89 MIL/uL — ABNORMAL LOW (ref 3.87–5.11)
RDW: 17.6 % — ABNORMAL HIGH (ref 11.5–15.5)
WBC: 11.8 10*3/uL — ABNORMAL HIGH (ref 4.0–10.5)

## 2017-02-17 LAB — MAGNESIUM: Magnesium: 2.1 mg/dL (ref 1.7–2.4)

## 2017-02-17 LAB — COMPREHENSIVE METABOLIC PANEL
ALBUMIN: 2.7 g/dL — AB (ref 3.5–5.0)
ALT: 107 U/L — ABNORMAL HIGH (ref 14–54)
ALT: 108 U/L — ABNORMAL HIGH (ref 14–54)
ANION GAP: 12 (ref 5–15)
AST: 230 U/L — ABNORMAL HIGH (ref 15–41)
AST: 205 U/L — AB (ref 15–41)
Albumin: 2.8 g/dL — ABNORMAL LOW (ref 3.5–5.0)
Alkaline Phosphatase: 165 U/L — ABNORMAL HIGH (ref 38–126)
Alkaline Phosphatase: 167 U/L — ABNORMAL HIGH (ref 38–126)
Anion gap: 12 (ref 5–15)
BILIRUBIN TOTAL: 1 mg/dL (ref 0.3–1.2)
BUN: 19 mg/dL (ref 6–20)
BUN: 25 mg/dL — AB (ref 6–20)
CALCIUM: 7.7 mg/dL — AB (ref 8.9–10.3)
CHLORIDE: 97 mmol/L — AB (ref 101–111)
CO2: 26 mmol/L (ref 22–32)
CO2: 26 mmol/L (ref 22–32)
Calcium: 7.5 mg/dL — ABNORMAL LOW (ref 8.9–10.3)
Chloride: 97 mmol/L — ABNORMAL LOW (ref 101–111)
Creatinine, Ser: 5.45 mg/dL — ABNORMAL HIGH (ref 0.44–1.00)
Creatinine, Ser: 6.35 mg/dL — ABNORMAL HIGH (ref 0.44–1.00)
GFR calc Af Amer: 6 mL/min — ABNORMAL LOW (ref >60)
GFR calc non Af Amer: 6 mL/min — ABNORMAL LOW (ref >60)
GFR calc non Af Amer: 5 mL/min — ABNORMAL LOW (ref >60)
GFR, EST AFRICAN AMERICAN: 8 mL/min — AB (ref >60)
GLUCOSE: 96 mg/dL (ref 65–99)
Glucose, Bld: 119 mg/dL — ABNORMAL HIGH (ref 65–99)
POTASSIUM: 4.1 mmol/L (ref 3.5–5.1)
Potassium: 3.9 mmol/L (ref 3.5–5.1)
Sodium: 135 mmol/L (ref 135–145)
Sodium: 135 mmol/L (ref 135–145)
TOTAL PROTEIN: 6.3 g/dL — AB (ref 6.5–8.1)
Total Bilirubin: 0.9 mg/dL (ref 0.3–1.2)
Total Protein: 6.1 g/dL — ABNORMAL LOW (ref 6.5–8.1)

## 2017-02-17 LAB — GLUCOSE, CAPILLARY
GLUCOSE-CAPILLARY: 112 mg/dL — AB (ref 65–99)
GLUCOSE-CAPILLARY: 150 mg/dL — AB (ref 65–99)
Glucose-Capillary: 97 mg/dL (ref 65–99)

## 2017-02-17 LAB — LACTIC ACID, PLASMA: Lactic Acid, Venous: 0.8 mmol/L (ref 0.5–1.9)

## 2017-02-17 LAB — PHOSPHORUS: Phosphorus: 3.7 mg/dL (ref 2.5–4.6)

## 2017-02-17 LAB — CK: CK TOTAL: 341 U/L — AB (ref 38–234)

## 2017-02-17 LAB — GAMMA GT: GGT: 162 U/L — AB (ref 7–50)

## 2017-02-17 LAB — MRSA PCR SCREENING: MRSA by PCR: NEGATIVE

## 2017-02-17 MED ORDER — METOPROLOL TARTRATE 12.5 MG HALF TABLET
12.5000 mg | ORAL_TABLET | Freq: Two times a day (BID) | ORAL | Status: DC
  Administered 2017-02-17 – 2017-02-22 (×11): 12.5 mg via ORAL
  Filled 2017-02-17 (×11): qty 1

## 2017-02-17 MED ORDER — WARFARIN SODIUM 1 MG PO TABS: 1.0000 mg | ORAL_TABLET | Freq: Once | ORAL | Status: AC

## 2017-02-17 MED ORDER — WARFARIN - PHARMACIST DOSING INPATIENT
Freq: Every day | Status: DC
  Administered 2017-02-21: 18:00:00

## 2017-02-17 NOTE — Progress Notes (Signed)
ANTICOAGULATION CONSULT NOTE  Pharmacy Consult for Coumadin Indication: Atrial fibrillation   Assessment: 83 yo F presents on 5/29 with SOB. Hx of ESRD, thrombocytopenia and Afib on Coumadin 5mg  daily PTA. Pharmacy consulted to dose warfarin. Anticoagulation clinic visit notes show labile INRs with multiple dose changes. Most recent clinic visit on 5/21 shows INR of 1.4. Patient unable to report last dose of warfarin due to lethargy and requirement of BiPAP.   INR supratherapeutic today at 3.57 (repeat 3.39). Of note, patient with elevated LFTs on admission, but are trending down. Hgb low-stable at 8.5, platelets slightly low at 127. No obvious signs or symptoms of bleeding.   Goal of Therapy:  INR 2-3 Monitor platelets by anticoagulation protocol: Yes   Plan:  Warfarin 1 mg PO x1 tonight  Monitor daily INR, CBC as needed Monitor for s/s of bleeding    Angela Trujillo, PharmD Candidate   I agree with Angela Trujillo's assessment and plan.    Argie Ramming, PharmD Pharmacy Resident  Pager [phone] 02/17/17 12:30 PM

## 2017-02-17 NOTE — Progress Notes (Signed)
Physical Therapy Treatment Patient Details Name: Angela Trujillo MRN: [id number] DOB: 1931-01-22 Today's Date: 02/17/2017    History of Present Illness Patient is an 83 yo female admitted 02/16/17 with acute hypoxic respiratory distress. PMH is significant for ESRD on HD, DM, thrombocytopenia, severe AS S/P TAVR, HTN, HFpEF, PAF, HLD     PT Comments    Patient very fatigued following bath in chair, and multiple transfers/standing.  Continues to have decreased activity tolerance for mobility.   Follow Up Recommendations  SNF;Supervision/Assistance - 24 hour     Equipment Recommendations  None recommended by PT    Recommendations for Other Services       Precautions / Restrictions Precautions Precautions: Fall Restrictions Weight Bearing Restrictions: No    Mobility  Bed Mobility Overal bed mobility: Needs Assistance Bed Mobility: Sit to Supine     Supine to sit: Min assist Sit to supine: Min assist   General bed mobility comments: Min assist to bring LE's onto bed.  Patient able to use bedrail and control trunk descent.  Transfers Overall transfer level: Needs assistance Equipment used: 1 person hand held assist Transfers: Sit to/from [facility] Sit to Stand: Min assist Stand pivot transfers: Min assist;+2 safety/equipment       General transfer comment: Patient able to scoot to edge of chair.  Assist to rise to standing and for balance.  Patient stood x 60 seconds for pericare following bm.  Patient reports she needs the [facility].  Patient able to pivot transfer chair > BSC > bed with min assist and +1 for safety.  Ambulation/Gait             General Gait Details: NT   Stairs            Wheelchair Mobility    Modified Rankin (Stroke Patients Only)       Balance Overall balance assessment: Needs assistance Sitting-balance support: Single extremity supported;Feet supported Sitting balance-Leahy Scale: Poor Sitting balance -  Comments: Assist to maintain balance during ADL tasks.    Standing balance support: Single extremity supported Standing balance-Leahy Scale: Poor Standing balance comment: Required UE support and external assist for standing balance.                            Cognition Arousal/Alertness: Awake/alert Behavior During Therapy: Flat affect Overall Cognitive Status: Within Functional Limits for tasks assessed                                        Exercises      General Comments        Pertinent Vitals/Pain Pain Assessment: No/denies pain    Home Living Family/patient expects to be discharged to:: Private residence Living Arrangements: Alone Available Help at Discharge: Family;Available PRN/intermittently (overnight) Type of Home: House Home Access: Stairs to enter   Home Layout: One level Home Equipment: Bedside commode;Walker - 4 wheels;Walker - 2 wheels;Shower seat      Prior Function Level of Independence: Independent with assistive device(s)      Comments: Patient reports no use of cane or RW for gait.   PT Goals (current goals can now be found in the care plan section) Acute Rehab PT Goals Patient Stated Goal: None stated PT Goal Formulation: With patient Time For Goal Achievement: 02/24/17 Potential to Achieve Goals: Good Progress towards PT goals: Progressing toward  goals    Frequency    Min 2X/week      PT Plan Current plan remains appropriate    Co-evaluation PT/OT/SLP Co-Evaluation/Treatment: Yes Reason for Co-Treatment: For patient/therapist safety PT goals addressed during session: Mobility/safety with mobility OT goals addressed during session: ADL's and self-care      AM-PAC PT "6 Clicks" Daily Activity  Outcome Measure  Difficulty turning over in bed (including adjusting bedclothes, sheets and blankets)?: A Little Difficulty moving from lying on back to sitting on the side of the bed? : Total Difficulty  sitting down on and standing up from a chair with arms (e.g., wheelchair, bedside commode, etc,.)?: Total Help needed moving to and from a bed to chair (including a wheelchair)?: A Little Help needed walking in hospital room?: A Lot Help needed climbing 3-5 steps with a railing? : A Lot 6 Click Score: 12    End of Session Equipment Utilized During Treatment: Oxygen Activity Tolerance: Patient limited by fatigue Patient left: in bed;with call bell/phone within reach;with nursing/sitter in room Nurse Communication: Mobility status PT Visit Diagnosis: Unsteadiness on feet (R26.81);Other abnormalities of gait and mobility (R26.89);Muscle weakness (generalized) (M62.81)     Time: 8264-1583 PT Time Calculation (min) (ACUTE ONLY): 9 min  Charges:  $Therapeutic Activity: 8-22 mins                    G Codes:       Carita Pian. Rosana Hoes PT, [facility] Acute Rehab Services Pager [place] 02/17/2017, 6:02 PM

## 2017-02-17 NOTE — Discharge Summary (Signed)
[facility] Discharge Summary  Patient name: Angela Trujillo Medical record number: [id number] Date of birth: 07-15-1931 Age: 83 y.o. Gender: female Date of Admission: 02/16/2017  Date of Discharge: 02/22/17 Admitting Physician: Lupita Dawn, MD  Primary Care Provider: Glendon Axe, MD Consultants: nephrology, CCM  Indication for Hospitalization: acute hypoxic respiratory distress    Discharge Diagnoses/Problem List:  Hospital acquired PNA  Transaminitis ESRD T2DM HTN HFpEF PAF HLD  Disposition: Home with HHPT  Discharge Condition: Stable, improved  Discharge Exam:  General: Thin elderly female resting in bed, NAD, A&O Cardiovascular: RRR, no murmur Respiratory: CTAB, normal WOB Abdomen: flat, Soft, nondistended, nontender, +BS  Extremities: no edema, warm Psych: mood stable, speech normal  Brief Hospital Course:  Angela Trujillo a 83 y.o.femalewith PMH significant for ESRD, T2DM, thrombocytopenia, S/P TAVR, HTN, HFpEF (G2DD), PAF who presented in respiratory distress.   Acute Hypoxic Respiratory Distress  Multifactorial from fluid overload 2/2 missed HD and hospital acquired PNA. Patient presented after feeling unwell with a cough for several days. She missed HD on the day of admission due to feeling unwell. In ED, fever to 101.2, white count elevated to 11.7 and elevated LA to 10.0 concerning for sepsis also with tachypneic to the 40s and desats to 80s. Placed on BiPAP and started on vancomycin and aztreonam for HAP. HD emergently conducted due to thought that fluid overload was contributing to respiratory distress. CCM was consulted due to severity of presentation - agreed with HD, albuterol PRN, trending lactic acid. Lactic acid trended down to normal after HD and antibiotics. Procalcitonin was elevated to 12.15, likely some over elevation due to ESRD. Patient clinically improved and was transitioned to po levaquin to complete a 7 day course  of HAP. On day of discharge, she was back to her respiratory baseline and doing well.  Issues for Follow Up:  1. Monitor BP, patient discharged on Toprol XL 100 mg daily, Clonidine 0.1 mg BID. Home hydralazine 50 mg TID held d/t normotensive blood pressures.  Significant Procedures: none  Significant Labs and Imaging:   Recent Labs Lab 02/18/17 0725 02/19/17 0502 02/20/17 0546  WBC 11.0* 8.5 8.9  HGB 7.9* 8.3* 8.7*  HCT 23.5* 26.0* 26.8*  PLT 120* 99* 92*    Recent Labs Lab 02/16/17 1153 02/17/17 0242 02/17/17 1112 02/18/17 0532 02/18/17 0700 02/18/17 0725 02/19/17 0502 02/20/17 0546 02/21/17 0459  NA 134* 135 135 132*  --  132* 132* 126* 131*  K 6.1* 3.9 4.1 3.9  --  4.0 3.8 4.1 3.4*  CL 94* 97* 97* 97*  --  96* 95* 92* 93*  CO2 12* 26 26 24   --  25 29 23 27   GLUCOSE 221* 119* 96 116*  --  99 139* 164* 188*  BUN 46* 19 25* 34*  --  33* 14 29* 17  CREATININE 10.64* 5.45* 6.35* 7.43*  --  7.73* 4.95* 7.28* 4.31*  CALCIUM 7.8* 7.7* 7.5* 6.6*  --  6.9* 7.1* 6.7* 7.2*  MG  --   --  2.1  --   --   --   --   --   --   PHOS  --   --  3.7  --   --  3.1  --   --  2.1*  ALKPHOS 184* 165* 167*  --  133*  --   --  131*  --   AST 288* 230* 205*  --  195*  --   --  105*  --  ALT 81* 107* 108*  --  114*  --   --  93*  --   ALBUMIN 3.2* 2.8* 2.7*  --  2.3* 2.3*  --  2.4* 2.1*     Results/Tests Pending at Time of Discharge: none  Discharge Medications:  Allergies as of 02/22/2017      Reactions   Asa [aspirin] Nausea Only, Other (See Comments)   MAKES STOMACH HURT   Codeine Rash   Penicillins Rash   Has patient had a PCN reaction causing immediate rash, facial/tongue/throat swelling, SOB or lightheadedness with hypotension:  #  #  #  NO  #  #  #  Has patient had a PCN reaction causing severe rash involving mucus membranes or skin necrosis:  #  #  #  NO  #  #  # Has patient had a PCN reaction that required hospitalization:  #  #  #  NO  #  #  #  Has patient had a PCN reaction  occurring within the last 10 years:  #  #  #  NO  #  #  #  If all answers are "NO", may proceed with Cephalosporin      Medication List    STOP taking these medications   chlorhexidine 0.12 % solution Commonly known as:  PERIDEX   hydrALAZINE 50 MG tablet Commonly known as:  APRESOLINE     TAKE these medications   amiodarone 200 MG tablet Commonly known as:  PACERONE Take 1 tablet (200 mg total) by mouth daily.   aspirin 81 MG EC tablet Take 1 tablet (81 mg total) by mouth daily.   calcium acetate 667 MG capsule Commonly known as:  PHOSLO Take 667 mg by mouth daily with breakfast.   cloNIDine 0.1 MG tablet Commonly known as:  CATAPRES Take 1 tablet (0.1 mg total) by mouth 2 (two) times daily.   famotidine 20 MG tablet Commonly known as:  PEPCID Take 1 tablet (20 mg total) by mouth 2 (two) times daily.   ferrous sulfate 325 (65 FE) MG tablet Take 325 mg by mouth 3 (three) times daily with meals.   metoprolol succinate 100 MG 24 hr tablet Commonly known as:  TOPROL-XL Take 100 mg by mouth daily.   multivitamin Tabs tablet Take 1 tablet by mouth at bedtime.   SENSIPAR 30 MG tablet Generic drug:  cinacalcet Take 30 mg by mouth Every Tuesday,Thursday,and Saturday with dialysis.   TOUJEO SOLOSTAR [place] Inject 10 Units into the skin at bedtime.   warfarin 5 MG tablet Commonly known as:  COUMADIN TAKE AS DIRECTED BY THE COUMADIN CLINIC( 1 MONTH SUPPLY) What changed:  how much to take  how to take this  when to take this  additional instructions       Discharge Instructions: Please refer to Patient Instructions section of EMR for full details.  Patient was counseled important signs and symptoms that should prompt return to medical care, changes in medications, dietary instructions, activity restrictions, and follow up appointments.   Follow-Up Appointments: Follow-up Information    Glendon Axe, MD. Schedule an appointment as soon as possible for a visit in  1 week(s).   Specialty:  Family Medicine Why:  Make a hospital follow up appointment to be seen by your primary care provider within 1 week of leaving the hospital. Contact information: [address] [place] [address] [place], [place], DO 02/22/2017, 9:51  PM PGY-1, [place]

## 2017-02-17 NOTE — Progress Notes (Signed)
Family Medicine Teaching Service Daily Progress Note Intern Pager: [phone]  Patient name: Angela Trujillo Medical record number: [id number] Date of birth: March 15, 1931 Age: 83 y.o. Gender: female  Primary Care Provider: Glendon Axe, MD Consultants: CCM, nephrology Code Status: FULL   Pt Overview and Major Events to Date:  5/29 admitted with respiratory distress, likely HAP  Assessment and Plan: Angela Trujillo is a 83 y.o. female presenting with respiratory distress. PMH is significant for ESRD, T2DM, thrombocytopenia, S/P TAVR,  HTN, HFpEF (G2DD), PAF   Acute Hypoxic Respiratory Distress likely 2/2 infection:  Patient presented tachypneic with fever to 101.2, white count elevated to 11.7 and elevated LA to 10.0 concerning for sepsis.  Is an ESRD patient on HD and missed today's session due to feeling ill. With worsening tachypnea, wheezing and shortness of breath in setting of elevated white count and LA, this appears more likely to be infectious. Started on Aztreonam and Vancomycin in ED.  Initially hypoxic and was placed on Bipap with some improvement in her respiratory distress.  Procalcitonin elevated. Lactic acid 10>2.5>0.8.  -Nephrology following, appreciate recs: likely volume overload as well as HAP  -CCM consulted, appreciate recs: continue HD, continue BiPAP, albuterol PRN -Blood cultures and urine cultures pending -trend Lactic acid  -Albuterol neb Q4H PRN -Continue vancomycin and Aztreonam  -attempt to wean BiPAP today  Transaminatis: AST 288, ALT 81 > AST 230 ALT 107. Likely source due to infection vs medication - Amiodarone can cause transaminitis. INR 3.57 (on warfarin for PAF). Hep B surface antigen negative.  -consider RUQ [place] and Hep panel   ESRD: T/Th/Sat; concern for electrolyte disturbance and fluid overload. Family notes she missed dialysis today due to not feeling well.   - Nephrology consulted in ED, appreciate recs -Continue daily Phoslo -Cinacalcet with  HD -Daily BMET   T2DM: currently at home on Toujeo 10 units.  Last A1c 09/2016 was 8.5%.    - Currently NPO  - SSI and ACQHS CBG   HTN. On admission BP 177/86.  At home on Toprol XL 100 mg daily and Clonidine 0.1 mg BID.  -Continue Metoprolol and Clonidine  - Held hydralazine 50 mg TID   HFpEF: 10/2016 with an EF of 28-31%, grade 2 diastolic dysfunction, s/pt TAVR with bioprosthetic prosthesis, mild MV regurgitation, PA 50 mmHg. - change toprol XL 100mg  to metoprolol tartrate 12.5mg  BID   PAF: Home meds include amiodarone, metoprolol, Coumadin. INR goal 2-3. INR 3.57.  - Continue home amiodarone, Coumadin  HLD.  At home on Lipitor 20 mg daily.  -Continue home med  FEN/GI:  renal diet if able to wean from BiPAP Prophylaxis: Heparin    Disposition: continued management of HAP  Subjective:  Patient sleepy but easily arousable, reports that she feels better than last night. Comfortable on BiPAP.   Objective: Temp:  [97.6 F (36.4 C)-101.2 F (38.4 C)] 98.7 F (37.1 C) (05/30 0400) Pulse Rate:  [62-82] 62 (05/30 0400) Resp:  [16-32] 17 (05/30 0400) BP: (109-189)/(49-102) 122/49 (05/30 0400) SpO2:  [97 %-100 %] 99 % (05/30 0400) FiO2 (%):  [30 %] 30 % (05/30 0400) Weight:  [147 lb 4.3 oz (66.8 kg)] 147 lb 4.3 oz (66.8 kg) (05/29 2300) Physical Exam: General: Thin elderly female resting in bed in NAD with BiPAP on.  Cardiovascular: RRR, no murmur Respiratory: CTAB, easy WOB on BiPAP Abdomen: SNTND, +BS  Extremities: no edema, warm  Laboratory:  Recent Labs Lab 02/16/17 1153 02/17/17 0242  WBC 11.7* 11.8*  HGB 9.6*  8.5*  HCT 31.1* 26.2*  PLT 148* 127*    Recent Labs Lab 02/16/17 1153 02/17/17 0242  NA 134* 135  K 6.1* 3.9  CL 94* 97*  CO2 12* 26  BUN 46* 19  CREATININE 10.64* 5.45*  CALCIUM 7.8* 7.7*  PROT 7.0 6.3*  BILITOT 1.5* 1.0  ALKPHOS 184* 165*  ALT 81* 107*  AST 288* 230*  GLUCOSE 221* 119*    INR 3.57 Lactic acid 10 > 2.5 > 0.8    Imaging/Diagnostic Tests: Dg Chest 2 View  Result Date: 02/16/2017 CLINICAL DATA:  Increased shortness of breath. EXAM: CHEST  2 VIEW COMPARISON:  10/22/2016. FINDINGS: Prior aortic valve repair. Cardiomegaly. Pulmonary vascular prominence. No pulmonary interstitial prominence. Interim clearing of previously identified interstitial edema from chest x-ray of 10/22/2016. No pleural effusion or pneumothorax. Right axillary stents again noted. IMPRESSION: 1. Prior cardiac valve repair. Cardiomegaly with mild pulmonary vascular prominence. 2. No evidence of pulmonary edema. Interim clearing of previously identified pulmonary interstitial edema from chest x-ray of 10/22/2016. Electronically Signed   By: Marcello Moores  Register   On: 02/16/2017 13:54   Sela Hilding, MD 02/17/2017, 5:46 AM PGY-1, [place] Intern pager: [phone], text pages welcome

## 2017-02-17 NOTE — Care Management Note (Signed)
Case Management Note  Patient Details  Name: Angela Trujillo MRN: [id number] Date of Birth: 1931/06/24  Subjective/Objective:        Pt admitted with volume overload post missing HD session            Action/Plan:  PTA from home.  Pt is an ESRD pt on HD.  Pt is currently on BIPAP - no family at bedside.  CM will continue to follow for discharge needs   Expected Discharge Date:                  Expected Discharge Plan:     In-House Referral:     Discharge planning Services  CM Consult  Post Acute Care Choice:    Choice offered to:     DME Arranged:    DME Agency:     HH Arranged:    HH Agency:     Status of Service:     If discussed at [employer], dates discussed:    Additional Comments:  Maryclare Labrador, RN 02/17/2017, 9:52 AM

## 2017-02-17 NOTE — Progress Notes (Signed)
Patient ID: Angela Trujillo, female   DOB: 10/21/1930, 83 y.o.   MRN: [id number]  [facility] Progress Note   Assessment/ Plan:   1. Respiratory distress:  suspected to be bi-factorial from pulmonary edema/volume overload and pneumonia. Respiratory status appears to have improved somewhat with ultrafiltration with hemodialysis and she is being transitioned from NIPPV to oxygen via nasal cannula. No acute indications for dialysis today. 2. Hyperkalemia:  corrected with emergent hemodialysis 3. End-stage renal disease: Hemodialysis tomorrow to continue her usual outpatient Tuesday/Thursday/Saturday schedule. She does not have any acute indications for hemodialysis today based on labs/physical exam findings. 4. Hypertension:  blood pressures improved with ultrafiltration/volume unloading with hemodialysis-resume oral and hypertensive agents. 5. Anemia: Consistent with anemia of chronic kidney disease, paradoxically lower hemoglobin noted after ultrafiltration with hemodialysis-restarting ESA. 6. Metabolic bone disease:  restart phosphorus binders when able to take in orally-will restart VDRA with dialysis.  Subjective:   Somewhat somnolent when seen-arouses with conversation and drinking water. She appears to be oriented when sufficiently awake.    Objective:   BP (!) 136/49 (BP Location: Left Arm)   Pulse 61   Temp 98.2 F (36.8 C) (Oral)   Resp 17   Ht 5\' 5"  (1.651 m)   Wt 67.1 kg (147 lb 14.9 oz)   SpO2 99%   BMI 24.62 kg/m   Physical Exam: HDQ:QIWLNLGX somnolent-arouses with conversation CVS: Pulse regular rhythm, normal rate, S1 and S2 normal Resp: Intermittent expiratory rhonchi otherwise clear to auscultation Abd: Soft, flat, nontender Ext: No lower extremity edema, right brachiocephalic fistula with good thrill  Labs: BMET  Recent Labs Lab 02/16/17 1153 02/17/17 0242  NA 134* 135  K 6.1* 3.9  CL 94* 97*  CO2 12* 26  GLUCOSE 221* 119*  BUN 46* 19   CREATININE 10.64* 5.45*  CALCIUM 7.8* 7.7*   CBC  Recent Labs Lab 02/16/17 1153 02/17/17 0242  WBC 11.7* 11.8*  NEUTROABS 9.5*  --   HGB 9.6* 8.5*  HCT 31.1* 26.2*  MCV 96.9 90.7  PLT 148* 127*   Medications:    . amiodarone  200 mg Oral Daily  . atorvastatin  20 mg Oral QHS  . calcium acetate  667 mg Oral Q breakfast  . [START ON 02/18/2017] cinacalcet  30 mg Oral Q T,Th,Sa-HD  . cloNIDine  0.1 mg Oral BID  . insulin aspart  0-9 Units Subcutaneous TID WC  . metoprolol tartrate  12.5 mg Oral BID  . pneumococcal 23 valent vaccine  0.5 mL Intramuscular Tomorrow-1000  . sodium chloride flush  3 mL Intravenous Q12H   Elmarie Shiley, MD 02/17/2017, 10:34 AM

## 2017-02-17 NOTE — Evaluation (Signed)
Occupational Therapy Evaluation Patient Details Name: Angela Trujillo MRN: [id number] DOB: 1931/09/18 Today's Date: 02/17/2017    History of Present Illness Patient is an 83 yo female admitted 02/16/17 with acute hypoxic respiratory distress. PMH is significant for ESRD on HD, DM, thrombocytopenia, severe AS S/P TAVR, HTN, HFpEF, PAF, HLD    Clinical Impression   PTA, pt was independent with assistive devices for functional mobility and ADL. She currently requires min assist +2 for safety with toilet transfers, mod assist for LB ADL, and min assist for UB ADL. She presents with generalized weakness and decreased activity tolerance for ADL. Recommend short-term SNF placement for continued rehabilitation in preparation for D/C home in order to maximize independence with ADL and return to PLOF. OT will continue to follow acutely.     Follow Up Recommendations  SNF;Supervision/Assistance - 24 hour    Equipment Recommendations  Other (comment) (TBD at next venue of care)    Recommendations for Other Services       Precautions / Restrictions Precautions Precautions: Fall Restrictions Weight Bearing Restrictions: No      Mobility Bed Mobility Overal bed mobility: Needs Assistance Bed Mobility: Supine to Sit     Supine to sit: Min assist     General bed mobility comments: Verbal cues to move to EOB.  Patient able to move LE's to EOB.  Assist to bring trunk to upright position.  Assist to maintain balance while scooting hips to EOB in sitting.  Good sitting balance once upright.  Transfers Overall transfer level: Needs assistance Equipment used: 1 person hand held assist Transfers: Sit to/from [facility] Sit to Stand: Min assist Stand pivot transfers: Min assist;+2 safety/equipment       General transfer comment: Verbal cues for hand placement.  Assist to power up to standing and for balance.  Patient with flexed trunk, and posterior lean.  Patient able to take  several shuffle steps to pivot to chair with min assist for balance and +2 assist for safety/lines/chair.    Balance Overall balance assessment: Needs assistance Sitting-balance support: Single extremity supported;Feet supported Sitting balance-Leahy Scale: Poor Sitting balance - Comments: Assist to maintain balance during ADL tasks.    Standing balance support: Single extremity supported Standing balance-Leahy Scale: Poor Standing balance comment: Required UE support and external assist for standing balance.                           ADL either performed or assessed with clinical judgement   ADL Overall ADL's : Needs assistance/impaired Eating/Feeding: Set up;Sitting Eating/Feeding Details (indicate cue type and reason): Able to complete without physical assist. However, daughter feeding pt on arrival.  Grooming: Minimal assistance;Sitting   Upper Body Bathing: Minimal assistance;Sitting   Lower Body Bathing: Moderate assistance;Sit to/from stand   Upper Body Dressing : Minimal assistance;Sitting   Lower Body Dressing: Moderate assistance;Sit to/from stand   Toilet Transfer: Minimal assistance;Stand-pivot;+2 for safety/equipment   Toileting- Clothing Manipulation and Hygiene: Sit to/from stand;Moderate assistance       Functional mobility during ADLs: Minimal assistance (stand-pivot this session) General ADL Comments: Pt with significantly decreased strength and activity tolerance related to ADL.      Vision Patient Visual Report: No change from baseline Vision Assessment?: No apparent visual deficits     Perception     Praxis      Pertinent Vitals/Pain Pain Assessment: No/denies pain     Hand Dominance Right   Extremity/Trunk Assessment Upper  Extremity Assessment Upper Extremity Assessment: Generalized weakness   Lower Extremity Assessment Lower Extremity Assessment: Generalized weakness   Cervical / Trunk Assessment Cervical / Trunk  Assessment: Kyphotic   Communication Communication Communication: HOH   Cognition Arousal/Alertness: Awake/alert Behavior During Therapy: Flat affect Overall Cognitive Status: Within Functional Limits for tasks assessed                                     General Comments       Exercises     Shoulder Instructions      Home Living Family/patient expects to be discharged to:: Private residence Living Arrangements: Alone Available Help at Discharge: Family;Available PRN/intermittently (overnight) Type of Home: House Home Access: Stairs to enter [employer] of Steps: 1+1   Home Layout: One level     Bathroom Shower/Tub: Teacher, early years/pre: Standard Bathroom Accessibility: Yes   Home Equipment: Bedside commode;Walker - 4 wheels;Walker - 2 wheels;Shower seat          Prior Functioning/Environment Level of Independence: Independent with assistive device(s)        Comments: Patient reports no use of cane or RW for gait.        OT Problem List: Decreased strength;Decreased activity tolerance;Impaired balance (sitting and/or standing);Decreased safety awareness      OT Treatment/Interventions: Self-care/ADL training;Therapeutic exercise;Energy conservation;DME and/or AE instruction;Therapeutic activities;Patient/family education;Balance training    OT Goals(Current goals can be found in the care plan section) Acute Rehab OT Goals Patient Stated Goal: None stated OT Goal Formulation: With patient Time For Goal Achievement: 03/03/17 Potential to Achieve Goals: Good ADL Goals Pt Will Perform Grooming: with supervision;standing Pt Will Perform Upper Body Dressing: with supervision;sitting Pt Will Perform Lower Body Dressing: with min guard assist;sit to/from stand Pt Will Transfer to Toilet: ambulating;with supervision;bedside commode (BSC over toilet) Pt Will Perform Toileting - Clothing Manipulation and hygiene: with  supervision;sit to/from stand Pt Will Perform Tub/Shower Transfer: with min assist;Tub transfer;ambulating;shower seat;3 in 1;rolling walker Pt/caregiver will Perform Home Exercise Program: Increased strength;Both right and left upper extremity;With written HEP provided;With Supervision  OT Frequency: Min 2X/week   Barriers to D/C:            Co-evaluation PT/OT/SLP Co-Evaluation/Treatment: Yes Reason for Co-Treatment: For patient/therapist safety PT goals addressed during session: Mobility/safety with mobility OT goals addressed during session: ADL's and self-care      AM-PAC PT "6 Clicks" Daily Activity     Outcome Measure Help from another person eating meals?: A Little Help from another person taking care of personal grooming?: A Little Help from another person toileting, which includes using toliet, bedpan, or urinal?: A Lot Help from another person bathing (including washing, rinsing, drying)?: A Lot Help from another person to put on and taking off regular upper body clothing?: A Little Help from another person to put on and taking off regular lower body clothing?: A Lot 6 Click Score: 15   End of Session Equipment Utilized During Treatment: Oxygen Nurse Communication: Mobility status  Activity Tolerance: Patient tolerated treatment well Patient left: in chair;with call bell/phone within reach;with nursing/sitter in room  OT Visit Diagnosis: Muscle weakness (generalized) (M62.81);Unsteadiness on feet (R26.81)                Time: 3007-6226 OT Time Calculation (min): 18 min Charges:  OT General Charges $OT Visit: 1 Procedure OT Evaluation $OT Eval Moderate Complexity: 1 Procedure  G-Codes:     Norman Herrlich, MS OTR/L  Pager: [place] A Samantha Ragen 02/17/2017, 5:22 PM

## 2017-02-17 NOTE — Evaluation (Signed)
Physical Therapy Evaluation Patient Details Name: Angela Trujillo MRN: [id number] DOB: 09-30-1930 Today's Date: 02/17/2017   History of Present Illness  Patient is an 83 yo female admitted 02/16/17 with acute hypoxic respiratory distress. PMH is significant for ESRD on HD, DM, thrombocytopenia, severe AS S/P TAVR, HTN, HFpEF, PAF, HLD   Clinical Impression  Patient presents with problems listed below.  Will benefit from acute PT to maximize functional mobility prior to discharge. Patient was independent pta.  Today, patient with general weakness and decreased balance impacting mobility/gait/safety.  Recommend ST-SNF at d/c for continued therapy with goal to return home.    Follow Up Recommendations SNF;Supervision/Assistance - 24 hour    Equipment Recommendations  None recommended by PT    Recommendations for Other Services       Precautions / Restrictions Precautions Precautions: Fall Restrictions Weight Bearing Restrictions: No      Mobility  Bed Mobility Overal bed mobility: Needs Assistance Bed Mobility: Supine to Sit     Supine to sit: Min assist     General bed mobility comments: Verbal cues to move to EOB.  Patient able to move LE's to EOB.  Assist to bring trunk to upright position.  Assist to maintain balance while scooting hips to EOB in sitting.  Good sitting balance once upright.  Transfers Overall transfer level: Needs assistance Equipment used: 1 person hand held assist Transfers: Sit to/from [facility] Sit to Stand: Min assist Stand pivot transfers: Min assist;+2 safety/equipment       General transfer comment: Verbal cues for hand placement.  Assist to power up to standing and for balance.  Patient with flexed trunk, and posterior lean.  Patient able to take several shuffle steps to pivot to chair with min assist for balance and +2 assist for safety/lines/chair.  Ambulation/Gait                Stairs            Wheelchair  Mobility    Modified Rankin (Stroke Patients Only)       Balance Overall balance assessment: Needs assistance Sitting-balance support: Single extremity supported;Feet supported Sitting balance-Leahy Scale: Poor Sitting balance - Comments: Assist to maintain balance.  Trunk weakness   Standing balance support: Single extremity supported Standing balance-Leahy Scale: Poor Standing balance comment: Required UE support and external assist for standing balance.                             Pertinent Vitals/Pain Pain Assessment: No/denies pain    Home Living Family/patient expects to be discharged to:: Private residence Living Arrangements: Alone Available Help at Discharge: Family;Available PRN/intermittently (overnight) Type of Home: House Home Access: Stairs to enter   Entrance Stairs-Number of Steps: 1+1 Home Layout: One level Home Equipment: Bedside commode;Walker - 4 wheels;Walker - 2 wheels;Shower seat      Prior Function Level of Independence: Independent         Comments: Patient reports no use of cane or RW for gait.     Hand Dominance   Dominant Hand: Right    Extremity/Trunk Assessment   Upper Extremity Assessment Upper Extremity Assessment: Defer to OT evaluation    Lower Extremity Assessment Lower Extremity Assessment: Generalized weakness    Cervical / Trunk Assessment Cervical / Trunk Assessment: Kyphotic  Communication   Communication: HOH  Cognition Arousal/Alertness: Awake/alert Behavior During Therapy: Flat affect Overall Cognitive Status: Within Functional Limits for tasks assessed  General Comments      Exercises     Assessment/Plan    PT Assessment Patient needs continued PT services  PT Problem List Decreased strength;Decreased activity tolerance;Decreased balance;Decreased mobility;Decreased knowledge of use of DME;Cardiopulmonary status limiting activity        PT Treatment Interventions DME instruction;Gait training;Functional mobility training;Therapeutic activities;Therapeutic exercise;Patient/family education    PT Goals (Current goals can be found in the Care Plan section)  Acute Rehab PT Goals Patient Stated Goal: None stated PT Goal Formulation: With patient Time For Goal Achievement: 02/24/17 Potential to Achieve Goals: Good    Frequency Min 2X/week   Barriers to discharge Decreased caregiver support Alone during daytime    Co-evaluation PT/OT/SLP Co-Evaluation/Treatment: Yes Reason for Co-Treatment: For patient/therapist safety PT goals addressed during session: Mobility/safety with mobility         AM-PAC PT "6 Clicks" Daily Activity  Outcome Measure Difficulty turning over in bed (including adjusting bedclothes, sheets and blankets)?: A Little Difficulty moving from lying on back to sitting on the side of the bed? : Total Difficulty sitting down on and standing up from a chair with arms (e.g., wheelchair, bedside commode, etc,.)?: Total Help needed moving to and from a bed to chair (including a wheelchair)?: A Little Help needed walking in hospital room?: A Lot Help needed climbing 3-5 steps with a railing? : A Lot 6 Click Score: 12    End of Session Equipment Utilized During Treatment: Oxygen Activity Tolerance: Patient limited by fatigue Patient left: in chair;with call bell/phone within reach;with nursing/sitter in room (for bath) Nurse Communication: Mobility status PT Visit Diagnosis: Unsteadiness on feet (R26.81);Other abnormalities of gait and mobility (R26.89);Muscle weakness (generalized) (M62.81)    Time: 1937-9024 PT Time Calculation (min) (ACUTE ONLY): 18 min   Charges:   PT Evaluation $PT Eval Moderate Complexity: 1 Procedure     PT G Codes:        Carita Pian. Sanjuana Kava, [facility] [place] Acute Rehab Services Pager [place] 02/17/2017, 3:24 PM

## 2017-02-18 ENCOUNTER — Inpatient Hospital Stay (HOSPITAL_COMMUNITY): Payer: Medicare Other

## 2017-02-18 ENCOUNTER — Telehealth: Payer: Self-pay | Admitting: Internal Medicine

## 2017-02-18 DIAGNOSIS — Z992 Dependence on renal dialysis: Secondary | ICD-10-CM

## 2017-02-18 DIAGNOSIS — N186 End stage renal disease: Secondary | ICD-10-CM

## 2017-02-18 LAB — RENAL FUNCTION PANEL
ALBUMIN: 2.3 g/dL — AB (ref 3.5–5.0)
Anion gap: 11 (ref 5–15)
BUN: 33 mg/dL — AB (ref 6–20)
CO2: 25 mmol/L (ref 22–32)
CREATININE: 7.73 mg/dL — AB (ref 0.44–1.00)
Calcium: 6.9 mg/dL — ABNORMAL LOW (ref 8.9–10.3)
Chloride: 96 mmol/L — ABNORMAL LOW (ref 101–111)
GFR calc Af Amer: 5 mL/min — ABNORMAL LOW (ref >60)
GFR, EST NON AFRICAN AMERICAN: 4 mL/min — AB (ref >60)
Glucose, Bld: 99 mg/dL (ref 65–99)
Phosphorus: 3.1 mg/dL (ref 2.5–4.6)
Potassium: 4 mmol/L (ref 3.5–5.1)
Sodium: 132 mmol/L — ABNORMAL LOW (ref 135–145)

## 2017-02-18 LAB — CBC
HCT: 24.4 % — ABNORMAL LOW (ref 36.0–46.0)
HCT: 23.5 % — ABNORMAL LOW (ref 36.0–46.0)
Hemoglobin: 8.1 g/dL — ABNORMAL LOW (ref 12.0–15.0)
Hemoglobin: 7.9 g/dL — ABNORMAL LOW (ref 12.0–15.0)
MCH: 30 pg (ref 26.0–34.0)
MCH: 29.8 pg (ref 26.0–34.0)
MCHC: 33.2 g/dL (ref 30.0–36.0)
MCHC: 33.6 g/dL (ref 30.0–36.0)
MCV: 90.4 fL (ref 78.0–100.0)
MCV: 88.7 fL (ref 78.0–100.0)
PLATELETS: 125 10*3/uL — AB (ref 150–400)
PLATELETS: 120 10*3/uL — AB (ref 150–400)
RBC: 2.7 MIL/uL — ABNORMAL LOW (ref 3.87–5.11)
RBC: 2.65 MIL/uL — ABNORMAL LOW (ref 3.87–5.11)
RDW: 18 % — AB (ref 11.5–15.5)
RDW: 17.4 % — AB (ref 11.5–15.5)
WBC: 10.6 10*3/uL — ABNORMAL HIGH (ref 4.0–10.5)
WBC: 11 10*3/uL — AB (ref 4.0–10.5)

## 2017-02-18 LAB — PROTIME-INR
INR: 2.93
Prothrombin Time: 31.2 seconds — ABNORMAL HIGH (ref 11.4–15.2)

## 2017-02-18 LAB — BASIC METABOLIC PANEL
Anion gap: 11 (ref 5–15)
BUN: 34 mg/dL — AB (ref 6–20)
CALCIUM: 6.6 mg/dL — AB (ref 8.9–10.3)
CO2: 24 mmol/L (ref 22–32)
Chloride: 97 mmol/L — ABNORMAL LOW (ref 101–111)
Creatinine, Ser: 7.43 mg/dL — ABNORMAL HIGH (ref 0.44–1.00)
GFR calc Af Amer: 5 mL/min — ABNORMAL LOW (ref >60)
GFR, EST NON AFRICAN AMERICAN: 4 mL/min — AB (ref >60)
GLUCOSE: 116 mg/dL — AB (ref 65–99)
Potassium: 3.9 mmol/L (ref 3.5–5.1)
Sodium: 132 mmol/L — ABNORMAL LOW (ref 135–145)

## 2017-02-18 LAB — GLUCOSE, CAPILLARY
GLUCOSE-CAPILLARY: 167 mg/dL — AB (ref 65–99)
Glucose-Capillary: 83 mg/dL (ref 65–99)
Glucose-Capillary: 144 mg/dL — ABNORMAL HIGH (ref 65–99)

## 2017-02-18 LAB — HEPATIC FUNCTION PANEL
ALT: 114 U/L — ABNORMAL HIGH (ref 14–54)
AST: 195 U/L — ABNORMAL HIGH (ref 15–41)
Albumin: 2.3 g/dL — ABNORMAL LOW (ref 3.5–5.0)
Alkaline Phosphatase: 133 U/L — ABNORMAL HIGH (ref 38–126)
BILIRUBIN DIRECT: 0.2 mg/dL (ref 0.1–0.5)
BILIRUBIN INDIRECT: 0.6 mg/dL (ref 0.3–0.9)
Total Bilirubin: 0.8 mg/dL (ref 0.3–1.2)
Total Protein: 5.5 g/dL — ABNORMAL LOW (ref 6.5–8.1)

## 2017-02-18 LAB — PROCALCITONIN: PROCALCITONIN: 12.15 ng/mL

## 2017-02-18 MED ORDER — ACETAMINOPHEN 325 MG PO TABS
ORAL_TABLET | ORAL | Status: AC
  Filled 2017-02-18: qty 2

## 2017-02-18 MED ORDER — ONDANSETRON HCL 4 MG/2ML IJ SOLN
INTRAMUSCULAR | Status: AC
  Filled 2017-02-18: qty 2

## 2017-02-18 MED ORDER — ONDANSETRON 4 MG PO TBDP
4.0000 mg | ORAL_TABLET | Freq: Once | ORAL | Status: AC
  Administered 2017-02-18: 4 mg via ORAL
  Filled 2017-02-18: qty 1

## 2017-02-18 MED ORDER — HEPARIN SODIUM (PORCINE) 1000 UNIT/ML DIALYSIS: 40.0000 [IU]/kg | INTRAMUSCULAR | Status: DC | PRN

## 2017-02-18 MED ORDER — WARFARIN SODIUM 2 MG PO TABS
2.0000 mg | ORAL_TABLET | Freq: Once | ORAL | Status: AC
  Administered 2017-02-18: 2 mg via ORAL
  Filled 2017-02-18: qty 1

## 2017-02-18 MED ORDER — DARBEPOETIN ALFA 60 MCG/0.3ML IJ SOSY
PREFILLED_SYRINGE | INTRAMUSCULAR | Status: AC
  Filled 2017-02-18: qty 0.3

## 2017-02-18 MED ORDER — VANCOMYCIN HCL IN DEXTROSE 750-5 MG/150ML-% IV SOLN
INTRAVENOUS | Status: AC
  Filled 2017-02-18: qty 150

## 2017-02-18 MED ORDER — ALBUTEROL SULFATE (2.5 MG/3ML) 0.083% IN NEBU
2.5000 mg | INHALATION_SOLUTION | Freq: Four times a day (QID) | RESPIRATORY_TRACT | Status: DC
  Administered 2017-02-19: 2.5 mg via RESPIRATORY_TRACT
  Filled 2017-02-18 (×3): qty 3

## 2017-02-18 MED ORDER — DARBEPOETIN ALFA 60 MCG/0.3ML IJ SOSY
60.0000 ug | PREFILLED_SYRINGE | INTRAMUSCULAR | Status: DC
  Administered 2017-02-18: 60 ug via INTRAVENOUS

## 2017-02-18 NOTE — Progress Notes (Signed)
Patient ID: Angela Trujillo, female   DOB: 03-29-1931, 83 y.o.   MRN: [id number]  [facility] Progress Note   Assessment/ Plan:   1. Respiratory distress:  suspected to be bi-factorial from pulmonary edema/volume overload and pneumonia. Respiratory status improved with ongoing management. 2. End-stage renal disease: Hemodialysis today per usual outpatient Tuesday/Thursday/Saturday schedule. Relative hypotension indicates that she is close to her EDW. 3. Hypertension:  blood pressures lower with UF on HD-- monitor for additional changes. 4. Anemia: Consistent with anemia of chronic kidney disease, ordered aranesp. 5. Metabolic bone disease:  on VDRA for PTH suppression and start binder.  Subjective:   Complains of nausea on hemodialysis improved shortness of breath.   Objective:   BP (!) 95/40   Pulse 65   Temp 98 F (36.7 C) (Oral)   Resp 15   Ht 5\' 5"  (1.651 m)   Wt 67.6 kg (149 lb 0.5 oz)   SpO2 100%   BMI 24.80 kg/m   Physical Exam: Gen: Appears uncomfortable on dialysis CVS: Pulse regular rhythm, normal rate, S1 and S2 normal Resp: Intermittent expiratory rhonchi otherwise clear to auscultation Abd: Soft, flat, nontender Ext: No lower extremity edema, right brachiocephalic fistula with good thrill  Labs: BMET  Recent Labs Lab 02/16/17 1153 02/17/17 0242 02/17/17 1112 02/18/17 0532 02/18/17 0725  NA 134* 135 135 132* 132*  K 6.1* 3.9 4.1 3.9 4.0  CL 94* 97* 97* 97* 96*  CO2 12* 26 26 24 25   GLUCOSE 221* 119* 96 116* 99  BUN 46* 19 25* 34* 33*  CREATININE 10.64* 5.45* 6.35* 7.43* 7.73*  CALCIUM 7.8* 7.7* 7.5* 6.6* 6.9*  PHOS  --   --  3.7  --  3.1   CBC  Recent Labs Lab 02/16/17 1153 02/17/17 0242 02/18/17 0532 02/18/17 0725  WBC 11.7* 11.8* 10.6* 11.0*  NEUTROABS 9.5*  --   --   --   HGB 9.6* 8.5* 8.1* 7.9*  HCT 31.1* 26.2* 24.4* 23.5*  MCV 96.9 90.7 90.4 88.7  PLT 148* 127* 125* 120*   Medications:    . amiodarone  200 mg Oral  Daily  . calcium acetate  667 mg Oral Q breakfast  . cinacalcet  30 mg Oral Q T,Th,Sa-HD  . cloNIDine  0.1 mg Oral BID  . insulin aspart  0-9 Units Subcutaneous TID WC  . metoprolol tartrate  12.5 mg Oral BID  . ondansetron  4 mg Oral Once  . pneumococcal 23 valent vaccine  0.5 mL Intramuscular Tomorrow-1000  . sodium chloride flush  3 mL Intravenous Q12H  . warfarin  1 mg Oral ONCE-1800  . Warfarin - Pharmacist Dosing Inpatient   Does not apply [id number]   Elmarie Shiley, MD 02/18/2017, 10:14 AM

## 2017-02-18 NOTE — Progress Notes (Signed)
Family Medicine Teaching Service Daily Progress Note Intern Pager: [phone]  Patient name: Angela Trujillo Medical record number: [id number] Date of birth: December 27, 1930 Age: 83 y.o. Gender: female  Primary Care Provider: Glendon Axe, MD Consultants: CCM, nephrology Code Status: FULL   Pt Overview and Major Events to Date:  5/29 admitted with respiratory distress, likely HAP  Assessment and Plan: Angela Trujillo is a 83 y.o. female presenting with respiratory distress. PMH is significant for ESRD, T2DM, thrombocytopenia, S/P TAVR,  HTN, HFpEF (G2DD), PAF   Acute Hypoxic Respiratory Distress likely 2/2 infection:  Patient presented tachypneic with fever to 101.2, white count elevated to 11.7 and elevated LA to 10.0 concerning for sepsis. Started on Aztreonam and Vancomycin in ED. Weaned from BiPAP 5/30 > 2L [place]. Used Bipap for 3 hours last night as family requested it. -Nephrology following, appreciate recs: likely volume overload as well as HAP  -Blood cultures and urine cultures pending -Albuterol neb Q4H PRN -Continue vancomycin and Aztreonam until blood cultures negative x 48 hours  Abdominal pain: patient is TTP over suprapubic region. Reports that she makes minimal urine. Last BM 5/30. Already receiving broad spectrum antibiotics.  -continue to monitor on exam  Transaminatis: AST 288, ALT 81 > AST 230 ALT 107 > AST 195 ALT 114. Likely source due to infection vs medication - Amiodarone can cause transaminitis. INR 2.93 (on warfarin for PAF). Hep B surface antigen negative.  -consider RUQ [place] and Hep panel if no continued improvement  ESRD: T/Th/Sat; concern for electrolyte disturbance and fluid overload. Family notes she missed dialysis today due to not feeling well.   - Nephrology consulted in ED, appreciate recs -Continue daily Phoslo -Cinacalcet with HD -Daily BMET   T2DM: currently at home on [place]o 10 units.  Last A1c 09/2016 was 8.5%. No SSI used overnight. - SSI and ACQHS  CBG   HTN. On admission BP 177/86.  At home on Toprol XL 100 mg daily and Clonidine 0.1 mg BID.  - Continue Metoprolol tartrate 12.5mg  BID and Clonidine  - Held hydralazine 50 mg TID   HFpEF: 10/2016 with an EF of 61-44%, grade 2 diastolic dysfunction, s/pt TAVR with bioprosthetic prosthesis, mild MV regurgitation, PA 50 mmHg. - continue metoprolol tartrate 12.5mg  BID   PAF: Home meds include amiodarone, metoprolol, Coumadin. INR goal 2-3. INR 3.57.  - Continue home amiodarone, Coumadin  HLD.  At home on Lipitor 20 mg daily.  -Continue home med  FEN/GI:  renal diet if able to wean from BiPAP Prophylaxis: Heparin    Disposition: transfer to tele, CSW consulted for SNF Placement when medically ready  Subjective:  Patient feels tired and has some lower abdominal pain. States she rarely makes urine. No diarrhea. Last BM 5/30.   Objective: Temp:  [97.6 F (36.4 C)-98.2 F (36.8 C)] 97.6 F (36.4 C) (05/30 1645) Pulse Rate:  [56-65] 62 (05/31 0600) Resp:  [7-19] 11 (05/31 0600) BP: (122-141)/(46-54) 133/50 (05/31 0600) SpO2:  [99 %-100 %] 100 % (05/31 0600) Physical Exam: General: Thin elderly female resting in bed in NAD with [place] on getting HD.  Cardiovascular: RRR, no murmur Respiratory: CTAB, easy WOB on 2L [place] Abdomen: Soft, nondistended, TTP over suprapubic region, +BS  Extremities: no edema, warm  Laboratory:  Recent Labs Lab 02/16/17 1153 02/17/17 0242  WBC 11.7* 11.8*  HGB 9.6* 8.5*  HCT 31.1* 26.2*  PLT 148* 127*    Recent Labs Lab 02/16/17 1153 02/17/17 0242 02/17/17 1112 02/18/17 0532  NA 134* 135 135  132*  K 6.1* 3.9 4.1 3.9  CL 94* 97* 97* 97*  CO2 12* 26 26 24   BUN 46* 19 25* 34*  CREATININE 10.64* 5.45* 6.35* 7.43*  CALCIUM 7.8* 7.7* 7.5* 6.6*  PROT 7.0 6.3* 6.1*  --   BILITOT 1.5* 1.0 0.9  --   ALKPHOS 184* 165* 167*  --   ALT 81* 107* 108*  --   AST 288* 230* 205*  --   GLUCOSE 221* 119* 96 116*    INR 3.57>2.93 Lactic acid 10 >  2.5 > 0.8   Imaging/Diagnostic Tests: Dg Chest 2 View  Result Date: 02/16/2017 CLINICAL DATA:  Increased shortness of breath. EXAM: CHEST  2 VIEW COMPARISON:  10/22/2016. FINDINGS: Prior aortic valve repair. Cardiomegaly. Pulmonary vascular prominence. No pulmonary interstitial prominence. Interim clearing of previously identified interstitial edema from chest x-ray of 10/22/2016. No pleural effusion or pneumothorax. Right axillary stents again noted. IMPRESSION: 1. Prior cardiac valve repair. Cardiomegaly with mild pulmonary vascular prominence. 2. No evidence of pulmonary edema. Interim clearing of previously identified pulmonary interstitial edema from chest x-ray of 10/22/2016. Electronically Signed   By: Marcello Moores  Register   On: 02/16/2017 13:54   Sela Hilding, MD 02/18/2017, 6:40 AM PGY-1, [place] Intern pager: [phone], text pages welcome

## 2017-02-18 NOTE — Progress Notes (Signed)
Pt declines use.  Pt is resting comfortably at this time. RT will continue to monitor.

## 2017-02-18 NOTE — Telephone Encounter (Signed)
Entered in error

## 2017-02-18 NOTE — Progress Notes (Signed)
VASCULAR LAB PRELIMINARY  ARTERIAL  ABI completed:ABIs not ascertained secondary to non compressible vessels.     RIGHT    LEFT    PRESSURE WAVEFORM  PRESSURE WAVEFORM  BRACHIAL Dialysis Access  BRACHIAL 130 T  DP   DP    AT 300 B AT 300 B  PT 90 B PT 300 B  PER   PER    GREAT TOE  NA GREAT TOE  NA    RIGHT LEFT  ABI 2.31 2.31     Angela Trujillo, RVT 02/18/2017, 2:57 PM

## 2017-02-18 NOTE — Progress Notes (Signed)
Pt arrived from 4N via wheelchair accompanied by RN and family, A&Ox4, o O2 at 2L via [place], resp even and unlabored, no acute distress. VSS. Telemetry box #30, CCMD notified. Call light and telephone within reach, will continue to monitor.

## 2017-02-18 NOTE — Progress Notes (Signed)
ANTICOAGULATION CONSULT NOTE - Follow Up Consult  Pharmacy Consult for Warfarin Indication: atrial fibrillation  Patient Measurements: Height: 5\' 5"  (165.1 cm) Weight: 149 lb 0.5 oz (67.6 kg) IBW/kg (Calculated) : 57  Vital Signs: Temp: 98 F (36.7 C) (05/31 0708) Temp Source: Oral (05/31 0708) BP: 108/47 (05/31 1030) Pulse Rate: 64 (05/31 1030)  Labs:  Recent Labs  02/17/17 0242 02/17/17 1112 02/18/17 0532 02/18/17 0725  HGB 8.5*  --  8.1* 7.9*  HCT 26.2*  --  24.4* 23.5*  PLT 127*  --  125* 120*  LABPROT 36.6* 35.0* 31.2*  --   INR 3.57 3.39 2.93  --   CREATININE 5.45* 6.35* 7.43* 7.73*  CKTOTAL  --  341*  --   --     Estimated Creatinine Clearance: 4.9 mL/min (A) (by C-G formula based on SCr of 7.73 mg/dL (H)).  Assessment: 83 yo F presents on 5/29 with SOB. Hx of ESRD, thrombocytopenia and Afib on Coumadin 5mg  daily PTA. Pharmacy consulted to dose warfarin. Anticoagulation clinic visit notes show labile INRs with multiple dose changes. Most recent clinic visit on 5/21 shows INR of 1.4. Patient unable to report last dose of warfarin due to lethargy and requirement of BiPAP.  INR today has trended down into the therapeutic range (INR 2.93 << 3.39, goal of 2-3). A warfarin dose was entered for 5/30 however it was not given. Of note, patient with elevated LFTs on admission, but are trending down. Hgb low-stable at 8.5, platelets slightly low at 127. No obvious signs or symptoms of bleeding.   Goal of Therapy:  INR 2-3 Monitor platelets by anticoagulation protocol: Yes   Plan:  1. Warfarin 2 mg x 1 dose at 1800 today 2. Will continue to monitor for any signs/symptoms of bleeding and will follow up with PT/INR in the a.m.   Thank you for allowing pharmacy to be a part of this patient's care.  Alycia Rossetti, PharmD, BCPS Clinical Pharmacist Pager: [phone] Clinical phone for 02/18/2017 from 7a-3:30p: [phone] If after 3:30p, please call main pharmacy at:  [phone] 02/18/2017 11:13 AM

## 2017-02-19 ENCOUNTER — Inpatient Hospital Stay (HOSPITAL_COMMUNITY): Payer: Medicare Other

## 2017-02-19 LAB — BASIC METABOLIC PANEL
ANION GAP: 8 (ref 5–15)
BUN: 14 mg/dL (ref 6–20)
CALCIUM: 7.1 mg/dL — AB (ref 8.9–10.3)
CO2: 29 mmol/L (ref 22–32)
Chloride: 95 mmol/L — ABNORMAL LOW (ref 101–111)
Creatinine, Ser: 4.95 mg/dL — ABNORMAL HIGH (ref 0.44–1.00)
GFR calc non Af Amer: 7 mL/min — ABNORMAL LOW (ref >60)
GFR, EST AFRICAN AMERICAN: 8 mL/min — AB (ref >60)
GLUCOSE: 139 mg/dL — AB (ref 65–99)
Potassium: 3.8 mmol/L (ref 3.5–5.1)
Sodium: 132 mmol/L — ABNORMAL LOW (ref 135–145)

## 2017-02-19 LAB — GLUCOSE, CAPILLARY
GLUCOSE-CAPILLARY: 116 mg/dL — AB (ref 65–99)
GLUCOSE-CAPILLARY: 263 mg/dL — AB (ref 65–99)
GLUCOSE-CAPILLARY: 218 mg/dL — AB (ref 65–99)
Glucose-Capillary: 171 mg/dL — ABNORMAL HIGH (ref 65–99)

## 2017-02-19 LAB — CBC
HCT: 26 % — ABNORMAL LOW (ref 36.0–46.0)
HEMOGLOBIN: 8.3 g/dL — AB (ref 12.0–15.0)
MCH: 29 pg (ref 26.0–34.0)
MCHC: 31.9 g/dL (ref 30.0–36.0)
MCV: 90.9 fL (ref 78.0–100.0)
Platelets: 99 10*3/uL — ABNORMAL LOW (ref 150–400)
RBC: 2.86 MIL/uL — ABNORMAL LOW (ref 3.87–5.11)
RDW: 17.2 % — ABNORMAL HIGH (ref 11.5–15.5)
WBC: 8.5 10*3/uL (ref 4.0–10.5)

## 2017-02-19 LAB — PROTIME-INR
INR: 2.34
PROTHROMBIN TIME: 26 s — AB (ref 11.4–15.2)

## 2017-02-19 MED ORDER — ALBUTEROL SULFATE (2.5 MG/3ML) 0.083% IN NEBU
2.5000 mg | INHALATION_SOLUTION | Freq: Two times a day (BID) | RESPIRATORY_TRACT | Status: DC
  Administered 2017-02-19 – 2017-02-20 (×2): 2.5 mg via RESPIRATORY_TRACT
  Filled 2017-02-19 (×2): qty 3

## 2017-02-19 MED ORDER — WARFARIN SODIUM 5 MG PO TABS: 5.0000 mg | ORAL_TABLET | Freq: Once | ORAL | Status: DC

## 2017-02-19 MED ORDER — WARFARIN SODIUM 4 MG PO TABS
4.0000 mg | ORAL_TABLET | Freq: Once | ORAL | Status: AC
  Administered 2017-02-19: 4 mg via ORAL
  Filled 2017-02-19: qty 1

## 2017-02-19 MED ORDER — LEVOFLOXACIN 500 MG PO TABS
500.0000 mg | ORAL_TABLET | ORAL | Status: AC
  Administered 2017-02-19 – 2017-02-21 (×2): 500 mg via ORAL
  Filled 2017-02-19 (×3): qty 1

## 2017-02-19 NOTE — Progress Notes (Signed)
Family Medicine Teaching Service Daily Progress Note Intern Pager: [phone]  Patient name: Angela Trujillo Medical record number: [id number] Date of birth: [birth year] Age: 83 y.o. Gender: female  Primary Care Provider: Glendon Axe, MD Consultants: CCM, nephrology Code Status: FULL   Pt Overview and Major Events to Date:  5/29 admitted with respiratory distress, likely HAP  Assessment and Plan: Angela Trujillo is a 83 y.o. female presenting with respiratory distress. PMH is significant for ESRD, T2DM, thrombocytopenia, S/P TAVR,  HTN, HFpEF (G2DD), PAF   Acute Hypoxic Respiratory Distress:  Multifactorial from volume overload and HAP. Started on Aztreonam and Vancomycin in ED. Weaned from BiPAP 5/30 > 2.5L [place].  -Nephrology following, appreciate recs: likely volume overload as well as HAP, continue HD TTS -Blood cultures pending: NG 2 d and NG x 1 d -Albuterol neb Q4H PRN -Transition to po levaquin today 500mg  q48hrs, needs 2 more doses to complete 7 day course of ABX.  Transaminatis: AST 288>195, ALT 81 >114. Likely source due to infection vs medication - Amiodarone can cause transaminitis. INR 2.93 (on warfarin for PAF). Hep B surface antigen negative.  -monitor LFTs, consider RUQ [place] and Hep panel if no continued improvement  ESRD: HD T/Th/Sat   - Nephrology following - Continue daily Phoslo - Cinacalcet with HD - Daily BMET   T2DM: currently at home on Toujeo 10 units.  Last A1c 09/2016 was 8.5%. No SSI used overnight. - SSI and ACQHS CBG   HTN. At home on Toprol XL 100 mg daily, Clonidine 0.1 mg BID, and hydralazine 50 mg TID  - Continue Metoprolol tartrate 12.5mg  BID and Clonidine 0.1 mg BID - Holding hydralazine 50 mg TID   HFpEF: 10/2016 with an EF of 85-02%, grade 2 diastolic dysfunction, s/pt TAVR with bioprosthetic prosthesis, mild MV regurgitation, PA 50 mmHg. - continue metoprolol tartrate 12.5mg  BID   PAF: Home meds include amiodarone, metoprolol, Coumadin. INR  goal 2-3.  - Continue home amiodarone, Coumadin  HLD.  At home on Lipitor 20 mg daily.  -Continue home med  FEN/GI:  renal diet  Prophylaxis: coumadin   Disposition: approaching medical readiness, if does well on po ABX for 24 hrs could be discharged to SNF as early as 6/2 if bed available  Subjective:  States feels improved today. No CP, SOB, abd pain, n/v  Objective: Temp:  [97.9 F (36.6 C)-98.9 F (37.2 C)] 98.4 F (36.9 C) (06/01 0434) Pulse Rate:  [55-66] 56 (06/01 0434) Resp:  [15-17] 16 (06/01 0434) BP: (95-145)/(40-58) 134/42 (06/01 0434) SpO2:  [100 %] 100 % (06/01 0434) Weight:  [130 lb 8.2 oz (59.2 kg)-149 lb 0.5 oz (67.6 kg)] 130 lb 8.2 oz (59.2 kg) (05/31 2245) Physical Exam: General: Thin elderly female resting in bed in NAD with [place] in place Cardiovascular: RRR, no murmur Respiratory: CTAB, easy WOB on 2L  Abdomen: Soft, nondistended, nontender, +BS  Extremities: no edema, warm  Laboratory:  Recent Labs Lab 02/18/17 0532 02/18/17 0725 02/19/17 0502  WBC 10.6* 11.0* 8.5  HGB 8.1* 7.9* 8.3*  HCT 24.4* 23.5* 26.0*  PLT 125* 120* 99*    Recent Labs Lab 02/17/17 0242 02/17/17 1112 02/18/17 0532 02/18/17 0700 02/18/17 0725 02/19/17 0502  NA 135 135 132*  --  132* 132*  K 3.9 4.1 3.9  --  4.0 3.8  CL 97* 97* 97*  --  96* 95*  CO2 26 26 24   --  25 29  BUN 19 25* 34*  --  33* 14  CREATININE 5.45* 6.35* 7.43*  --  7.73* 4.95*  CALCIUM 7.7* 7.5* 6.6*  --  6.9* 7.1*  PROT 6.3* 6.1*  --  5.5*  --   --   BILITOT 1.0 0.9  --  0.8  --   --   ALKPHOS 165* 167*  --  133*  --   --   ALT 107* 108*  --  114*  --   --   AST 230* 205*  --  195*  --   --   GLUCOSE 119* 96 116*  --  99 139*    INR 3.57>2.93>2.34 Lactic acid 10 > 2.5 > 0.8   Imaging/Diagnostic Tests: Dg Chest 2 View  Result Date: 02/16/2017 CLINICAL DATA:  Increased shortness of breath. EXAM: CHEST  2 VIEW COMPARISON:  10/22/2016. FINDINGS: Prior aortic valve repair. Cardiomegaly.  Pulmonary vascular prominence. No pulmonary interstitial prominence. Interim clearing of previously identified interstitial edema from chest x-ray of 10/22/2016. No pleural effusion or pneumothorax. Right axillary stents again noted. IMPRESSION: 1. Prior cardiac valve repair. Cardiomegaly with mild pulmonary vascular prominence. 2. No evidence of pulmonary edema. Interim clearing of previously identified pulmonary interstitial edema from chest x-ray of 10/22/2016. Electronically Signed   By: Marcello Moores  Register   On: 02/16/2017 13:54   Bufford Lope, DO 02/19/2017, 7:04 AM PGY-1, [place] Intern pager: [phone], text pages welcome

## 2017-02-19 NOTE — Progress Notes (Signed)
Patient ID: Angela Trujillo, female   DOB: 12/16/1930, 83 y.o.   MRN: [id number]  [facility] Progress Note   Assessment/ Plan:   1. Respiratory distress:  suspected to be bi-factorial from pulmonary edema/volume overload and pneumonia. Respiratory status improved with ongoing management. Leukocytosis improving and she remains afebrile on the current antibiotics. She appears to be at her dry weight. Recommend ambulation and assessment of outpatient needs at this time. 2. End-stage renal disease: Hemodialysis tomorrow per usual outpatient Tuesday/Thursday/Saturday schedule. No acute dialysis needs noted at this time. We'll schedule her for dialysis tomorrow in case she is still here. 3. Hypertension:  blood pressure is acceptable, continue monitoring with ultrafiltration and hemodialysis. 4. Anemia: Consistent with anemia of chronic kidney disease, ordered aranesp. 5. Metabolic bone disease:  on VDRA for PTH suppression and start binder.  Subjective:   Reports to be feeling better-still remarks that she might be weak and not able to stand/move around .   Objective:   BP (!) 126/45   Pulse 65   Temp 98.7 F (37.1 C) (Oral)   Resp 16   Ht 5\' 5"  (1.651 m)   Wt 59.2 kg (130 lb 8.2 oz)   SpO2 100%   BMI 21.72 kg/m   Physical Exam: Gen: Resting comfortably in bed, daughter-in-law at bedside CVS: Pulse regular rhythm, normal rate, S1 and S2 normal Resp: Intermittent expiratory rhonchi otherwise clear to auscultation Abd: Soft, flat, nontender Ext: No lower extremity edema, right brachiocephalic fistula with good thrill  Labs: BMET  Recent Labs Lab 02/16/17 1153 02/17/17 0242 02/17/17 1112 02/18/17 0532 02/18/17 0725 02/19/17 0502  NA 134* 135 135 132* 132* 132*  K 6.1* 3.9 4.1 3.9 4.0 3.8  CL 94* 97* 97* 97* 96* 95*  CO2 12* 26 26 24 25 29   GLUCOSE 221* 119* 96 116* 99 139*  BUN 46* 19 25* 34* 33* 14  CREATININE 10.64* 5.45* 6.35* 7.43* 7.73* 4.95*  CALCIUM  7.8* 7.7* 7.5* 6.6* 6.9* 7.1*  PHOS  --   --  3.7  --  3.1  --    CBC  Recent Labs Lab 02/16/17 1153 02/17/17 0242 02/18/17 0532 02/18/17 0725 02/19/17 0502  WBC 11.7* 11.8* 10.6* 11.0* 8.5  NEUTROABS 9.5*  --   --   --   --   HGB 9.6* 8.5* 8.1* 7.9* 8.3*  HCT 31.1* 26.2* 24.4* 23.5* 26.0*  MCV 96.9 90.7 90.4 88.7 90.9  PLT 148* 127* 125* 120* 99*   Medications:    . albuterol  2.5 mg Nebulization BID  . amiodarone  200 mg Oral Daily  . calcium acetate  667 mg Oral Q breakfast  . cinacalcet  30 mg Oral Q T,Th,Sa-HD  . cloNIDine  0.1 mg Oral BID  . darbepoetin (ARANESP) injection - DIALYSIS  60 mcg Intravenous Q Thu-HD  . insulin aspart  0-9 Units Subcutaneous TID WC  . metoprolol tartrate  12.5 mg Oral BID  . sodium chloride flush  3 mL Intravenous Q12H  . warfarin  5 mg Oral ONCE-1800  . Warfarin - Pharmacist Dosing Inpatient   Does not apply [id number]   Elmarie Shiley, MD 02/19/2017, 10:40 AM

## 2017-02-19 NOTE — Progress Notes (Addendum)
ANTICOAGULATION & ANTIBIOTIC CONSULT NOTE - Follow Up Consult  Pharmacy Consult:  Coumadin + Vancomycin/Azactam Indication: atrial fibrillation + PNA  Patient Measurements: Height: 5\' 5"  (165.1 cm) Weight: 130 lb 8.2 oz (59.2 kg) IBW/kg (Calculated) : 57  Vital Signs: Temp: 98.7 F (37.1 C) (06/01 1005) Temp Source: Oral (06/01 1005) BP: 126/45 (06/01 1018) Pulse Rate: 65 (06/01 1015)  Labs:  Recent Labs  02/17/17 1112 02/18/17 0532 02/18/17 0725 02/19/17 0502  HGB  --  8.1* 7.9* 8.3*  HCT  --  24.4* 23.5* 26.0*  PLT  --  125* 120* 99*  LABPROT 35.0* 31.2*  --  26.0*  INR 3.39 2.93  --  2.34  CREATININE 6.35* 7.43* 7.73* 4.95*  CKTOTAL 341*  --   --   --     Estimated Creatinine Clearance: 7.6 mL/min (A) (by C-G formula based on SCr of 4.95 mg/dL (H)).   Assessment: 84 yo F presents on 02/16/17 with SOB. Hx of ESRD, thrombocytopenia and Afib on Coumadin 5mg  daily PTA. Pharmacy consulted to dose warfarin. Anticoagulation clinic visit notes show labile INRs with multiple dose changes. Most recent clinic visit on 5/21 shows INR of 1.4 and her last dose was on 02/16/17.  INR supra-therapeutic on admit and has trended down further today due to missed dose on 02/17/17 and reduced dose on 02/18/17.  Patient's LFTs are improving.  She is on Aranesp for anemia and her platelet count is trending down.  No bleeding reported.   Pharmacy also dosing vancomycin and aztreonam for PNA.  Patient is tolerating her HD sessions.  She is afebrile and her WBC has normalized.   Goal of Therapy:  INR 2-3 Monitor platelets by anticoagulation protocol: Yes  Vanc pre-HD level:  15-25 mcg/mL    Plan:  Coumadin 5mg  PO today Daily INR  Monitor for s/sx of bleeding   Continue vanc 750 mg IV qHD-TTS Aztreonam 500mg  IV Q12H Monitor HD schedule/tolerance, clinical progress, vanc level as indicated  Watch LFTs with amio/atorva/APAP F/U resuming home meds    Francia Verry D. Mina Marble, PharmD,  BCPS Pager:  [phone] 02/19/2017, 10:37 AM   ========================   Addendum: - narrowing abx to LVQ, which may increase the effect of Coumadin - reduce Coumadin to 4mg  PO today   Adriena Manfre D. Mina Marble, PharmD, BCPS Pager:  [phone] 02/19/2017, 12:29 PM

## 2017-02-19 NOTE — Discharge Instructions (Signed)

## 2017-02-19 NOTE — Progress Notes (Signed)
Pt declines use.

## 2017-02-19 NOTE — Progress Notes (Signed)
PT Cancellation Note  Patient Details Name: Angela Trujillo MRN: [id number] DOB: Jan 23, 1931   Cancelled Treatment:    Reason Eval/Treat Not Completed: Other (comment).  Two attempts made, during which time MD in to see pt then had her meal.  Will try later as time allows.   Ramond Dial 02/19/2017, 2:12 PM   Mee Hives, PT MS Acute Rehab Dept. Number: [place] and [place]

## 2017-02-19 NOTE — Care Management Important Message (Signed)
Important Message  Patient Details  Name: Angela Trujillo MRN: [id number] Date of Birth: 12-11-1930   Medicare Important Message Given:  Yes    Orbie Pyo 02/19/2017, 11:22 AM

## 2017-02-20 LAB — CBC
HCT: 26.8 % — ABNORMAL LOW (ref 36.0–46.0)
Hemoglobin: 8.7 g/dL — ABNORMAL LOW (ref 12.0–15.0)
MCH: 29.3 pg (ref 26.0–34.0)
MCHC: 32.5 g/dL (ref 30.0–36.0)
MCV: 90.2 fL (ref 78.0–100.0)
PLATELETS: 92 10*3/uL — AB (ref 150–400)
RBC: 2.97 MIL/uL — ABNORMAL LOW (ref 3.87–5.11)
RDW: 16.8 % — ABNORMAL HIGH (ref 11.5–15.5)
WBC: 8.9 10*3/uL (ref 4.0–10.5)

## 2017-02-20 LAB — PROTIME-INR
INR: 2.23
Prothrombin Time: 25.1 seconds — ABNORMAL HIGH (ref 11.4–15.2)

## 2017-02-20 LAB — COMPREHENSIVE METABOLIC PANEL
ALBUMIN: 2.4 g/dL — AB (ref 3.5–5.0)
ALT: 93 U/L — ABNORMAL HIGH (ref 14–54)
AST: 105 U/L — AB (ref 15–41)
Alkaline Phosphatase: 131 U/L — ABNORMAL HIGH (ref 38–126)
Anion gap: 11 (ref 5–15)
BUN: 29 mg/dL — AB (ref 6–20)
CHLORIDE: 92 mmol/L — AB (ref 101–111)
CO2: 23 mmol/L (ref 22–32)
Calcium: 6.7 mg/dL — ABNORMAL LOW (ref 8.9–10.3)
Creatinine, Ser: 7.28 mg/dL — ABNORMAL HIGH (ref 0.44–1.00)
GFR calc Af Amer: 5 mL/min — ABNORMAL LOW (ref >60)
GFR calc non Af Amer: 5 mL/min — ABNORMAL LOW (ref >60)
GLUCOSE: 164 mg/dL — AB (ref 65–99)
Potassium: 4.1 mmol/L (ref 3.5–5.1)
SODIUM: 126 mmol/L — AB (ref 135–145)
Total Bilirubin: 0.8 mg/dL (ref 0.3–1.2)
Total Protein: 6 g/dL — ABNORMAL LOW (ref 6.5–8.1)

## 2017-02-20 LAB — GLUCOSE, CAPILLARY
GLUCOSE-CAPILLARY: 157 mg/dL — AB (ref 65–99)
GLUCOSE-CAPILLARY: 124 mg/dL — AB (ref 65–99)
GLUCOSE-CAPILLARY: 228 mg/dL — AB (ref 65–99)

## 2017-02-20 LAB — PROCALCITONIN: PROCALCITONIN: 7.09 ng/mL

## 2017-02-20 MED ORDER — WARFARIN SODIUM 5 MG PO TABS
5.0000 mg | ORAL_TABLET | Freq: Once | ORAL | Status: AC
  Administered 2017-02-20: 5 mg via ORAL
  Filled 2017-02-20: qty 1

## 2017-02-20 MED ORDER — PROMETHAZINE HCL 25 MG PO TABS
12.5000 mg | ORAL_TABLET | Freq: Four times a day (QID) | ORAL | Status: DC | PRN
  Administered 2017-02-20: 12.5 mg via ORAL
  Filled 2017-02-20: qty 1

## 2017-02-20 MED ORDER — HEPARIN SODIUM (PORCINE) 1000 UNIT/ML DIALYSIS
40.0000 [IU]/kg | INTRAMUSCULAR | Status: DC | PRN
  Administered 2017-02-20: 2400 [IU] via INTRAVENOUS_CENTRAL

## 2017-02-20 NOTE — Progress Notes (Signed)
ANTICOAGULATION CONSULT  Pharmacy Consult:  Warfarin Indication: atrial fibrillation  Patient Measurements: Height: 5\' 5"  (165.1 cm) Weight: 130 lb 8.2 oz (59.2 kg) IBW/kg (Calculated) : 57  Vital Signs: Temp: 98.1 F (36.7 C) (06/02 0357) Temp Source: Oral (06/02 0357) BP: 130/47 (06/02 0357) Pulse Rate: 63 (06/02 0357)  Labs:  Recent Labs  02/17/17 1112  02/18/17 0532 02/18/17 0725 02/19/17 0502 02/20/17 0546  HGB  --   < > 8.1* 7.9* 8.3* 8.7*  HCT  --   < > 24.4* 23.5* 26.0* 26.8*  PLT  --   < > 125* 120* 99* 92*  LABPROT 35.0*  --  31.2*  --  26.0* 25.1*  INR 3.39  --  2.93  --  2.34 2.23  CREATININE 6.35*  --  7.43* 7.73* 4.95* 7.28*  CKTOTAL 341*  --   --   --   --   --   < > = values in this interval not displayed.  Estimated Creatinine Clearance: 5.2 mL/min (A) (by C-G formula based on SCr of 7.28 mg/dL (H)).   Assessment: 83 yo F presents on 02/16/17 with SOB. Hx of ESRD, thrombocytopenia and Afib on Coumadin 5mg  daily PTA. Pharmacy consulted to dose warfarin. Anticoagulation clinic visit notes show labile INRs with multiple dose changes. Most recent clinic visit on 5/21 shows INR of 1.4 and her last dose PTA was on 02/16/17 and INR was supra-therapeutic on admit.  INR is therapeutic today at 2.23 and continues to trend down likely due to missed dose on 02/17/17 and reduced dose on 02/18/17.  Patient's LFTs are improving.  She is on Aranesp for anemia and her platelet count is trending down. No bleeding reported.  PTA dose: 5mg /day (dose since 5/21)  Drug interactions: levofloxacin started 6/1 for pneumonia can increase INR. PTA amiodarone continued inpatient.   Given continued INR trend down, will give full PTA dose tonight.   Goal of Therapy:  INR 2-3 Monitor platelets by anticoagulation protocol: Yes     Plan:  Warfarin 5mg  PO today Daily INR  Monitor for s/sx of bleeding and drug interactions  Demetrius Charity, PharmD Acute Care Pharmacy Resident   Pager: [phone] 02/20/2017

## 2017-02-20 NOTE — Progress Notes (Signed)
Family Medicine Teaching Service Daily Progress Note Intern Pager: [phone]  Patient name: Angela Trujillo Medical record number: [id number] Date of birth: 01/30/1931 Age: 83 y.o. Gender: female  Primary Care Provider: Glendon Axe, MD Consultants: CCM, nephrology Code Status: FULL   Pt Overview and Major Events to Date:  5/29 admitted with respiratory distress, likely HAP  Assessment and Plan: Angela Trujillo is a 83 y.o. female presenting with respiratory distress. PMH is significant for ESRD, T2DM, thrombocytopenia, S/P TAVR,  HTN, HFpEF (G2DD), PAF   Acute Hypoxic Respiratory Distress:  Afebrile overnight.  Multifactorial from volume overload and HAP.  Weaned from BiPAP 5/30.  Currently on 2L [place], saturating well.  Procal 12.15 > 7.09 -Nephrology following, appreciate recs: likely volume overload as well as HAP, continue HD TTS -Blood cultures pending: NG 3 d and NG x 2 d -Albuterol neb Q4H PRN -Levaquin 560m q48hrs, needs 1 more dose to complete 7 day course of ABX.  Transaminatis: AST [id number] ALT 81 >114>93. Likely source due to infection vs medication. Amiodarone can cause transaminitis. INR 2.23 (on warfarin for PAF). Hep B surface antigen negative.  -monitor LFTs, consider RUQ [place] Hep panel if no continued improvement  ESRD: HD T/Th/Sat   - Nephrology following - Continue daily Phoslo - Cinacalcet with HD - Daily BMET   T2DM: currently at home on Toujeo 10 units.  Last A1c 09/2016 was 8.5%. Required 12 u total/ 24 hours. - SSI and ACQHS CBG   HTN. At home on Toprol XL 100 mg daily, Clonidine 0.1 mg BID, and hydralazine 50 mg TID  - Continue Metoprolol tartrate 12.5732mBID and Clonidine 0.1 mg BID - Holding hydralazine 50 mg TID   HFpEF: 10/2016 with an EF of 5599-37%grade 2 diastolic dysfunction, s/pt TAVR with bioprosthetic prosthesis, mild MV regurgitation, PA 50 mmHg. - continue metoprolol tartrate 12.32m9mID   PAF: Home meds include amiodarone,  metoprolol, Coumadin. INR goal 2-3.  - Continue home amiodarone, Coumadin  HLD.  At home on Lipitor 20 mg daily.  -Continue home med  FEN/GI:  renal diet  Prophylaxis: coumadin   Disposition:  Medically ready for discharge.  Family will convene this afternoon to determine if she will go to SNF vs come home with HH[facility]  Patient reports she feels fine.  She is hesitant to go to SNF.  I spoke to patient's daughter, who notes that family has not yet met to determine if mother will go to SNF vs come home w/ HH.  She will come by this afternoon and have decision.    Objective: Temp:  [98 F (36.7 C)-98.7 F (37.1 C)] 98.1 F (36.7 C) (06/02 0357) Pulse Rate:  [63-66] 63 (06/02 0357) Resp:  [18] 18 (06/02 0357) BP: (114-130)/(41-50) 130/47 (06/02 0357) SpO2:  [94 %-100 %] 96 % (06/02 0851) Weight:  [130 lb 8.2 oz (59.2 kg)] 130 lb 8.2 oz (59.2 kg) (06/01 2106) Physical Exam: General: Thin elderly female resting in bed in HD with [place] in place, NAD, rouses easily Cardiovascular: RRR, no murmur Respiratory: CTAB, normal WOB on 2L [place] Abdomen: flat, Soft, nondistended, nontender, +BS  Extremities: no edema, warm Psych: mood stable, speech normal  Laboratory:  Recent Labs Lab 02/18/17 0725 02/19/17 0502 02/20/17 0546  WBC 11.0* 8.5 8.9  HGB 7.9* 8.3* 8.7*  HCT 23.5* 26.0* 26.8*  PLT 120* 99* 92*    Recent Labs Lab 02/17/17 1112  02/18/17 0700 02/18/17 0725 02/19/17 0502 02/20/17 0546  NA 135  < >  --  132* 132* 126*  K 4.1  < >  --  4.0 3.8 4.1  CL 97*  < >  --  96* 95* 92*  CO2 26  < >  --  _0 BUN 25*  < >  --  33* 14 29*  CREATININE 6.35*  < >  --  7.73* 4.95* 7.28*  CALCIUM 7.5*  < >  --  6.9* 7.1* 6.7*  PROT 6.1*  --  5.5*  --   --  6.0*  BILITOT 0.9  --  0.8  --   --  0.8  ALKPHOS 167*  --  133*  --   --  131*  ALT 108*  --  114*  --   --  93*  AST 205*  --  195*  --   --  105*  GLUCOSE 96  < >  --  99 139* 164*  < > = values in this  interval not displayed.  INR 3.57>2.93>2.34 Lactic acid 10 > 2.5 > 0.8   Imaging/Diagnostic Tests: Dg Chest 2 View  Result Date: 02/16/2017 CLINICAL DATA:  Increased shortness of breath. EXAM: CHEST  2 VIEW COMPARISON:  10/22/2016. FINDINGS: Prior aortic valve repair. Cardiomegaly. Pulmonary vascular prominence. No pulmonary interstitial prominence. Interim clearing of previously identified interstitial edema from chest x-ray of 10/22/2016. No pleural effusion or pneumothorax. Right axillary stents again noted. IMPRESSION: 1. Prior cardiac valve repair. Cardiomegaly with mild pulmonary vascular prominence. 2. No evidence of pulmonary edema. Interim clearing of previously identified pulmonary interstitial edema from chest x-ray of 10/22/2016. Electronically Signed   By: Marcello Moores  Register   On: 02/16/2017 13:54   Dg Lumbar Spine 2-3 Views  Result Date: 02/19/2017 CLINICAL DATA:  Acute bilateral lower back pain after fall from bed yesterday. EXAM: LUMBAR SPINE - 2-3 VIEW COMPARISON:  CT scan of August 31, 2016. FINDINGS: No fracture or spondylolisthesis is noted. Mild degenerative disc disease is noted at L4-5 and L5-S1. Mild dextroscoliosis of lower thoracic and upper lumbar spine is noted. IMPRESSION: Multilevel degenerative disc disease. No acute abnormality seen the lumbar spine. Electronically Signed   By: Marijo Conception, M.D.   On: 02/19/2017 16:35   Janora Norlander, DO 02/20/2017, 9:07 AM PGY-3, [place] Intern pager: [phone], text pages welcome

## 2017-02-20 NOTE — Progress Notes (Signed)
HD tx initiated via 15G x2 w/o problem, pull/push/flush well, will cont to monitor while on HD tx

## 2017-02-20 NOTE — Procedures (Signed)
Patient seen on Hemodialysis. QB 400, UF goal 2L Treatment adjusted as needed.  Elmarie Shiley MD [facility]. Office # [phone] Pager # [phone] 10:22 AM

## 2017-02-20 NOTE — Progress Notes (Signed)
HD tx completed w/o problem, UF goal met, blood rinsed back, report called to Jaynie Crumble, RN

## 2017-02-21 LAB — RENAL FUNCTION PANEL
ALBUMIN: 2.1 g/dL — AB (ref 3.5–5.0)
Anion gap: 11 (ref 5–15)
BUN: 17 mg/dL (ref 6–20)
CALCIUM: 7.2 mg/dL — AB (ref 8.9–10.3)
CO2: 27 mmol/L (ref 22–32)
Chloride: 93 mmol/L — ABNORMAL LOW (ref 101–111)
Creatinine, Ser: 4.31 mg/dL — ABNORMAL HIGH (ref 0.44–1.00)
GFR, EST AFRICAN AMERICAN: 10 mL/min — AB (ref >60)
GFR, EST NON AFRICAN AMERICAN: 9 mL/min — AB (ref >60)
Glucose, Bld: 188 mg/dL — ABNORMAL HIGH (ref 65–99)
Phosphorus: 2.1 mg/dL — ABNORMAL LOW (ref 2.5–4.6)
Potassium: 3.4 mmol/L — ABNORMAL LOW (ref 3.5–5.1)
SODIUM: 131 mmol/L — AB (ref 135–145)

## 2017-02-21 LAB — CULTURE, BLOOD (ROUTINE X 2)
CULTURE: NO GROWTH
Special Requests: ADEQUATE

## 2017-02-21 LAB — GLUCOSE, CAPILLARY
GLUCOSE-CAPILLARY: 158 mg/dL — AB (ref 65–99)
GLUCOSE-CAPILLARY: 248 mg/dL — AB (ref 65–99)
Glucose-Capillary: 144 mg/dL — ABNORMAL HIGH (ref 65–99)
Glucose-Capillary: 241 mg/dL — ABNORMAL HIGH (ref 65–99)

## 2017-02-21 LAB — PROTIME-INR
INR: 1.74
Prothrombin Time: 20.6 seconds — ABNORMAL HIGH (ref 11.4–15.2)

## 2017-02-21 MED ORDER — WARFARIN SODIUM 5 MG PO TABS
5.0000 mg | ORAL_TABLET | Freq: Once | ORAL | Status: AC
  Administered 2017-02-21: 5 mg via ORAL
  Filled 2017-02-21: qty 1

## 2017-02-21 NOTE — Progress Notes (Signed)
Physical Therapy Treatment Patient Details Name: Angela Trujillo MRN: [id number] DOB: 1931/02/19 Today's Date: 02/21/2017    History of Present Illness Patient is an 83 yo female admitted 02/16/17 with acute hypoxic respiratory distress. PMH is significant for ESRD on HD, DM, thrombocytopenia, severe AS S/P TAVR, HTN, HFpEF, PAF, HLD     PT Comments    Pt presents with improved mobility and tolerance for activity. Pt is able to perform gait without an AD and does not require O2 this session. Pt's family is present and will provide 24/7 care for the next couple if weeks. Pt will benefit from HHPT when she returns home.     Follow Up Recommendations  Home health PT;Other (comment) (had [facility] previously)     Equipment Recommendations  None recommended by PT    Recommendations for Other Services       Precautions / Restrictions Precautions Precautions: Fall Restrictions Weight Bearing Restrictions: No    Mobility  Bed Mobility Overal bed mobility: Modified Independent Bed Mobility: Supine to Sit     Supine to sit: Supervision     General bed mobility comments: supervision for safety  Transfers Overall transfer level: Needs assistance Equipment used: Rolling walker (2 wheeled) Transfers: Sit to/from Stand Sit to Stand: Supervision         General transfer comment: supervision for safety from EOB and commoe  Ambulation/Gait Ambulation/Gait assistance: Min guard;Supervision Ambulation Distance (Feet): 250 Feet Assistive device: Rolling walker (2 wheeled);None Gait Pattern/deviations: Step-through pattern Gait velocity: decreased Gait velocity interpretation: Below normal speed for age/gender General Gait Details: mininal staggering to right withtout and AD. Improved sequencing with gait without RW. Pt with improved sequencing and gait speed   Stairs            Wheelchair Mobility    Modified Rankin (Stroke Patients Only)       Balance Overall  balance assessment: Needs assistance Sitting-balance support: No upper extremity supported;Feet supported Sitting balance-Leahy Scale: Good     Standing balance support: No upper extremity supported Standing balance-Leahy Scale: Fair                              Cognition Arousal/Alertness: Awake/alert Behavior During Therapy: WFL for tasks assessed/performed Overall Cognitive Status: Within Functional Limits for tasks assessed                                        Exercises      General Comments        Pertinent Vitals/Pain Pain Assessment: No/denies pain    Home Living                      Prior Function            PT Goals (current goals can now be found in the care plan section) Acute Rehab PT Goals Patient Stated Goal: None stated PT Goal Formulation: With patient/family Time For Goal Achievement: 02/28/17 Potential to Achieve Goals: Good Progress towards PT goals: Progressing toward goals    Frequency    Min 3X/week      PT Plan Discharge plan needs to be updated    Co-evaluation              AM-PAC PT "6 Clicks" Daily Activity  Outcome Measure  Difficulty turning over in bed (including adjusting  bedclothes, sheets and blankets)?: None Difficulty moving from lying on back to sitting on the side of the bed? : None Difficulty sitting down on and standing up from a chair with arms (e.g., wheelchair, bedside commode, etc,.)?: A Little Help needed moving to and from a bed to chair (including a wheelchair)?: A Little Help needed walking in hospital room?: A Little Help needed climbing 3-5 steps with a railing? : A Little 6 Click Score: 20    End of Session Equipment Utilized During Treatment: Gait belt Activity Tolerance: Patient tolerated treatment well Patient left: in chair;with call bell/phone within reach;with family/visitor present Nurse Communication: Mobility status PT Visit Diagnosis: Unsteadiness  on feet (R26.81);Other abnormalities of gait and mobility (R26.89);Muscle weakness (generalized) (M62.81)     Time: 5686-1683 PT Time Calculation (min) (ACUTE ONLY): 22 min  Charges:  $Gait Training: 8-22 mins                    G Codes:       Scheryl Marten PT, DPT  [phone]    Jacqulyn Liner Sloan Leiter 02/21/2017, 11:16 AM

## 2017-02-21 NOTE — Progress Notes (Signed)
Patient ID: Angela Trujillo, female   DOB: 11-04-1930, 83 y.o.   MRN: [id number]  [facility] Progress Note   Assessment/ Plan:   1. Respiratory distress:  suspected to be bi-factorial from pulmonary edema/volume overload and pneumonia. Respiratory status improved with ongoing management. Significantly better with antibiotic therapy and ultrafiltration/hemodialysis. Given her deconditioning, ongoing physical therapy with plans for discharge noted. 2. End-stage renal disease: Hemodialysis usually on a Tuesday/Thursday/Saturday schedule. No acute dialysis needs noted at this time.  3. Hypertension:  blood pressure is acceptable, continue monitoring with ultrafiltration and hemodialysis. 4. Anemia: Consistent with anemia of chronic kidney disease, ordered aranesp. 5. Metabolic bone disease:  on VDRA for PTH suppression and start binder.  Subjective:   Fatigued overnight after hemodialysis earlier in the day. Started working with physical therapy yesterday and anticipate discharge probably tomorrow.    Objective:   BP (!) 136/49 (BP Location: Left Arm)   Pulse 72   Temp 98.6 F (37 C)   Resp 16   Ht 5\' 5"  (1.651 m)   Wt 59.3 kg (130 lb 11.7 oz)   SpO2 95%   BMI 21.76 kg/m   Physical Exam: Gen: Resting comfortably in recliner CVS: Pulse regular rhythm, normal rate, S1 and S2 normal Resp: Poor inspiratory effort with decreased breath sounds over bases otherwise clear to auscultation Abd: Soft, flat, nontender Ext: No lower extremity edema, right brachiocephalic fistula with good thrill  Labs: BMET  Recent Labs Lab 02/17/17 0242 02/17/17 1112 02/18/17 0532 02/18/17 0725 02/19/17 0502 02/20/17 0546 02/21/17 0459  NA 135 135 132* 132* 132* 126* 131*  K 3.9 4.1 3.9 4.0 3.8 4.1 3.4*  CL 97* 97* 97* 96* 95* 92* 93*  CO2 26 26 24 25 29 23 27   GLUCOSE 119* 96 116* 99 139* 164* 188*  BUN 19 25* 34* 33* 14 29* 17  CREATININE 5.45* 6.35* 7.43* 7.73* 4.95* 7.28* 4.31*   CALCIUM 7.7* 7.5* 6.6* 6.9* 7.1* 6.7* 7.2*  PHOS  --  3.7  --  3.1  --   --  2.1*   CBC  Recent Labs Lab 02/16/17 1153  02/18/17 0532 02/18/17 0725 02/19/17 0502 02/20/17 0546  WBC 11.7*  < > 10.6* 11.0* 8.5 8.9  NEUTROABS 9.5*  --   --   --   --   --   HGB 9.6*  < > 8.1* 7.9* 8.3* 8.7*  HCT 31.1*  < > 24.4* 23.5* 26.0* 26.8*  MCV 96.9  < > 90.4 88.7 90.9 90.2  PLT 148*  < > 125* 120* 99* 92*  < > = values in this interval not displayed. Medications:    . amiodarone  200 mg Oral Daily  . calcium acetate  667 mg Oral Q breakfast  . cinacalcet  30 mg Oral Q T,Th,Sa-HD  . cloNIDine  0.1 mg Oral BID  . darbepoetin (ARANESP) injection - DIALYSIS  60 mcg Intravenous Q Thu-HD  . insulin aspart  0-9 Units Subcutaneous TID WC  . levofloxacin  500 mg Oral Q48H  . metoprolol tartrate  12.5 mg Oral BID  . sodium chloride flush  3 mL Intravenous Q12H  . warfarin  5 mg Oral ONCE-1800  . Warfarin - Pharmacist Dosing Inpatient   Does not apply [id number]   Elmarie Shiley, MD 02/21/2017, 11:38 AM

## 2017-02-21 NOTE — Progress Notes (Signed)
Family Medicine Teaching Service Daily Progress Note Intern Pager: [phone]  Patient name: Angela Trujillo Medical record number: [id number] Date of birth: 1931-09-04 Age: 83 y.o. Gender: female  Primary Care Provider: Glendon Axe, MD Consultants: CCM, nephrology Code Status: FULL   Pt Overview and Major Events to Date:  5/29 admitted with respiratory distress, likely HAP  Assessment and Plan: Angela Trujillo is a 83 y.o. female presenting with respiratory distress. PMH is significant for ESRD, T2DM, thrombocytopenia, S/P TAVR,  HTN, HFpEF (G2DD), PAF   Acute Hypoxic Respiratory Distress, Resolved:  Afebrile overnight, on room air.  Multifactorial from volume overload and HAP.  Weaned from BiPAP 5/30.On room air, saturating well. -Nephrology following, appreciate recs: likely volume overload as well as HAP, continue HD TTS -Blood cultures NGTD -Albuterol neb Q4H PRN -Levaquin 500mg  q48hrs, needs 1 more dose after today complete 7 day course of ABX.  Transaminatis: AST [id number], ALT 81 >114>93. Likely source due to infection vs medication. Amiodarone can cause transaminitis. INR 1.74 (on warfarin for PAF). Hep B surface antigen negative.  -monitor LFTs, consider RUQ [place] and Hep panel if no continued improvement  ESRD: HD T/Th/Sat   - Nephrology following - Continue daily Phoslo - Cinacalcet with HD - Daily BMET   T2DM: currently at home on Toujeo 10 units.  Last A1c 09/2016 was 8.5%. Required 5U total/ 24 hours. Most recent CBG 158. - SSI and ACQHS CBG   HTN. At home on Toprol XL 100 mg daily, Clonidine 0.1 mg BID, and hydralazine 50 mg TID  - Continue Metoprolol tartrate 12.5mg  BID and Clonidine 0.1 mg BID - Holding hydralazine 50 mg TID   HFpEF: 10/2016 with an EF of 13-08%, grade 2 diastolic dysfunction, s/pt TAVR with bioprosthetic prosthesis, mild MV regurgitation, PA 50 mmHg. - continue metoprolol tartrate 12.5mg  BID   PAF: Home meds include amiodarone, metoprolol,  Coumadin. INR goal 2-3.  - Continue home amiodarone, Coumadin  HLD.  At home on Lipitor 20 mg daily.  -Continue home med  FEN/GI:  renal diet  Prophylaxis: coumadin   Disposition:  Medically ready for discharge; home today after PT.   Subjective:  Patient reports she feels fine.  She does not want to go to SNF. Son in room wants PT to come work with patient before discharge home. Lives at home with 2 sons.  Objective: Temp:  [98 F (36.7 C)-98.7 F (37.1 C)] 98.6 F (37 C) (06/03 0419) Pulse Rate:  [65-72] 67 (06/03 0419) Resp:  [14-20] 16 (06/03 0419) BP: (108-150)/(47-60) 147/52 (06/03 0419) SpO2:  [95 %-100 %] 95 % (06/03 0419) Weight:  [130 lb 11.7 oz (59.3 kg)-134 lb 4.2 oz (60.9 kg)] 130 lb 11.7 oz (59.3 kg) (06/02 1403) Physical Exam: General: Thin elderly female resting in bed, NAD, A&O Cardiovascular: RRR, no murmur Respiratory: CTAB, normal WOB Abdomen: flat, Soft, nondistended, nontender, +BS  Extremities: no edema, warm Psych: mood stable, speech normal  Laboratory:  Recent Labs Lab 02/18/17 0725 02/19/17 0502 02/20/17 0546  WBC 11.0* 8.5 8.9  HGB 7.9* 8.3* 8.7*  HCT 23.5* 26.0* 26.8*  PLT 120* 99* 92*    Recent Labs Lab 02/17/17 1112  02/18/17 0700  02/19/17 0502 02/20/17 0546 02/21/17 0459  NA 135  < >  --   < > 132* 126* 131*  K 4.1  < >  --   < > 3.8 4.1 3.4*  CL 97*  < >  --   < > 95* 92* 93*  CO2 26  < >  --   < >  29 23 27   BUN 25*  < >  --   < > 14 29* 17  CREATININE 6.35*  < >  --   < > 4.95* 7.28* 4.31*  CALCIUM 7.5*  < >  --   < > 7.1* 6.7* 7.2*  PROT 6.1*  --  5.5*  --   --  6.0*  --   BILITOT 0.9  --  0.8  --   --  0.8  --   ALKPHOS 167*  --  133*  --   --  131*  --   ALT 108*  --  114*  --   --  93*  --   AST 205*  --  195*  --   --  105*  --   GLUCOSE 96  < >  --   < > 139* 164* 188*  < > = values in this interval not displayed.  INR 3.57>2.93>2.34 Lactic acid 10 > 2.5 > 0.8   Imaging/Diagnostic Tests: Dg Chest 2  View  Result Date: 02/16/2017 CLINICAL DATA:  Increased shortness of breath. EXAM: CHEST  2 VIEW COMPARISON:  10/22/2016. FINDINGS: Prior aortic valve repair. Cardiomegaly. Pulmonary vascular prominence. No pulmonary interstitial prominence. Interim clearing of previously identified interstitial edema from chest x-ray of 10/22/2016. No pleural effusion or pneumothorax. Right axillary stents again noted. IMPRESSION: 1. Prior cardiac valve repair. Cardiomegaly with mild pulmonary vascular prominence. 2. No evidence of pulmonary edema. Interim clearing of previously identified pulmonary interstitial edema from chest x-ray of 10/22/2016. Electronically Signed   By: Marcello Moores  Register   On: 02/16/2017 13:54   Dg Lumbar Spine 2-3 Views  Result Date: 02/19/2017 CLINICAL DATA:  Acute bilateral lower back pain after fall from bed yesterday. EXAM: LUMBAR SPINE - 2-3 VIEW COMPARISON:  CT scan of August 31, 2016. FINDINGS: No fracture or spondylolisthesis is noted. Mild degenerative disc disease is noted at L4-5 and L5-S1. Mild dextroscoliosis of lower thoracic and upper lumbar spine is noted. IMPRESSION: Multilevel degenerative disc disease. No acute abnormality seen the lumbar spine. Electronically Signed   By: Marijo Conception, M.D.   On: 02/19/2017 16:35   Katheren Shams, DO 02/21/2017, 7:59 AM PGY-3, [place] Intern pager: [phone], text pages welcome

## 2017-02-21 NOTE — Progress Notes (Addendum)
[place] Consult:  Warfarin Indication: atrial fibrillation  Patient Measurements: Height: 5\' 5"  (165.1 cm) Weight: 130 lb 11.7 oz (59.3 kg) IBW/kg (Calculated) : 57  Vital Signs: Temp: 98.6 F (37 C) (06/03 0419) BP: 147/52 (06/03 0419) Pulse Rate: 67 (06/03 0419)  Labs:  Recent Labs  02/19/17 0502 02/20/17 0546 02/21/17 0459  HGB 8.3* 8.7*  --   HCT 26.0* 26.8*  --   PLT 99* 92*  --   LABPROT 26.0* 25.1* 20.6*  INR 2.34 2.23 1.74  CREATININE 4.95* 7.28* 4.31*    Estimated Creatinine Clearance: 8.7 mL/min (A) (by C-G formula based on SCr of 4.31 mg/dL (H)).   Assessment: 83 yo F presents on 02/16/17 with SOB. Hx of ESRD, thrombocytopenia and Afib on Coumadin 5mg  daily PTA. Pharmacy consulted to dose warfarin. Anticoagulation clinic visit notes show labile INRs with multiple dose changes. Most recent clinic visit on 5/21 shows INR of 1.4 and her last dose PTA was on 02/16/17 and INR was supra-therapeutic on admit.  INR is subtherapeutic today at 1.74 and continues to trend down likely due to missed dose on 02/17/17 and reduced dose on 02/18/17.  Patient's LFTs are improving.  She is on Aranesp for anemia and her platelet count has been trending down. No bleeding reported.  PTA dose: 5mg /day (dose since 5/21)  Drug interactions: levofloxacin started 6/1 for pneumonia can increase INR. PTA amiodarone continued inpatient.   Given continued INR trend down, will give another full PTA dose tonight.   Goal of Therapy:  INR 2-3 Monitor platelets by anticoagulation protocol: Yes     Plan:  Warfarin 5mg  PO today again Will not bridge per discussion w/ FMTS Daily INR  Monitor for s/sx of bleeding and drug interactions  Demetrius Charity, PharmD Acute Care Pharmacy Resident  Pager: [phone] 02/21/2017

## 2017-02-21 NOTE — Clinical Social Work Note (Signed)
At this time PT is recommending [place] Worker will sign off for now as social work intervention is no longer needed. Please consult [place] again if new need arises.   [place], [place]

## 2017-02-22 LAB — PROTIME-INR
INR: 1.86
Prothrombin Time: 21.7 seconds — ABNORMAL HIGH (ref 11.4–15.2)

## 2017-02-22 LAB — CULTURE, BLOOD (ROUTINE X 2)
CULTURE: NO GROWTH
SPECIAL REQUESTS: ADEQUATE

## 2017-02-22 LAB — GLUCOSE, CAPILLARY: Glucose-Capillary: 183 mg/dL — ABNORMAL HIGH (ref 65–99)

## 2017-02-22 MED ORDER — WARFARIN SODIUM 6 MG PO TABS: 6.0000 mg | ORAL_TABLET | Freq: Once | ORAL | Status: DC

## 2017-02-22 NOTE — Progress Notes (Signed)
Pt to discharge home with [facility], discharge instructions, medications and follow up appointments discussed and reviewed with pt and daughter at bedside, verbalized understanding. Telemetry dc'ed, CCMD notified. IV discontinued, catheter intact, pressure applied to site x1 minute. Pt escorted out of the unit in wheelchair, took all belongings with them.

## 2017-02-22 NOTE — Addendum Note (Signed)
Addendum  created 02/22/17 1001 by Merrell Borsuk, MD   Sign clinical note    

## 2017-02-22 NOTE — Progress Notes (Signed)
Occupational Therapy Treatment Patient Details Name: Angela Trujillo MRN: [id number] DOB: 03/19/1931 Today's Date: 02/22/2017    History of present illness Patient is an 83 yo female admitted 02/16/17 with acute hypoxic respiratory distress. PMH is significant for ESRD on HD, DM, thrombocytopenia, severe AS S/P TAVR, HTN, HFpEF, PAF, HLD    OT comments  Pt progressing towards established goals and performed ADLs and functional mobility with supervision-Min guard A. Pt dressed in preparation for dc and requires Min guard A for safety as well as daughter assisting as needed. Pt's daughter confirmed that she will be present at dc for initial supervision and that pt has all needed DME/AE. Provided all education and answered pt and family questions. Recommend dc home with initial supervision to increase safety.    Follow Up Recommendations  Supervision/Assistance - 24 hour;No OT follow up    Equipment Recommendations  Other (comment) (TBD at next venue of care)    Recommendations for Other Services      Precautions / Restrictions Precautions Precautions: Fall Restrictions Weight Bearing Restrictions: No       Mobility Bed Mobility Overal bed mobility: Modified Independent Bed Mobility: Supine to Sit     Supine to sit: Modified independent (Device/Increase time)     General bed mobility comments: Increased time  Transfers Overall transfer level: Needs assistance Equipment used: Rolling walker (2 wheeled) Transfers: Sit to/from Stand Sit to Stand: Supervision         General transfer comment: supervision for safety from EOB and commoe    Balance Overall balance assessment: Needs assistance Sitting-balance support: No upper extremity supported;Feet supported Sitting balance-Leahy Scale: Good Sitting balance - Comments: Assist to maintain balance during ADL tasks.    Standing balance support: No upper extremity supported Standing balance-Leahy Scale: Good Standing  balance comment: Able to perform functional mobility without RW and no LOB                           ADL either performed or assessed with clinical judgement   ADL Overall ADL's : Needs assistance/impaired                 Upper Body Dressing : Sitting;Set up;Supervision/safety;With caregiver independent assisting Upper Body Dressing Details (indicate cue type and reason): Pt donned shirt and requires Min A from her daughter to find sleeves Lower Body Dressing: Sit to/from stand;Min guard Lower Body Dressing Details (indicate cue type and reason): Pt donned pants with Min gaurd A in standing. Demosntrates good ROM and activity tolerance             Functional mobility during ADLs: Supervision/safety (Pt performed mobility without any DME) General ADL Comments: Pt demonstrates progress and improved functional performance compared to prior session. Pt performed ADLs and functional mobility with supervision-Min guard A. Pt's daughter assited as needed and confirmed that she will be present at dc and they have all needed DME/AE     Vision   Vision Assessment?: No apparent visual deficits   Perception     Praxis      Cognition Arousal/Alertness: Awake/alert Behavior During Therapy: WFL for tasks assessed/performed Overall Cognitive Status: Within Functional Limits for tasks assessed                                          Exercises     Shoulder Instructions  General Comments Pt states "I'm ready to rock and roll" Daughter present for second half of session    Pertinent Vitals/ Pain       Pain Assessment: No/denies pain  Home Living                                          Prior Functioning/Environment              Frequency  Min 2X/week        Progress Toward Goals  OT Goals(current goals can now be found in the care plan section)  Progress towards OT goals: Progressing toward goals  Acute Rehab  OT Goals Patient Stated Goal: None stated OT Goal Formulation: With patient Time For Goal Achievement: 03/03/17 Potential to Achieve Goals: Good ADL Goals Pt Will Perform Grooming: with supervision;standing Pt Will Perform Upper Body Dressing: with supervision;sitting Pt Will Perform Lower Body Dressing: with min guard assist;sit to/from stand Pt Will Transfer to Toilet: ambulating;with supervision;bedside commode (BSC over toilet) Pt Will Perform Toileting - Clothing Manipulation and hygiene: with supervision;sit to/from stand Pt Will Perform Tub/Shower Transfer: with min assist;Tub transfer;ambulating;shower seat;3 in 1;rolling walker Pt/caregiver will Perform Home Exercise Program: Increased strength;Both right and left upper extremity;With written HEP provided;With Supervision  Plan Discharge plan needs to be updated    Co-evaluation                 AM-PAC PT "6 Clicks" Daily Activity     Outcome Measure   Help from another person eating meals?: None Help from another person taking care of personal grooming?: A Little Help from another person toileting, which includes using toliet, bedpan, or urinal?: A Little Help from another person bathing (including washing, rinsing, drying)?: A Little Help from another person to put on and taking off regular upper body clothing?: A Little Help from another person to put on and taking off regular lower body clothing?: A Little 6 Click Score: 19    End of Session    OT Visit Diagnosis: Muscle weakness (generalized) (M62.81);Unsteadiness on feet (R26.81)   Activity Tolerance Patient tolerated treatment well   Patient Left in chair;with call bell/phone within reach;with family/visitor present   Nurse Communication Mobility status        Time: 6629-4765 OT Time Calculation (min): 13 min  Charges: OT General Charges $OT Visit: 1 Procedure OT Treatments $Self Care/Home Management : 8-22 mins  [place],  OTR/L [place] 02/22/2017, 11:26 AM

## 2017-02-22 NOTE — Care Management Note (Signed)
Case Management Note  Patient Details  Name: Angela Trujillo MRN: [id number] Date of Birth: 12-26-1930  Subjective/Objective:             CM following for progression and d/c planning.        Action/Plan: 6/4/ met with pt and son re [facility] services. Pt has used [facility] previously and would like to use that agency again. This CM notified [facility] rep Judson Roch of pt and pt need for HHPT. No DME needs per pt and son.   Expected Discharge Date:  02/22/17               Expected Discharge Plan:  [place]  In-House Referral:  NA  Discharge planning Services  CM Consult  Post Acute Care Choice:  Home Health Choice offered to:  Patient  DME Arranged:  N/A DME Agency:  NA  HH Arranged:  NA HH Agency:  [facility] (Now [facility])  Status of Service:  Completed, signed off  If discussed at Long Length of Stay Meetings, dates discussed:    Additional Comments:  Adron Bene, RN 02/22/2017, 10:44 AM

## 2017-02-22 NOTE — Progress Notes (Signed)
Family Medicine Teaching Service Daily Progress Note Intern Pager: [phone]  Patient name: Angela Trujillo Medical record number: [id number] Date of birth: [birth year] Age: 83 y.o. Gender: female  Primary Care Provider: Glendon Axe, MD Consultants: CCM, nephrology Code Status: FULL   Pt Overview and Major Events to Date:  5/29 admitted with respiratory distress, likely HAP  Assessment and Plan: Angela Trujillo is a 83 y.o. female presenting with respiratory distress. PMH is significant for ESRD, T2DM, thrombocytopenia, S/P TAVR,  HTN, HFpEF (G2DD), PAF   Acute Hypoxic Respiratory Distress, Resolved:  Afebrile overnight, on room air.  Multifactorial from volume overload and HAP.  Weaned from BiPAP 5/30.  -Nephrology following, appreciate recs: likely volume overload as well as HAP, continue HD TTS -Blood cultures NGTD -Albuterol neb Q4H PRN -Has completed 7 day course of ABX.  Transaminatis, improved: AST 288>195>105, ALT 81 >114>93. Likely source due to infection vs medication. Amiodarone can cause transaminitis. INR 1.74 (on warfarin for PAF). Hep B surface antigen negative.  -monitor   ESRD: HD T/Th/Sat   - Nephrology following - Continue daily Phoslo - Cinacalcet with HD - Daily BMET   T2DM: currently at home on Toujeo 10 units.  Last A1c 09/2016 was 8.5%. Required 5U total/ 24 hours. Most recent CBG 158. - SSI and ACQHS CBG   HTN. At home on Toprol XL 100 mg daily, Clonidine 0.1 mg BID, and hydralazine 50 mg TID  - Continue Metoprolol tartrate 12.5mg  BID and Clonidine 0.1 mg BID - Holding hydralazine 50 mg TID   HFpEF: 10/2016 with an EF of 27-25%, grade 2 diastolic dysfunction, s/pt TAVR with bioprosthetic prosthesis, mild MV regurgitation, PA 50 mmHg. - continue metoprolol tartrate 12.5mg  BID   PAF: Home meds include amiodarone, metoprolol, Coumadin. INR goal 2-3.  - Continue home amiodarone, Coumadin  HLD.  At home on Lipitor 20 mg daily.  -Continue home  med  FEN/GI:  renal diet  Prophylaxis: coumadin   Disposition:  Medically ready for discharge; home today .   Subjective:  No acute events overnight. Feels well and is looking forward to going home today. No concerns  Objective: Temp:  [97.9 F (36.6 C)-98.9 F (37.2 C)] 97.9 F (36.6 C) (06/04 0417) Pulse Rate:  [68-72] 68 (06/04 0417) Resp:  [16-18] 18 (06/04 0417) BP: (125-158)/(49-52) 158/49 (06/04 0417) SpO2:  [95 %-100 %] 99 % (06/04 0417) Weight:  [60.8 kg (134 lb 0.6 oz)] 60.8 kg (134 lb 0.6 oz) (06/03 2033) Physical Exam: General: Thin elderly female resting in bed, NAD, A&O Cardiovascular: RRR, no murmur Respiratory: CTAB, normal WOB Abdomen: flat, Soft, nondistended, nontender, +BS  Extremities: no edema, warm Psych: mood stable, speech normal  Laboratory:  Recent Labs Lab 02/18/17 0725 02/19/17 0502 02/20/17 0546  WBC 11.0* 8.5 8.9  HGB 7.9* 8.3* 8.7*  HCT 23.5* 26.0* 26.8*  PLT 120* 99* 92*    Recent Labs Lab 02/17/17 1112  02/18/17 0700  02/19/17 0502 02/20/17 0546 02/21/17 0459  NA 135  < >  --   < > 132* 126* 131*  K 4.1  < >  --   < > 3.8 4.1 3.4*  CL 97*  < >  --   < > 95* 92* 93*  CO2 26  < >  --   < > 29 23 27   BUN 25*  < >  --   < > 14 29* 17  CREATININE 6.35*  < >  --   < > 4.95* 7.28* 4.31*  CALCIUM 7.5*  < >  --   < > 7.1* 6.7* 7.2*  PROT 6.1*  --  5.5*  --   --  6.0*  --   BILITOT 0.9  --  0.8  --   --  0.8  --   ALKPHOS 167*  --  133*  --   --  131*  --   ALT 108*  --  114*  --   --  93*  --   AST 205*  --  195*  --   --  105*  --   GLUCOSE 96  < >  --   < > 139* 164* 188*  < > = values in this interval not displayed.     Imaging/Diagnostic Tests: Dg Chest 2 View  Result Date: 02/16/2017 CLINICAL DATA:  Increased shortness of breath. EXAM: CHEST  2 VIEW COMPARISON:  10/22/2016. FINDINGS: Prior aortic valve repair. Cardiomegaly. Pulmonary vascular prominence. No pulmonary interstitial prominence. Interim clearing of  previously identified interstitial edema from chest x-ray of 10/22/2016. No pleural effusion or pneumothorax. Right axillary stents again noted. IMPRESSION: 1. Prior cardiac valve repair. Cardiomegaly with mild pulmonary vascular prominence. 2. No evidence of pulmonary edema. Interim clearing of previously identified pulmonary interstitial edema from chest x-ray of 10/22/2016. Electronically Signed   By: Marcello Moores  Register   On: 02/16/2017 13:54   Dg Lumbar Spine 2-3 Views  Result Date: 02/19/2017 CLINICAL DATA:  Acute bilateral lower back pain after fall from bed yesterday. EXAM: LUMBAR SPINE - 2-3 VIEW COMPARISON:  CT scan of August 31, 2016. FINDINGS: No fracture or spondylolisthesis is noted. Mild degenerative disc disease is noted at L4-5 and L5-S1. Mild dextroscoliosis of lower thoracic and upper lumbar spine is noted. IMPRESSION: Multilevel degenerative disc disease. No acute abnormality seen the lumbar spine. Electronically Signed   By: Marijo Conception, M.D.   On: 02/19/2017 16:35   Bufford Lope, DO 02/22/2017, 7:28 AM PGY-1, [place] Intern pager: [phone], text pages welcome

## 2017-02-22 NOTE — Progress Notes (Signed)
ANTICOAGULATION CONSULT  Pharmacy Consult:  Warfarin Indication: atrial fibrillation  Patient Measurements: Height: 5\' 5"  (165.1 cm) Weight: 134 lb 0.6 oz (60.8 kg) IBW/kg (Calculated) : 57  Vital Signs: Temp: 97.9 F (36.6 C) (06/04 0417) Temp Source: Oral (06/04 0417) BP: 158/49 (06/04 0417) Pulse Rate: 68 (06/04 0417)  Labs:  Recent Labs  02/20/17 0546 02/21/17 0459 02/22/17 0546  HGB 8.7*  --   --   HCT 26.8*  --   --   PLT 92*  --   --   LABPROT 25.1* 20.6* 21.7*  INR 2.23 1.74 1.86  CREATININE 7.28* 4.31*  --     Estimated Creatinine Clearance: 8.7 mL/min (A) (by C-G formula based on SCr of 4.31 mg/dL (H)).   Assessment: 83 yo F presents on 02/16/17 with SOB. Hx of ESRD, thrombocytopenia and Afib on Coumadin 5mg  daily PTA. Pharmacy consulted to dose warfarin. Anticoagulation clinic visit notes show labile INRs with multiple dose changes. Most recent clinic visit on 5/21 shows INR of 1.4 and her last dose PTA was on 02/16/17 and INR was supra-therapeutic on admit. PTA dose: 5mg /day (dose since 5/21)  INR is subtherapeutic today at 1.86 - she did miss a dose on 5/30 and had reduced dose given on 5/31. Patient started Levaquin on 6/1 (last day of course 6/5).  Patient's LFTs are improving, platelets still low. No bleeding reported.  Goal of Therapy:  INR 2-3 Monitor platelets by anticoagulation protocol: Yes     Plan:  Warfarin 6mg  po x1 tonight  Recommend resuming home dose (5mg  daily) when patient discharged with INR follow up on Thursday 6/7 if able Daily INR while here Monitor for s/sx of bleeding and drug interactions  Mannie Wineland D. Navia Lindahl, PharmD, BCPS Clinical Pharmacist Pager: [phone] Clinical Phone for 02/22/2017 until 3:30pm: [phone] If after 3:30pm, please call main pharmacy at [phone] 02/22/2017 9:01 AM

## 2017-04-09 ENCOUNTER — Ambulatory Visit (INDEPENDENT_AMBULATORY_CARE_PROVIDER_SITE_OTHER): Payer: Medicare Other | Admitting: *Deleted

## 2017-04-09 DIAGNOSIS — Z5181 Encounter for therapeutic drug level monitoring: Secondary | ICD-10-CM | POA: Diagnosis not present

## 2017-04-09 DIAGNOSIS — I4891 Unspecified atrial fibrillation: Secondary | ICD-10-CM

## 2017-04-09 LAB — POCT INR: INR: 2.4

## 2017-04-23 ENCOUNTER — Other Ambulatory Visit: Payer: Medicare Other | Admitting: *Deleted

## 2017-04-23 ENCOUNTER — Ambulatory Visit (INDEPENDENT_AMBULATORY_CARE_PROVIDER_SITE_OTHER): Payer: Medicare Other | Admitting: *Deleted

## 2017-04-23 DIAGNOSIS — Z5181 Encounter for therapeutic drug level monitoring: Secondary | ICD-10-CM | POA: Diagnosis not present

## 2017-04-23 DIAGNOSIS — I4891 Unspecified atrial fibrillation: Secondary | ICD-10-CM

## 2017-04-23 DIAGNOSIS — Z952 Presence of prosthetic heart valve: Secondary | ICD-10-CM

## 2017-04-23 DIAGNOSIS — I10 Essential (primary) hypertension: Secondary | ICD-10-CM

## 2017-04-23 DIAGNOSIS — I350 Nonrheumatic aortic (valve) stenosis: Secondary | ICD-10-CM

## 2017-04-23 LAB — COMPREHENSIVE METABOLIC PANEL
A/G RATIO: 1.4 (ref 1.2–2.2)
ALK PHOS: 84 IU/L (ref 39–117)
ALT: 12 IU/L (ref 0–32)
AST: 22 IU/L (ref 0–40)
Albumin: 3.4 g/dL — ABNORMAL LOW (ref 3.5–4.7)
BILIRUBIN TOTAL: 0.4 mg/dL (ref 0.0–1.2)
BUN/Creatinine Ratio: 4 — ABNORMAL LOW (ref 12–28)
BUN: 22 mg/dL (ref 8–27)
CHLORIDE: 101 mmol/L (ref 96–106)
CO2: 25 mmol/L (ref 20–29)
Calcium: 7.8 mg/dL — ABNORMAL LOW (ref 8.7–10.3)
Creatinine, Ser: 5.73 mg/dL — ABNORMAL HIGH (ref 0.57–1.00)
GFR calc Af Amer: 7 mL/min/{1.73_m2} — ABNORMAL LOW (ref >59)
GFR calc non Af Amer: 6 mL/min/{1.73_m2} — ABNORMAL LOW (ref >59)
GLOBULIN, TOTAL: 2.4 g/dL (ref 1.5–4.5)
Glucose: 145 mg/dL — ABNORMAL HIGH (ref 65–99)
POTASSIUM: 4.1 mmol/L (ref 3.5–5.2)
SODIUM: 142 mmol/L (ref 134–144)
Total Protein: 5.8 g/dL — ABNORMAL LOW (ref 6.0–8.5)

## 2017-04-23 LAB — POCT INR: INR: 1.5

## 2017-04-23 LAB — TSH: TSH: 1.33 u[IU]/mL (ref 0.450–4.500)

## 2017-04-23 LAB — T4, FREE: Free T4: 1.85 ng/dL — ABNORMAL HIGH (ref 0.82–1.77)

## 2017-05-03 ENCOUNTER — Ambulatory Visit (INDEPENDENT_AMBULATORY_CARE_PROVIDER_SITE_OTHER): Payer: Medicare Other | Admitting: *Deleted

## 2017-05-03 DIAGNOSIS — Z5181 Encounter for therapeutic drug level monitoring: Secondary | ICD-10-CM

## 2017-05-03 DIAGNOSIS — I4891 Unspecified atrial fibrillation: Secondary | ICD-10-CM | POA: Diagnosis not present

## 2017-05-03 LAB — POCT INR: INR: 2.1

## 2017-05-13 ENCOUNTER — Other Ambulatory Visit: Payer: Self-pay | Admitting: Physician Assistant

## 2017-05-17 ENCOUNTER — Ambulatory Visit (INDEPENDENT_AMBULATORY_CARE_PROVIDER_SITE_OTHER): Payer: Medicare Other | Admitting: *Deleted

## 2017-05-17 DIAGNOSIS — Z5181 Encounter for therapeutic drug level monitoring: Secondary | ICD-10-CM

## 2017-05-17 DIAGNOSIS — I4891 Unspecified atrial fibrillation: Secondary | ICD-10-CM

## 2017-05-17 LAB — POCT INR: INR: 2.6

## 2017-06-09 ENCOUNTER — Ambulatory Visit (INDEPENDENT_AMBULATORY_CARE_PROVIDER_SITE_OTHER): Payer: Medicare Other | Admitting: *Deleted

## 2017-06-09 ENCOUNTER — Ambulatory Visit (INDEPENDENT_AMBULATORY_CARE_PROVIDER_SITE_OTHER): Payer: Medicare Other | Admitting: Cardiovascular Disease

## 2017-06-09 ENCOUNTER — Encounter: Payer: Self-pay | Admitting: Cardiovascular Disease

## 2017-06-09 VITALS — BP 140/82 | HR 62 | Ht 65.0 in | Wt 145.1 lb

## 2017-06-09 DIAGNOSIS — I350 Nonrheumatic aortic (valve) stenosis: Secondary | ICD-10-CM | POA: Diagnosis not present

## 2017-06-09 DIAGNOSIS — I480 Paroxysmal atrial fibrillation: Secondary | ICD-10-CM

## 2017-06-09 DIAGNOSIS — Z5181 Encounter for therapeutic drug level monitoring: Secondary | ICD-10-CM | POA: Diagnosis not present

## 2017-06-09 DIAGNOSIS — Z952 Presence of prosthetic heart valve: Secondary | ICD-10-CM

## 2017-06-09 DIAGNOSIS — I4891 Unspecified atrial fibrillation: Secondary | ICD-10-CM

## 2017-06-09 LAB — POCT INR: INR: 1.6

## 2017-06-09 NOTE — Progress Notes (Signed)
Cardiology Office Note Date:  06/09/2017   ID:  Angela Trujillo, DOB [birth year], MRN [id number]  PCP:  Glendon Axe, MD  Cardiologist:  Sherren Mocha, MD    Chief Complaint  Patient presents with  . Shortness of Breath     History of Present Illness: Angela Trujillo is a 83 y.o. female who presents for follow-up of aortic valve disease. The patient underwent TAVR 09/29/2016 via a percutaneous transfemoral approach. Her immediate postoperative course was uncomplicated. She ultimately developed paroxysmal atrial fibrillation with RVR and was started on amiodarone. She is now anticoagulated with warfarin. She's here today with her 2 sons.  The patient complains of shortness of breath with activity. She leads a very sedentary lifestyle. She has end-stage renal disease with intermittent hemodialysis. She denies orthopnea, PND, or leg swelling. She denies any problems with fluid removal during dialysis. She reports no hypotensive events.   Past Medical History:  Diagnosis Date  . Atrial fibrillation with RVR ([place]) 10/22/2016  . Atrial flutter ([place])   . Diabetes mellitus with end stage renal disease ([place])   . Dyspnea    with exertion  . ESRD (end stage renal disease) on dialysis ([place])    HD on T,T, Sa  . GERD (gastroesophageal reflux disease)   . Hypertension   . Pneumonia 10/2015  . Severe aortic stenosis 08/19/2016   s/p TAVR 09/2016 // b. Echo 09/30/16: EF 50-55, inf-septal AK, Gr 2 DD, normally functioning TAVR, mean AV gradient 15 mmHg, mod MAC, mild MS, mild MR, trivial effusion    Past Surgical History:  Procedure Laterality Date  . ABDOMINAL HYSTERECTOMY    . CARDIAC CATHETERIZATION N/A 08/19/2016   Procedure: Right/Left Heart Cath and Coronary Angiography;  Surgeon: Sherren Mocha, MD;  Location: [facility];  Service: Cardiovascular;  Laterality: N/A;  . COLONOSCOPY    . IR GENERIC HISTORICAL  08/31/2016   IR RADIOLOGY PERIPHERAL GUIDED IV START 08/31/2016 Corrie Mckusick, DO [facility]  . IR GENERIC HISTORICAL  08/31/2016   IR US GUIDE VASC ACCESS RIGHT 08/31/2016 Corrie Mckusick, DO [facility]  . MULTIPLE EXTRACTIONS WITH ALVEOLOPLASTY N/A 09/25/2016   Procedure: EXTRACTION of tooth number 31 WITH ALVEOLOPLASTY AND GROSS DEBRIDEMENT OF REMAINING TEETH;  Surgeon: Lenn Cal, DDS;  Location: [place] [facility];  Service: Oral Surgery;  Laterality: N/A;  . TEE WITHOUT CARDIOVERSION N/A 09/29/2016   Procedure: TRANSESOPHAGEAL ECHOCARDIOGRAM (TEE);  Surgeon: Sherren Mocha, MD;  Location: [facility];  Service: Open Heart Surgery;  Laterality: N/A;  . TRANSCATHETER AORTIC VALVE REPLACEMENT, TRANSFEMORAL N/A 09/29/2016   Procedure: TRANSCATHETER AORTIC VALVE REPLACEMENT, TRANSFEMORAL;  Surgeon: Sherren Mocha, MD;  Location: [place];  Service: Open Heart Surgery;  Laterality: N/A;  . TUBAL LIGATION      Current Outpatient Prescriptions  Medication Sig Dispense Refill  . amiodarone (PACERONE) 200 MG tablet Take 1 tablet (200 mg total) by mouth daily. 30 tablet 11  . aspirin EC 81 MG EC tablet Take 1 tablet (81 mg total) by mouth daily.    . calcium acetate (PHOSLO) 667 MG capsule Take 667 mg by mouth daily with breakfast.    . cloNIDine (CATAPRES) 0.1 MG tablet TAKE 1 TABLET BY MOUTH TWICE DAILY 60 tablet 8  . ferrous sulfate 325 (65 FE) MG tablet Take 325 mg by mouth 3 (three) times daily with meals.     . Insulin Glargine (TOUJEO SOLOSTAR [place]) Inject 10 Units into the skin at bedtime.    . metoprolol succinate (TOPROL-XL)  100 MG 24 hr tablet Take 100 mg by mouth daily.    . multivitamin (RENA-VIT) TABS tablet Take 1 tablet by mouth at bedtime.  0  . SENSIPAR 30 MG tablet Take 30 mg by mouth Every Tuesday,Thursday,and Saturday with dialysis.     Marland Kitchen warfarin (COUMADIN) 5 MG tablet TAKE AS DIRECTED BY THE COUMADIN CLINIC( 1 MONTH SUPPLY) (Patient taking differently: Take 5 mg by mouth daily at 6 PM. TAKE AS DIRECTED BY THE COUMADIN CLINIC( 1 MONTH SUPPLY)) 40 tablet 2   No  current facility-administered medications for this visit.     Allergies:   Asa [aspirin]; Codeine; and Penicillins   Social History:  The patient  reports that she has never smoked. She has never used smokeless tobacco. She reports that she does not drink alcohol or use drugs.   Family History:  The patient's  family history is not on file.  ROS:  Please see the history of present illness.  Otherwise, review of systems is positive for fatigue.  All other systems are reviewed and negative.   PHYSICAL EXAM: VS:  BP 140/82   Pulse 62   Ht _0  (1.651 m)   Wt 65.8 kg (145 lb 1.9 oz)   SpO2 97%   BMI 24.15 kg/m  , BMI Body mass index is 24.15 kg/m. GEN: pleasant elderly woman, in no acute distress  HEENT: normal  Neck: no JVD, no masses.  Cardiac: RRR with 2/6 early peaking systolic murmur at the RUSB               Respiratory:  clear to auscultation bilaterally, normal work of breathing GI: soft, nontender, nondistended, + BS MS: no deformity or atrophy  Ext: trace bilateral pretibial edema Skin: warm and dry, no rash Neuro:  Strength and sensation are intact Psych: euthymic mood, full affect  EKG:  EKG is not ordered today.  Recent Labs: 02/17/2017: Magnesium 2.1 02/20/2017: Hemoglobin 8.7; Platelets 92 04/23/2017: ALT 12; BUN 22; Creatinine, Ser 5.73; Potassium 4.1; Sodium 142; TSH 1.330   Lipid Panel     Component Value Date/Time   CHOL 181 04/15/2013 1120   TRIG 91 04/15/2013 1120   HDL 62 04/15/2013 1120   CHOLHDL 2.9 04/15/2013 1120   VLDL 18 04/15/2013 1120   LDLCALC 101 (H) 04/15/2013 1120      Wt Readings from Last 3 Encounters:  06/09/17 65.8 kg (145 lb 1.9 oz)  02/21/17 60.8 kg (134 lb 0.6 oz)  02/08/17 67 kg (147 lb 12.8 oz)     Cardiac Studies Reviewed: 2D Echo 11/11/2016: Study Conclusions  - Left ventricle: The cavity size was normal. There was mild   concentric hypertrophy. Systolic function was normal. The   estimated ejection fraction was in the  range of 55% to 60%. Wall   motion was normal; there were no regional wall motion   abnormalities. Features are consistent with a pseudonormal left   ventricular filling pattern, with concomitant abnormal relaxation   and increased filling pressure (grade 2 diastolic dysfunction). - Aortic valve: A TAVR stent-valve bioprosthesis was present and   functioning normally. - Mitral valve: Calcified annulus. Mildly thickened leaflets .   There was mild regurgitation. - Left atrium: The atrium was moderately dilated. - Right ventricle: The cavity size was moderately dilated. Systolic   function was mildly reduced. - Right atrium: The atrium was moderately dilated. - Tricuspid valve: There was mild-moderate regurgitation directed   centrally. - Pulmonary arteries: Systolic pressure was moderately  increased.   PA peak pressure: 50 mm Hg (S).  ASSESSMENT AND PLAN: 1.  Paroxysmal atrial fibrillation. Continue warfarin for anticoagulation. Maintaining sinus rhythm on amiodarone. Recent labs reviewed. Will repeat LFTs and TSH in 6 months for amiodarone monitoring. Advised to discontinue aspirin.  2. Aortic valve disease status post TAVR: Exam is stable. Most recent echo reviewed as above. She has normal function of her bioprosthetic aortic valve.  3. End-stage renal disease: Appears to be tolerating hemodialysis based on her report.  4. Hypertension: Treated with clonidine and metoprolol succinate.  Current medicines are reviewed with the patient today.  The patient does not have concerns regarding medicines.  Labs/ tests ordered today include:   Orders Placed This Encounter  Procedures  . TSH  . T4, free  . Hepatic function panel   Disposition:   FU 6 months. Stop Aspirin  Signed, Sherren Mocha, MD  06/09/2017 4:58 PM    [facility] [address], [place], [place]  [address] Phone: [phone]; Fax: [phone]

## 2017-06-09 NOTE — Patient Instructions (Signed)
Medication Instructions:  Your provider recommends that you continue on your current medications as directed. Please refer to the Current Medication list given to you today.    Labwork: None (you will have labs drawn at your next appointment in 6 months)  Testing/Procedures: None  Follow-Up: Your provider wants you to follow-up in: 6 months with Dr. Burt Knack. You will receive a reminder letter in the mail two months in advance. If you don't receive a letter, please call our office to schedule the follow-up appointment.    Any Other Special Instructions Will Be Listed Below (If Applicable).     If you need a refill on your cardiac medications before your next appointment, please call your pharmacy.

## 2017-06-24 ENCOUNTER — Encounter (HOSPITAL_COMMUNITY)
Admission: EM | Disposition: A | Discharge status: Home Health | Payer: Self-pay | Source: Home / Self Care | Attending: Family Medicine

## 2017-06-24 ENCOUNTER — Inpatient Hospital Stay (HOSPITAL_COMMUNITY): Payer: Medicare Other | Admitting: Certified Registered Nurse Anesthetist

## 2017-06-24 ENCOUNTER — Encounter (HOSPITAL_COMMUNITY): Payer: Self-pay | Admitting: Emergency Medicine

## 2017-06-24 ENCOUNTER — Inpatient Hospital Stay (HOSPITAL_COMMUNITY)
Admission: EM | Admit: 2017-06-24 | Discharge: 2017-06-26 | DRG: 377 | Disposition: A | Discharge status: Home Health | Payer: Medicare Other | Attending: Family Medicine | Admitting: Family Medicine

## 2017-06-24 DIAGNOSIS — D6959 Other secondary thrombocytopenia: Secondary | ICD-10-CM | POA: Diagnosis present

## 2017-06-24 DIAGNOSIS — D123 Benign neoplasm of transverse colon: Secondary | ICD-10-CM

## 2017-06-24 DIAGNOSIS — Z9851 Tubal ligation status: Secondary | ICD-10-CM

## 2017-06-24 DIAGNOSIS — Z79899 Other long term (current) drug therapy: Secondary | ICD-10-CM | POA: Diagnosis not present

## 2017-06-24 DIAGNOSIS — Z7982 Long term (current) use of aspirin: Secondary | ICD-10-CM

## 2017-06-24 DIAGNOSIS — Z992 Dependence on renal dialysis: Secondary | ICD-10-CM

## 2017-06-24 DIAGNOSIS — K921 Melena: Secondary | ICD-10-CM

## 2017-06-24 DIAGNOSIS — K2950 Unspecified chronic gastritis without bleeding: Secondary | ICD-10-CM | POA: Diagnosis not present

## 2017-06-24 DIAGNOSIS — Z952 Presence of prosthetic heart valve: Secondary | ICD-10-CM

## 2017-06-24 DIAGNOSIS — I482 Chronic atrial fibrillation, unspecified: Secondary | ICD-10-CM | POA: Diagnosis present

## 2017-06-24 DIAGNOSIS — Z880 Allergy status to penicillin: Secondary | ICD-10-CM | POA: Diagnosis not present

## 2017-06-24 DIAGNOSIS — E1122 Type 2 diabetes mellitus with diabetic chronic kidney disease: Secondary | ICD-10-CM | POA: Diagnosis present

## 2017-06-24 DIAGNOSIS — D631 Anemia in chronic kidney disease: Secondary | ICD-10-CM | POA: Diagnosis present

## 2017-06-24 DIAGNOSIS — K219 Gastro-esophageal reflux disease without esophagitis: Secondary | ICD-10-CM | POA: Diagnosis present

## 2017-06-24 DIAGNOSIS — Z886 Allergy status to analgesic agent status: Secondary | ICD-10-CM | POA: Diagnosis not present

## 2017-06-24 DIAGNOSIS — T45515A Adverse effect of anticoagulants, initial encounter: Secondary | ICD-10-CM | POA: Diagnosis present

## 2017-06-24 DIAGNOSIS — K5731 Diverticulosis of large intestine without perforation or abscess with bleeding: Principal | ICD-10-CM | POA: Diagnosis present

## 2017-06-24 DIAGNOSIS — E1129 Type 2 diabetes mellitus with other diabetic kidney complication: Secondary | ICD-10-CM | POA: Diagnosis present

## 2017-06-24 DIAGNOSIS — Z794 Long term (current) use of insulin: Secondary | ICD-10-CM | POA: Diagnosis not present

## 2017-06-24 DIAGNOSIS — D696 Thrombocytopenia, unspecified: Secondary | ICD-10-CM | POA: Diagnosis not present

## 2017-06-24 DIAGNOSIS — Z90710 Acquired absence of both cervix and uterus: Secondary | ICD-10-CM | POA: Diagnosis not present

## 2017-06-24 DIAGNOSIS — E8889 Other specified metabolic disorders: Secondary | ICD-10-CM | POA: Diagnosis present

## 2017-06-24 DIAGNOSIS — R001 Bradycardia, unspecified: Secondary | ICD-10-CM | POA: Diagnosis present

## 2017-06-24 DIAGNOSIS — R791 Abnormal coagulation profile: Secondary | ICD-10-CM | POA: Diagnosis present

## 2017-06-24 DIAGNOSIS — Z885 Allergy status to narcotic agent status: Secondary | ICD-10-CM | POA: Diagnosis not present

## 2017-06-24 DIAGNOSIS — I120 Hypertensive chronic kidney disease with stage 5 chronic kidney disease or end stage renal disease: Secondary | ICD-10-CM | POA: Diagnosis present

## 2017-06-24 DIAGNOSIS — K3189 Other diseases of stomach and duodenum: Secondary | ICD-10-CM | POA: Diagnosis not present

## 2017-06-24 DIAGNOSIS — Z8701 Personal history of pneumonia (recurrent): Secondary | ICD-10-CM | POA: Diagnosis not present

## 2017-06-24 DIAGNOSIS — N186 End stage renal disease: Secondary | ICD-10-CM

## 2017-06-24 DIAGNOSIS — K7460 Unspecified cirrhosis of liver: Secondary | ICD-10-CM | POA: Diagnosis present

## 2017-06-24 DIAGNOSIS — R932 Abnormal findings on diagnostic imaging of liver and biliary tract: Secondary | ICD-10-CM | POA: Diagnosis not present

## 2017-06-24 DIAGNOSIS — N189 Chronic kidney disease, unspecified: Secondary | ICD-10-CM | POA: Insufficient documentation

## 2017-06-24 DIAGNOSIS — A049 Bacterial intestinal infection, unspecified: Secondary | ICD-10-CM | POA: Diagnosis not present

## 2017-06-24 DIAGNOSIS — K922 Gastrointestinal hemorrhage, unspecified: Secondary | ICD-10-CM | POA: Diagnosis present

## 2017-06-24 DIAGNOSIS — I480 Paroxysmal atrial fibrillation: Secondary | ICD-10-CM | POA: Diagnosis present

## 2017-06-24 DIAGNOSIS — Z7901 Long term (current) use of anticoagulants: Secondary | ICD-10-CM | POA: Diagnosis not present

## 2017-06-24 DIAGNOSIS — Z9889 Other specified postprocedural states: Secondary | ICD-10-CM

## 2017-06-24 DIAGNOSIS — D62 Acute posthemorrhagic anemia: Secondary | ICD-10-CM | POA: Diagnosis present

## 2017-06-24 DIAGNOSIS — E119 Type 2 diabetes mellitus without complications: Secondary | ICD-10-CM

## 2017-06-24 DIAGNOSIS — E11649 Type 2 diabetes mellitus with hypoglycemia without coma: Secondary | ICD-10-CM | POA: Diagnosis present

## 2017-06-24 HISTORY — PX: ESOPHAGOGASTRODUODENOSCOPY: SHX5428

## 2017-06-24 LAB — POC OCCULT BLOOD, ED
FECAL OCCULT BLD: POSITIVE — AB
Fecal Occult Bld: NEGATIVE

## 2017-06-24 LAB — COMPREHENSIVE METABOLIC PANEL
ALT: 13 U/L — AB (ref 14–54)
AST: 21 U/L (ref 15–41)
Albumin: 2.6 g/dL — ABNORMAL LOW (ref 3.5–5.0)
Alkaline Phosphatase: 74 U/L (ref 38–126)
Anion gap: 11 (ref 5–15)
BILIRUBIN TOTAL: 0.7 mg/dL (ref 0.3–1.2)
BUN: 22 mg/dL — AB (ref 6–20)
CO2: 27 mmol/L (ref 22–32)
CREATININE: 6.57 mg/dL — AB (ref 0.44–1.00)
Calcium: 7.4 mg/dL — ABNORMAL LOW (ref 8.9–10.3)
Chloride: 99 mmol/L — ABNORMAL LOW (ref 101–111)
GFR calc Af Amer: 6 mL/min — ABNORMAL LOW (ref >60)
GFR, EST NON AFRICAN AMERICAN: 5 mL/min — AB (ref >60)
Glucose, Bld: 176 mg/dL — ABNORMAL HIGH (ref 65–99)
Potassium: 4.5 mmol/L (ref 3.5–5.1)
Sodium: 137 mmol/L (ref 135–145)
TOTAL PROTEIN: 5.2 g/dL — AB (ref 6.5–8.1)

## 2017-06-24 LAB — PROTIME-INR
INR: 3.29
INR: 1.23
INR: 1.14
INR: 1.03
PROTHROMBIN TIME: 15.4 s — AB (ref 11.4–15.2)
PROTHROMBIN TIME: 13.4 s (ref 11.4–15.2)
Prothrombin Time: 33.2 seconds — ABNORMAL HIGH (ref 11.4–15.2)
Prothrombin Time: 14.5 seconds (ref 11.4–15.2)

## 2017-06-24 LAB — BASIC METABOLIC PANEL
ANION GAP: 8 (ref 5–15)
BUN: 24 mg/dL — AB (ref 6–20)
CALCIUM: 6.9 mg/dL — AB (ref 8.9–10.3)
CO2: 24 mmol/L (ref 22–32)
Chloride: 104 mmol/L (ref 101–111)
Creatinine, Ser: 6.69 mg/dL — ABNORMAL HIGH (ref 0.44–1.00)
GFR calc Af Amer: 6 mL/min — ABNORMAL LOW (ref >60)
GFR, EST NON AFRICAN AMERICAN: 5 mL/min — AB (ref >60)
GLUCOSE: 116 mg/dL — AB (ref 65–99)
Potassium: 4.5 mmol/L (ref 3.5–5.1)
SODIUM: 136 mmol/L (ref 135–145)

## 2017-06-24 LAB — CBC
HCT: 24.2 % — ABNORMAL LOW (ref 36.0–46.0)
HCT: 21.7 % — ABNORMAL LOW (ref 36.0–46.0)
HCT: 22 % — ABNORMAL LOW (ref 36.0–46.0)
HEMATOCRIT: 21.9 % — AB (ref 36.0–46.0)
HEMOGLOBIN: 7 g/dL — AB (ref 12.0–15.0)
HEMOGLOBIN: 7.3 g/dL — AB (ref 12.0–15.0)
Hemoglobin: 8 g/dL — ABNORMAL LOW (ref 12.0–15.0)
Hemoglobin: 7.2 g/dL — ABNORMAL LOW (ref 12.0–15.0)
MCH: 29.7 pg (ref 26.0–34.0)
MCH: 29.3 pg (ref 26.0–34.0)
MCH: 29.8 pg (ref 26.0–34.0)
MCH: 29.8 pg (ref 26.0–34.0)
MCHC: 33.1 g/dL (ref 30.0–36.0)
MCHC: 32.3 g/dL (ref 30.0–36.0)
MCHC: 32.9 g/dL (ref 30.0–36.0)
MCHC: 33.2 g/dL (ref 30.0–36.0)
MCV: 90 fL (ref 78.0–100.0)
MCV: 90.5 fL (ref 78.0–100.0)
MCV: 90.8 fL (ref 78.0–100.0)
MCV: 89.8 fL (ref 78.0–100.0)
PLATELETS: 104 10*3/uL — AB (ref 150–400)
PLATELETS: 110 10*3/uL — AB (ref 150–400)
PLATELETS: 91 10*3/uL — AB (ref 150–400)
Platelets: 90 10*3/uL — ABNORMAL LOW (ref 150–400)
RBC: 2.69 MIL/uL — ABNORMAL LOW (ref 3.87–5.11)
RBC: 2.39 MIL/uL — AB (ref 3.87–5.11)
RBC: 2.42 MIL/uL — ABNORMAL LOW (ref 3.87–5.11)
RBC: 2.45 MIL/uL — ABNORMAL LOW (ref 3.87–5.11)
RDW: 15.5 % (ref 11.5–15.5)
RDW: 16.3 % — ABNORMAL HIGH (ref 11.5–15.5)
RDW: 16.5 % — AB (ref 11.5–15.5)
RDW: 16.6 % — AB (ref 11.5–15.5)
WBC: 7.6 10*3/uL (ref 4.0–10.5)
WBC: 8.9 10*3/uL (ref 4.0–10.5)
WBC: 8.9 10*3/uL (ref 4.0–10.5)
WBC: 9.2 10*3/uL (ref 4.0–10.5)

## 2017-06-24 LAB — APTT: APTT: 44 s — AB (ref 24–36)

## 2017-06-24 LAB — CBG MONITORING, ED
Glucose-Capillary: 112 mg/dL — ABNORMAL HIGH (ref 65–99)
Glucose-Capillary: 107 mg/dL — ABNORMAL HIGH (ref 65–99)

## 2017-06-24 LAB — GLUCOSE, CAPILLARY
GLUCOSE-CAPILLARY: 109 mg/dL — AB (ref 65–99)
GLUCOSE-CAPILLARY: 57 mg/dL — AB (ref 65–99)
Glucose-Capillary: 107 mg/dL — ABNORMAL HIGH (ref 65–99)
Glucose-Capillary: 90 mg/dL (ref 65–99)

## 2017-06-24 LAB — PREPARE RBC (CROSSMATCH)

## 2017-06-24 LAB — MRSA PCR SCREENING: MRSA BY PCR: NEGATIVE

## 2017-06-24 SURGERY — EGD (ESOPHAGOGASTRODUODENOSCOPY)
Anesthesia: Monitor Anesthesia Care

## 2017-06-24 MED ORDER — SODIUM CHLORIDE 0.9 % IV SOLN: 100.0000 mL | INTRAVENOUS | Status: DC | PRN

## 2017-06-24 MED ORDER — VITAMIN K1 10 MG/ML IJ SOLN
5.0000 mg | Freq: Once | INTRAMUSCULAR | Status: AC
  Administered 2017-06-24: 5 mg via INTRAVENOUS
  Filled 2017-06-24: qty 0.5

## 2017-06-24 MED ORDER — SODIUM CHLORIDE 0.9 % IV SOLN
80.0000 mg | Freq: Once | INTRAVENOUS | Status: AC
  Administered 2017-06-24: 80 mg via INTRAVENOUS
  Filled 2017-06-24: qty 80

## 2017-06-24 MED ORDER — RENA-VITE PO TABS
1.0000 | ORAL_TABLET | Freq: Every day | ORAL | Status: DC
  Administered 2017-06-24 – 2017-06-25 (×2): 1 via ORAL
  Filled 2017-06-24 (×3): qty 1

## 2017-06-24 MED ORDER — PROPOFOL 10 MG/ML IV BOLUS
INTRAVENOUS | Status: DC | PRN
  Administered 2017-06-24: 20 mg via INTRAVENOUS

## 2017-06-24 MED ORDER — PANTOPRAZOLE SODIUM 40 MG PO TBEC: 40.0000 mg | DELAYED_RELEASE_TABLET | Freq: Every day | ORAL | Status: DC

## 2017-06-24 MED ORDER — AMIODARONE HCL 200 MG PO TABS
200.0000 mg | ORAL_TABLET | Freq: Every day | ORAL | Status: DC
  Administered 2017-06-24 – 2017-06-26 (×3): 200 mg via ORAL
  Filled 2017-06-24 (×3): qty 1

## 2017-06-24 MED ORDER — SODIUM CHLORIDE 0.9 % IV SOLN
Freq: Once | INTRAVENOUS | Status: AC
  Administered 2017-06-25: 11:00:00 via INTRAVENOUS

## 2017-06-24 MED ORDER — PROTHROMBIN COMPLEX CONC HUMAN 500 UNITS IV KIT
1654.0000 [IU] | PACK | Status: AC
  Administered 2017-06-24: 1654 [IU] via INTRAVENOUS
  Filled 2017-06-24: qty 66

## 2017-06-24 MED ORDER — INSULIN ASPART 100 UNIT/ML ~~LOC~~ SOLN
0.0000 [IU] | Freq: Three times a day (TID) | SUBCUTANEOUS | Status: DC
  Administered 2017-06-25 (×2): 3 [IU] via SUBCUTANEOUS
  Administered 2017-06-26: 2 [IU] via SUBCUTANEOUS

## 2017-06-24 MED ORDER — SODIUM CHLORIDE 0.9 % IV SOLN
INTRAVENOUS | Status: DC
  Administered 2017-06-24: 12:00:00 via INTRAVENOUS
  Administered 2017-06-24: 1000 mL via INTRAVENOUS
  Administered 2017-06-24: 21:00:00 via INTRAVENOUS

## 2017-06-24 MED ORDER — ACETAMINOPHEN 650 MG RE SUPP: 650.0000 mg | Freq: Four times a day (QID) | RECTAL | Status: DC | PRN

## 2017-06-24 MED ORDER — HYDRALAZINE HCL 20 MG/ML IJ SOLN: 5.0000 mg | INTRAMUSCULAR | Status: DC | PRN

## 2017-06-24 MED ORDER — PROTHROMBIN COMPLEX CONC HUMAN 500 UNITS IV KIT
1597.0000 [IU] | PACK | Status: DC
  Filled 2017-06-24: qty 64

## 2017-06-24 MED ORDER — PEG-KCL-NACL-NASULF-NA ASC-C 100 G PO SOLR
0.5000 | ORAL | Status: AC
  Administered 2017-06-24: 100 g via ORAL
  Filled 2017-06-24: qty 1

## 2017-06-24 MED ORDER — CLONIDINE HCL 0.1 MG PO TABS
0.1000 mg | ORAL_TABLET | Freq: Two times a day (BID) | ORAL | Status: DC
  Administered 2017-06-24 – 2017-06-26 (×5): 0.1 mg via ORAL
  Filled 2017-06-24 (×6): qty 1

## 2017-06-24 MED ORDER — ACETAMINOPHEN 325 MG PO TABS: 650.0000 mg | ORAL_TABLET | Freq: Four times a day (QID) | ORAL | Status: DC | PRN

## 2017-06-24 MED ORDER — METOPROLOL SUCCINATE ER 100 MG PO TB24
100.0000 mg | ORAL_TABLET | Freq: Every day | ORAL | Status: DC
  Administered 2017-06-25 – 2017-06-26 (×2): 100 mg via ORAL
  Filled 2017-06-24 (×3): qty 1

## 2017-06-24 MED ORDER — LIDOCAINE HCL (PF) 1 % IJ SOLN: 5.0000 mL | INTRAMUSCULAR | Status: DC | PRN

## 2017-06-24 MED ORDER — SODIUM CHLORIDE 0.9 % IV SOLN
8.0000 mg/h | INTRAVENOUS | Status: DC
  Administered 2017-06-24: 8 mg/h via INTRAVENOUS
  Filled 2017-06-24 (×2): qty 80

## 2017-06-24 MED ORDER — PENTAFLUOROPROP-TETRAFLUOROETH EX AERO: 1.0000 "application " | INHALATION_SPRAY | CUTANEOUS | Status: DC | PRN

## 2017-06-24 MED ORDER — INSULIN GLARGINE 100 UNIT/ML ~~LOC~~ SOLN: 7.0000 [IU] | Freq: Every day | SUBCUTANEOUS | Status: DC

## 2017-06-24 MED ORDER — DOXERCALCIFEROL 4 MCG/2ML IV SOLN
INTRAVENOUS | Status: AC
  Administered 2017-06-24: 3 ug via INTRAVENOUS
  Filled 2017-06-24: qty 2

## 2017-06-24 MED ORDER — INSULIN GLARGINE 100 UNIT/ML ~~LOC~~ SOLN
5.0000 [IU] | Freq: Every day | SUBCUTANEOUS | Status: DC
  Filled 2017-06-24: qty 0.05

## 2017-06-24 MED ORDER — LIDOCAINE-PRILOCAINE 2.5-2.5 % EX CREA: 1.0000 "application " | TOPICAL_CREAM | CUTANEOUS | Status: DC | PRN

## 2017-06-24 MED ORDER — SODIUM CHLORIDE 0.9 % IV SOLN
Freq: Once | INTRAVENOUS | Status: AC
  Administered 2017-06-24: 12:00:00 via INTRAVENOUS

## 2017-06-24 MED ORDER — HYDROXYZINE HCL 10 MG PO TABS
10.0000 mg | ORAL_TABLET | Freq: Three times a day (TID) | ORAL | Status: DC | PRN
Start: 2017-06-24 — End: 2017-06-26
  Filled 2017-06-24: qty 1

## 2017-06-24 MED ORDER — PEG-KCL-NACL-NASULF-NA ASC-C 100 G PO SOLR
0.5000 | Freq: Once | ORAL | Status: AC
Start: 2017-06-25 — End: 2017-06-25
  Administered 2017-06-25: 100 g via ORAL

## 2017-06-24 MED ORDER — PEG-KCL-NACL-NASULF-NA ASC-C 100 G PO SOLR: 1.0000 | Freq: Once | ORAL | Status: DC

## 2017-06-24 MED ORDER — CINACALCET HCL 30 MG PO TABS
30.0000 mg | ORAL_TABLET | ORAL | Status: DC
  Filled 2017-06-24: qty 1

## 2017-06-24 MED ORDER — FERROUS SULFATE 325 (65 FE) MG PO TABS
325.0000 mg | ORAL_TABLET | Freq: Three times a day (TID) | ORAL | Status: DC
  Administered 2017-06-25 – 2017-06-26 (×3): 325 mg via ORAL
  Filled 2017-06-24 (×6): qty 1

## 2017-06-24 MED ORDER — CALCIUM ACETATE (PHOS BINDER) 667 MG PO CAPS
667.0000 mg | ORAL_CAPSULE | Freq: Every day | ORAL | Status: DC
  Filled 2017-06-24 (×2): qty 1

## 2017-06-24 MED ORDER — VITAMIN K1 10 MG/ML IJ SOLN
5.0000 mg | INTRAVENOUS | Status: AC
  Administered 2017-06-24: 5 mg via INTRAVENOUS
  Filled 2017-06-24: qty 0.5

## 2017-06-24 MED ORDER — PANTOPRAZOLE SODIUM 40 MG IV SOLR: 40.0000 mg | Freq: Two times a day (BID) | INTRAVENOUS | Status: DC

## 2017-06-24 MED ORDER — DEXTROSE-NACL 5-0.45 % IV SOLN
INTRAVENOUS | Status: DC
  Administered 2017-06-24: via INTRAVENOUS

## 2017-06-24 MED ORDER — ACETAMINOPHEN 325 MG PO TABS
ORAL_TABLET | ORAL | Status: AC
  Filled 2017-06-24: qty 2

## 2017-06-24 MED ORDER — PANTOPRAZOLE SODIUM 40 MG PO TBEC
40.0000 mg | DELAYED_RELEASE_TABLET | Freq: Every day | ORAL | Status: DC
  Administered 2017-06-25 – 2017-06-26 (×2): 40 mg via ORAL
  Filled 2017-06-24 (×2): qty 1

## 2017-06-24 MED ORDER — ZOLPIDEM TARTRATE 5 MG PO TABS: 5.0000 mg | ORAL_TABLET | Freq: Every evening | ORAL | Status: DC | PRN

## 2017-06-24 MED ORDER — DOXERCALCIFEROL 4 MCG/2ML IV SOLN
3.0000 ug | INTRAVENOUS | Status: DC
  Administered 2017-06-24 – 2017-06-26 (×2): 3 ug via INTRAVENOUS
  Filled 2017-06-24: qty 2

## 2017-06-24 MED ORDER — PROPOFOL 500 MG/50ML IV EMUL
INTRAVENOUS | Status: DC | PRN
  Administered 2017-06-24: 60 ug/kg/min via INTRAVENOUS

## 2017-06-24 NOTE — Progress Notes (Signed)
  PROGRESS NOTE  Angela Trujillo KKX:[id number] DOB: 1931-07-19 DOA: 06/24/2017 PCP: Glendon Axe, MD  Brief Narrative: 83 year old woman PMH atrial fibrillation on warfarin, status post TAPVR with bioprosthetic valve, diabetes mellitus, end-stage renal disease, presented with tarry stools and weakness.admitted for GI bleedof unclear etiology, cannot remember who performed the previous colonoscopy.because of ongoing bleeding, patient received vitamin K and then Kcentra.  Assessment/Plan GIB with ABLA complicated by supratherapeutic INR on warfarin for afib. -continue Protonix infusion, remain NPO, GI consult. -INR now normal s/o vitamin k and Kcentra -Trend hemoglobin, keep 2 units ahead. Transfuse 1 unit PRBC now.  PAF -currently in SR. Continue amiodarone  S/p bioprosthetic TAVR 09/2016  Chronic thrombocytopenia, likely secondary to blood loss. -Follow CBC  End-stage renal disease on hemodialysis Tuesday, Thursday, Saturday -due for HD today. K+ normal. Nephrology consulted.  Anemia of chronic kidney disease  Diabetes mellitus type 2 -stable. SSI, low dose Lantus (1/2 dose)    Given ongoing blood loss, upgrade to stepdown.  DVT prophylaxis: SCDs Code Status: full Family Communication: son at bedside Disposition Plan: home    Murray Hodgkins, MD  [facility] Direct contact: [phone] --[place] [place]  --[email or web address]; password [email or web address]  7PM-7AM contact night coverage as above 06/24/2017, 9:18 AM  LOS: 0 days   Consultants:  GI  Procedures:    Antimicrobials:    Interval history/Subjective: Feels ok, no pain, no SOB. No previous h/o bleeding  Objective: Vitals: afebrile, 98.7, 16, 60, 144/56  Exam:     Constitutional: appears calm, comfortable.  Eyes. Pupils, irises, lids appear unremarkable.  ENT. Grossly normal hearing, lips, tongue.  Respiratory. Clear to auscultation bilaterally. No rales or rhonchi. Normal respiratory  effort.  Cardiovascular. Regular rate and rhythm. No murmur, rub or gallop. No lower extremity edema.  Abdomen. Soft, nontender, nondistended.  Psychiatric. Grossly normal mood and affect. Speech fluent and appropriate.   I have personally reviewed the following:   Labs:  INR 3.29 >> 0.17  Basic metabolic panel unremarkable  Hemoglobintrending down, 7.2.  Platelets stable 104  Imaging studies:    Medical tests:  SR, RBBB, LAFB    Scheduled Meds: . amiodarone  200 mg Oral Daily  . calcium acetate  667 mg Oral Q breakfast  . cinacalcet  30 mg Oral Q T,Th,Sa-HD  . cloNIDine  0.1 mg Oral BID  . ferrous sulfate  325 mg Oral TID WC  . insulin aspart  0-9 Units Subcutaneous TID WC  . insulin glargine  5 Units Subcutaneous QHS  . metoprolol succinate  100 mg Oral Daily  . multivitamin  1 tablet Oral QHS  . [START ON 06/27/2017] pantoprazole  40 mg Intravenous Q12H   Continuous Infusions: . sodium chloride 1,000 mL (06/24/17 0607)  . sodium chloride    . pantoprozole (PROTONIX) infusion 8 mg/hr (06/24/17 5102)    Principal Problem:   GIB (gastrointestinal bleeding) Active Problems:   S/P TAVR (transcatheter aortic valve replacement)   Atrial fibrillation, chronic (HCC)   Thrombocytopenia (HCC)   ESRD on hemodialysis ([place])   Anemia due to end stage renal disease (HCC)   Type II diabetes mellitus with renal manifestations ([place])   Acute blood loss anemia   LOS: 0 days

## 2017-06-24 NOTE — ED Notes (Signed)
Cleaned up very large amt. Rectal blood with clots . Patient still denies pain. Family at bedside.

## 2017-06-24 NOTE — Consult Note (Signed)
Referring Provider: [facility]   Primary Care Physician:  Glendon Axe, MD Primary Gastroenterologist:  unassigned Reason for Consultation:  GI bleed   Attending physician's note   I have taken a history, examined the patient and reviewed the chart. I agree with the Advanced Practitioner's note, impression and recommendations.  Painless hematochezia in setting of Coumadin anticoagulation, likely diverticular bleed (numerous diverticulae throughout the colon on CT) however need to exclude UGI source especially given likely diagnosis of cirrhosis (typical features on CT and thrombocytopenia). INR is now corrected. Hb=7.2.  IV PPI. Trend CBC. EGD today.  If no source on EGD consider colonoscopy.  Abnormal pancreas on CT, distal body and tail pancreatic atrophy, possible pseudocysts or side branch ectasias, will need further elective evaluation with MRI/MRCP.    Angela Edward, MD Angela Trujillo [phone] Mon-Fri 8a-5p [phone] after 5p, weekends, holidays   ASSESSMENT AND PLAN:    59. 83 yo female with ESRD on HD presenting with painless GI bleeding and acute on chronic anemia. Hemodynamically stable. She reports initial blood as being very dark though it was mixed with stool and she takes iron. Suspect this is a lower GI bleed but CT scan in 2017 suggests cirrhosis so need to exclude variceal bleeding. Otherwise suspect diverticular hemorrhage -EGD today. The risks and benefits of EGD were discussed and the patient agrees to proceed.  -BID PPI for now -Monitor hgb and transfuse if needed.  -further recommendations to follow EGD today  2. Chronic coumadin, INR has been reversed and now at 1.23.   3. ? Chronic pancreatitis with possible pseudocysts in 2017.   4. Multiple medical problems not limited to DM, HTN, hx of TAVR, AFib,   5. Chronic thrombocytopenia, probable has cirrhosis based on imaging   HPI: Angela Trujillo is a 83 y.o. female with multiple medical problems not limited  to ESRD on HD, AFib, severe AS s/p TAVR and DM. She presented to ED with onset of GI bleeding yesterday evening while taking a bath. She describes the blood as very dark though she does take iron. No abdominal or rectal pain. No history of GI bleeding. She started Coumadin in January. Lives at home, son alternate staying with her. Appetite has been fine. No chest pain. She has occasional SOB but this is chronic. use. No hx of PUD. No NSAIDS except for baby asa.   Past Medical History:  Diagnosis Date  . Atrial fibrillation with RVR ([place]) 10/22/2016  . Atrial flutter ([place])   . Diabetes mellitus with end stage renal disease ([place])   . Dyspnea    with exertion  . ESRD (end stage renal disease) on dialysis ([place])    HD on T,T, Sa  . GERD (gastroesophageal reflux disease)   . Hypertension   . Pneumonia 10/2015  . Severe aortic stenosis 08/19/2016   s/p TAVR 09/2016 // b. Echo 09/30/16: EF 50-55, inf-septal AK, Gr 2 DD, normally functioning TAVR, mean AV gradient 15 mmHg, mod MAC, mild MS, mild MR, trivial effusion    Past Surgical History:  Procedure Laterality Date  . ABDOMINAL HYSTERECTOMY    . CARDIAC CATHETERIZATION N/A 08/19/2016   Procedure: Right/Left Heart Cath and Coronary Angiography;  Surgeon: Angela Mocha, MD;  Location: [facility];  Service: Cardiovascular;  Laterality: N/A;  . COLONOSCOPY    . IR GENERIC HISTORICAL  08/31/2016   IR RADIOLOGY PERIPHERAL GUIDED IV START 08/31/2016 Angela Mckusick, DO [facility]  . IR GENERIC HISTORICAL  08/31/2016   IR  US GUIDE VASC ACCESS RIGHT 08/31/2016 Angela Mckusick, DO [facility]  . MULTIPLE EXTRACTIONS WITH ALVEOLOPLASTY N/A 09/25/2016   Procedure: EXTRACTION of tooth number 31 WITH ALVEOLOPLASTY AND GROSS DEBRIDEMENT OF REMAINING TEETH;  Surgeon: Angela Trujillo, DDS;  Location: [facility];  Service: Oral Surgery;  Laterality: N/A;  . TEE WITHOUT CARDIOVERSION N/A 09/29/2016   Procedure: TRANSESOPHAGEAL ECHOCARDIOGRAM (TEE);  Surgeon:  Angela Mocha, MD;  Location: [facility];  Service: Open Heart Surgery;  Laterality: N/A;  . TRANSCATHETER AORTIC VALVE REPLACEMENT, TRANSFEMORAL N/A 09/29/2016   Procedure: TRANSCATHETER AORTIC VALVE REPLACEMENT, TRANSFEMORAL;  Surgeon: Angela Mocha, MD;  Location: [facility];  Service: Open Heart Surgery;  Laterality: N/A;  . TUBAL LIGATION      Prior to Admission medications   Medication Sig Start Date End Date Taking? Authorizing Provider  amiodarone (PACERONE) 200 MG tablet Take 1 tablet (200 mg total) by mouth daily. 11/27/16  Yes Angela Mocha, MD  calcium acetate (PHOSLO) 667 MG capsule Take 667 mg by mouth daily with breakfast.   Yes [provider]  cloNIDine (CATAPRES) 0.1 MG tablet TAKE 1 TABLET BY MOUTH TWICE DAILY 05/13/17  Yes Richardson Dopp T, PA-C  ferrous sulfate 325 (65 FE) MG tablet Take 325 mg by mouth 3 (three) times daily with meals.    Yes [provider]  Insulin Glargine (TOUJEO SOLOSTAR [place]) Inject 10 Units into the skin at bedtime.   Yes [provider]  metoprolol succinate (TOPROL-XL) 100 MG 24 hr tablet Take 100 mg by mouth daily. 06/29/16  Yes [provider]  multivitamin (RENA-VIT) TABS tablet Take 1 tablet by mouth at bedtime. 10/04/16  Yes Angela Shi, PA-C  SENSIPAR 30 MG tablet Take 30 mg by mouth Every Tuesday,Thursday,and Saturday with dialysis.  08/06/16  Yes [provider]  warfarin (COUMADIN) 5 MG tablet TAKE AS DIRECTED BY THE COUMADIN CLINIC( 1 MONTH SUPPLY) Patient taking differently: Take 5 mg by mouth daily.  01/13/17  Yes Angela Mocha, MD  aspirin EC 81 MG EC tablet Take 1 tablet (81 mg total) by mouth daily. Patient not taking: Reported on 06/24/2017 10/05/16   Richardson Dopp T, PA-C    Current Facility-Administered Medications  Medication Dose Route Frequency Provider Last Rate Last Dose  . 0.9 %  sodium chloride infusion   Intravenous Continuous Angela Costa, MD 50 mL/hr at 06/24/17 0607 1,000 mL at 06/24/17  0607  . 0.9 %  sodium chloride infusion   Intravenous Once Angela Costa, MD      . acetaminophen (TYLENOL) tablet 650 mg  650 mg Oral Q6H PRN Angela Costa, MD       Or  . acetaminophen (TYLENOL) suppository 650 mg  650 mg Rectal Q6H PRN Angela Costa, MD      . amiodarone (PACERONE) tablet 200 mg  200 mg Oral Daily Angela Costa, MD      . calcium acetate (PHOSLO) capsule 667 mg  667 mg Oral Q breakfast Angela Costa, MD      . cinacalcet (SENSIPAR) tablet 30 mg  30 mg Oral Q T,Th,Sa-HD Angela Costa, MD      . cloNIDine (CATAPRES) tablet 0.1 mg  0.1 mg Oral BID Angela Costa, MD      . ferrous sulfate tablet 325 mg  325 mg Oral TID WC Angela Costa, MD      . hydrALAZINE (APRESOLINE) injection 5 mg  5 mg Intravenous Q2H PRN Angela Costa, MD      . hydrOXYzine (ATARAX/VISTARIL) tablet  10 mg  10 mg Oral TID PRN Angela Costa, MD      . insulin aspart (novoLOG) injection 0-9 Units  0-9 Units Subcutaneous TID WC Angela Costa, MD      . insulin glargine (LANTUS) injection 5 Units  5 Units Subcutaneous QHS Samuella Cota, MD      . metoprolol succinate (TOPROL-XL) 24 hr tablet 100 mg  100 mg Oral Daily Angela Costa, MD      . multivitamin (RENA-VIT) tablet 1 tablet  1 tablet Oral QHS Angela Costa, MD      . pantoprazole (PROTONIX) 80 mg in sodium chloride 0.9 % 250 mL (0.32 mg/mL) infusion  8 mg/hr Intravenous Continuous Angela Costa, MD 25 mL/hr at 06/24/17 3299 8 mg/hr at 06/24/17 0608  . [START ON 06/27/2017] pantoprazole (PROTONIX) injection 40 mg  40 mg Intravenous Q12H Angela Costa, MD      . zolpidem (AMBIEN) tablet 5 mg  5 mg Oral QHS PRN Angela Costa, MD       Current Outpatient Prescriptions  Medication Sig Dispense Refill  . amiodarone (PACERONE) 200 MG tablet Take 1 tablet (200 mg total) by mouth daily. 30 tablet 11  . calcium acetate (PHOSLO) 667 MG capsule Take 667 mg by mouth daily with breakfast.    . cloNIDine (CATAPRES) 0.1 MG tablet TAKE 1 TABLET BY MOUTH TWICE DAILY 60 tablet 8  . ferrous sulfate 325 (65 FE) MG  tablet Take 325 mg by mouth 3 (three) times daily with meals.     . Insulin Glargine (TOUJEO SOLOSTAR [place]) Inject 10 Units into the skin at bedtime.    . metoprolol succinate (TOPROL-XL) 100 MG 24 hr tablet Take 100 mg by mouth daily.    . multivitamin (RENA-VIT) TABS tablet Take 1 tablet by mouth at bedtime.  0  . SENSIPAR 30 MG tablet Take 30 mg by mouth Every Tuesday,Thursday,and Saturday with dialysis.     Marland Kitchen warfarin (COUMADIN) 5 MG tablet TAKE AS DIRECTED BY THE COUMADIN CLINIC( 1 MONTH SUPPLY) (Patient taking differently: Take 5 mg by mouth daily. ) 40 tablet 2  . aspirin EC 81 MG EC tablet Take 1 tablet (81 mg total) by mouth daily. (Patient not taking: Reported on 06/24/2017)      Allergies as of 06/24/2017 - Review Complete 06/24/2017  Allergen Reaction Noted  . Asa [aspirin] Nausea Only and Other (See Comments) 04/14/2013  . Codeine Rash 04/14/2013  . Penicillins Rash 04/14/2013    Family History  Problem Relation Age of Onset  . CAD Neg Hx     Social History   Social History  . Marital status: Widowed    Spouse name: N/A  . Number of children: 5  . Years of education: N/A   Occupational History  . Not on file.   Social History Main Topics  . Smoking status: Never Smoker  . Smokeless tobacco: Never Used  . Alcohol use No  . Drug use: No  . Sexual activity: No   Other Topics Concern  . Not on file   Social History Narrative  . No narrative on file    Review of Systems: All systems reviewed and negative except where noted in HPI.  Physical Exam: Vital signs in last 24 hours: Temp:  [98.7 F (37.1 C)] 98.7 F (37.1 C) (10/04 0021) Pulse Rate:  [57-64] 61 (10/04 0845) Resp:  [11-16] 14 (10/04 0845) BP: (130-163)/(49-62) 144/56 (10/04 0845) SpO2:  [97 %-100 %] 100 % (10/04 0845) Weight:  [  140 lb (63.5 kg)] 140 lb (63.5 kg) (10/04 0026)   General:   Alert, well-developed, elderly black female in NAD Psych:  Pleasant, cooperative. Normal mood and  affect. Eyes:  Pupils equal, sclera clear, no icterus.   Conjunctiva pale Ears:  Normal auditory acuity. Nose:  No deformity, discharge,  or lesions. Neck:  Supple; no masses Lungs:  Clear throughout to auscultation.   No wheezes, crackles, or rhonchi.  Heart:  Regular rate and rhythm; no murmurs, no edema Abdomen:  Soft, non-distended, nontender, BS active, no palp mass    Rectal:  Stool with red blood mixed in on DRE Msk:  Symmetrical without gross deformities. . Pulses:  Normal pulses noted. Neurologic:  Alert and  oriented x4;  grossly normal neurologically. Skin:  Intact without significant lesions or rashes..   Intake/Output from previous day: No intake/output data recorded. Intake/Output this shift: No intake/output data recorded.  Lab Results:  Recent Labs  06/24/17 0026 06/24/17 0513  WBC 8.9 8.9  HGB 8.0* 7.2*  HCT 24.2* 21.9*  PLT 110* 104*   BMET  Recent Labs  06/24/17 0026 06/24/17 0804  NA 137 136  K 4.5 4.5  CL 99* 104  CO2 27 24  GLUCOSE 176* 116*  BUN 22* 24*  CREATININE 6.57* 6.69*  CALCIUM 7.4* 6.9*   LFT  Recent Labs  06/24/17 0026  PROT 5.2*  ALBUMIN 2.6*  AST 21  ALT 13*  ALKPHOS 74  BILITOT 0.7   PT/INR  Recent Labs  06/24/17 0026 06/24/17 0804  LABPROT 33.2* 15.4*  INR 3.29 1.23     Studies/Results: Afib, on chronic coumadin. INR 3.29, down to 1.23 after Vitamin K.  Chronic thrombocytopenia Tye Savoy, NP-C @  06/24/2017, 9:15 AM  Pager number [phone]

## 2017-06-24 NOTE — ED Notes (Addendum)
PT arrives to floor at this time. Blood transfusion finished during procedure.

## 2017-06-24 NOTE — H&P (Addendum)
History and Physical    Angela Trujillo GMW:[id number] DOB: Mar 18, 1931 DOA: 06/24/2017  Referring MD/NP/PA:   PCP: Glendon Axe, MD   Patient coming from:  The patient is coming from home.  At baseline, pt is partially dependent for most of ADL.   Chief Complaint: tarry stool and weakness  HPI: [name] is a 83 y.o. female with medical history significant of s/p of TAVR with bioprothesis valve, atrial fibrillation on Coumadin, hypertension, diabetes mellitus, GERD, ESRD on dialysis, anemia, dCHF, severe colonic diverticulosis (showed by CT scan on 08/31/16), who presents with tarry stool and weakness.  Pt states that she has had 7 or 8 times of bloody bowel movements since this AM. Her stool was tarry and dark red. She has generalized weakness. No nausea, vomiting or abdominal pain. Denies dizziness or lightheadedness. No chest pain or shortness of breath. Patient does not have cough, fever, chills, symptoms of UTI or unilateral weakness. She has end-stage renal disease on hemodialysis. Her last dialysis session was yesterday. Per her son, pt had negative colonoscopy more than years ago, could not remember GI doctors pain. No hx of EGD.  ED Course: pt was found to have hemoglobin 8.0 which was 8.7 on 02/20/17, INR 3.29, positive FOBT, potassium 4.5, bicarbonate 27, creatinine 6.57, BUN 22, bradycardia, no tachypnea, temperature normal, O2 sat 99% on room air. Patient is admitted to telemetry bed as inpatient.  Review of Systems:   General: no fevers, chills, no body weight gain, has fatigue HEENT: no blurry vision, hearing changes or sore throat Respiratory: no dyspnea, coughing, wheezing CV: no chest pain, no palpitations GI: no nausea, vomiting, abdominal pain, diarrhea, constipation. Has tarry stool.  GU: no dysuria, burning on urination, increased urinary frequency, hematuria. Ext: no leg edema Neuro: no unilateral weakness, numbness, or tingling, no vision change or hearing  loss Skin: no rash, no skin tear. MSK: No muscle spasm, no deformity, no limitation of range of movement in spin Heme: No easy bruising.  Travel history: No recent long distant travel.  Allergy:  Allergies  Allergen Reactions  . Asa [Aspirin] Nausea Only and Other (See Comments)    MAKES STOMACH HURT  . Codeine Rash  . Penicillins Rash     Has patient had a PCN reaction causing immediate rash, facial/tongue/throat swelling, SOB or lightheadedness with hypotension:  #  #  #  NO  #  #  #  Has patient had a PCN reaction causing severe rash involving mucus membranes or skin necrosis:  #  #  #  NO  #  #  # Has patient had a PCN reaction that required hospitalization:  #  #  #  NO  #  #  #  Has patient had a PCN reaction occurring within the last 10 years:  #  #  #  NO  #  #  #  If all answers are "NO", may proceed with Cephalosporin    Past Medical History:  Diagnosis Date  . Atrial fibrillation with RVR ([place]) 10/22/2016  . Atrial flutter ([place])   . Diabetes mellitus with end stage renal disease ([place])   . Dyspnea    with exertion  . ESRD (end stage renal disease) on dialysis ([place])    HD on T,T, Sa  . GERD (gastroesophageal reflux disease)   . Hypertension   . Pneumonia 10/2015  . Severe aortic stenosis 08/19/2016   s/p TAVR 09/2016 // b. Echo 09/30/16: EF 50-55, inf-septal AK, Gr 2 DD,  normally functioning TAVR, mean AV gradient 15 mmHg, mod MAC, mild MS, mild MR, trivial effusion    Past Surgical History:  Procedure Laterality Date  . ABDOMINAL HYSTERECTOMY    . CARDIAC CATHETERIZATION N/A 08/19/2016   Procedure: Right/Left Heart Cath and Coronary Angiography;  Surgeon: Sherren Mocha, MD;  Location: [facility];  Service: Cardiovascular;  Laterality: N/A;  . COLONOSCOPY    . IR GENERIC HISTORICAL  08/31/2016   IR RADIOLOGY PERIPHERAL GUIDED IV START 08/31/2016 Corrie Mckusick, DO [facility]  . IR GENERIC HISTORICAL  08/31/2016   IR US GUIDE VASC ACCESS RIGHT 08/31/2016  Corrie Mckusick, DO [facility]  . MULTIPLE EXTRACTIONS WITH ALVEOLOPLASTY N/A 09/25/2016   Procedure: EXTRACTION of tooth number 31 WITH ALVEOLOPLASTY AND GROSS DEBRIDEMENT OF REMAINING TEETH;  Surgeon: Lenn Cal, DDS;  Location: [facility];  Service: Oral Surgery;  Laterality: N/A;  . TEE WITHOUT CARDIOVERSION N/A 09/29/2016   Procedure: TRANSESOPHAGEAL ECHOCARDIOGRAM (TEE);  Surgeon: Sherren Mocha, MD;  Location: [facility];  Service: Open Heart Surgery;  Laterality: N/A;  . TRANSCATHETER AORTIC VALVE REPLACEMENT, TRANSFEMORAL N/A 09/29/2016   Procedure: TRANSCATHETER AORTIC VALVE REPLACEMENT, TRANSFEMORAL;  Surgeon: Sherren Mocha, MD;  Location: [facility];  Service: Open Heart Surgery;  Laterality: N/A;  . TUBAL LIGATION      Social History:  reports that she has never smoked. She has never used smokeless tobacco. She reports that she does not drink alcohol or use drugs.  Family History:  Family History  Problem Relation Age of Onset  . CAD Neg Hx      Prior to Admission medications   Medication Sig Start Date End Date Taking? Authorizing Provider  amiodarone (PACERONE) 200 MG tablet Take 1 tablet (200 mg total) by mouth daily. 11/27/16  Yes Sherren Mocha, MD  calcium acetate (PHOSLO) 667 MG capsule Take 667 mg by mouth daily with breakfast.   Yes [provider]  cloNIDine (CATAPRES) 0.1 MG tablet TAKE 1 TABLET BY MOUTH TWICE DAILY 05/13/17  Yes Richardson Dopp T, PA-C  ferrous sulfate 325 (65 FE) MG tablet Take 325 mg by mouth 3 (three) times daily with meals.    Yes [provider]  Insulin Glargine (TOUJEO SOLOSTAR [place]) Inject 10 Units into the skin at bedtime.   Yes [provider]  metoprolol succinate (TOPROL-XL) 100 MG 24 hr tablet Take 100 mg by mouth daily. 06/29/16  Yes [provider]  multivitamin (RENA-VIT) TABS tablet Take 1 tablet by mouth at bedtime. 10/04/16  Yes Liliane Shi, PA-C  SENSIPAR 30 MG tablet Take 30 mg by mouth Every  Tuesday,Thursday,and Saturday with dialysis.  08/06/16  Yes [provider]  warfarin (COUMADIN) 5 MG tablet TAKE AS DIRECTED BY THE COUMADIN CLINIC( 1 MONTH SUPPLY) Patient taking differently: Take 5 mg by mouth daily.  01/13/17  Yes Sherren Mocha, MD  aspirin EC 81 MG EC tablet Take 1 tablet (81 mg total) by mouth daily. Patient not taking: Reported on 06/24/2017 10/05/16   Richardson Dopp T, [place]    Physical Exam: Vitals:   06/24/17 0130 06/24/17 0200 06/24/17 0215 06/24/17 0245  BP: (!) 130/54 (!) 139/55 (!) 143/54 (!) 145/59  Pulse: (!) 58 (!) 58 (!) 58 (!) 57  Resp: _0 Temp:      TempSrc:      SpO2: 100% 99% 100% 100%  Weight:      Height:       General: Not in acute distress.  Pale looking. HEENT:       Eyes: PERRL, EOMI, no scleral icterus.       ENT: No discharge from the ears and nose, no pharynx injection, no tonsillar enlargement.        Neck: No JVD, no bruit, no mass felt. Heme: No neck lymph node enlargement. Cardiac: S1/S2, RRR, No murmurs, No gallops or rubs. Respiratory: No rales, wheezing, rhonchi or rubs. GI: Soft, nondistended, nontender, no rebound pain, no organomegaly, BS present. GU: No hematuria Ext: No pitting leg edema bilaterally. 2+DP/PT pulse bilaterally. Musculoskeletal: No joint deformities, No joint redness or warmth, no limitation of ROM in spin. Skin: No rashes.  Neuro: Alert, oriented X3, cranial nerves II-XII grossly intact, moves all extremities normally. Psych: Patient is not psychotic, no suicidal or hemocidal ideation.  Labs on Admission: I have personally reviewed following labs and imaging studies  CBC:  Recent Labs Lab 06/24/17 0026  WBC 8.9  HGB 8.0*  HCT 24.2*  MCV 90.0  PLT 993*   Basic Metabolic Panel:  Recent Labs Lab 06/24/17 0026  NA 137  K 4.5  CL 99*  CO2 27  GLUCOSE 176*  BUN 22*  CREATININE 6.57*  CALCIUM 7.4*   GFR: Estimated Creatinine Clearance: 5.7 mL/min (A) (by C-G formula based  on SCr of 6.57 mg/dL (H)). Liver Function Tests:  Recent Labs Lab 06/24/17 0026  AST 21  ALT 13*  ALKPHOS 74  BILITOT 0.7  PROT 5.2*  ALBUMIN 2.6*   No results for input(s): LIPASE, AMYLASE in the last 168 hours. No results for input(s): AMMONIA in the last 168 hours. Coagulation Profile:  Recent Labs Lab 06/24/17 0026  INR 3.29   Cardiac Enzymes: No results for input(s): CKTOTAL, CKMB, CKMBINDEX, TROPONINI in the last 168 hours. BNP (last 3 results) No results for input(s): PROBNP in the last 8760 hours. HbA1C: No results for input(s): HGBA1C in the last 72 hours. CBG: No results for input(s): GLUCAP in the last 168 hours. Lipid Profile: No results for input(s): CHOL, HDL, LDLCALC, TRIG, CHOLHDL, LDLDIRECT in the last 72 hours. Thyroid Function Tests: No results for input(s): TSH, T4TOTAL, FREET4, T3FREE, THYROIDAB in the last 72 hours. Anemia Panel: No results for input(s): VITAMINB12, FOLATE, FERRITIN, TIBC, IRON, RETICCTPCT in the last 72 hours. Urine analysis:    Component Value Date/Time   COLORURINE YELLOW 04/14/2013 2226   APPEARANCEUR CLEAR 04/14/2013 2226   LABSPEC 1.009 04/14/2013 2226   PHURINE 6.0 04/14/2013 2226   GLUCOSEU NEGATIVE 04/14/2013 2226   HGBUR TRACE (A) 04/14/2013 2226   BILIRUBINUR NEGATIVE 04/14/2013 2226   KETONESUR NEGATIVE 04/14/2013 2226   PROTEINUR 100 (A) 04/14/2013 2226   UROBILINOGEN 0.2 04/14/2013 2226   NITRITE NEGATIVE 04/14/2013 2226   LEUKOCYTESUR NEGATIVE 04/14/2013 2226   Sepsis Labs: _0 (procalcitonin:4,lacticidven:4) )No results found for this or any previous visit (from the past 240 hour(s)).   Radiological Exams on Admission: No results found.   EKG: Independently reviewed.  Sinus rhythm, QTC 572, first degree AV block, bifascicular block, early R progression, mild T-wave inversion in V1-V3  Assessment/Plan Principal Problem:   GIB (gastrointestinal bleeding) Active Problems:   HTN (hypertension)    S/P TAVR (transcatheter aortic valve replacement)   Atrial fibrillation, chronic (HCC)   Thrombocytopenia (HCC)   ESRD on hemodialysis ([place])   Anemia due to end stage renal disease ([place])   Type II diabetes mellitus with renal manifestations ([place])   GIB (gastrointestinal bleeding): Patient has had multiple tarry bowel movement, indicating  possible upper GI bleeding, but the patient has history of severe diverticulosis, lower GI bleed is also possible. Hemoglobin dropped from 8.7 on 02/20/17-8.0. Currently hemodynamically stable. Per her son, pt had negative colonoscopy more than years ago, could not remember GI doctors pain. No hx of EGD.  - will admit to tele bed as inpt - hold coumadin and ASA - will give 5 mg of Vk to reverse coumadin effect - NPO - IVF: 50 mL/hr of NS. - Start IV pantoprazole gtt - prn hydroxyzine IV for nausea - Avoid NSAIDs and SQ heparin - Maintain IV access (2 large bore IVs if possible). - Monitor closely and follow q6h cbc, transfuse as necessary for Hgb <7.0 - LaB: INR, PTT and type screen - please call GI in AM  Addendum: pt continues to pass out some bright red blood with clot -will give one dose of Kcentra including another 5 mg of Vk -will transfuse 1 unit of blood now -f/u cbc q6h.   Anemia due to end stage renal disease: worsened by GIB.  -continue iron supplement  Thrombocytopenia: platelet 110. Unclear etiology -Follow-up by CBC  Diabetes mellitus-type II, with renal complications, insulin dependent ([place]): Last A1c 8.5 on 09/25/16, poorly controled. Patient is taking glargine insulin at home -will decrease glargine insulin dose from 10 to 7 units daily  -SSI  S/P TAVR (transcatheter aortic valve replacement): with Bioprothesis -Functioning well by 2-D echo on 11/11/16  ESRD (end stage renal disease) on dialysis (TTS): potassium 4.5, bicarbonate 27, creatinine 6.57, BUN 22 -Left message to renal box for HD -Continue Phoslo, Sensipar  Atrial  Fibrillation: CHA2DS2-VASc Score is 6, needs oral anticoagulation. Patient is on Coumadin at home. INR is 3.29 on admission. Heart rate is well controlled. Now pt has GIB -will hold coumadin.  -continue amiodarone and metoprolol  HTN: - Continue metoprolol, clonidine  - IV hydralazine when necessary    DVT ppx: SCD Code Status: Full code Family Communication:  Yes, patient's  Son and daughter at bed side Disposition Plan:  Anticipate discharge back to previous home environment Consults called:  none Admission status: Inpatient/tele     Date of Service 06/24/2017    Ivor Costa [facility] Pager [phone]  If 7PM-7AM, please contact night-coverage [email or web address] Password [email or web address] 06/24/2017, 3:14 AM

## 2017-06-24 NOTE — ED Triage Notes (Signed)
Pt c/o weakness and black, tarry stools that started today. Pt denies nausea/vomiting or abdominal pain.

## 2017-06-24 NOTE — Progress Notes (Signed)
HD tx completed @ 2018 w/o problem, pt received 1 unit PRBC on HD tx w/o problem, no s/s of reaction, UF goal met, blood rinsed back, attempted to call report, placed on hold, when I called back CN asked me why pt was sent down prior to report. I let her know that I put pt in for transport prior to calling report per normal as it can take extended time for transport to become available and when transport arrived at the unit I then attempted to call report before pt got to floor but was placed on hold. I was then made aware that pt was going to be moved to a different room on that floor but no one called to make me aware and that the report I got was that patient placement called and told my CN that pt was going to room 3M05 and didn't say that it still needed to be cleaned or to hold before sending pt. Awaiting call back from primary nurse to get report

## 2017-06-24 NOTE — Anesthesia Postprocedure Evaluation (Signed)
Anesthesia Post Note  Patient: Adeana Grilliot  Procedure(s) Performed: ESOPHAGOGASTRODUODENOSCOPY (EGD) (N/A )     Patient location during evaluation: PACU Anesthesia Type: MAC Level of consciousness: awake and alert Pain management: pain level controlled Vital Signs Assessment: post-procedure vital signs reviewed and stable Respiratory status: spontaneous breathing, nonlabored ventilation, respiratory function stable and patient connected to nasal cannula oxygen Cardiovascular status: stable and blood pressure returned to baseline Postop Assessment: no apparent nausea or vomiting Anesthetic complications: no    Last Vitals:  Vitals:   06/24/17 1345 06/24/17 1352  BP: (!) 155/66 (!) 155/66  Pulse: 62 62  Resp: 14 16  Temp:    SpO2: 100% 100%    Last Pain:  Vitals:   06/24/17 1254  TempSrc: Oral  PainSc:                  Dracen Reigle S

## 2017-06-24 NOTE — Procedures (Signed)
Patient seen and examined on Hemodialysis. QB 350 mL/ min, UF goal 500 mL.  Dialyzing through R UE AVG.    No complaints at this time.  Treatment adjusted as needed.  Madelon Lips MD [facility] pgr [phone] 5:35 PM

## 2017-06-24 NOTE — Interval H&P Note (Signed)
History and Physical Interval Note:  06/24/2017 12:23 PM  Angela Trujillo  has presented today for surgery, with the diagnosis of GI bleeding  The various methods of treatment have been discussed with the patient and family. After consideration of risks, benefits and other options for treatment, the patient has consented to  Procedure(s): ESOPHAGOGASTRODUODENOSCOPY (EGD) (N/A) as a surgical intervention .  The patient's history has been reviewed, patient examined, no change in status, stable for surgery.  I have reviewed the patient's chart and labs.  Questions were answered to the patient's satisfaction.     Pricilla Riffle. Fuller Plan

## 2017-06-24 NOTE — ED Notes (Signed)
Admitting at bedside 

## 2017-06-24 NOTE — Progress Notes (Signed)
Patient arrived to unit per ED stretcher.  Reviewed treatment plan and this RN agrees.  Report received from bedside RN, Baldo Ash.    Patient Alert and oriented X 4. Lung sounds diminished to ausculation in all fields. No edema. Cardiac: NSR.  Prepped RUAVG with alcohol and cannulated with two 16 gauge needles.  Pulsation of blood noted.  Flushed access well with saline per protocol.  Connected and secured lines and initiated tx at 1648.  UF goal of 1500 mL and net fluid removal of 500 mL.  Will continue to monitor.

## 2017-06-24 NOTE — H&P (View-Only) (Signed)
Referring Provider: [facility]   Primary Care Physician:  Glendon Axe, MD Primary Gastroenterologist:  unassigned Reason for Consultation:  GI bleed   Attending physician's note   I have taken a history, examined the patient and reviewed the chart. I agree with the Advanced Practitioner's note, impression and recommendations.  Painless hematochezia in setting of Coumadin anticoagulation, likely diverticular bleed (numerous diverticulae throughout the colon on CT) however need to exclude UGI source especially given likely diagnosis of cirrhosis (typical features on CT and thrombocytopenia). INR is now corrected. Hb=7.2.  IV PPI. Trend CBC. EGD today.  If no source on EGD consider colonoscopy.  Abnormal pancreas on CT, distal body and tail pancreatic atrophy, possible pseudocysts or side branch ectasias, will need further elective evaluation with MRI/MRCP.    [name], MD [name] [phone] Mon-Fri 8a-5p [phone] after 5p, weekends, holidays   ASSESSMENT AND PLAN:    59. 83 yo female with ESRD on HD presenting with painless GI bleeding and acute on chronic anemia. Hemodynamically stable. She reports initial blood as being very dark though it was mixed with stool and she takes iron. Suspect this is a lower GI bleed but CT scan in 2017 suggests cirrhosis so need to exclude variceal bleeding. Otherwise suspect diverticular hemorrhage -EGD today. The risks and benefits of EGD were discussed and the patient agrees to proceed.  -BID PPI for now -Monitor hgb and transfuse if needed.  -further recommendations to follow EGD today  2. Chronic coumadin, INR has been reversed and now at 1.23.   3. ? Chronic pancreatitis with possible pseudocysts in 2017.   4. Multiple medical problems not limited to DM, HTN, hx of TAVR, AFib,   5. Chronic thrombocytopenia, probable has cirrhosis based on imaging   HPI: Angela Trujillo is a 83 y.o. female with multiple medical problems not limited  to ESRD on HD, AFib, severe AS s/p TAVR and DM. She presented to ED with onset of GI bleeding yesterday evening while taking a bath. She describes the blood as very dark though she does take iron. No abdominal or rectal pain. No history of GI bleeding. She started Coumadin in January. Lives at home, son alternate staying with her. Appetite has been fine. No chest pain. She has occasional SOB but this is chronic. use. No hx of PUD. No NSAIDS except for baby asa.   Past Medical History:  Diagnosis Date  . Atrial fibrillation with RVR ([place]) 10/22/2016  . Atrial flutter ([place])   . Diabetes mellitus with end stage renal disease ([place])   . Dyspnea    with exertion  . ESRD (end stage renal disease) on dialysis ([place])    HD on T,T, Sa  . GERD (gastroesophageal reflux disease)   . Hypertension   . Pneumonia 10/2015  . Severe aortic stenosis 08/19/2016   s/p TAVR 09/2016 // b. Echo 09/30/16: EF 50-55, inf-septal AK, Gr 2 DD, normally functioning TAVR, mean AV gradient 15 mmHg, mod MAC, mild MS, mild MR, trivial effusion    Past Surgical History:  Procedure Laterality Date  . ABDOMINAL HYSTERECTOMY    . CARDIAC CATHETERIZATION N/A 08/19/2016   Procedure: Right/Left Heart Cath and Coronary Angiography;  Surgeon: Sherren Mocha, MD;  Location: [facility];  Service: Cardiovascular;  Laterality: N/A;  . COLONOSCOPY    . IR GENERIC HISTORICAL  08/31/2016   IR RADIOLOGY PERIPHERAL GUIDED IV START 08/31/2016 Corrie Mckusick, DO [facility]  . IR GENERIC HISTORICAL  08/31/2016   IR  US GUIDE VASC ACCESS RIGHT 08/31/2016 Jaime Wagner, DO [facility]  . MULTIPLE EXTRACTIONS WITH ALVEOLOPLASTY N/A 09/25/2016   Procedure: EXTRACTION of tooth number 31 WITH ALVEOLOPLASTY AND GROSS DEBRIDEMENT OF REMAINING TEETH;  Surgeon: Ronald F Kulinski, DDS;  Location: [facility] OR;  Service: Oral Surgery;  Laterality: N/A;  . TEE WITHOUT CARDIOVERSION N/A 09/29/2016   Procedure: TRANSESOPHAGEAL ECHOCARDIOGRAM (TEE);  Surgeon:  Michael Cooper, MD;  Location: [facility] OR;  Service: Open Heart Surgery;  Laterality: N/A;  . TRANSCATHETER AORTIC VALVE REPLACEMENT, TRANSFEMORAL N/A 09/29/2016   Procedure: TRANSCATHETER AORTIC VALVE REPLACEMENT, TRANSFEMORAL;  Surgeon: Michael Cooper, MD;  Location: [facility] OR;  Service: Open Heart Surgery;  Laterality: N/A;  . TUBAL LIGATION      Prior to Admission medications   Medication Sig Start Date End Date Taking? Authorizing Provider  amiodarone (PACERONE) 200 MG tablet Take 1 tablet (200 mg total) by mouth daily. 11/27/16  Yes Cooper, Michael, MD  calcium acetate (PHOSLO) 667 MG capsule Take 667 mg by mouth daily with breakfast.   Yes [provider]  cloNIDine (CATAPRES) 0.1 MG tablet TAKE 1 TABLET BY MOUTH TWICE DAILY 05/13/17  Yes Weaver, Scott T, PA-C  ferrous sulfate 325 (65 FE) MG tablet Take 325 mg by mouth 3 (three) times daily with meals.    Yes [provider]  Insulin Glargine (TOUJEO SOLOSTAR [place]) Inject 10 Units into the skin at bedtime.   Yes [provider]  metoprolol succinate (TOPROL-XL) 100 MG 24 hr tablet Take 100 mg by mouth daily. 06/29/16  Yes [provider]  multivitamin (RENA-VIT) TABS tablet Take 1 tablet by mouth at bedtime. 10/04/16  Yes Weaver, Scott T, PA-C  SENSIPAR 30 MG tablet Take 30 mg by mouth Every Tuesday,Thursday,and Saturday with dialysis.  08/06/16  Yes [provider]  warfarin (COUMADIN) 5 MG tablet TAKE AS DIRECTED BY THE COUMADIN CLINIC( 1 MONTH SUPPLY) Patient taking differently: Take 5 mg by mouth daily.  01/13/17  Yes Cooper, Michael, MD  aspirin EC 81 MG EC tablet Take 1 tablet (81 mg total) by mouth daily. Patient not taking: Reported on 06/24/2017 10/05/16   Weaver, Scott T, PA-C    Current Facility-Administered Medications  Medication Dose Route Frequency Provider Last Rate Last Dose  . 0.9 %  sodium chloride infusion   Intravenous Continuous Niu, Xilin, MD 50 mL/hr at 06/24/17 0607 1,000 mL at 06/24/17  0607  . 0.9 %  sodium chloride infusion   Intravenous Once Niu, Xilin, MD      . acetaminophen (TYLENOL) tablet 650 mg  650 mg Oral Q6H PRN Niu, Xilin, MD       Or  . acetaminophen (TYLENOL) suppository 650 mg  650 mg Rectal Q6H PRN Niu, Xilin, MD      . amiodarone (PACERONE) tablet 200 mg  200 mg Oral Daily Niu, Xilin, MD      . calcium acetate (PHOSLO) capsule 667 mg  667 mg Oral Q breakfast Niu, Xilin, MD      . cinacalcet (SENSIPAR) tablet 30 mg  30 mg Oral Q T,Th,Sa-HD Niu, Xilin, MD      . cloNIDine (CATAPRES) tablet 0.1 mg  0.1 mg Oral BID Niu, Xilin, MD      . ferrous sulfate tablet 325 mg  325 mg Oral TID WC Niu, Xilin, MD      . hydrALAZINE (APRESOLINE) injection 5 mg  5 mg Intravenous Q2H PRN Niu, Xilin, MD      . hydrOXYzine (ATARAX/VISTARIL) tablet   10 mg  10 mg Oral TID PRN Ivor Costa, MD      . insulin aspart (novoLOG) injection 0-9 Units  0-9 Units Subcutaneous TID WC Ivor Costa, MD      . insulin glargine (LANTUS) injection 5 Units  5 Units Subcutaneous QHS Samuella Cota, MD      . metoprolol succinate (TOPROL-XL) 24 hr tablet 100 mg  100 mg Oral Daily Ivor Costa, MD      . multivitamin (RENA-VIT) tablet 1 tablet  1 tablet Oral QHS Ivor Costa, MD      . pantoprazole (PROTONIX) 80 mg in sodium chloride 0.9 % 250 mL (0.32 mg/mL) infusion  8 mg/hr Intravenous Continuous Ivor Costa, MD 25 mL/hr at 06/24/17 3299 8 mg/hr at 06/24/17 0608  . [START ON 06/27/2017] pantoprazole (PROTONIX) injection 40 mg  40 mg Intravenous Q12H Ivor Costa, MD      . zolpidem (AMBIEN) tablet 5 mg  5 mg Oral QHS PRN Ivor Costa, MD       Current Outpatient Prescriptions  Medication Sig Dispense Refill  . amiodarone (PACERONE) 200 MG tablet Take 1 tablet (200 mg total) by mouth daily. 30 tablet 11  . calcium acetate (PHOSLO) 667 MG capsule Take 667 mg by mouth daily with breakfast.    . cloNIDine (CATAPRES) 0.1 MG tablet TAKE 1 TABLET BY MOUTH TWICE DAILY 60 tablet 8  . ferrous sulfate 325 (65 FE) MG  tablet Take 325 mg by mouth 3 (three) times daily with meals.     . Insulin Glargine (TOUJEO SOLOSTAR [place]) Inject 10 Units into the skin at bedtime.    . metoprolol succinate (TOPROL-XL) 100 MG 24 hr tablet Take 100 mg by mouth daily.    . multivitamin (RENA-VIT) TABS tablet Take 1 tablet by mouth at bedtime.  0  . SENSIPAR 30 MG tablet Take 30 mg by mouth Every Tuesday,Thursday,and Saturday with dialysis.     Marland Kitchen warfarin (COUMADIN) 5 MG tablet TAKE AS DIRECTED BY THE COUMADIN CLINIC( 1 MONTH SUPPLY) (Patient taking differently: Take 5 mg by mouth daily. ) 40 tablet 2  . aspirin EC 81 MG EC tablet Take 1 tablet (81 mg total) by mouth daily. (Patient not taking: Reported on 06/24/2017)      Allergies as of 06/24/2017 - Review Complete 06/24/2017  Allergen Reaction Noted  . Asa [aspirin] Nausea Only and Other (See Comments) 04/14/2013  . Codeine Rash 04/14/2013  . Penicillins Rash 04/14/2013    Family History  Problem Relation Age of Onset  . CAD Neg Hx     Social History   Social History  . Marital status: Widowed    Spouse name: N/A  . Number of children: 5  . Years of education: N/A   Occupational History  . Not on file.   Social History Main Topics  . Smoking status: Never Smoker  . Smokeless tobacco: Never Used  . Alcohol use No  . Drug use: No  . Sexual activity: No   Other Topics Concern  . Not on file   Social History Narrative  . No narrative on file    Review of Systems: All systems reviewed and negative except where noted in HPI.  Physical Exam: Vital signs in last 24 hours: Temp:  [98.7 F (37.1 C)] 98.7 F (37.1 C) (10/04 0021) Pulse Rate:  [57-64] 61 (10/04 0845) Resp:  [11-16] 14 (10/04 0845) BP: (130-163)/(49-62) 144/56 (10/04 0845) SpO2:  [97 %-100 %] 100 % (10/04 0845) Weight:  [  140 lb (63.5 kg)] 140 lb (63.5 kg) (10/04 0026)   General:   Alert, well-developed, elderly black female in NAD Psych:  Pleasant, cooperative. Normal mood and  affect. Eyes:  Pupils equal, sclera clear, no icterus.   Conjunctiva pale Ears:  Normal auditory acuity. Nose:  No deformity, discharge,  or lesions. Neck:  Supple; no masses Lungs:  Clear throughout to auscultation.   No wheezes, crackles, or rhonchi.  Heart:  Regular rate and rhythm; no murmurs, no edema Abdomen:  Soft, non-distended, nontender, BS active, no palp mass    Rectal:  Stool with red blood mixed in on DRE Msk:  Symmetrical without gross deformities. . Pulses:  Normal pulses noted. Neurologic:  Alert and  oriented x4;  grossly normal neurologically. Skin:  Intact without significant lesions or rashes..   Intake/Output from previous day: No intake/output data recorded. Intake/Output this shift: No intake/output data recorded.  Lab Results:  Recent Labs  06/24/17 0026 06/24/17 0513  WBC 8.9 8.9  HGB 8.0* 7.2*  HCT 24.2* 21.9*  PLT 110* 104*   BMET  Recent Labs  06/24/17 0026 06/24/17 0804  NA 137 136  K 4.5 4.5  CL 99* 104  CO2 27 24  GLUCOSE 176* 116*  BUN 22* 24*  CREATININE 6.57* 6.69*  CALCIUM 7.4* 6.9*   LFT  Recent Labs  06/24/17 0026  PROT 5.2*  ALBUMIN 2.6*  AST 21  ALT 13*  ALKPHOS 74  BILITOT 0.7   PT/INR  Recent Labs  06/24/17 0026 06/24/17 0804  LABPROT 33.2* 15.4*  INR 3.29 1.23     Studies/Results: Afib, on chronic coumadin. INR 3.29, down to 1.23 after Vitamin K.  Chronic thrombocytopenia Tye Savoy, NP-C @  06/24/2017, 9:15 AM  Pager number [phone]

## 2017-06-24 NOTE — ED Notes (Signed)
Patient had a moderate amt bright red rectal bleeding with clot, patient was cleaned and repositioned in bed. Denies pain

## 2017-06-24 NOTE — ED Notes (Addendum)
Report given to [facility] in dialysis. He reports he will draw CBC and PT INR when PT arrives to dialysis

## 2017-06-24 NOTE — Op Note (Signed)
[facility] Patient Name: Angela Trujillo Procedure Date : 06/24/2017 MRN: [id number] Attending MD: Ladene Artist , MD Date of Birth: 08-Aug-1931 CSN: [id number] Age: 83 Admit Type: Inpatient Procedure:                Upper GI endoscopy Indications:              Hematochezia, Abnormal CT of the abdomen                            (suggestive of cirrhosis) Providers:                Pricilla Riffle. Fuller Plan, MD, Kingsley Plan, RN, Cletis Athens, Technician, Rejeana Brock, CRNA Referring MD:             [facility] Medicines:                Monitored Anesthesia Care Complications:            No immediate complications. Estimated Blood Loss:     Estimated blood loss was minimal. Procedure:                Pre-Anesthesia Assessment:                           - Prior to the procedure, a History and Physical                            was performed, and patient medications and                            allergies were reviewed. The patient's tolerance of                            previous anesthesia was also reviewed. The risks                            and benefits of the procedure and the sedation                            options and risks were discussed with the patient.                            All questions were answered, and informed consent                            was obtained. Prior Anticoagulants: The patient has                            taken Coumadin (warfarin), last dose was 1 day                            prior to procedure. ASA Grade Assessment: III - A  patient with severe systemic disease. After                            reviewing the risks and benefits, the patient was                            deemed in satisfactory condition to undergo the                            procedure.                           After obtaining informed consent, the endoscope was                            passed under  direct vision. Throughout the                            procedure, the patient's blood pressure, pulse, and                            oxygen saturations were monitored continuously. The                            EG-[id number] ([id number]) scope was introduced through the                            mouth, and advanced to the second part of duodenum.                            The upper GI endoscopy was accomplished without                            difficulty. The patient tolerated the procedure                            well. Scope In: Scope Out: Findings:      The examined esophagus was normal.      A few localized, small non-bleeding erosions were found on the greater       curvature of the gastric body. There were no stigmata of recent bleeding.      Patchy moderately erythematous mucosa with a mosaic appearance without       bleeding was found in the entire examined stomach. Gastritis, portal       gastropathy or both. Biopsies were taken with a cold forceps for       histology.      The exam of the stomach was otherwise normal.      Patchy mildly erythematous mucosa without active bleeding and with no       stigmata of bleeding was found in the duodenal bulb.      The second portion of the duodenum was normal. Impression:               - Normal esophagus.                           -  Non-bleeding erosive gastropathy.                           - Patchy, erythematous mucosa with mosaic pattern                            in the stomach. Biopsied.                           - Erythematous duodenopathy.                           - Normal second portion of the duodenum. Moderate Sedation:      none/MAC Recommendation:           - Return patient to hospital ward for ongoing care.                           - Clear liquid diet today.                           - Continue present medications.                           - Change Protonix to 40 mg po daily                           - Await  pathology results.                           - Perform a colonoscopy tomorrow. Procedure Code(s):        --- Professional ---                           [phone], Esophagogastroduodenoscopy, flexible,                            transoral; with biopsy, single or multiple Diagnosis Code(s):        --- Professional ---                           K31.89, Other diseases of stomach and duodenum                           K92.1, Melena (includes Hematochezia)                           R93.3, Abnormal findings on diagnostic imaging of                            other parts of digestive tract CPT copyright 2016 [employer]. All rights reserved. The codes documented in this report are preliminary and upon coder review may  be revised to meet current compliance requirements. Ladene Artist, MD 06/24/2017 12:55:14 PM This report has been signed electronically. Number of Addenda: 0

## 2017-06-24 NOTE — ED Notes (Signed)
Protonix drip was stopped to infuse [employer]

## 2017-06-24 NOTE — Consult Note (Signed)
[facility] Renal Consultation Note    Indication for Consultation:  Management of ESRD/hemodialysis, anemia, hypertension/volume, and secondary hyperparathyroidism. PCP:  HPI: Angela Trujillo is a 83 y.o. female with ESRD, HTN, A-fib (on warfarin), HX AVR (09/2016) who was admitted with hematochezia.  Limited Hx taken as patient is getting ready to go for endoscopy now. Noticed rectal bleeding last night, noted to be very dark. Has had several melanotic stools since that time. No Hx GI bleeding. Does take coumadin. No fever or chills. No abdominal pain. In ED, found to have Hgb 8 (which has trended down to 7). Transfused 1 unit blood. INR was therapeutic, reversed with Vit K. GI consulted and plan for EGD today.  From renal standpoint, dialyzes TTS at [facility], last treatment was 10/2. No recent HD issues, her access is a RUE AVG. She is due for dialysis today.  Past Medical History:  Diagnosis Date  . Atrial fibrillation with RVR ([place]) 10/22/2016  . Atrial flutter ([place])   . Diabetes mellitus with end stage renal disease ([place])   . Dyspnea    with exertion  . ESRD (end stage renal disease) on dialysis ([place])    HD on T,T, Sa  . GERD (gastroesophageal reflux disease)   . Hypertension   . Pneumonia 10/2015  . Severe aortic stenosis 08/19/2016   s/p TAVR 09/2016 // b. Echo 09/30/16: EF 50-55, inf-septal AK, Gr 2 DD, normally functioning TAVR, mean AV gradient 15 mmHg, mod MAC, mild MS, mild MR, trivial effusion   Past Surgical History:  Procedure Laterality Date  . ABDOMINAL HYSTERECTOMY    . CARDIAC CATHETERIZATION N/A 08/19/2016   Procedure: Right/Left Heart Cath and Coronary Angiography;  Surgeon: Sherren Mocha, MD;  Location: [place] CV [facility];  Service: Cardiovascular;  Laterality: N/A;  . COLONOSCOPY    . IR GENERIC HISTORICAL  08/31/2016   IR RADIOLOGY PERIPHERAL GUIDED IV START 08/31/2016 Corrie Mckusick, DO [facility]-INTERV RAD  . IR GENERIC HISTORICAL  08/31/2016    IR US GUIDE VASC ACCESS RIGHT 08/31/2016 Corrie Mckusick, DO [facility]INTER[facility]  . MULTIPLE EXTRACTIONS WITH ALVEOLOPLASTY N/A 09/25/2016   Procedure: EXTRACTION of tooth number 31 WITH ALVEOLOPLASTY AND GROSS DEBRIDEMENT OF REMAINING TEETH;  Surgeon: Lenn Cal, DDS;  Location: [place];  Service: Oral Surgery;  Laterality: N/A;  . TEE WITHOUT CARDIOVERSION N/A 09/29/2016   Procedure: TRANSESOPHAGEAL ECHOCARDIOGRAM (TEE);  Surgeon: Sherren Mocha, MD;  Location: [place];  Service: Open Heart Surgery;  Laterality: N/A;  . TRANSCATHETER AORTIC VALVE REPLACEMENT, TRANSFEMORAL N/A 09/29/2016   Procedure: TRANSCATHETER AORTIC VALVE REPLACEMENT, TRANSFEMORAL;  Surgeon: Sherren Mocha, MD;  Location: [place];  Service: Open Heart Surgery;  Laterality: N/A;  . TUBAL LIGATION     Family History  Problem Relation Age of Onset  . CAD Neg Hx    Social History:  reports that she has never smoked. She has never used smokeless tobacco. She reports that she does not drink alcohol or use drugs.  ROS: As per HPI otherwise negative.  Objective: Vitals:   06/24/17 1100 06/24/17 1142  BP: (!) 157/61 (!) 184/57  Pulse: 65 66  Resp: 16 12  Temp:  97.8 F (36.6 C)  SpO2: 97% 100%   Physical Exam  Gen: Well appearing, NAD Neck: Supple, no LAD CV: RRR; no murmur Pulm: CTA anteriorly Abdomen: Soft, non-tender Extrem: No LE edema Access: RUE AVG + thrill  Allergies  Allergen Reactions  . Asa [Aspirin] Nausea Only and Other (See Comments)  MAKES STOMACH HURT  . Codeine Rash  . Penicillins Rash     Has patient had a PCN reaction causing immediate rash, facial/tongue/throat swelling, SOB or lightheadedness with hypotension:  #  #  #  NO  #  #  #  Has patient had a PCN reaction causing severe rash involving mucus membranes or skin necrosis:  #  #  #  NO  #  #  # Has patient had a PCN reaction that required hospitalization:  #  #  #  NO  #  #  #  Has patient had a PCN reaction occurring within the last 10  years:  #  #  #  NO  #  #  #  If all answers are "NO", may proceed with Cephalosporin   Prior to Admission medications   Medication Sig Start Date End Date Taking? Authorizing Provider  amiodarone (PACERONE) 200 MG tablet Take 1 tablet (200 mg total) by mouth daily. 11/27/16  Yes Sherren Mocha, MD  calcium acetate (PHOSLO) 667 MG capsule Take 667 mg by mouth daily with breakfast.   Yes [provider]  cloNIDine (CATAPRES) 0.1 MG tablet TAKE 1 TABLET BY MOUTH TWICE DAILY 05/13/17  Yes Richardson Dopp T, PA-C  ferrous sulfate 325 (65 FE) MG tablet Take 325 mg by mouth 3 (three) times daily with meals.    Yes [provider]  Insulin Glargine (TOUJEO SOLOSTAR [place]) Inject 10 Units into the skin at bedtime.   Yes [provider]  metoprolol succinate (TOPROL-XL) 100 MG 24 hr tablet Take 100 mg by mouth daily. 06/29/16  Yes [provider]  multivitamin (RENA-VIT) TABS tablet Take 1 tablet by mouth at bedtime. 10/04/16  Yes Liliane Shi, PA-C  SENSIPAR 30 MG tablet Take 30 mg by mouth Every Tuesday,Thursday,and Saturday with dialysis.  08/06/16  Yes [provider]  warfarin (COUMADIN) 5 MG tablet TAKE AS DIRECTED BY THE COUMADIN CLINIC( 1 MONTH SUPPLY) Patient taking differently: Take 5 mg by mouth daily.  01/13/17  Yes Sherren Mocha, MD  aspirin EC 81 MG EC tablet Take 1 tablet (81 mg total) by mouth daily. Patient not taking: Reported on 06/24/2017 10/05/16   Richardson Dopp T, PA-C   Current Facility-Administered Medications  Medication Dose Route Frequency Provider Last Rate Last Dose  . 0.9 %  sodium chloride infusion   Intravenous Continuous Ivor Costa, MD 50 mL/hr at 06/24/17 0607 1,000 mL at 06/24/17 0607  . 0.9 %  sodium chloride infusion   Intravenous Once Ivor Costa, MD      . acetaminophen (TYLENOL) tablet 650 mg  650 mg Oral Q6H PRN Ivor Costa, MD       Or  . acetaminophen (TYLENOL) suppository 650 mg  650 mg Rectal Q6H PRN Ivor Costa, MD       . amiodarone (PACERONE) tablet 200 mg  200 mg Oral Daily Ivor Costa, MD   200 mg at 06/24/17 1104  . calcium acetate (PHOSLO) capsule 667 mg  667 mg Oral Q breakfast Ivor Costa, MD      . cinacalcet [facility]) tablet 30 mg  30 mg Oral Q T,Th,Sa-HD Ivor Costa, MD      . cloNIDine (CATAPRES) tablet 0.1 mg  0.1 mg Oral BID Ivor Costa, MD   0.1 mg at 06/24/17 1104  . ferrous sulfate tablet 325 mg  325 mg Oral TID WC Ivor Costa, MD      . hydrALAZINE (APRESOLINE) injection 5 mg  5 mg Intravenous Q2H PRN Ivor Costa, MD      . hydrOXYzine (ATARAX/VISTARIL) tablet 10 mg  10 mg Oral TID PRN Ivor Costa, MD      . insulin aspart (novoLOG) injection 0-9 Units  0-9 Units Subcutaneous TID WC Ivor Costa, MD      . insulin glargine (LANTUS) injection 5 Units  5 Units Subcutaneous QHS Samuella Cota, MD      . metoprolol succinate (TOPROL-XL) 24 hr tablet 100 mg  100 mg Oral Daily Ivor Costa, MD      . multivitamin (RENA-VIT) tablet 1 tablet  1 tablet Oral QHS Ivor Costa, MD      . pantoprazole (PROTONIX) 80 mg in sodium chloride 0.9 % 250 mL (0.32 mg/mL) infusion  8 mg/hr Intravenous Continuous Ivor Costa, MD   Stopped at 06/24/17 0800  . [START ON 06/27/2017] pantoprazole (PROTONIX) injection 40 mg  40 mg Intravenous Q12H Ivor Costa, MD      . zolpidem (AMBIEN) tablet 5 mg  5 mg Oral QHS PRN Ivor Costa, MD       Current Outpatient Prescriptions  Medication Sig Dispense Refill  . amiodarone (PACERONE) 200 MG tablet Take 1 tablet (200 mg total) by mouth daily. 30 tablet 11  . calcium acetate (PHOSLO) 667 MG capsule Take 667 mg by mouth daily with breakfast.    . cloNIDine (CATAPRES) 0.1 MG tablet TAKE 1 TABLET BY MOUTH TWICE DAILY 60 tablet 8  . ferrous sulfate 325 (65 FE) MG tablet Take 325 mg by mouth 3 (three) times daily with meals.     . Insulin Glargine (TOUJEO SOLOSTAR [place]) Inject 10 Units into the skin at bedtime.    . metoprolol succinate (TOPROL-XL) 100 MG 24 hr tablet Take 100 mg by mouth daily.     . multivitamin (RENA-VIT) TABS tablet Take 1 tablet by mouth at bedtime.  0  . SENSIPAR 30 MG tablet Take 30 mg by mouth Every Tuesday,Thursday,and Saturday with dialysis.     Marland Kitchen warfarin (COUMADIN) 5 MG tablet TAKE AS DIRECTED BY THE COUMADIN CLINIC( 1 MONTH SUPPLY) (Patient taking differently: Take 5 mg by mouth daily. ) 40 tablet 2  . aspirin EC 81 MG EC tablet Take 1 tablet (81 mg total) by mouth daily. (Patient not taking: Reported on 06/24/2017)     Labs: Basic Metabolic Panel:  Recent Labs Lab 06/24/17 0026 06/24/17 0804  NA 137 136  K 4.5 4.5  CL 99* 104  CO2 27 24  GLUCOSE 176* 116*  BUN 22* 24*  CREATININE 6.57* 6.69*  CALCIUM 7.4* 6.9*   Liver Function Tests:  Recent Labs Lab 06/24/17 0026  AST 21  ALT 13*  ALKPHOS 74  BILITOT 0.7  PROT 5.2*  ALBUMIN 2.6*   CBC:  Recent Labs Lab 06/24/17 0026 06/24/17 0513 06/24/17 0804  WBC 8.9 8.9 9.2  HGB 8.0* 7.2* 7.0*  HCT 24.2* 21.9* 21.7*  MCV 90.0 90.5 90.8  PLT 110* 104* 91*   CBG:  Recent Labs Lab 06/24/17 0753  GLUCAP 112*   Dialysis Orders:  TTS at [facility]) 3:30hr, 180 dialyzer, BFR 350, DFR 700, AVG, EDW 63.6kg, 2K/2.5Ca, Linear Na, UF profile 2, 16g needles - Heparin 2600 bolus + 500/hr pump - Parsabiv 2.19m IV q HD - Hectoral 335m IV q HD - Mircera 20019mIV q 2 weeks (unclear last dose) - Hep B s AG negative 05/27/17  Assessment/Plan: 1.  GI bleed: Multiple episode  melena. GI consulted, for EGD today. S/p 1U PRBCs 10/4. 2.  ESRD: Will continue HD per TTS schedule, planning for dialysis later today. No heparin. 3.  Hypertension/volume: BP variable, no edema on exam. Low UF goal today. 4.  Anemia: See above. Hgb down to 7, s/p 1U PRBCs. May need another unit blood later with HD. 5.  Metabolic bone disease: Corr Ca reasonable, follow for now. Resume VDRA with HD. Parsabiv not on formulary here. 6.  Nutrition: Alb very low. Can add supps once  eating. 7. A-fib: Warfarin on hold, INR reversed. 8. Hx AVR  Veneta Penton, PA-C 06/24/2017, 11:37 AM  [facility] Pager: [phone]  Pt seen, examined and agree w A/P as above.  Kelly Splinter MD [employer] pager [phone]   06/24/2017, 3:40 PM

## 2017-06-24 NOTE — Transfer of Care (Signed)
Immediate Anesthesia Transfer of Care Note  Patient: Angela Trujillo  Procedure(s) Performed: ESOPHAGOGASTRODUODENOSCOPY (EGD) (N/A )  Patient Location: PACU  Anesthesia Type:MAC  Level of Consciousness: awake and alert   Airway & Oxygen Therapy: Patient Spontanous Breathing  Post-op Assessment: Report given to RN and Post -op Vital signs reviewed and stable  Post vital signs: Reviewed and stable  Last Vitals:  Vitals:   06/24/17 1100 06/24/17 1142  BP: (!) 157/61 (!) 184/57  Pulse: 65 66  Resp: 16 12  Temp:  36.6 C  SpO2: 97% 100%    Last Pain:  Vitals:   06/24/17 1142  TempSrc: Oral  PainSc:          Complications: No apparent anesthesia complications

## 2017-06-24 NOTE — ED Provider Notes (Signed)
[place] DEPT Provider Note   CSN: [id number] Arrival date & time: 06/24/17  0009     History   Chief Complaint Chief Complaint  Patient presents with  . Weakness    HPI Angela Trujillo is a 83 y.o. female.  The history is provided by the patient.  She has had multiple bloody bowel movements since this afternoon. She estimates 6 or 8 bowel movements which consisted of dark red to black stool. She denies abdominal pain and denies nausea vomiting. She denies dizziness or lightheadedness. She did take a dose of loperamide without relief. Of note, she is anticoagulated on warfarin because of history of atrial fibrillation. Last INR was 2 weeks ago and was subtherapeutic at 1.6. Also, she has end-stage renal disease on hemodialysis-last dialysis session was yesterday.  Past Medical History:  Diagnosis Date  . Atrial fibrillation with RVR ([place]) 10/22/2016  . Atrial flutter ([place])   . Diabetes mellitus with end stage renal disease ([place])   . Dyspnea    with exertion  . ESRD (end stage renal disease) on dialysis ([place])    HD on T,T, Sa  . GERD (gastroesophageal reflux disease)   . Hypertension   . Pneumonia 10/2015  . Severe aortic stenosis 08/19/2016   s/p TAVR 09/2016 // b. Echo 09/30/16: EF 50-55, inf-septal AK, Gr 2 DD, normally functioning TAVR, mean AV gradient 15 mmHg, mod MAC, mild MS, mild MR, trivial effusion    Patient Active Problem List   Diagnosis Date Noted  . ESRD on hemodialysis ([place])   . Respiratory distress 02/16/2017  . Acute respiratory failure with hypoxia ([place])   . Acute hyperkalemia   . Hypervolemia   . Sepsis ([place])   . Elevated lactic acid level   . PAF (paroxysmal atrial fibrillation) ([place])   . Chronic anticoagulation   . Transaminitis   . Encounter for therapeutic drug monitoring 10/23/2016  . Atrial fibrillation with RVR ([place]) 10/22/2016  . Thrombocytopenia ([place]) 10/22/2016  . Elevated troponin 10/22/2016  . S/P TAVR (transcatheter aortic valve  replacement) 10/04/2016  . Hypoglycemia 10/04/2016  . ESRD on dialysis ([place]) 10/04/2016  . Chronic diastolic CHF (congestive heart failure) ([place]) 10/04/2016  . Altered mental status   . Chronic periodontitis 09/23/2016  . Atrial flutter ([place])   . Normocytic anemia 04/15/2013  . Acute renal failure ([place]) 04/15/2013  . HTN (hypertension) 04/15/2013  . Diabetes mellitus, type II, insulin dependent ([place]) 04/15/2013    Past Surgical History:  Procedure Laterality Date  . ABDOMINAL HYSTERECTOMY    . CARDIAC CATHETERIZATION N/A 08/19/2016   Procedure: Right/Left Heart Cath and Coronary Angiography;  Surgeon: Sherren Mocha, MD;  Location: [place] CV [facility];  Service: Cardiovascular;  Laterality: N/A;  . COLONOSCOPY    . IR GENERIC HISTORICAL  08/31/2016   IR RADIOLOGY PERIPHERAL GUIDED IV START 08/31/2016 Corrie Mckusick, DO [facility]-INTER[facility] RAD  . IR GENERIC HISTORICAL  08/31/2016   IR US GUIDE VASC ACCESS RIGHT 08/31/2016 Corrie Mckusick, DO [facility]INTER[facility]  . MULTIPLE EXTRACTIONS WITH ALVEOLOPLASTY N/A 09/25/2016   Procedure: EXTRACTION of tooth number 31 WITH ALVEOLOPLASTY AND GROSS DEBRIDEMENT OF REMAINING TEETH;  Surgeon: Lenn Cal, DDS;  Location: [place];  Service: Oral Surgery;  Laterality: N/A;  . TEE WITHOUT CARDIOVERSION N/A 09/29/2016   Procedure: TRANSESOPHAGEAL ECHOCARDIOGRAM (TEE);  Surgeon: Sherren Mocha, MD;  Location: [place];  Service: Open Heart Surgery;  Laterality: N/A;  . TRANSCATHETER AORTIC VALVE REPLACEMENT, TRANSFEMORAL N/A 09/29/2016   Procedure: TRANSCATHETER AORTIC VALVE REPLACEMENT,  TRANSFEMORAL;  Surgeon: Sherren Mocha, MD;  Location: [place];  Service: Open Heart Surgery;  Laterality: N/A;  . TUBAL LIGATION      OB History    No data available       Home Medications    Prior to Admission medications   Medication Sig Start Date End Date Taking? Authorizing Provider  amiodarone (PACERONE) 200 MG tablet Take 1 tablet (200 mg total) by mouth daily. 11/27/16   Sherren Mocha, MD  aspirin EC 81 MG EC tablet Take 1 tablet (81 mg total) by mouth daily. 10/05/16   Richardson Dopp T, PA-C  calcium acetate (PHOSLO) 667 MG capsule Take 667 mg by mouth daily with breakfast.    [provider]  cloNIDine (CATAPRES) 0.1 MG tablet TAKE 1 TABLET BY MOUTH TWICE DAILY 05/13/17   Richardson Dopp T, PA-C  ferrous sulfate 325 (65 FE) MG tablet Take 325 mg by mouth 3 (three) times daily with meals.     [provider]  Insulin Glargine (TOUJEO SOLOSTAR [place]) Inject 10 Units into the skin at bedtime.    [provider]  metoprolol succinate (TOPROL-XL) 100 MG 24 hr tablet Take 100 mg by mouth daily. 06/29/16   [provider]  multivitamin (RENA-VIT) TABS tablet Take 1 tablet by mouth at bedtime. 10/04/16   Richardson Dopp T, PA-C  SENSIPAR 30 MG tablet Take 30 mg by mouth Every Tuesday,Thursday,and Saturday with dialysis.  08/06/16   [provider]  warfarin (COUMADIN) 5 MG tablet TAKE AS DIRECTED BY THE COUMADIN CLINIC( 1 MONTH SUPPLY) Patient taking differently: Take 5 mg by mouth daily at 6 PM. TAKE AS DIRECTED BY THE COUMADIN CLINIC( 1 MONTH SUPPLY) 01/13/17   Sherren Mocha, MD    Family History Family History  Problem Relation Age of Onset  . CAD Neg Hx     Social History Social History  Substance Use Topics  . Smoking status: Never Smoker  . Smokeless tobacco: Never Used  . Alcohol use No     Allergies   Asa [aspirin]; Codeine; and Penicillins   Review of Systems Review of Systems  All other systems reviewed and are negative.    Physical Exam Updated Vital Signs BP (!) 147/49 (BP Location: Left Arm)   Pulse 64   Temp 98.7 F (37.1 C) (Oral)   Resp 16   Ht _0  (1.651 m)   Wt 63.5 kg (140 lb)   SpO2 99%   BMI 23.30 kg/m   Physical Exam  Nursing note and vitals reviewed.  83 year old female, resting comfortably and in no acute distress. Vital signs are significant for hypertension. Oxygen saturation is  99%, which is normal. Head is normocephalic and atraumatic. PERRLA, EOMI. Oropharynx is clear. Neck is nontender and supple without adenopathy or JVD. Back is nontender and there is no CVA tenderness. Lungs are clear without rales, wheezes, or rhonchi. Chest is nontender. Heart has regular rate and rhythm with 2/6 systolic ejection murmur best heard along the left sternal border. Abdomen is soft, flat, nontender without masses or hepatosplenomegaly and peristalsis is normoactive. Rectal: Decreased sphincter tone. Dark red blood present. Extremities have no cyanosis or edema, full range of motion is present. AV fistula is present in the right upper arm with strong thrill present. Skin is warm and dry without rash. Neurologic: Mental status is normal, cranial nerves are intact, there are no motor or sensory deficits.  ED Treatments / Results  Labs (all labs ordered  are listed, but only abnormal results are displayed) Labs Reviewed  COMPREHENSIVE METABOLIC PANEL - Abnormal; Notable for the following:       Result Value   Chloride 99 (*)    Glucose, Bld 176 (*)    BUN 22 (*)    Creatinine, Ser 6.57 (*)    Calcium 7.4 (*)    Total Protein 5.2 (*)    Albumin 2.6 (*)    ALT 13 (*)    GFR calc non Af Amer 5 (*)    GFR calc Af Amer 6 (*)    All other components within normal limits  CBC - Abnormal; Notable for the following:    RBC 2.69 (*)    Hemoglobin 8.0 (*)    HCT 24.2 (*)    RDW 16.5 (*)    Platelets 110 (*)    All other components within normal limits  PROTIME-INR - Abnormal; Notable for the following:    Prothrombin Time 33.2 (*)    All other components within normal limits  POC OCCULT BLOOD, ED - Abnormal; Notable for the following:    Fecal Occult Bld POSITIVE (*)    All other components within normal limits  CBC  CBC  CBC  CBC  APTT  POC OCCULT BLOOD, ED  TYPE AND SCREEN    EKG  EKG Interpretation  Date/Time:  Thursday June 24 2017 00:20:00 EDT Ventricular  Rate:  65 PR Interval:  268 QRS Duration: 158 QT Interval:  550 QTC Calculation: 572 R Axis:   -80 Text Interpretation:  Sinus rhythm with 1st degree A-V block Right bundle branch block Left anterior fascicular block ** Bifascicular block ** Left ventricular hypertrophy with repolarization abnormality Cannot rule out Septal infarct , age undetermined Abnormal ECG When compared with ECG of 02/16/2017, No significant change was found Confirmed by Delora Fuel ([id number]) on 06/24/2017 1:22:21 AM      Procedures Procedures (including critical care time)  Medications Ordered in ED Medications  phytonadione (VITAMIN K) 5 mg in dextrose 5 % 50 mL IVPB (not administered)  cloNIDine (CATAPRES) tablet 0.1 mg (not administered)  ferrous sulfate tablet 325 mg (not administered)  Insulin Glargine SOPN 7 Units (not administered)  amiodarone (PACERONE) tablet 200 mg (not administered)  multivitamin (RENA-VIT) tablet 1 tablet (not administered)  calcium acetate (PHOSLO) capsule 667 mg (not administered)  cinacalcet (SENSIPAR) tablet 30 mg (not administered)  metoprolol succinate (TOPROL-XL) 24 hr tablet 100 mg (not administered)  pantoprazole (PROTONIX) 80 mg in sodium chloride 0.9 % 100 mL IVPB (not administered)  pantoprazole (PROTONIX) 80 mg in sodium chloride 0.9 % 250 mL (0.32 mg/mL) infusion (not administered)  pantoprazole (PROTONIX) injection 40 mg (not administered)  insulin aspart (novoLOG) injection 0-9 Units (not administered)     Initial Impression / Assessment and Plan / ED Course  I have reviewed the triage vital signs and the nursing notes.  Pertinent lab results that were available during my care of the patient were reviewed by me and considered in my medical decision making (see chart for details).  Lower GI bleed. Statistically, this is most likely to be due to diverticulosis. Old records are reviewed, and she has no prior visits for GI bleeding, but CT angiogram of abdomen and  pelvis in December 2017 show numerous colonic diverticuli. INR is slightly supratherapeutic at 3.28. Thrombocytopenia is present and not significantly changed from baseline. At this point, no indication for emergent correction of anticoagulated state Hemoglobin has only dropped slightly, and blood pressure  and heart rate are at her baseline. She will need to be observed in the hospital, transfused if hemoglobin drops further. Case is discussed with Dr. Blaine Hamper of [facility] hospital[facility], who agrees to admit the patient.  Final Clinical Impressions(s) / ED Diagnoses   Final diagnoses:  Acute lower GI bleeding  Anticoagulated on warfarin  End-stage renal disease on hemodialysis ([place])  Anemia associated with chronic renal failure  Thrombocytopenia (HCC)    New Prescriptions New Prescriptions   No medications on file     Delora Fuel, MD 33/54/56 [phone]

## 2017-06-24 NOTE — ED Notes (Signed)
Pt does dialysis Tues, Thurs, Sat. Takes coumadin.

## 2017-06-24 NOTE — Progress Notes (Signed)
Received pt from [facility], RN, VSS, needs 1 unit PRBC to be transfused, will cont to monitor while on HD tx

## 2017-06-24 NOTE — Anesthesia Preprocedure Evaluation (Signed)
Anesthesia Evaluation  Patient identified by MRN, date of birth, ID band Patient awake    Reviewed: Allergy & Precautions, NPO status , Patient's Chart, lab work & pertinent test results  Airway Mallampati: II  TM Distance: >3 FB Neck ROM: Full    Dental no notable dental hx.    Pulmonary neg pulmonary ROS,    Pulmonary exam normal breath sounds clear to auscultation       Cardiovascular hypertension, Normal cardiovascular exam+ Valvular Problems/Murmurs AS  Rhythm:Regular Rate:Normal     Neuro/Psych negative neurological ROS  negative psych ROS   GI/Hepatic negative GI ROS, Neg liver ROS,   Endo/Other  diabetes  Renal/GU DialysisRenal disease  negative genitourinary   Musculoskeletal negative musculoskeletal ROS (+)   Abdominal   Peds negative pediatric ROS (+)  Hematology  (+) anemia ,   Anesthesia Other Findings   Reproductive/Obstetrics negative OB ROS                             Anesthesia Physical Anesthesia Plan  ASA: IV  Anesthesia Plan: MAC   Post-op Pain Management:    Induction: Intravenous  PONV Risk Score and Plan: 0 and Treatment may vary due to age or medical condition  Airway Management Planned: Nasal Cannula  Additional Equipment:   Intra-op Plan:   Post-operative Plan:   Informed Consent: I have reviewed the patients History and Physical, chart, labs and discussed the procedure including the risks, benefits and alternatives for the proposed anesthesia with the patient or authorized representative who has indicated his/her understanding and acceptance.   Dental advisory given  Plan Discussed with: CRNA and Surgeon  Anesthesia Plan Comments:         Anesthesia Quick Evaluation

## 2017-06-25 ENCOUNTER — Encounter (HOSPITAL_COMMUNITY)
Admission: EM | Disposition: A | Discharge status: Home Health | Payer: Self-pay | Source: Home / Self Care | Attending: Family Medicine

## 2017-06-25 ENCOUNTER — Inpatient Hospital Stay (HOSPITAL_COMMUNITY): Payer: Medicare Other | Admitting: Anesthesiology

## 2017-06-25 ENCOUNTER — Encounter (HOSPITAL_COMMUNITY): Payer: Self-pay | Admitting: Anesthesiology

## 2017-06-25 DIAGNOSIS — D123 Benign neoplasm of transverse colon: Secondary | ICD-10-CM

## 2017-06-25 DIAGNOSIS — K5731 Diverticulosis of large intestine without perforation or abscess with bleeding: Principal | ICD-10-CM

## 2017-06-25 DIAGNOSIS — Z952 Presence of prosthetic heart valve: Secondary | ICD-10-CM

## 2017-06-25 DIAGNOSIS — K922 Gastrointestinal hemorrhage, unspecified: Secondary | ICD-10-CM

## 2017-06-25 HISTORY — PX: COLONOSCOPY: SHX5424

## 2017-06-25 LAB — HEPATITIS B SURFACE ANTIGEN: HEP B S AG: NEGATIVE

## 2017-06-25 LAB — GLUCOSE, CAPILLARY
GLUCOSE-CAPILLARY: 69 mg/dL (ref 65–99)
GLUCOSE-CAPILLARY: 73 mg/dL (ref 65–99)
Glucose-Capillary: 202 mg/dL — ABNORMAL HIGH (ref 65–99)
Glucose-Capillary: 234 mg/dL — ABNORMAL HIGH (ref 65–99)

## 2017-06-25 LAB — CBC
HCT: 27.8 % — ABNORMAL LOW (ref 36.0–46.0)
HCT: 28.1 % — ABNORMAL LOW (ref 36.0–46.0)
HCT: 26.5 % — ABNORMAL LOW (ref 36.0–46.0)
Hemoglobin: 9.3 g/dL — ABNORMAL LOW (ref 12.0–15.0)
Hemoglobin: 9.4 g/dL — ABNORMAL LOW (ref 12.0–15.0)
Hemoglobin: 8.8 g/dL — ABNORMAL LOW (ref 12.0–15.0)
MCH: 29.5 pg (ref 26.0–34.0)
MCH: 29.2 pg (ref 26.0–34.0)
MCH: 29 pg (ref 26.0–34.0)
MCHC: 33.5 g/dL (ref 30.0–36.0)
MCHC: 33.5 g/dL (ref 30.0–36.0)
MCHC: 33.2 g/dL (ref 30.0–36.0)
MCV: 88.3 fL (ref 78.0–100.0)
MCV: 87.3 fL (ref 78.0–100.0)
MCV: 87.5 fL (ref 78.0–100.0)
PLATELETS: 71 10*3/uL — AB (ref 150–400)
PLATELETS: 70 10*3/uL — AB (ref 150–400)
PLATELETS: 69 10*3/uL — AB (ref 150–400)
RBC: 3.15 MIL/uL — AB (ref 3.87–5.11)
RBC: 3.22 MIL/uL — ABNORMAL LOW (ref 3.87–5.11)
RBC: 3.03 MIL/uL — AB (ref 3.87–5.11)
RDW: 16.8 % — AB (ref 11.5–15.5)
RDW: 16.8 % — AB (ref 11.5–15.5)
RDW: 17.1 % — AB (ref 11.5–15.5)
WBC: 7 10*3/uL (ref 4.0–10.5)
WBC: 7.5 10*3/uL (ref 4.0–10.5)
WBC: 8.3 10*3/uL (ref 4.0–10.5)

## 2017-06-25 LAB — PROTIME-INR
INR: 1.09
Prothrombin Time: 14 seconds (ref 11.4–15.2)

## 2017-06-25 SURGERY — COLONOSCOPY
Anesthesia: Monitor Anesthesia Care

## 2017-06-25 MED ORDER — PRO-STAT SUGAR FREE PO LIQD
30.0000 mL | Freq: Two times a day (BID) | ORAL | Status: DC
  Administered 2017-06-25 – 2017-06-26 (×2): 30 mL via ORAL
  Filled 2017-06-25 (×2): qty 30

## 2017-06-25 MED ORDER — ONDANSETRON HCL 4 MG/2ML IJ SOLN
4.0000 mg | Freq: Four times a day (QID) | INTRAMUSCULAR | Status: DC | PRN
  Administered 2017-06-25: 4 mg via INTRAVENOUS
  Filled 2017-06-25: qty 2

## 2017-06-25 MED ORDER — DEXTROSE 50 % IV SOLN
INTRAVENOUS | Status: AC
  Administered 2017-06-25: 25 mL
  Filled 2017-06-25: qty 50

## 2017-06-25 MED ORDER — PROPOFOL 10 MG/ML IV BOLUS
INTRAVENOUS | Status: DC | PRN
  Administered 2017-06-25: 10 mg via INTRAVENOUS
  Administered 2017-06-25: 20 mg via INTRAVENOUS
  Administered 2017-06-25: 40 mg via INTRAVENOUS
  Administered 2017-06-25: 20 mg via INTRAVENOUS
  Administered 2017-06-25 (×2): 10 mg via INTRAVENOUS

## 2017-06-25 MED ORDER — LIDOCAINE 2% (20 MG/ML) 5 ML SYRINGE
INTRAMUSCULAR | Status: DC | PRN
  Administered 2017-06-25: 60 mg via INTRAVENOUS

## 2017-06-25 NOTE — Progress Notes (Signed)
Explained the purpose of bowel prep to the patient, she is hhaving hard time getting the remaining 480 ml down at this time

## 2017-06-25 NOTE — Plan of Care (Signed)
Problem: Education: Goal: Knowledge of [facility] Education information/materials will improve Outcome: Progressing Discussed with patient plan of care for the evening and bowel preparation with some teach back displayed

## 2017-06-25 NOTE — Anesthesia Preprocedure Evaluation (Signed)
Anesthesia Evaluation  Patient identified by MRN, date of birth, ID band Patient awake    Reviewed: Allergy & Precautions, NPO status , Patient's Chart, lab work & pertinent test results, reviewed documented beta blocker date and time   Airway Mallampati: II  TM Distance: >3 FB Neck ROM: Full    Dental no notable dental hx.    Pulmonary neg pulmonary ROS,    Pulmonary exam normal breath sounds clear to auscultation       Cardiovascular hypertension, Pt. on medications and Pt. on home beta blockers Normal cardiovascular exam+ Valvular Problems/Murmurs (s/p TAVR) AS  Rhythm:Regular Rate:Normal     Neuro/Psych negative neurological ROS  negative psych ROS   GI/Hepatic negative GI ROS, Neg liver ROS,   Endo/Other  diabetes  Renal/GU DialysisRenal disease  negative genitourinary   Musculoskeletal negative musculoskeletal ROS (+)   Abdominal   Peds negative pediatric ROS (+)  Hematology  (+) anemia ,   Anesthesia Other Findings   Reproductive/Obstetrics negative OB ROS                             Anesthesia Physical  Anesthesia Plan  ASA: IV  Anesthesia Plan: MAC   Post-op Pain Management:    Induction: Intravenous  PONV Risk Score and Plan: 2 and Ondansetron, Treatment may vary due to age or medical condition and Propofol infusion  Airway Management Planned: Nasal Cannula  Additional Equipment:   Intra-op Plan:   Post-operative Plan:   Informed Consent: I have reviewed the patients History and Physical, chart, labs and discussed the procedure including the risks, benefits and alternatives for the proposed anesthesia with the patient or authorized representative who has indicated his/her understanding and acceptance.   Dental advisory given  Plan Discussed with: CRNA and Surgeon  Anesthesia Plan Comments:         Anesthesia Quick Evaluation

## 2017-06-25 NOTE — Transfer of Care (Signed)
Immediate Anesthesia Transfer of Care Note  Patient: Angela Trujillo  Procedure(s) Performed: COLONOSCOPY (N/A )  Patient Location: Endoscopy Unit  Anesthesia Type:MAC  Level of Consciousness: sedated  Airway & Oxygen Therapy: Patient Spontanous Breathing and Patient connected to nasal cannula oxygen  Post-op Assessment: Report given to RN and Post -op Vital signs reviewed and stable  Post vital signs: Reviewed and stable  Last Vitals:  Vitals:   06/25/17 0600 06/25/17 0903  BP: (!) 156/66 (!) 173/62  Pulse: 69 69  Resp: 11 15  Temp:  36.9 C  SpO2: 99% 97%    Last Pain:  Vitals:   06/25/17 0903  TempSrc: Oral  PainSc:          Complications: No apparent anesthesia complications

## 2017-06-25 NOTE — Progress Notes (Signed)
PROGRESS NOTE  Angela Trujillo VEH:[id number] DOB: June 26, 1931 DOA: 06/24/2017 PCP: Glendon Axe, MD  Brief Narrative: 83 year old woman PMH atrial fibrillation on warfarin, status post TAPVR with bioprosthetic valve, diabetes mellitus, end-stage renal disease, presented with tarry stools and weakness.admitted for GI bleedof unclear etiology, cannot remember who performed the previous colonoscopy.because of ongoing bleeding, patient received vitamin K and then Kcentra.  Assessment/Plan GIB with ABLA complicated by supratherapeutic INR on warfarin for afib. EGD unremarkable. -Hemoglobin improved status post 2 units PRBC. No definite ongoing bleeding. Plan for colonoscopy today per GI. -Continue to trend hemoglobin -PPI daily  PAF -Continue amiodarone  S/p bioprosthetic TAVR 09/2016  Chronic thrombocytopenia, complicated by acute blood loss. -Etiology of long-term from cytopenia unclear. Seems to be stabilizing from an acute point of view. -follow CBC  End-stage renal disease on hemodialysis Tuesday, Thursday, Saturday -s/p HD 10/4  Anemia of chronic kidney disease  Diabetes mellitus type 2 -mild hypoglycemia. SSI. D/c Lantus until eating well.   DVT prophylaxis: SCDs Code Status: full Family Communication:   Disposition Plan: home    Murray Hodgkins, MD  [facility] Direct contact: [phone] --[place]  --[email or web address]; password [email or web address]  7PM-7AM contact night coverage as above 06/25/2017, 9:25 AM  LOS: 1 day   Consultants:  GI  Procedures:  EGD Impression:               - Normal esophagus.                           - Non-bleeding erosive gastropathy.                           - Patchy, erythematous mucosa with mosaic pattern                            in the stomach. Biopsied.                           - Erythematous duodenopathy.                           - Normal second portion of the duodenum.  Antimicrobials:    Interval  history/Subjective: Discussed with nursing. Some clots with bowel prep but no frank bleeding noted.  Patient tired but otherwise feels well. No chest pain, no shortness of breath. No abdominal pain.  Objective: Vitals:  Afebrile, 98.5, 15, 69, 173/62, 97% on room air  Exam:     Constitutional. Appears calm, comfortable.  Eyes. Pupils, irises, lids appear unremarkable.  ENT. Grossly normal hearing, lips, tongue.  Cardiovascular. Regular rate and rhythm. No murmur, rub or gallop. No lower extremity edema.  Respiratory. Clear to auscultation bilaterally. No wheezes, rales or rhonchi. Normal respiratory effort.  Abdomen. Soft, nontender, nondistended.  Psychiatric. Grossly normal mood and affect. Speech fluent and appropriate.   I have personally reviewed the following:   Labs:  Hypoglycemia last evening  Hemoglobin stable over the last 5 hours. 9.4. Platelet stabilizing, 70.    Scheduled Meds: . [MAR Hold] amiodarone  200 mg Oral Daily  . [MAR Hold] calcium acetate  667 mg Oral Q breakfast  . [MAR Hold] cinacalcet  30 mg Oral Q T,Th,Sa-HD  . [MAR Hold] cloNIDine  0.1 mg Oral BID  . [MAR Hold] doxercalciferol  3  mcg Intravenous Q T,Th,Sa-HD  . [MAR Hold] ferrous sulfate  325 mg Oral TID WC  . [MAR Hold] insulin aspart  0-9 Units Subcutaneous TID WC  . [MAR Hold] insulin glargine  5 Units Subcutaneous QHS  . [MAR Hold] metoprolol succinate  100 mg Oral Daily  . [MAR Hold] multivitamin  1 tablet Oral QHS  . [MAR Hold] pantoprazole  40 mg Oral Q0600   Continuous Infusions: . [MAR Hold] sodium chloride    . dextrose 5 % and 0.45% NaCl 50 mL/hr at 06/24/17 2335    Principal Problem:   GIB (gastrointestinal bleeding) Active Problems:   S/P TAVR (transcatheter aortic valve replacement)   Atrial fibrillation, chronic (HCC)   Thrombocytopenia (HCC)   ESRD on hemodialysis ([place])   Anemia due to end stage renal disease (HCC)   Type II diabetes mellitus with renal  manifestations ([place])   Acute blood loss anemia   Hematochezia   Abnormal CT scan, liver   LOS: 1 day

## 2017-06-25 NOTE — Progress Notes (Signed)
Hypoglycemic Event  CBG: 57  Treatment: orange juice  Symptoms: asymptomatic  Follow-up CBG: Time: 2221 CBG Result: 109  Possible Reasons for Event: no full meal in dialysis  Comments/MD notified: IVF had been changed to [facility] [place], [place] Radu [place]

## 2017-06-25 NOTE — Interval H&P Note (Signed)
History and Physical Interval Note:  06/25/2017 9:11 AM  Angela Trujillo  has presented today for surgery, with the diagnosis of hematochezia  The various methods of treatment have been discussed with the patient and family. After consideration of risks, benefits and other options for treatment, the patient has consented to  Procedure(s): COLONOSCOPY (N/A) as a surgical intervention .  The patient's history has been reviewed, patient examined, no change in status, stable for surgery.  I have reviewed the patient's chart and labs.  Questions were answered to the patient's satisfaction.     Pricilla Riffle. Fuller Plan

## 2017-06-25 NOTE — Anesthesia Postprocedure Evaluation (Signed)
Anesthesia Post Note  Patient: Angela Trujillo  Procedure(s) Performed: COLONOSCOPY (N/A )     Patient location during evaluation: PACU Anesthesia Type: MAC Level of consciousness: awake and alert Pain management: pain level controlled Vital Signs Assessment: post-procedure vital signs reviewed and stable Respiratory status: spontaneous breathing, nonlabored ventilation, respiratory function stable and patient connected to nasal cannula oxygen Cardiovascular status: stable and blood pressure returned to baseline Postop Assessment: no apparent nausea or vomiting Anesthetic complications: no    Last Vitals:  Vitals:   06/25/17 1149 06/25/17 1213  BP: (!) 148/52 (!) 163/61  Pulse: 64 64  Resp: 14 17  Temp:  (!) 36.4 C  SpO2: 100% 97%    Last Pain:  Vitals:   06/25/17 1213  TempSrc: Oral  PainSc:                  Angela Trujillo

## 2017-06-25 NOTE — Progress Notes (Signed)
CBG 69 Pt NPO going for EGD  Given 1/2 amp ( 25 ml) of D50 IV push Pt alert & oriented - Transport for EGD notified of CBG

## 2017-06-25 NOTE — Progress Notes (Signed)
[facility] Progress Note   Subjective: Just returned from Endo. Still slightly lethargic but oriented X 3. No C/Os.   Objective Vitals:   06/25/17 0903 06/25/17 1139 06/25/17 1149 06/25/17 1213  BP: (!) 173/62 (!) 136/53 (!) 148/52 (!) 163/61  Pulse: 69 62 64 64  Resp: 15 16 14 17   Temp: 98.5 F (36.9 C)   (!) 97.5 F (36.4 C)  TempSrc: Oral   Oral  SpO2: 97% 99% 100% 97%  Weight:      Height:       Physical Exam General: WNWD, NAD Heart: S1,S2, RRR Lungs: CTAB A/P Abdomen: active BS, non-tender Extremities: No LE edema Dialysis Access: RUA AVG + bruit   Additional Objective Labs: Basic Metabolic Panel:  Recent Labs Lab 06/24/17 0026 06/24/17 0804  NA 137 136  K 4.5 4.5  CL 99* 104  CO2 27 24  GLUCOSE 176* 116*  BUN 22* 24*  CREATININE 6.57* 6.69*  CALCIUM 7.4* 6.9*   Liver Function Tests:  Recent Labs Lab 06/24/17 0026  AST 21  ALT 13*  ALKPHOS 74  BILITOT 0.7  PROT 5.2*  ALBUMIN 2.6*   No results for input(s): LIPASE, AMYLASE in the last 168 hours. CBC:  Recent Labs Lab 06/24/17 0513 06/24/17 0804 06/24/17 1834 06/25/17 0332 06/25/17 0832  WBC 8.9 9.2 7.6 7.0 7.5  HGB 7.2* 7.0* 7.3* 9.3* 9.4*  HCT 21.9* 21.7* 22.0* 27.8* 28.1*  MCV 90.5 90.8 89.8 88.3 87.3  PLT 104* 91* 90* 71* 70*   Blood Culture    Component Value Date/Time   SDES BLOOD LEFT HAND 02/16/2017 2254   SPECREQUEST IN PEDIATRIC BOTTLE Blood Culture adequate volume 02/16/2017 2254   CULT NO GROWTH 5 DAYS 02/16/2017 2254   REPTSTATUS 02/22/2017 FINAL 02/16/2017 2254    Cardiac Enzymes: No results for input(s): CKTOTAL, CKMB, CKMBINDEX, TROPONINI in the last 168 hours. CBG:  Recent Labs Lab 06/24/17 2133 06/24/17 2221 06/24/17 2332 06/25/17 0830 06/25/17 1211  GLUCAP 57* 109* 107* 69 73   Iron Studies: No results for input(s): IRON, TIBC, TRANSFERRIN, FERRITIN in the last 72 hours. @lablastinr3 @ Studies/Results: No results  found. Medications: . dextrose 5 % and 0.45% NaCl 50 mL/hr at 06/24/17 2335   . amiodarone  200 mg Oral Daily  . calcium acetate  667 mg Oral Q breakfast  . cinacalcet  30 mg Oral Q T,Th,Sa-HD  . cloNIDine  0.1 mg Oral BID  . doxercalciferol  3 mcg Intravenous Q T,Th,Sa-HD  . ferrous sulfate  325 mg Oral TID WC  . insulin aspart  0-9 Units Subcutaneous TID WC  . metoprolol succinate  100 mg Oral Daily  . multivitamin  1 tablet Oral QHS  . pantoprazole  40 mg Oral Q0600     Dialysis Orders:  TTS at [facility]) 3:30hr, 180 dialyzer, BFR 350, DFR 700, AVG, EDW 63.6kg, 2K/2.5Ca, Linear Na, UF profile 2, 16g needles - Heparin 2600 bolus + 500/hr pump - Parsabiv 2.5mg  IV q HD - Hectoral 91mcg IV q HD - Mircera 268mcg IV q 2 weeks (unclear last dose) - Hep B s AG negative 05/27/17  Assessment/Plan: 1.  GI bleed: Multiple episode melena. GI consulted, had EGD10/04/18 non-bleeding erosive gastropathy. Had colonoscopy today-severe diverticulosis throughout visualized colon-1-22 mm polyp. Sent for path.  2.  ESRD: Will continue HD per TTS schedule, HD tomorrow on schedule. No heparin. 3.  Hypertension/volume: BP variable, no edema on exam. Wt today 65.1 kg.  Attempt 1.5-2 liters with HD. BP higher today but antihypertensive meds have been hold for colonoscopy. RKB  4.  Anemia: See above. Hgb down to 7, s/p 1U PRBCs. HGB 9.4 today. Follow HGB.  5.  Metabolic bone disease: Ca 6.9 C Ca 8.  Resume VDRA with HD. Parsabiv not on formulary here. 6.  Nutrition: Alb very low. Can add supps once eating. 7. A-fib: Warfarin on hold-can resume in 7 days per GI note.  8. Hx AVR  Rita H. Brown NP-C 06/25/2017, 1:32 PM  [facility] [phone]  Pt seen, examined and agree w A/P as above.  Kelly Splinter MD [employer] pager [phone]   06/25/2017, 3:01 PM

## 2017-06-25 NOTE — Op Note (Signed)
[facility] Patient Name: Angela Trujillo Procedure Date : 06/25/2017 MRN: [id number] Attending MD: Ladene Artist , MD Date of Birth: [birth year] CSN: [id number] Age: 83 Admit Type: Inpatient Procedure:                Colonoscopy Indications:              Hematochezia Providers:                Pricilla Riffle. Fuller Plan, MD, Carmie End, RN, Elspeth Cho Tech., Technician, Rebekah Chesterfield, CRNA Referring MD:             [facility] Medicines:                Monitored Anesthesia Care Complications:            No immediate complications. Estimated blood loss:                            None. Estimated Blood Loss:     Estimated blood loss: none. Procedure:                Pre-Anesthesia Assessment:                           - Prior to the procedure, a History and Physical                            was performed, and patient medications and                            allergies were reviewed. The patient's tolerance of                            previous anesthesia was also reviewed. The risks                            and benefits of the procedure and the sedation                            options and risks were discussed with the patient.                            All questions were answered, and informed consent                            was obtained. Prior Anticoagulants: The patient has                            taken Coumadin (warfarin), last dose was 2 days                            prior to procedure. ASA Grade Assessment: III - A  patient with severe systemic disease. After                            reviewing the risks and benefits, the patient was                            deemed in satisfactory condition to undergo the                            procedure.                           After obtaining informed consent, the colonoscope                            was passed under direct vision. Throughout  the                            procedure, the patient's blood pressure, pulse, and                            oxygen saturations were monitored continuously. The                            EC-[id number] ([id number]) scope was introduced through                            the anus and advanced to the the cecum, identified                            by appendiceal orifice and ileocecal valve. The                            ileocecal valve, appendiceal orifice, and rectum                            were photographed. The quality of the bowel                            preparation was fair. The colonoscopy was performed                            without difficulty. The patient tolerated the                            procedure well. Scope In: 11:08:39 AM Scope Out: 11:29:37 AM Scope Withdrawal Time: 0 hours 14 minutes 43 seconds  Total Procedure Duration: 0 hours 20 minutes 58 seconds  Findings:      The perianal and digital rectal examinations were normal.      A 22 mm polyp was found in the transverse colon. The polyp was       semi-pedunculated. The polyp was removed with a hot snare. Resection and       retrieval were complete.      Many medium-mouthed diverticula were found in  the entire colon. There       was evidence of past bleeding from the diverticular opening with several       old clots in left colon.      The exam was otherwise normal throughout the examined colon. Impression:               - Preparation of the colon was fair.                           - One 22 mm polyp in the transverse colon, removed                            with a hot snare. Resected and retrieved.                           - Severe diverticulosis in the entire examined                            colon. There was evidence of past bleeding from the                            diverticular opening. Moderate Sedation:      none/MAC Recommendation:           - Resume Coumadin (warfarin) in 7 days at prior                             dose. Refer to managing physician for further                            adjustment of therapy.                           - Patient has a contact number available for                            emergencies. The signs and symptoms of potential                            delayed complications were discussed with the                            patient. Return to normal activities tomorrow.                            Written discharge instructions were provided to the                            patient.                           - Resume previous diet.                           - Continue present medications.                           -  Await pathology results.                           - No aspirin, ibuprofen, naproxen, or other                            non-steroidal anti-inflammatory drugs for 2 weeks                            after polyp removal.                           - No repeat colonoscopy due to age. Procedure Code(s):        --- Professional ---                           [phone], Colonoscopy, flexible; with removal of                            tumor(s), polyp(s), or other lesion(s) by snare                            technique Diagnosis Code(s):        --- Professional ---                           K57.31, Diverticulosis of large intestine without                            perforation or abscess with bleeding                           D12.3, Benign neoplasm of transverse colon (hepatic                            flexure or splenic flexure)                           K92.1, Melena (includes Hematochezia) CPT copyright 2016 American Medical Association. All rights reserved. The codes documented in this report are preliminary and upon coder review may  be revised to meet current compliance requirements. Ladene Artist, MD 06/25/2017 11:35:37 AM This report has been signed electronically. Number of Addenda: 0

## 2017-06-25 NOTE — Care Management Note (Signed)
Case Management Note  Patient Details  Name: Alea Ryer MRN: [id number] Date of Birth: 1931-06-08  Subjective/Objective:  From home alone, pta indep, presents with  GIB with ABLA with supra therapeutic INR , has chronic thrombocytopenia, ESRD T,TH, Sat, DM2. Patient may benefit from pt eval.                 Action/Plan: NCM will follow for dc needs.   Expected Discharge Date:  06/28/17               Expected Discharge Plan:     In-House Referral:     Discharge planning Services  CM Consult  Post Acute Care Choice:    Choice offered to:     DME Arranged:    DME Agency:     HH Arranged:    HH Agency:     Status of Service:  In process, will continue to follow  If discussed at Long Length of Stay Meetings, dates discussed:    Additional Comments:  Zenon Mayo, RN 06/25/2017, 4:46 PM

## 2017-06-26 LAB — RENAL FUNCTION PANEL
Albumin: 2.2 g/dL — ABNORMAL LOW (ref 3.5–5.0)
Anion gap: 10 (ref 5–15)
BUN: 18 mg/dL (ref 6–20)
CO2: 25 mmol/L (ref 22–32)
Calcium: 6.9 mg/dL — ABNORMAL LOW (ref 8.9–10.3)
Chloride: 103 mmol/L (ref 101–111)
Creatinine, Ser: 5.81 mg/dL — ABNORMAL HIGH (ref 0.44–1.00)
GFR calc Af Amer: 7 mL/min — ABNORMAL LOW (ref >60)
GFR calc non Af Amer: 6 mL/min — ABNORMAL LOW (ref >60)
Glucose, Bld: 76 mg/dL (ref 65–99)
Phosphorus: 2.7 mg/dL (ref 2.5–4.6)
Potassium: 4.3 mmol/L (ref 3.5–5.1)
Sodium: 138 mmol/L (ref 135–145)

## 2017-06-26 LAB — CBC
HCT: 27.7 % — ABNORMAL LOW (ref 36.0–46.0)
HEMOGLOBIN: 9 g/dL — AB (ref 12.0–15.0)
MCH: 28.7 pg (ref 26.0–34.0)
MCHC: 32.5 g/dL (ref 30.0–36.0)
MCV: 88.2 fL (ref 78.0–100.0)
Platelets: 73 10*3/uL — ABNORMAL LOW (ref 150–400)
RBC: 3.14 MIL/uL — ABNORMAL LOW (ref 3.87–5.11)
RDW: 17.1 % — ABNORMAL HIGH (ref 11.5–15.5)
WBC: 9.1 10*3/uL (ref 4.0–10.5)

## 2017-06-26 LAB — GLUCOSE, CAPILLARY
Glucose-Capillary: 75 mg/dL (ref 65–99)
Glucose-Capillary: 176 mg/dL — ABNORMAL HIGH (ref 65–99)

## 2017-06-26 LAB — PROTIME-INR
INR: 1.07
Prothrombin Time: 13.8 seconds (ref 11.4–15.2)

## 2017-06-26 MED ORDER — SODIUM CHLORIDE 0.9 % IV SOLN: 100.0000 mL | INTRAVENOUS | Status: DC | PRN

## 2017-06-26 MED ORDER — DARBEPOETIN ALFA 100 MCG/0.5ML IJ SOSY
100.0000 ug | PREFILLED_SYRINGE | INTRAMUSCULAR | Status: DC
  Administered 2017-06-26: 100 ug via INTRAVENOUS

## 2017-06-26 MED ORDER — DOXERCALCIFEROL 4 MCG/2ML IV SOLN
INTRAVENOUS | Status: AC
  Administered 2017-06-26: 3 ug via INTRAVENOUS
  Filled 2017-06-26: qty 2

## 2017-06-26 MED ORDER — WARFARIN SODIUM 5 MG PO TABS: 5.0000 mg | ORAL_TABLET | Freq: Every day | ORAL | Status: DC

## 2017-06-26 MED ORDER — HEPARIN SODIUM (PORCINE) 1000 UNIT/ML DIALYSIS: 1000.0000 [IU] | INTRAMUSCULAR | Status: DC | PRN

## 2017-06-26 MED ORDER — DARBEPOETIN ALFA 100 MCG/0.5ML IJ SOSY
PREFILLED_SYRINGE | INTRAMUSCULAR | Status: AC
  Administered 2017-06-26: 100 ug via INTRAVENOUS
  Filled 2017-06-26: qty 0.5

## 2017-06-26 MED ORDER — LIDOCAINE HCL (PF) 1 % IJ SOLN: 5.0000 mL | INTRAMUSCULAR | Status: DC | PRN

## 2017-06-26 MED ORDER — PENTAFLUOROPROP-TETRAFLUOROETH EX AERO: 1.0000 "application " | INHALATION_SPRAY | CUTANEOUS | Status: DC | PRN

## 2017-06-26 MED ORDER — PANTOPRAZOLE SODIUM 40 MG PO TBEC: 40.0000 mg | DELAYED_RELEASE_TABLET | Freq: Every day | ORAL | 0 refills | Status: DC

## 2017-06-26 MED ORDER — LIDOCAINE-PRILOCAINE 2.5-2.5 % EX CREA: 1.0000 "application " | TOPICAL_CREAM | CUTANEOUS | Status: DC | PRN

## 2017-06-26 NOTE — Care Management Note (Signed)
Case Management Note  Patient Details  Name: Angela Trujillo MRN: [id number] Date of Birth: 08/08/1931  Subjective/Objective:    From home alone, pta indep, presents with  GIB with ABLA with supra therapeutic INR , has chronic thrombocytopenia, ESRD T,TH, Sat, DM2. Patient may benefit from pt eval.  10/6 [place] RN, BSN - per pt eval rec HHPT, son chose [facility] , referral given to Judson Roch , soc will begin 24-48 hrs post discharge. Patient is for dc today.                Action/Plan:   Expected Discharge Date:  06/26/17               Expected Discharge Plan:  [place]  In-House Referral:     Discharge planning Services  CM Consult  Post Acute Care Choice:  Home Health Choice offered to:  Adult Children  DME Arranged:    DME Agency:     HH Arranged:  PT HH Agency:  [facility]  Status of Service:  Completed, signed off  If discussed at [place] of Stay Meetings, dates discussed:    Additional Comments:  Zenon Mayo, RN 06/26/2017, 3:53 PM

## 2017-06-26 NOTE — Progress Notes (Signed)
PT Cancellation Note  Patient Details Name: Angela Trujillo MRN: [id number] DOB: 11-19-1930   Cancelled Treatment:    Reason Eval/Treat Not Completed: Patient at procedure or test/unavailable (currently in HD)   [place] 06/26/2017, 8:08 AM

## 2017-06-26 NOTE — Discharge Summary (Signed)
Physician Discharge Summary  Angela Trujillo NOB:[id number] DOB: 1931-03-03 DOA: 06/24/2017  PCP: Glendon Axe, MD  Admit date: 06/24/2017 Discharge date: 06/26/2017  Recommendations for Outpatient Follow-up:  1. Acute blood loss anemia secondary to GI bleed. Consider repeat CBC as an outpatient. Resume warfarin 10/12. 2. No NSAIDs for 2 weeks post polypectomy (10/5). 3. Started on Protonix for EGD findings as below 4. Chronic thrombocytopenia of unclear etiology and significance. Consider outpatient evaluation 5. Suspected cirrhosis by CT 08/2016 Recommend outpatient follow-up with GI. 6. Pancreatic abnormality seen on CT 08/2016. Further evaluation per gastroenterology as an outpatient.  7. Abnormal CT 08/2016: Several borderline enlarged and mildly enlarged right hilar and mediastinal lymph nodes, as above are nonspecific. In addition, there is a 7 mm subpleural nodule in the posterior aspect of the right lower lobe (image 32 of series 407). Non-contrast chest CT at 6-12 months is recommended. If the nodule is stable at time of repeat CT, then future CT at 18-24 months (from today's scan) is considered optional for low-risk patients, but is recommended for high-risk patients.  Follow-up Information    Glendon Axe, MD. Schedule an appointment as soon as possible for a visit in 1 week(s).   Specialty:  Family Medicine Contact information: [address] [place] [address] [phone]        Ladene Artist, MD. Schedule an appointment as soon as possible for a visit in 2 week(s).   Specialty:  Gastroenterology Contact information: [address] [place] [address] [phone]           Discharge Diagnoses:  1. Diverticular GI bleed 2. Acute blood loss anemia 3. Warfarin-induced coagulopathy 4. S/p bioprostehtic TAVR 5. PAF 6. ESRD 7. Chronic thrombocytopenia 8. Anemia of CKD 9. DM type 2 10. Suspected cirrhosis 11. Abnormal appearance of pancrease on  CT 12. Several borderline enlarged and mildly enlarged right hilar and mediastinal lymph nodes  Discharge Condition: improved Disposition: home  Diet recommendation: heart healthy, diabetic diet  Filed Weights   06/24/17 2117 06/25/17 0300 06/26/17 0740  Weight: 65.5 kg (144 lb 6.4 oz) 65.1 kg (143 lb 8.3 oz) 66.5 kg (146 lb 9.7 oz)    History of present illness:  83 year old woman PMH atrial fibrillation on warfarin, status post TAPVR with bioprosthetic valve, diabetes mellitus, end-stage renal disease, presented with tarry stools and weakness.admitted for GI bleedof unclear etiology, cannot remember who performed the previous colonoscopy. Because of ongoing bleeding, patient received vitamin K and then Kcentra.  Hospital Course:  Patient was admitted and seen by gastroenterology. Bleeding stopped spontaneously. She responded well to 2 units PRBC transfusionand hemoglobin has remained stable. She underwent EGD which was unremarkable. Colonoscopy notable for 1 polyp which was resected, as well as severe diverticulosis of the entire colon examined, with evidence of past bleeding. Gastroenterology recommended holding warfarin for 7 days, no NSAIDs for 2 weeks. As hemoglobin is stable and the patient is asymptomatic with no further bleeding, discharge planned today. GI review previous CT which showed suspected cirrhosis and pancreatic abnormalities which should be followed up in the outpatient setting. Individual issues as below.  Diverticular GIB with ABLA complicated by supratherapeutic INR on warfarin for afib. EGD unremarkable. Colonoscopy--one polyp resected. Severe diverticulosis entire colon with evidence of past bleeding. -Hgb stable s/p 2 units PRBC, no further bleeding -continue PPI daily y -resume warfarin in 7 days (10/12). No ASA or NSAIDs for 2 weeks s/p polyp removal -resume iron on discharge  Suspected cirrhosis, HbsAg negative;  pancreatic abnormality on CT 08/2016. F/u with GI  as an outpatient.   PAF -Continue amiodarone  S/p bioprosthetic TAVR 09/2016 -ASA previously d/c by Dr. Burt Knack, see note 06/09/2017.  -Message sent via Epic to apprise Dr. Burt Knack of hospitalization -d/w Dr. Percival Spanish, no further recommendations other than to resume anticoagulation as per GI  Chronic thrombocytopenia, complicated by acute blood loss. -Etiology of long-term thrombocytopenia unclear. Seems to be stabilizing from an acute point of view. -follow CBC  End-stage renal disease on hemodialysis Tuesday, Thursday, Saturday -s/p HD 10/4  Anemia of chronic kidney disease  Diabetes mellitus type 2 -stable -resume Toujeo on discharge  Abnormal CT 08/2016: Several borderline enlarged and mildly enlarged right hilar and mediastinal lymph nodes, as above are nonspecific. In addition, there is a 7 mm subpleural nodule in the posterior aspect of the right lower lobe (image 32 of series 407). Non-contrast chest CT at 6-12 months is recommended. If the nodule is stable at time of repeat CT, then future CT at 18-24 months (from today's scan) is considered optional for low-risk patients, but is recommended for high-risk patients.  Consultants:  GI  Procedures:  EGD Impression: - Normal esophagus. - Non-bleeding erosive gastropathy. - Patchy, erythematous mucosa with mosaic pattern  in the stomach. Biopsied. - Erythematous duodenopathy. - Normal second portion of the duodenum.  Colonoscopy  Impression: - Preparation of the colon was fair. - One 22 mm polyp in the transverse colon, removed  with a hot snare. Resected and retrieved. - Severe diverticulosis in the entire examined  colon. There was evidence of past  bleeding from the  diverticular opening.   Today's assessment: S: feels well, no pain, no bleeding. Breathing fine. O: Vitals: afebrile, VSS, 98.3, 16, 62, 165/65, 100% on RA  Constitutional:  Appears calm and comfortable, seen on HD ENMT:  grossly normal hearing  Respiratory:  CTA bilaterally, no w/r/r.  Respiratory effort normal.  Cardiovascular:  RRR, no m/r/g No LE extremity edema   Abdomen: soft, nontender, nondistended Musculoskeletal:  BLE strength and tone normal, no atrophy, no abnormal movements Psychiatric:  judgement and insight appear normal Mental status Mood, affect appropriate  I have personally reviewed the following:   Labs:  CBG stable  Hgb stable 9.0, plts stable 73  BMP c/w ESRD. K+ normal.  Discharge Instructions  Discharge Instructions    Diet - low sodium heart healthy    Complete by:  As directed    Diet Carb Modified    Complete by:  As directed    Discharge instructions    Complete by:  As directed    Call your physician or seek immediate medical attention for bleeding, pain, weakness, dizziness or worsening of condition. Restart warfarin at usual dose 10/12. No aspirin, ibuprofen, Aleve, Motrin or other NSAIDs for 2 weeks (10/19).   Increase activity slowly    Complete by:  As directed      Allergies as of 06/26/2017      Reactions   Asa [aspirin] Nausea Only, Other (See Comments)   MAKES STOMACH HURT   Codeine Rash   Penicillins Rash   Has patient had a PCN reaction causing immediate rash, facial/tongue/throat swelling, SOB or lightheadedness with hypotension:  #  #  #  NO  #  #  #  Has patient had a PCN reaction causing severe rash involving mucus membranes or skin necrosis:  #  #  #  NO  #  #  # Has patient had a PCN  reaction that required hospitalization:  #  #  #  NO  #  #  #  Has patient had a PCN reaction occurring within the last 10 years:  #  #  #  NO  #  #  #  If all answers are "NO", may  proceed with Cephalosporin      Medication List    STOP taking these medications   aspirin 81 MG EC tablet     TAKE these medications   amiodarone 200 MG tablet Commonly known as:  PACERONE Take 1 tablet (200 mg total) by mouth daily.   calcium acetate 667 MG capsule Commonly known as:  PHOSLO Take 667 mg by mouth daily with breakfast.   cloNIDine 0.1 MG tablet Commonly known as:  CATAPRES TAKE 1 TABLET BY MOUTH TWICE DAILY   ferrous sulfate 325 (65 FE) MG tablet Take 325 mg by mouth 3 (three) times daily with meals.   metoprolol succinate 100 MG 24 hr tablet Commonly known as:  TOPROL-XL Take 100 mg by mouth daily.   multivitamin Tabs tablet Take 1 tablet by mouth at bedtime.   pantoprazole 40 MG tablet Commonly known as:  PROTONIX Take 1 tablet (40 mg total) by mouth daily at 6 (six) AM.   SENSIPAR 30 MG tablet Generic drug:  cinacalcet Take 30 mg by mouth Every Tuesday,Thursday,and Saturday with dialysis.   TOUJEO SOLOSTAR Esmond Inject 10 Units into the skin at bedtime.   warfarin 5 MG tablet Commonly known as:  COUMADIN Take 1 tablet (5 mg total) by mouth daily. Resume usual dosing October 12. What changed:  how much to take  how to take this  when to take this  additional instructions      Allergies  Allergen Reactions  . Asa [Aspirin] Nausea Only and Other (See Comments)    MAKES STOMACH HURT  . Codeine Rash  . Penicillins Rash     Has patient had a PCN reaction causing immediate rash, facial/tongue/throat swelling, SOB or lightheadedness with hypotension:  #  #  #  NO  #  #  #  Has patient had a PCN reaction causing severe rash involving mucus membranes or skin necrosis:  #  #  #  NO  #  #  # Has patient had a PCN reaction that required hospitalization:  #  #  #  NO  #  #  #  Has patient had a PCN reaction occurring within the last 10 years:  #  #  #  NO  #  #  #  If all answers are "NO", may proceed with Cephalosporin    The results of  significant diagnostics from this hospitalization (including imaging, microbiology, ancillary and laboratory) are listed below for reference.    Significant Diagnostic Studies: No results found.  Microbiology: Recent Results (from the past 240 hour(s))  MRSA PCR Screening     Status: None   Collection Time: 06/24/17  9:15 PM  Result Value Ref Range Status   MRSA by PCR NEGATIVE NEGATIVE Final    Comment:        The GeneXpert MRSA Assay (FDA approved for NASAL specimens only), is one component of a comprehensive MRSA colonization surveillance program. It is not intended to diagnose MRSA infection nor to guide or monitor treatment for MRSA infections.      Labs: Basic Metabolic Panel:  Recent Labs Lab 06/24/17 0026 06/24/17 0804 06/26/17 0750  NA 137 136 138  K  4.5 4.5 4.3  CL 99* 104 103  CO2 27 24 25   GLUCOSE 176* 116* 76  BUN 22* 24* 18  CREATININE 6.57* 6.69* 5.81*  CALCIUM 7.4* 6.9* 6.9*  PHOS  --   --  2.7   Liver Function Tests:  Recent Labs Lab 06/24/17 0026 06/26/17 0750  AST 21  --   ALT 13*  --   ALKPHOS 74  --   BILITOT 0.7  --   PROT 5.2*  --   ALBUMIN 2.6* 2.2*   CBC:  Recent Labs Lab 06/24/17 1834 06/25/17 0332 06/25/17 0832 06/25/17 1521 06/26/17 0750  WBC 7.6 7.0 7.5 8.3 9.1  HGB 7.3* 9.3* 9.4* 8.8* 9.0*  HCT 22.0* 27.8* 28.1* 26.5* 27.7*  MCV 89.8 88.3 87.3 87.5 88.2  PLT 90* 71* 70* 69* 73*   CBG:  Recent Labs Lab 06/25/17 0830 06/25/17 1211 06/25/17 1706 06/25/17 2029 06/26/17 0818  GLUCAP 69 73 202* 234* 75    Principal Problem:   GIB (gastrointestinal bleeding) Active Problems:   S/P TAVR (transcatheter aortic valve replacement)   Atrial fibrillation, chronic (HCC)   Thrombocytopenia (HCC)   ESRD on hemodialysis ([place])   Anemia due to end stage renal disease (HCC)   Type II diabetes mellitus with renal manifestations (HCC)   Acute blood loss anemia   Hematochezia   Abnormal CT scan, liver   Acute lower GI  bleeding   Benign neoplasm of transverse colon   Time coordinating discharge: 35 minutes  Signed:  Murray Hodgkins, MD [facility] 06/26/2017, 10:49 AM

## 2017-06-26 NOTE — Progress Notes (Signed)
    Progress Note   Subjective   Feels well. No recurrent GI bleeding and no bowel movements since I last saw her. Tolerating diet. Current in HD.     Objective  Vital signs in last 24 hours: Temp:  [97.5 F (36.4 C)-98.4 F (36.9 C)] 98.3 F (36.8 C) (10/06 0740) Pulse Rate:  [61-77] 71 (10/06 0845) Resp:  [9-22] 16 (10/06 0748) BP: (131-182)/(43-68) 150/58 (10/06 0845) SpO2:  [97 %-100 %] 100 % (10/06 0740) Weight:  [146 lb 9.7 oz (66.5 kg)] 146 lb 9.7 oz (66.5 kg) (10/06 0740) Last BM Date: 06/24/17  General: Alert, well-developed, in NAD Heart:  Regular rate and rhythm; no murmurs Chest: Clear to ascultation bilaterally Abdomen:  Soft, nontender and nondistended. Normal bowel sounds, without guarding, and without rebound.   Extremities:  Without edema. Neurologic:  Alert and  oriented x4; grossly normal neurologically. Psych:  Alert and cooperative. Normal mood and affect.  Intake/Output from previous day: 10/05 0701 - 10/06 0700 In: 620 [P.O.:620] Out: 70 [Urine:70] Intake/Output this shift: No intake/output data recorded.  Lab Results:  Recent Labs  06/25/17 0832 06/25/17 1521 06/26/17 0750  WBC 7.5 8.3 9.1  HGB 9.4* 8.8* 9.0*  HCT 28.1* 26.5* 27.7*  PLT 70* 69* 73*   BMET  Recent Labs  06/24/17 0026 06/24/17 0804 06/26/17 0750  NA 137 136 138  K 4.5 4.5 4.3  CL 99* 104 103  CO2 27 24 25   GLUCOSE 176* 116* 76  BUN 22* 24* 18  CREATININE 6.57* 6.69* 5.81*  CALCIUM 7.4* 6.9* 6.9*   LFT  Recent Labs  06/24/17 0026 06/26/17 0750  PROT 5.2*  --   ALBUMIN 2.6* 2.2*  AST 21  --   ALT 13*  --   ALKPHOS 74  --   BILITOT 0.7  --    PT/INR  Recent Labs  06/25/17 0332 06/26/17 0453  LABPROT 14.0 13.8  INR 1.09 1.07   Hepatitis Panel  Recent Labs  06/24/17 1732  HEPBSAG Negative    Studies/Results: No results found.    Assessment & Plan   1. Hematochezia, diverticular bleed, resolved. Hb stable at 9. OK for discharge from GI  standpoint. 2. Colon polyp, removed, pathology pending. 3. Suspected cirrhosis. HbsAg negative. Further evaluation as outpatient.  4. Pancreatic abnormality on CT. Further evaluation at outpatient.  5. Anticoagulation with Coumadin. Resume Coumadin no sooner than 6 days from now.  GI signing off.     LOS: 2 days    Norberto Sorenson T. Fuller Plan MD 06/26/2017, 9:12 AM

## 2017-06-26 NOTE — Evaluation (Signed)
Physical Therapy Evaluation Patient Details Name: Angela Trujillo MRN: [id number] DOB: Sep 22, 1930 Today's Date: 06/26/2017   History of Present Illness  patient is an 83 yo female who presents with  GIB with ABLA with supra therapeutic INR , has chronic thrombocytopenia, ESRD T,TH, Sat, DM2.  Clinical Impression  Orders received for PT evaluation. Patient demonstrates modest deficits in functional mobility as indicated below. Will benefit from continued skilled PT to address deficits and maximize function, will defer these mobility needs to HHPT as patient for discharge home today.     Follow Up Recommendations Home health PT;Supervision for mobility/OOB    Equipment Recommendations  None recommended by PT    Recommendations for Other Services       Precautions / Restrictions Precautions Precautions: Fall      Mobility  Bed Mobility Overal bed mobility: Modified Independent             General bed mobility comments: increased time to perform, no physical assist required  Transfers Overall transfer level: Needs assistance Equipment used: None Transfers: Sit to/from Stand Sit to Stand: Supervision         General transfer comment: supervision for safety, no physical assist required  Ambulation/Gait Ambulation/Gait assistance: Supervision Ambulation Distance (Feet): 40 Feet Assistive device: None Gait Pattern/deviations: Step-through pattern;Decreased stride length;Shuffle;Trunk flexed Gait velocity: decreased   General Gait Details: patient only agreeable to in room ambulation, steady with mobility but does show limitation by fatigue and generalized weakness.  Stairs            Wheelchair Mobility    Modified Rankin (Stroke Patients Only)       Balance Overall balance assessment: Needs assistance Sitting-balance support: Feet supported Sitting balance-Leahy Scale: Good     Standing balance support: During functional activity Standing  balance-Leahy Scale: Fair                               Pertinent Vitals/Pain      Home Living Family/patient expects to be discharged to:: Private residence Living Arrangements: Alone Available Help at Discharge: Family;Available PRN/intermittently (overnight) Type of Home: House Home Access: Stairs to enter   Entrance Stairs-Number of Steps: 1+1 Home Layout: One level Home Equipment: Bedside commode;Walker - 4 wheels;Walker - 2 wheels;Shower seat      Prior Function Level of Independence: Independent with assistive device(s)         Comments: Patient reports no use of cane or RW for gait.     Hand Dominance   Dominant Hand: Right    Extremity/Trunk Assessment   Upper Extremity Assessment Upper Extremity Assessment: Generalized weakness    Lower Extremity Assessment Lower Extremity Assessment: Generalized weakness       Communication   Communication: HOH  Cognition Arousal/Alertness: Awake/alert Behavior During Therapy: Agitated (frustrated and angry, just wants to go home) Overall Cognitive Status: Within Functional Limits for tasks assessed                                        General Comments      Exercises     Assessment/Plan    PT Assessment All further PT needs can be met in the next venue of care  PT Problem List Decreased activity tolerance;Decreased balance;Decreased mobility;Decreased strength       PT Treatment Interventions      PT  Goals (Current goals can be found in the Care Plan section)  Acute Rehab PT Goals Patient Stated Goal: to go home  PT Goal Formulation: All assessment and education complete, DC therapy    Frequency     Barriers to discharge        Co-evaluation               AM-PAC PT "6 Clicks" Daily Activity  Outcome Measure Difficulty turning over in bed (including adjusting bedclothes, sheets and blankets)?: A Little Difficulty moving from lying on back to sitting on the  side of the bed? : A Little Difficulty sitting down on and standing up from a chair with arms (e.g., wheelchair, bedside commode, etc,.)?: A Little Help needed moving to and from a bed to chair (including a wheelchair)?: A Little Help needed walking in hospital room?: A Little Help needed climbing 3-5 steps with a railing? : A Little 6 Click Score: 18    End of Session Equipment Utilized During Treatment: Gait belt Activity Tolerance: Patient tolerated treatment well;Patient limited by fatigue (limited to in room activity only as pt frustrated want to go) Patient left: in bed;with call bell/phone within reach;with bed alarm set   PT Visit Diagnosis: Unsteadiness on feet (R26.81);Muscle weakness (generalized) (M62.81)    Time: 8864-8472 PT Time Calculation (min) (ACUTE ONLY): 16 min   Charges:   PT Evaluation $PT Eval Moderate Complexity: 1 Mod     PT G Codes:        Alben Deeds, PT DPT  Board Certified Neurologic Specialist [place] 06/26/2017, 3:36 PM

## 2017-06-26 NOTE — Progress Notes (Signed)
[facility] Progress Note   Subjective: Seen on HD. No new C/Os.   Objective Vitals:   06/26/17 0845 06/26/17 0900 06/26/17 0930 06/26/17 0945  BP: (!) 150/58 (!) 123/52 (!) 135/55 (!) 125/50  Pulse: 71 62 64 66  Resp:      Temp:      TempSrc:      SpO2:      Weight:      Height:       Physical Exam General: Pleasant, NAD Heart: S1,S2, RRR Lungs: CTAB Anteriorly Abdomen: soft, non-tender Extremities: No LE edema Dialysis Access: RUA AVG cannulated for HD   Additional Objective Labs: Basic Metabolic Panel:  Recent Labs Lab 06/24/17 0026 06/24/17 0804 06/26/17 0750  NA 137 136 138  K 4.5 4.5 4.3  CL 99* 104 103  CO2 27 24 25   GLUCOSE 176* 116* 76  BUN 22* 24* 18  CREATININE 6.57* 6.69* 5.81*  CALCIUM 7.4* 6.9* 6.9*  PHOS  --   --  2.7   Liver Function Tests:  Recent Labs Lab 06/24/17 0026 06/26/17 0750  AST 21  --   ALT 13*  --   ALKPHOS 74  --   BILITOT 0.7  --   PROT 5.2*  --   ALBUMIN 2.6* 2.2*   No results for input(s): LIPASE, AMYLASE in the last 168 hours. CBC:  Recent Labs Lab 06/24/17 1834 06/25/17 0332 06/25/17 0832 06/25/17 1521 06/26/17 0750  WBC 7.6 7.0 7.5 8.3 9.1  HGB 7.3* 9.3* 9.4* 8.8* 9.0*  HCT 22.0* 27.8* 28.1* 26.5* 27.7*  MCV 89.8 88.3 87.3 87.5 88.2  PLT 90* 71* 70* 69* 73*   Blood Culture    Component Value Date/Time   SDES BLOOD LEFT HAND 02/16/2017 2254   SPECREQUEST IN PEDIATRIC BOTTLE Blood Culture adequate volume 02/16/2017 2254   CULT NO GROWTH 5 DAYS 02/16/2017 2254   REPTSTATUS 02/22/2017 FINAL 02/16/2017 2254    Cardiac Enzymes: No results for input(s): CKTOTAL, CKMB, CKMBINDEX, TROPONINI in the last 168 hours. CBG:  Recent Labs Lab 06/25/17 0830 06/25/17 1211 06/25/17 1706 06/25/17 2029 06/26/17 0818  GLUCAP 69 73 202* 234* 75   Iron Studies: No results for input(s): IRON, TIBC, TRANSFERRIN, FERRITIN in the last 72 hours. @lablastinr3 @ Studies/Results: No results  found. Medications: . sodium chloride    . sodium chloride     . amiodarone  200 mg Oral Daily  . calcium acetate  667 mg Oral Q breakfast  . cinacalcet  30 mg Oral Q T,Th,Sa-HD  . cloNIDine  0.1 mg Oral BID  . Darbepoetin Alfa      . darbepoetin (ARANESP) injection - DIALYSIS  100 mcg Intravenous Q Sat-HD  . doxercalciferol      . doxercalciferol  3 mcg Intravenous Q T,Th,Sa-HD  . feeding supplement (PRO-STAT SUGAR FREE 64)  30 mL Oral BID  . ferrous sulfate  325 mg Oral TID WC  . insulin aspart  0-9 Units Subcutaneous TID WC  . metoprolol succinate  100 mg Oral Daily  . multivitamin  1 tablet Oral QHS  . pantoprazole  40 mg Oral Q0600     Dialysis Orders:  TTS at [facility]) 3:30hr, 180 dialyzer, BFR 350, DFR 700, AVG, EDW 63.6kg, 2K/2.5Ca, Linear Na, UF profile 2, 16g needles - Heparin 2600 bolus + 500/hr pump - Parsabiv 2.5mg  IV q HD - Hectoral 53mcg IV q HD - Mircera 220mcg IV q 2 weeks (unclear last dose) -  Hep B s AG negative 05/27/17  Assessment/Plan: 1. GI bleed:Multiple episode melena. GI consulted, had EGD10/04/18 non-bleeding erosive gastropathy. Had colonoscopy today-severe diverticulosis throughout visualized colon-1-22 mm polyp. Sent for path. No further bloody stools. OK from GI standpoint for DC.  2. ESRD: HD today on schedule. K+4.3 3. Hypertension/volume: BP variable, no edema on exam. Wt today 66.1 kg. UFG 3.5 4. Anemia: See above. Hgb down to 7, s/p 1U PRBCs. HGB 9.0 today. Give Aranesp 100 mcg IV today.  5. Metabolic bone disease: Ca 6.9 C Ca 8. Hold sensipar.  Phos low-2.7 Hold binders.  Resume VDRA with HD. Parsabiv not on formulary here. 6. Nutrition: Alb very low. Can add supps once eating. 7. A-fib:Warfarin on hold-can resume in 7 days per GI note.  8. Hx AVR  Rita H. Brown NP-C 06/26/2017, 9:58 AM  [facility] [phone]  Pt seen, examined and agree w A/P as above.  Kelly Splinter  MD [employer] pager [phone]   06/26/2017, 10:16 AM

## 2017-06-28 LAB — TYPE AND SCREEN
ABO/RH(D): O POS
Antibody Screen: NEGATIVE
UNIT DIVISION: 0
UNIT DIVISION: 0
Unit division: 0

## 2017-06-28 LAB — BPAM RBC
BLOOD PRODUCT EXPIRATION DATE: 201810182359
Blood Product Expiration Date: 201810182359
Blood Product Expiration Date: 201810292359
ISSUE DATE / TIME: 201810040938
ISSUE DATE / TIME: 201810041825
Unit Type and Rh: 5100
Unit Type and Rh: 5100
Unit Type and Rh: 5100

## 2017-06-29 ENCOUNTER — Encounter (HOSPITAL_COMMUNITY): Payer: Self-pay | Admitting: Gastroenterology

## 2017-06-30 ENCOUNTER — Telehealth: Payer: Self-pay | Admitting: Cardiovascular Disease

## 2017-06-30 NOTE — Telephone Encounter (Signed)
Pt was recently discharged from [facility] s/p GI bleed, pt is holding Coumadin at present, will resume Coumadin per GI on 07/02/17.  Per discharge instructions in Epic pt is to resume previous dosage regimen which is 2.5mg  QD except 5mg  on Tuesdays, Thursdays, and Saturdays.  Madilyn Fireman, RN [facility] verbal orders to check INR in 5 days on 07/07/17.  Will await results from [facility].

## 2017-06-30 NOTE — Telephone Encounter (Signed)
New message    If Home Health RN is calling please get Coumadin Nurse on the phone STAT  1.  Are you calling in regards to an appointment? no  2.  Are you calling for a refill ? no  3.  Are you having bleeding issues? no  4.  Do you need clearance to hold Coumadin? No  Needs orders for coumadin   Please route to the Coumadin Clinic Pool

## 2017-07-01 ENCOUNTER — Encounter: Payer: Self-pay | Admitting: Gastroenterology

## 2017-07-07 ENCOUNTER — Ambulatory Visit (INDEPENDENT_AMBULATORY_CARE_PROVIDER_SITE_OTHER): Payer: Medicare Other | Admitting: Nurse Practitioner

## 2017-07-07 ENCOUNTER — Ambulatory Visit (INDEPENDENT_AMBULATORY_CARE_PROVIDER_SITE_OTHER): Payer: Medicare Other | Admitting: Interventional Cardiology

## 2017-07-07 ENCOUNTER — Encounter: Payer: Self-pay | Admitting: Nurse Practitioner

## 2017-07-07 VITALS — BP 120/68 | HR 64 | Ht 65.0 in | Wt 146.6 lb

## 2017-07-07 DIAGNOSIS — R933 Abnormal findings on diagnostic imaging of other parts of digestive tract: Secondary | ICD-10-CM

## 2017-07-07 DIAGNOSIS — Z952 Presence of prosthetic heart valve: Secondary | ICD-10-CM

## 2017-07-07 DIAGNOSIS — K7460 Unspecified cirrhosis of liver: Secondary | ICD-10-CM

## 2017-07-07 DIAGNOSIS — K2970 Gastritis, unspecified, without bleeding: Secondary | ICD-10-CM | POA: Diagnosis not present

## 2017-07-07 DIAGNOSIS — D649 Anemia, unspecified: Secondary | ICD-10-CM | POA: Diagnosis not present

## 2017-07-07 DIAGNOSIS — I482 Chronic atrial fibrillation, unspecified: Secondary | ICD-10-CM

## 2017-07-07 DIAGNOSIS — B9681 Helicobacter pylori [H. pylori] as the cause of diseases classified elsewhere: Secondary | ICD-10-CM | POA: Diagnosis not present

## 2017-07-07 DIAGNOSIS — I483 Typical atrial flutter: Secondary | ICD-10-CM

## 2017-07-07 DIAGNOSIS — Z5181 Encounter for therapeutic drug level monitoring: Secondary | ICD-10-CM | POA: Diagnosis not present

## 2017-07-07 DIAGNOSIS — I480 Paroxysmal atrial fibrillation: Secondary | ICD-10-CM | POA: Diagnosis not present

## 2017-07-07 DIAGNOSIS — Z7901 Long term (current) use of anticoagulants: Secondary | ICD-10-CM

## 2017-07-07 DIAGNOSIS — Z8719 Personal history of other diseases of the digestive system: Secondary | ICD-10-CM

## 2017-07-07 LAB — POCT INR: INR: 1.6

## 2017-07-07 NOTE — Patient Instructions (Signed)
If you are age 81 or older, your body mass index should be between 23-30. Your Body mass index is 24.4 kg/m. If this is out of the aforementioned range listed, please consider follow up with your Primary Care Provider.  If you are age 41 or younger, your body mass index should be between 19-25. Your Body mass index is 24.4 kg/m. If this is out of the aformentioned range listed, please consider follow up with your Primary Care Provider.   We will call in antibiotics for H Pylori.  Thank you for choosing me and [facility].   Tye Savoy, NP

## 2017-07-07 NOTE — Progress Notes (Signed)
Chief Complaint:  Hospital follow up.   HPI: Patient is an 83 year old female with multiple medical problems not limited DM, HTN, hx of TAVR, AFIB,and ESRD on HD. Patient presented to the ED 06/24/17 with complaints of GI bleeding. I saw her in the ED at the time. She described dark, nearly black stool (on iron) but nurse in ED reported more fresh colored blood. There was question of cirrhosis on an CT scan done the year prior so we proceeded with an EGD to rule out bleeding from varices or portal gastropathy. EGD remarkable for a few small nonbleeding erosions in the stomach. The entire stomach was erythematous with a mosaic appearance (portal HTN?). Gastric biopsies positive for chronic active gastritis with H. Pylori. For further evaluation of the bleeding we proceeded with a colonoscopy the following day. The bowel prep was fair. A 22 mm polyp was removed from the transverse colon. Many medium mouth diverticula were seen in the entire colon and there was stigmata of recent bleeding from a diverticulum.    Pancreas: In the head and proximal body of the pancreas there are 2 well-defined low-attenuation areas measuring up to 12 x 25 mm (image 160 of series 401), which are incompletely characterized, but favored to represent pancreatic pseudocysts. Throughout the distal body and tail of the pancreas there are innumerable cystic appearing areas, which is favored to reflect extreme side branch ectasia. Atrophy of the pancreatic parenchyma throughout the distal body and tail of the pancreas. Notably, the intervening pancreatic duct is normal in caliber in the proximal body and head of the pancreas. No peripancreatic inflammatory changes   Past Medical History:  Diagnosis Date  . Atrial fibrillation with RVR ([place]) 10/22/2016  . Atrial flutter ([place])   . Diabetes mellitus with end stage renal disease ([place])   . Dyspnea    with exertion  . ESRD (end stage renal disease) on dialysis ([place])    HD on T,T, Sa  . GERD (gastroesophageal reflux disease)   . Hypertension   . Pneumonia 10/2015  . Severe aortic stenosis 08/19/2016   s/p TAVR 09/2016 // b. Echo 09/30/16: EF 50-55, inf-septal AK, Gr 2 DD, normally functioning TAVR, mean AV gradient 15 mmHg, mod MAC, mild MS, mild MR, trivial effusion    Patient's surgical history, family medical history, social history, medications and allergies were all reviewed in Epic    Physical Exam: BP 120/68   Pulse 64   Ht _0  (1.651 m)   Wt 146 lb 9.6 oz (66.5 kg)   BMI 24.40 kg/m   GENERAL:  Well developed black female in wheelchair in NAD PSYCH: :Pleasant, cooperative, normal affect EENT:  conjunctiva pink, mucous membranes moist, neck supple without masses CARDIAC:  RRR, + murmur heard PULM: Normal respiratory effort, lungs CTA bilaterally, no wheezing ABDOMEN:  lmited exam in wheelchair. Nondistended, soft, nontender, normal bowel sounds SKIN:  turgor, no lesions seen Musculoskeletal:  Normal muscle tone, normal strength NEURO: Alert and oriented x 3, no focal neurologic deficits    ASSESSMENT and PLAN:  1. Pleasant 83 year old recent hospital admission for diverticular bleed on coumadin associated with acute on chronic anemia. No further bleeding but at increased risk since since anticoagulated. She received 2 units of blood for drop in hgb from baseline of mid 8 range to 7.0.Hgb stable at 9.0 now. She is on chronic iron.   2. H pylori erosive gastritis.  -PCN allergic. Will treat with quadruple therapy. Patient  is on dialysis so I consulted with [facility] about what dose adjustments for antibiotics. None needed for flagyl. Will give tetracycline 500 mg daily (on dialysis days be sure to take in evenings AFTER dialysis).  -h.pylori fecal antigen 4 weeks after treatment (and off PPI for 2 weeks)  3. Possible cirrhosis by imaging, new. Given her advanced age and multiple comorbidities I don't think extensive  workup for etiology is necessary and family agrees. HBV, HCV negative. HBV surface ab is positive suggesting immunity. No evidence for decomensation at this time. There may have been portal gastropathy on recent upper endoscopy but certainly no varices. She's not encephalopathic. No ascites on imaging. No focal liver lesions on CTA abd in Dec 2017. Monitor for now.   4. Abnormal pancreas on CT scan in Dec 2017.  Findings favored to reflect sequela of prior episodes of pancreatitis with extensive ductal ectasia throughout the distal body and tail of the pancreas and possibly two pancreatic pseudocysts in the head and proximal body of the pancreas.  No abdominal pain. Given advanced age and asymptomatic state I did not arrange for MRI for further evaluation  5. Asymptomatic cholelithiasis.    Tye Savoy , NP 07/07/2017, 1:44 PM

## 2017-07-09 ENCOUNTER — Other Ambulatory Visit: Payer: Self-pay | Admitting: Nurse Practitioner

## 2017-07-09 ENCOUNTER — Telehealth: Payer: Self-pay

## 2017-07-09 ENCOUNTER — Other Ambulatory Visit: Payer: Self-pay

## 2017-07-09 DIAGNOSIS — B9681 Helicobacter pylori [H. pylori] as the cause of diseases classified elsewhere: Secondary | ICD-10-CM

## 2017-07-09 DIAGNOSIS — K2970 Gastritis, unspecified, without bleeding: Principal | ICD-10-CM

## 2017-07-09 MED ORDER — BISMUTH SUBSALICYLATE 262 MG PO CHEW: 524.0000 mg | CHEWABLE_TABLET | Freq: Four times a day (QID) | ORAL | 0 refills | Status: DC

## 2017-07-09 MED ORDER — TETRACYCLINE HCL 500 MG PO CAPS: 500.0000 mg | ORAL_CAPSULE | Freq: Four times a day (QID) | ORAL | 0 refills | Status: DC

## 2017-07-09 MED ORDER — OMEPRAZOLE 20 MG PO CPDR: 20.0000 mg | DELAYED_RELEASE_CAPSULE | Freq: Two times a day (BID) | ORAL | 0 refills | Status: DC

## 2017-07-09 MED ORDER — METRONIDAZOLE 250 MG PO TABS: 250.0000 mg | ORAL_TABLET | Freq: Four times a day (QID) | ORAL | 0 refills | Status: DC

## 2017-07-09 NOTE — Telephone Encounter (Signed)
Prescriptions sent.  Lab ordered for 08/23/17.  Son to advise patient.

## 2017-07-09 NOTE — Telephone Encounter (Signed)
I called and spoke with Gwyndolyn Saxon, patients son and advised that medications have been sent to pharmacy.  Patient needs to start tomorrow and to take medications as directed.  Also patient needs to have lab work done on 08/23/17 in our building.  Advised that patient should STOP Pantoprazole when she starts Omeprazole and to only take Omeprazole for 14 days  DO NOT start back on Pantoprazole after finishing Omeprazole.

## 2017-07-09 NOTE — Telephone Encounter (Signed)
-----   Message from Willia Craze, NP sent at 07/09/2017  2:37 PM EDT ----- 1) Omeprazole 20 mg 2 times a day x 14 d 2) Pepto Bismol 2 tabs (262 mg each) 4 times a day x 14 d 3) Metronidazole 250 mg 4 times a day x 14 d 4) tetracycline 500 mg daily. On dialysis days give after dialysis x 14 d  After 14 d stop omeprazole also  In 4 weeks after treatment completed do H. Pylori stool antigen - dx H. Pylori gastritis

## 2017-07-10 NOTE — Progress Notes (Signed)
Reviewed and agree with initial management plan.  Malcolm T. Stark, MD FACG 

## 2017-07-13 ENCOUNTER — Other Ambulatory Visit: Payer: Self-pay

## 2017-07-13 ENCOUNTER — Telehealth: Payer: Self-pay | Admitting: Nurse Practitioner

## 2017-07-13 MED ORDER — ONDANSETRON HCL 4 MG PO TABS: 4.0000 mg | ORAL_TABLET | Freq: Two times a day (BID) | ORAL | 0 refills | Status: DC

## 2017-07-13 NOTE — Telephone Encounter (Signed)
The patient is having nausea and lack of appetite since starting the antibiotics for treatment of H Pylori. The daughter has been able to get her to take bites of food and to drink Ginger Ale. She is willing to try an anti-emetic to a couple of days to see if this helps. If not the daughter will call us back to discuss stopping the quadruple therapy.  Orders given by Tye Savoy , NP

## 2017-07-14 ENCOUNTER — Ambulatory Visit (INDEPENDENT_AMBULATORY_CARE_PROVIDER_SITE_OTHER): Payer: Medicare Other | Admitting: Cardiology

## 2017-07-14 DIAGNOSIS — I482 Chronic atrial fibrillation, unspecified: Secondary | ICD-10-CM

## 2017-07-14 DIAGNOSIS — Z5181 Encounter for therapeutic drug level monitoring: Secondary | ICD-10-CM | POA: Diagnosis not present

## 2017-07-14 LAB — POCT INR: INR: 3.5

## 2017-07-21 ENCOUNTER — Ambulatory Visit (INDEPENDENT_AMBULATORY_CARE_PROVIDER_SITE_OTHER): Payer: Medicare Other | Admitting: Cardiology

## 2017-07-21 DIAGNOSIS — I480 Paroxysmal atrial fibrillation: Secondary | ICD-10-CM | POA: Diagnosis not present

## 2017-07-21 DIAGNOSIS — I482 Chronic atrial fibrillation, unspecified: Secondary | ICD-10-CM

## 2017-07-21 DIAGNOSIS — Z5181 Encounter for therapeutic drug level monitoring: Secondary | ICD-10-CM | POA: Diagnosis not present

## 2017-07-21 DIAGNOSIS — Z952 Presence of prosthetic heart valve: Secondary | ICD-10-CM

## 2017-07-21 LAB — POCT INR: INR: 2.8

## 2017-08-05 ENCOUNTER — Telehealth: Payer: Self-pay | Admitting: *Deleted

## 2017-08-05 NOTE — Telephone Encounter (Signed)
Mandy with [facility] called & stated that the pt was due to have an INR check & does not want to have her blood drawn for her normal INR check. Advised that the pt needs to be scheduled in the office or have a INR drawn by the [facility] RN. She conveyed this to the pt & pt states she still does not want a lab draw. Advised that she will need to come in the office otherwise. Mandy, [facility] RN called back & stated I gave HD an order to obtain a INR on the pt when she goes to HD. Advised that we do not give HD orders to obtain an INR as labs are delayed by days. HH RN is aware never to give orders to have labs drawn at [place] without getting orders from [place] to do so as HD results take more than 24 hours to return & she verbalized understanding.

## 2017-08-06 ENCOUNTER — Other Ambulatory Visit: Payer: Self-pay

## 2017-08-06 DIAGNOSIS — I350 Nonrheumatic aortic (valve) stenosis: Secondary | ICD-10-CM

## 2017-08-06 DIAGNOSIS — Z952 Presence of prosthetic heart valve: Secondary | ICD-10-CM

## 2017-08-11 ENCOUNTER — Ambulatory Visit (INDEPENDENT_AMBULATORY_CARE_PROVIDER_SITE_OTHER): Payer: Medicare Other | Admitting: Internal Medicine

## 2017-08-11 DIAGNOSIS — Z5181 Encounter for therapeutic drug level monitoring: Secondary | ICD-10-CM

## 2017-08-11 DIAGNOSIS — Z952 Presence of prosthetic heart valve: Secondary | ICD-10-CM | POA: Diagnosis not present

## 2017-08-11 DIAGNOSIS — I480 Paroxysmal atrial fibrillation: Secondary | ICD-10-CM | POA: Diagnosis not present

## 2017-08-11 DIAGNOSIS — I482 Chronic atrial fibrillation, unspecified: Secondary | ICD-10-CM

## 2017-08-11 LAB — POCT INR: INR: 2.1

## 2017-08-11 NOTE — Patient Instructions (Signed)
Spoke with Leafy Ro Nurse with [facility] while in the home with the pt and Instructed to have pt  continue 1/2 tablet daily except 1 tablet on Tuesdays, Thursdays, and Saturdays. Recheck INR in 3 weeks.  Above orders given to [place] with [facility] [place] [place]

## 2017-09-22 ENCOUNTER — Ambulatory Visit (INDEPENDENT_AMBULATORY_CARE_PROVIDER_SITE_OTHER): Payer: Medicare Other | Admitting: *Deleted

## 2017-09-22 DIAGNOSIS — I482 Chronic atrial fibrillation, unspecified: Secondary | ICD-10-CM

## 2017-09-22 DIAGNOSIS — Z5181 Encounter for therapeutic drug level monitoring: Secondary | ICD-10-CM | POA: Diagnosis not present

## 2017-09-22 DIAGNOSIS — I4891 Unspecified atrial fibrillation: Secondary | ICD-10-CM | POA: Diagnosis not present

## 2017-09-22 LAB — POCT INR: INR: 1.6

## 2017-09-22 NOTE — Patient Instructions (Signed)
Description   Today take 1 tablet, then  continue 1/2 tablet daily except 1 tablet on Tuesdays, Thursdays, and Saturdays. Recheck INR in 2 weeks.

## 2017-10-08 ENCOUNTER — Ambulatory Visit (INDEPENDENT_AMBULATORY_CARE_PROVIDER_SITE_OTHER): Payer: Medicare Other | Admitting: *Deleted

## 2017-10-08 DIAGNOSIS — Z5181 Encounter for therapeutic drug level monitoring: Secondary | ICD-10-CM

## 2017-10-08 DIAGNOSIS — I482 Chronic atrial fibrillation, unspecified: Secondary | ICD-10-CM

## 2017-10-08 DIAGNOSIS — I4891 Unspecified atrial fibrillation: Secondary | ICD-10-CM | POA: Diagnosis not present

## 2017-10-08 LAB — POCT INR: INR: 1.8

## 2017-10-08 NOTE — Patient Instructions (Signed)
Description   Today take 1 tablet, then change your dose to 1 tablet daily except 1/2 tablet on Mondays, Wednesdays and Fridays.  Recheck INR in 2 weeks.

## 2017-10-20 ENCOUNTER — Other Ambulatory Visit: Payer: Self-pay

## 2017-10-20 ENCOUNTER — Inpatient Hospital Stay (HOSPITAL_COMMUNITY)
Admission: EM | Admit: 2017-10-20 | Discharge: 2017-10-24 | DRG: 193 | Disposition: A | Discharge status: Home Health | Payer: Medicare Other | Attending: Internal Medicine | Admitting: Internal Medicine

## 2017-10-20 ENCOUNTER — Emergency Department (HOSPITAL_COMMUNITY): Payer: Medicare Other

## 2017-10-20 ENCOUNTER — Encounter (HOSPITAL_COMMUNITY): Payer: Self-pay | Admitting: Radiology

## 2017-10-20 DIAGNOSIS — Z7901 Long term (current) use of anticoagulants: Secondary | ICD-10-CM | POA: Diagnosis not present

## 2017-10-20 DIAGNOSIS — R680 Hypothermia, not associated with low environmental temperature: Secondary | ICD-10-CM | POA: Diagnosis present

## 2017-10-20 DIAGNOSIS — Z79899 Other long term (current) drug therapy: Secondary | ICD-10-CM

## 2017-10-20 DIAGNOSIS — R0603 Acute respiratory distress: Secondary | ICD-10-CM

## 2017-10-20 DIAGNOSIS — R4182 Altered mental status, unspecified: Secondary | ICD-10-CM

## 2017-10-20 DIAGNOSIS — I480 Paroxysmal atrial fibrillation: Secondary | ICD-10-CM | POA: Diagnosis present

## 2017-10-20 DIAGNOSIS — Z794 Long term (current) use of insulin: Secondary | ICD-10-CM | POA: Diagnosis not present

## 2017-10-20 DIAGNOSIS — J111 Influenza due to unidentified influenza virus with other respiratory manifestations: Secondary | ICD-10-CM | POA: Diagnosis not present

## 2017-10-20 DIAGNOSIS — L8990 Pressure ulcer of unspecified site, unspecified stage: Secondary | ICD-10-CM

## 2017-10-20 DIAGNOSIS — E875 Hyperkalemia: Secondary | ICD-10-CM

## 2017-10-20 DIAGNOSIS — Z7982 Long term (current) use of aspirin: Secondary | ICD-10-CM

## 2017-10-20 DIAGNOSIS — Z885 Allergy status to narcotic agent status: Secondary | ICD-10-CM

## 2017-10-20 DIAGNOSIS — I130 Hypertensive heart and chronic kidney disease with heart failure and stage 1 through stage 4 chronic kidney disease, or unspecified chronic kidney disease: Secondary | ICD-10-CM | POA: Diagnosis not present

## 2017-10-20 DIAGNOSIS — K219 Gastro-esophageal reflux disease without esophagitis: Secondary | ICD-10-CM | POA: Diagnosis present

## 2017-10-20 DIAGNOSIS — Z952 Presence of prosthetic heart valve: Secondary | ICD-10-CM | POA: Diagnosis not present

## 2017-10-20 DIAGNOSIS — E871 Hypo-osmolality and hyponatremia: Secondary | ICD-10-CM | POA: Diagnosis not present

## 2017-10-20 DIAGNOSIS — R195 Other fecal abnormalities: Secondary | ICD-10-CM | POA: Diagnosis not present

## 2017-10-20 DIAGNOSIS — G9341 Metabolic encephalopathy: Secondary | ICD-10-CM | POA: Diagnosis present

## 2017-10-20 DIAGNOSIS — R791 Abnormal coagulation profile: Secondary | ICD-10-CM

## 2017-10-20 DIAGNOSIS — I878 Other specified disorders of veins: Secondary | ICD-10-CM | POA: Diagnosis not present

## 2017-10-20 DIAGNOSIS — R740 Nonspecific elevation of levels of transaminase and lactic acid dehydrogenase [LDH]: Secondary | ICD-10-CM | POA: Diagnosis present

## 2017-10-20 DIAGNOSIS — Z886 Allergy status to analgesic agent status: Secondary | ICD-10-CM | POA: Diagnosis not present

## 2017-10-20 DIAGNOSIS — I132 Hypertensive heart and chronic kidney disease with heart failure and with stage 5 chronic kidney disease, or end stage renal disease: Secondary | ICD-10-CM | POA: Diagnosis present

## 2017-10-20 DIAGNOSIS — I481 Persistent atrial fibrillation: Secondary | ICD-10-CM | POA: Diagnosis present

## 2017-10-20 DIAGNOSIS — N186 End stage renal disease: Secondary | ICD-10-CM | POA: Diagnosis present

## 2017-10-20 DIAGNOSIS — I350 Nonrheumatic aortic (valve) stenosis: Secondary | ICD-10-CM | POA: Diagnosis not present

## 2017-10-20 DIAGNOSIS — Z992 Dependence on renal dialysis: Secondary | ICD-10-CM

## 2017-10-20 DIAGNOSIS — E1122 Type 2 diabetes mellitus with diabetic chronic kidney disease: Secondary | ICD-10-CM | POA: Diagnosis present

## 2017-10-20 DIAGNOSIS — R5381 Other malaise: Secondary | ICD-10-CM | POA: Diagnosis present

## 2017-10-20 DIAGNOSIS — I482 Chronic atrial fibrillation: Secondary | ICD-10-CM | POA: Diagnosis not present

## 2017-10-20 DIAGNOSIS — I5030 Unspecified diastolic (congestive) heart failure: Secondary | ICD-10-CM | POA: Diagnosis present

## 2017-10-20 DIAGNOSIS — D631 Anemia in chronic kidney disease: Secondary | ICD-10-CM | POA: Diagnosis present

## 2017-10-20 DIAGNOSIS — K922 Gastrointestinal hemorrhage, unspecified: Secondary | ICD-10-CM

## 2017-10-20 DIAGNOSIS — E872 Acidosis, unspecified: Secondary | ICD-10-CM

## 2017-10-20 DIAGNOSIS — J101 Influenza due to other identified influenza virus with other respiratory manifestations: Secondary | ICD-10-CM | POA: Diagnosis present

## 2017-10-20 DIAGNOSIS — D696 Thrombocytopenia, unspecified: Secondary | ICD-10-CM | POA: Diagnosis present

## 2017-10-20 DIAGNOSIS — G9340 Encephalopathy, unspecified: Secondary | ICD-10-CM | POA: Diagnosis not present

## 2017-10-20 DIAGNOSIS — Z880 Allergy status to penicillin: Secondary | ICD-10-CM

## 2017-10-20 LAB — COMPREHENSIVE METABOLIC PANEL
ALBUMIN: 3.1 g/dL — AB (ref 3.5–5.0)
ALT: 45 U/L (ref 14–54)
AST: 112 U/L — ABNORMAL HIGH (ref 15–41)
Alkaline Phosphatase: 172 U/L — ABNORMAL HIGH (ref 38–126)
Anion gap: 23 — ABNORMAL HIGH (ref 5–15)
BUN: 57 mg/dL — ABNORMAL HIGH (ref 6–20)
CHLORIDE: 97 mmol/L — AB (ref 101–111)
CO2: 14 mmol/L — AB (ref 22–32)
Calcium: 8.9 mg/dL (ref 8.9–10.3)
Creatinine, Ser: 9.86 mg/dL — ABNORMAL HIGH (ref 0.44–1.00)
GFR calc Af Amer: 4 mL/min — ABNORMAL LOW (ref >60)
GFR, EST NON AFRICAN AMERICAN: 3 mL/min — AB (ref >60)
Glucose, Bld: 205 mg/dL — ABNORMAL HIGH (ref 65–99)
POTASSIUM: 6.6 mmol/L — AB (ref 3.5–5.1)
SODIUM: 134 mmol/L — AB (ref 135–145)
Total Bilirubin: 1.2 mg/dL (ref 0.3–1.2)
Total Protein: 6.9 g/dL (ref 6.5–8.1)

## 2017-10-20 LAB — CBC
HEMATOCRIT: 35.6 % — AB (ref 36.0–46.0)
Hemoglobin: 11.6 g/dL — ABNORMAL LOW (ref 12.0–15.0)
MCH: 29.4 pg (ref 26.0–34.0)
MCHC: 32.6 g/dL (ref 30.0–36.0)
MCV: 90.4 fL (ref 78.0–100.0)
Platelets: 116 10*3/uL — ABNORMAL LOW (ref 150–400)
RBC: 3.94 MIL/uL (ref 3.87–5.11)
RDW: 17.4 % — AB (ref 11.5–15.5)
WBC: 11.3 10*3/uL — AB (ref 4.0–10.5)

## 2017-10-20 LAB — TYPE AND SCREEN
ABO/RH(D): O POS
Antibody Screen: NEGATIVE

## 2017-10-20 LAB — I-STAT ARTERIAL BLOOD GAS, ED
Acid-base deficit: 5 mmol/L — ABNORMAL HIGH (ref 0.0–2.0)
Bicarbonate: 18.4 mmol/L — ABNORMAL LOW (ref 20.0–28.0)
O2 Saturation: 97 %
PH ART: 7.394 (ref 7.350–7.450)
TCO2: 19 mmol/L — AB (ref 22–32)
pCO2 arterial: 30.1 mmHg — ABNORMAL LOW (ref 32.0–48.0)
pO2, Arterial: 90 mmHg (ref 83.0–108.0)

## 2017-10-20 LAB — I-STAT CHEM 8, ED
BUN: 61 mg/dL — ABNORMAL HIGH (ref 6–20)
CALCIUM ION: 0.93 mmol/L — AB (ref 1.15–1.40)
CREATININE: 10.4 mg/dL — AB (ref 0.44–1.00)
Chloride: 103 mmol/L (ref 101–111)
GLUCOSE: 211 mg/dL — AB (ref 65–99)
HCT: 39 % (ref 36.0–46.0)
Hemoglobin: 13.3 g/dL (ref 12.0–15.0)
POTASSIUM: 6.5 mmol/L — AB (ref 3.5–5.1)
Sodium: 133 mmol/L — ABNORMAL LOW (ref 135–145)
TCO2: 19 mmol/L — ABNORMAL LOW (ref 22–32)

## 2017-10-20 LAB — BRAIN NATRIURETIC PEPTIDE: B Natriuretic Peptide: >4500 pg/mL — ABNORMAL HIGH (ref 0.0–100.0)

## 2017-10-20 LAB — I-STAT CG4 LACTIC ACID, ED: LACTIC ACID, VENOUS: 5.56 mmol/L — AB (ref 0.5–1.9)

## 2017-10-20 LAB — I-STAT TROPONIN, ED: Troponin i, poc: 0.07 ng/mL (ref 0.00–0.08)

## 2017-10-20 LAB — PROTIME-INR
INR: 3.15
PROTHROMBIN TIME: 32.1 s — AB (ref 11.4–15.2)

## 2017-10-20 LAB — AMMONIA: Ammonia: 61 umol/L — ABNORMAL HIGH (ref 9–35)

## 2017-10-20 LAB — CBG MONITORING, ED: Glucose-Capillary: 204 mg/dL — ABNORMAL HIGH (ref 65–99)

## 2017-10-20 LAB — POC OCCULT BLOOD, ED: Fecal Occult Bld: POSITIVE — AB

## 2017-10-20 LAB — LIPASE, BLOOD: Lipase: 29 U/L (ref 11–51)

## 2017-10-20 MED ORDER — VANCOMYCIN HCL 10 G IV SOLR
1500.0000 mg | Freq: Once | INTRAVENOUS | Status: DC
  Filled 2017-10-20: qty 1500

## 2017-10-20 MED ORDER — RENA-VITE PO TABS
1.0000 | ORAL_TABLET | Freq: Every day | ORAL | Status: DC
  Administered 2017-10-22 – 2017-10-23 (×2): 1 via ORAL
  Filled 2017-10-20 (×2): qty 1

## 2017-10-20 MED ORDER — HEPARIN SODIUM (PORCINE) 5000 UNIT/ML IJ SOLN: 5000.0000 [IU] | Freq: Three times a day (TID) | INTRAMUSCULAR | Status: DC

## 2017-10-20 MED ORDER — DEXTROSE 5 % IV SOLN
500.0000 mg | Freq: Two times a day (BID) | INTRAVENOUS | Status: DC
  Administered 2017-10-21: 500 mg via INTRAVENOUS
  Filled 2017-10-20 (×2): qty 0.5

## 2017-10-20 MED ORDER — FERROUS SULFATE 325 (65 FE) MG PO TABS
325.0000 mg | ORAL_TABLET | Freq: Two times a day (BID) | ORAL | Status: DC
  Administered 2017-10-21 – 2017-10-24 (×6): 325 mg via ORAL
  Filled 2017-10-20 (×6): qty 1

## 2017-10-20 MED ORDER — CALCIUM GLUCONATE 10 % IV SOLN
1.0000 g | Freq: Once | INTRAVENOUS | Status: AC
Start: 2017-10-20 — End: 2017-10-20
  Administered 2017-10-20: 1 g via INTRAVENOUS
  Filled 2017-10-20: qty 10

## 2017-10-20 MED ORDER — VANCOMYCIN HCL 10 G IV SOLR
1500.0000 mg | Freq: Once | INTRAVENOUS | Status: AC
  Administered 2017-10-20: 1500 mg via INTRAVENOUS
  Filled 2017-10-20 (×4): qty 1500

## 2017-10-20 MED ORDER — INSULIN ASPART 100 UNIT/ML IV SOLN
10.0000 [IU] | Freq: Once | INTRAVENOUS | Status: DC
  Filled 2017-10-20: qty 0.1

## 2017-10-20 MED ORDER — ASPIRIN 81 MG PO CHEW
81.0000 mg | CHEWABLE_TABLET | Freq: Every day | ORAL | Status: DC
  Administered 2017-10-21 – 2017-10-22 (×2): 81 mg via ORAL
  Filled 2017-10-20 (×2): qty 1

## 2017-10-20 MED ORDER — ALBUTEROL SULFATE (2.5 MG/3ML) 0.083% IN NEBU
5.0000 mg | INHALATION_SOLUTION | Freq: Once | RESPIRATORY_TRACT | Status: AC
  Administered 2017-10-20: 5 mg via RESPIRATORY_TRACT
  Filled 2017-10-20: qty 6

## 2017-10-20 MED ORDER — INSULIN ASPART 100 UNIT/ML IV SOLN
10.0000 [IU] | Freq: Once | INTRAVENOUS | Status: AC
  Administered 2017-10-20: 10 [IU] via INTRAVENOUS

## 2017-10-20 MED ORDER — IPRATROPIUM-ALBUTEROL 0.5-2.5 (3) MG/3ML IN SOLN
3.0000 mL | RESPIRATORY_TRACT | Status: DC | PRN
  Administered 2017-10-21 (×2): 3 mL via RESPIRATORY_TRACT
  Filled 2017-10-20 (×2): qty 3

## 2017-10-20 MED ORDER — DEXTROSE 50 % IV SOLN
1.0000 | Freq: Once | INTRAVENOUS | Status: AC
  Administered 2017-10-20: 50 mL via INTRAVENOUS
  Filled 2017-10-20: qty 50

## 2017-10-20 MED ORDER — CALCIUM ACETATE (PHOS BINDER) 667 MG PO CAPS
667.0000 mg | ORAL_CAPSULE | Freq: Every day | ORAL | Status: DC
  Administered 2017-10-21 – 2017-10-24 (×3): 667 mg via ORAL
  Filled 2017-10-20 (×3): qty 1

## 2017-10-20 MED ORDER — DEXTROSE 10 % IV SOLN: Freq: Once | INTRAVENOUS | Status: DC

## 2017-10-20 NOTE — Procedures (Signed)
I have personally attended this patient's dialysis session. Initiating HD via  R upper arm AVF 1K bath for an hour then 2K (K 6.6) 3 liter goal Remains on BIPAP and somnolent  Jamal Maes, MD [facility] [phone] Pager 10/20/2017, 6:42 PM

## 2017-10-20 NOTE — Progress Notes (Signed)
RT NOTE:  Pt titrated from BIPAP to 6L [place]. Pt tolerating well @ this time. Vitals: HR 67, RR: 20, SpO2 98%. BIPAP available @ bedside. RT will monitor.

## 2017-10-20 NOTE — Progress Notes (Signed)
ANTICOAGULATION CONSULT NOTE - Initial Consult  Pharmacy Consult for Warfarin Indication: atrial fibrillation  Allergies  Allergen Reactions  . Asa [Aspirin] Nausea Only and Other (See Comments)    MAKES STOMACH HURT  . Codeine Rash  . Penicillins Rash     Has patient had a PCN reaction causing immediate rash, facial/tongue/throat swelling, SOB or lightheadedness with hypotension:  #  #  #  NO  #  #  #  Has patient had a PCN reaction causing severe rash involving mucus membranes or skin necrosis:  #  #  #  NO  #  #  # Has patient had a PCN reaction that required hospitalization:  #  #  #  NO  #  #  #  Has patient had a PCN reaction occurring within the last 10 years:  #  #  #  NO  #  #  #  If all answers are "NO", may proceed with Cephalosporin   Vital Signs: Temp: 98.1 F (36.7 C) (01/30 1459) Temp Source: Rectal (01/30 1459) BP: 159/65 (01/30 1815) Pulse Rate: 58 (01/30 1815)  Labs: Recent Labs    10/20/17 1520 10/20/17 1530  HGB 11.6* 13.3  HCT 35.6* 39.0  PLT 116*  --   LABPROT 32.1*  --   INR 3.15  --   CREATININE 9.86* 10.40*    CrCl cannot be calculated (Unknown ideal weight.).   Medical History: Past Medical History:  Diagnosis Date  . Atrial fibrillation with RVR ([place]) 10/22/2016  . Atrial flutter ([place])   . Diabetes mellitus with end stage renal disease ([place])   . Dyspnea    with exertion  . ESRD (end stage renal disease) on dialysis ([place])    HD on T,T, Sa  . GERD (gastroesophageal reflux disease)   . Hypertension   . Pneumonia 10/2015  . Severe aortic stenosis 08/19/2016   s/p TAVR 09/2016 // b. Echo 09/30/16: EF 50-55, inf-septal AK, Gr 2 DD, normally functioning TAVR, mean AV gradient 15 mmHg, mod MAC, mild MS, mild MR, trivial effusion    Medications:  Warfarin PTA 2.36m daily except 5637mTu/Th/Sat (last dose was 37m36mn 1/29)  Assessment: 86yof presented to ED for AMS. Of note, pt had decreased PO intake for the past several days. Pharmacy to dose  warfarin PTA for Afib. Supratherapeutic INR today at 3.15. Hgb wnl. Low Plt at 116. No bleeding noted.  Goal of Therapy:  INR 2-3 Monitor platelets by anticoagulation protocol: Yes   Plan:  Hold warfarin x1 today Daily INR and CBC  Eilyn Polack 10/20/2017,6:33 PM

## 2017-10-20 NOTE — Progress Notes (Deleted)
Date: 10/20/2017               Patient Name:  Angela Trujillo MRN: [id number]  DOB: 08-16-1931 Age / Sex: 84 y.o., female   PCP: Glendon Axe, MD         Medical Service: Internal Medicine Teaching Service         Attending Physician: Dr. Aldine Contes, MD    First Contact: Dr. [name] Pager: [phone]  Second Contact: Dr. Hetty Ely Pager: [phone]       After Hours (After 5p/  First Contact Pager: [phone]  weekends / holidays): Second Contact Pager: [phone]   Chief Complaint: Altered mental status   History of Present Illness:  Angela Trujillo is an 84 year old female with history of ESRD on HD T/TH/S, insulin-dependent T2 DM, Afib on Coumadin, HTN, and severe aortic stenosis s/p TAVR on 09/29/2016 who presents to the ED with a 2-3-day history of altered mental status patient is very lethargic and fatigued and unable to provide history.  Family at bedside.  Per son, patient was in usual state of health until Monday night when she expressed feeling tired and sleepy.  The next day patient continued to feel fatigued and had decreased p.o. intake and nausea.  She was also noted to dry heave several times throughout the day.  She missed HD on 1/29 due to dizziness.  Her last HD session was Saturday 1/26. Dry weight ~140 lbs. This morning patient was very lethargic and somnolent.  She was unable to eat breakfast and could not dress herself.  She lives at home with daughter and independent (unable to cope but able to perform all of her ADLs).  Family proceeded to call EMS. Per family, patient has not had fever, chills, chest pain, shortness of breath, cough, abdominal pain, emesis, urinary symptoms (makes small amount of urine), and changes in bowel movements. She does have dark stools since starting iron therapy for anemia. Received influenza vaccination in HD, but did not receive pneumococcal vaccine.   ED course: Patient was mildly hypothermic (T 97.5), HR 65, BP 176/66, and oxygenating well on 2L  of supplemental oxygen. She was on BiPAP when seen, though unclear why as there is no documented hypoxia. Labwork remarkable for WBC 11.3, Hgb 11.6, K 6.5, bicarb 14, Cr 9.9 and positive FOBT. INR 3.1. I-stat troponin negative. ABG 7.39/30/90/18. EKG ?SR without signs of ischemia. Head CT negative. CXR negative for acute processes. She received albuterol nebs, Novolog 10, and calcium gluconate x1.   Review of Systems: A complete ROS was negative except as per HPI.   Meds:  Current Meds  Medication Sig  . amiodarone (PACERONE) 200 MG tablet Take 1 tablet (200 mg total) by mouth daily. (Patient taking differently: Take 200 mg by mouth at bedtime. )  . aspirin 81 MG chewable tablet Chew 81 mg by mouth daily.  . calcium acetate (PHOSLO) 667 MG capsule Take 667 mg by mouth daily with breakfast.  . cloNIDine (CATAPRES) 0.1 MG tablet TAKE 1 TABLET BY MOUTH TWICE DAILY (Patient taking differently: TAKE 0.1 mg TABLET BY MOUTH evening)  . ferrous sulfate 325 (65 FE) MG tablet Take 325 mg by mouth 2 (two) times daily with a meal.   . Insulin Glargine (TOUJEO SOLOSTAR [place]) Inject 10 Units into the skin at bedtime.  . metoprolol succinate (TOPROL-XL) 100 MG 24 hr tablet Take 100 mg by mouth daily.  . multivitamin (RENA-VIT) TABS tablet Take 1 tablet by mouth at bedtime.  Marland Kitchen  ondansetron (ZOFRAN) 4 MG tablet Take 1 tablet (4 mg total) by mouth 2 (two) times daily. Take if needed for nausea or vomiting  . SENSIPAR 30 MG tablet Take 30 mg by mouth Every Tuesday,Thursday,and Saturday with dialysis.   Marland Kitchen warfarin (COUMADIN) 5 MG tablet Take 1 tablet (5 mg total) by mouth daily. Resume usual dosing October 12. (Patient taking differently: Take 5 mg by mouth See admin instructions. 5 mg on Tues, thurs and Saturday Take 2.5 mg on Sun. Mon. Wed. Friday)     Allergies: Allergies as of 10/20/2017 - Review Complete 10/20/2017  Allergen Reaction Noted  . Asa [aspirin] Nausea Only and Other (See Comments) 04/14/2013  .  Codeine Rash 04/14/2013  . Penicillins Rash 04/14/2013   Past Medical History:  Diagnosis Date  . Atrial fibrillation with RVR ([place]) 10/22/2016  . Atrial flutter ([place])   . Diabetes mellitus with end stage renal disease ([place])   . Dyspnea    with exertion  . ESRD (end stage renal disease) on dialysis ([place])    HD on T,T, Sa  . GERD (gastroesophageal reflux disease)   . Hypertension   . Pneumonia 10/2015  . Severe aortic stenosis 08/19/2016   s/p TAVR 09/2016 // b. Echo 09/30/16: EF 50-55, inf-septal AK, Gr 2 DD, normally functioning TAVR, mean AV gradient 15 mmHg, mod MAC, mild MS, mild MR, trivial effusion    Family History:  Family History  Problem Relation Age of Onset  . CAD Neg Hx     Social History:  Social History   Tobacco Use  . Smoking status: Never Smoker  . Smokeless tobacco: Never Used  Substance Use Topics  . Alcohol use: No  . Drug use: No     Physical Exam: Blood pressure (!) 159/65, pulse (!) 58, temperature 98.1 F (36.7 C), temperature source Rectal, resp. rate 17, SpO2 (!) 87 %.  Physical Exam  Constitutional: She appears well-developed.  Obese, somnolent, on BiPAP in no respiratory distress   HENT:  Head: Normocephalic and atraumatic.  Eyes: Conjunctivae and EOM are normal. Pupils are equal, round, and reactive to light.  Neck: Normal range of motion. Neck supple.  JVD difficult to assess (wearing BiPAP mask)  Cardiovascular:  Distant heart sounds  Pulmonary/Chest:  Diffuse wheezes, no crackles, comfortable on BiPAP  Abdominal: Soft. Bowel sounds are normal. She exhibits no distension. There is no tenderness. There is no guarding.  Musculoskeletal:  Extremities are cool to the touch, 1+ LE pitting edema bilaterally, 2+ DP pulses bilaterally, RUE AVG with palpable thrill and without signs of infection  Neurological:  Patient is somnolent but easily arousable to voice, oriented x3 and able to follow commands   Skin:  No rashes, lesions, or  pressure injuries noted     EKG: personally reviewed my interpretation is ?SR without signs of ischemia, PR, QRS and QTc prolongation   CXR: personally reviewed my interpretation is enlarged cardiac silhouette, aortic arch sclerosis, no opacities or effusions noted   Assessment & Plan by Problem: Active Problems:   Encephalopathy  # Encephalopathy: Patient presented with 2-3-day history of somnolence, nausea, and poor PO intake. Unclear etiology of AMS at this time. CXR and head CT negative ?Metabolic encephalopathy vs sepsis (unclear source of infection). Missed HD yesterday. However symptom onset was on day of last HD session, which argues against uremia. She appears lethargic on exam and is on BiPAP though no documented hypoxia. Will wean as tolerated. Denies symptoms concerning for infection and no  signs of infection on exam. Hyperkalemic on admission with prolonged intervals on EKG, though would not expect changes in mentation with this unless patient were having a cardiac arrhythmia from this which is unlikely given she is hemodynamically stable.  - Continue vancomycin and aztreonam pending culture  - On BiPAP, wean as tolerated  - Repeat EKG  - Follow up Blood and urine cultures  - Emergent HD today for hyperkalemia     # Afib Patient is currently on Coumadin for persistent Afib and s/p TAVR. Her INR goal is 2-3 and she was found to have INR 3.1 on admission (at goal). Her FOBT is positive, however family reports no changes in bowel movement. She does have dark stools at baseline since starting iron therapy. She does have a history of H pylori treated in 06/2017. Will continue to monitor for signs/symptoms of active bleeding.  - Continue ferrous sulfate 325 mg QD  - Holding home Coumadin   # ESRD # HTN Patient was hypervolemic on presentation and hypertensive. K 6.5 with prolonged intervals on EKG changes. S/p calcium gluconate x1. Nephrology consulted in the ED who recommended  emergent dialysis. On clonidine 0.1 BID and compliant. Took medications this morning. Expect BP to improve with HD. Will continue to monitor BP and volume status.  - Nephrology following, appreciate recommendations - emergent HD today  - On telemetry  - On BiPAP, wean as tolerated  - Continue ferrous sulfate 325 mg QD  - Continue home Phoslo 667 mg QD   # Insulin-dependent T2DM: A1c 8.5 09/2016. On Toujeo 10 units QHS.  - s/p Novolog 10 units in the ED for hyperkalemia  - Holding insulin, may start SSI-S if hyperglycemic   # HFpEF  # Severe aortic stenosis: s/p TAVR 09/2016 with INR goal 2-3 TTE 10/2016 with EF 55 %, no wall motion abnormalities, and G2DD. Appears hypervolemic on exam. Going for emergent HD today. INR at goal.  - Continue ASA 81 mg QD   F: none  E: monitoring  N: NPO while on BiPAP --> Renal+ CM  Diet  VTE ppx: None   Code status: Full code, confirmed with family on admission    Dispo: Admit patient to Inpatient with expected length of stay greater than 2 midnights.  SignedWelford Roche, MD 10/20/2017, 6:38 PM  Pager: [phone]

## 2017-10-20 NOTE — Progress Notes (Signed)
HD tx completed @ 2234 w/o problem, UF goal met, blood rinsed back, VSS w/ increased BP, attempted to call report to primary nurse but they are not available at this time, awaiting call back

## 2017-10-20 NOTE — ED Provider Notes (Signed)
[facility] EMERGENCY DEPARTMENT Provider Note   CSN: [id number] Arrival date & time: 10/20/17  1442     History   Chief Complaint Chief Complaint  Patient presents with  . Altered Mental Status   Level 5 caveat due to altered mental status  HPI Angela Trujillo is a 84 y.o. female BIB  EMS for altered mental status.  She is attended by her friend and daughter who are at the bedside.  Patient has a past medical history of diabetes and end-stage renal disease.  Her daughter states that she has missed dialysis on Monday and was supposed to go today so she has not dialyzed since Friday, 10/15/2017.  The family states that the patient has been feeling poorly over the past 2 days with labored breathing, confusion.  She has not been running fevers.  She does not make urine.  She has been laying in bed and groaning, open mouth breathing.  Patient denies any pain and does not feel she is having difficulty breathing.  She is otherwise unable to provide any significant history.    HPI  Past Medical History:  Diagnosis Date  . Atrial fibrillation with RVR ([place]) 10/22/2016  . Atrial flutter ([place])   . Diabetes mellitus with end stage renal disease ([place])   . Dyspnea    with exertion  . ESRD (end stage renal disease) on dialysis ([place])    HD on T,T, Sa  . GERD (gastroesophageal reflux disease)   . Hypertension   . Pneumonia 10/2015  . Severe aortic stenosis 08/19/2016   s/p TAVR 09/2016 // b. Echo 09/30/16: EF 50-55, inf-septal AK, Gr 2 DD, normally functioning TAVR, mean AV gradient 15 mmHg, mod MAC, mild MS, mild MR, trivial effusion    Patient Active Problem List   Diagnosis Date Noted  . Acute lower GI bleeding   . Benign neoplasm of transverse colon   . GIB (gastrointestinal bleeding) 06/24/2017  . Anemia due to end stage renal disease ([place]) 06/24/2017  . Type II diabetes mellitus with renal manifestations ([place]) 06/24/2017  . Acute blood loss anemia 06/24/2017  .  Anemia associated with chronic renal failure   . Anticoagulated on warfarin   . Hematochezia   . Abnormal CT scan, liver   . ESRD on hemodialysis ([place])   . Respiratory distress 02/16/2017  . Acute respiratory failure with hypoxia ([place])   . Acute hyperkalemia   . Hypervolemia   . Sepsis ([place])   . Elevated lactic acid level   . PAF (paroxysmal atrial fibrillation) ([place])   . Chronic anticoagulation   . Transaminitis   . Encounter for therapeutic drug monitoring 10/23/2016  . Atrial fibrillation, chronic ([place]) 10/22/2016  . Thrombocytopenia ([place]) 10/22/2016  . Elevated troponin 10/22/2016  . S/P TAVR (transcatheter aortic valve replacement) 10/04/2016  . Hypoglycemia 10/04/2016  . ESRD on dialysis ([place]) 10/04/2016  . Chronic diastolic CHF (congestive heart failure) ([place]) 10/04/2016  . Altered mental status   . Chronic periodontitis 09/23/2016  . Atrial flutter ([place])   . Normocytic anemia 04/15/2013  . Acute renal failure ([place]) 04/15/2013  . HTN (hypertension) 04/15/2013  . Diabetes mellitus, type II, insulin dependent ([place]) 04/15/2013    Past Surgical History:  Procedure Laterality Date  . ABDOMINAL HYSTERECTOMY    . CARDIAC CATHETERIZATION N/A 08/19/2016   Procedure: Right/Left Heart Cath and Coronary Angiography;  Surgeon: Sherren Mocha, MD;  Location: [facility] CV LAB;  Service: Cardiovascular;  Laterality: N/A;  . COLONOSCOPY    .  COLONOSCOPY N/A 06/25/2017   Procedure: COLONOSCOPY;  Surgeon: Ladene Artist, MD;  Location: [facility] ENDOSCOPY;  Service: Endoscopy;  Laterality: N/A;  . ESOPHAGOGASTRODUODENOSCOPY N/A 06/24/2017   Procedure: ESOPHAGOGASTRODUODENOSCOPY (EGD);  Surgeon: Ladene Artist, MD;  Location: [facility] ENDOSCOPY;  Service: Endoscopy;  Laterality: N/A;  . IR GENERIC HISTORICAL  08/31/2016   IR RADIOLOGY PERIPHERAL GUIDED IV START 08/31/2016 Corrie Mckusick, DO [facility]-INTER[facility] RAD  . IR GENERIC HISTORICAL  08/31/2016   IR US GUIDE VASC ACCESS RIGHT 08/31/2016 Corrie Mckusick,  DO [facility]  . MULTIPLE EXTRACTIONS WITH ALVEOLOPLASTY N/A 09/25/2016   Procedure: EXTRACTION of tooth number 31 WITH ALVEOLOPLASTY AND GROSS DEBRIDEMENT OF REMAINING TEETH;  Surgeon: Lenn Cal, DDS;  Location: [facility];  Service: Oral Surgery;  Laterality: N/A;  . TEE WITHOUT CARDIOVERSION N/A 09/29/2016   Procedure: TRANSESOPHAGEAL ECHOCARDIOGRAM (TEE);  Surgeon: Sherren Mocha, MD;  Location: [facility];  Service: Open Heart Surgery;  Laterality: N/A;  . TRANSCATHETER AORTIC VALVE REPLACEMENT, TRANSFEMORAL N/A 09/29/2016   Procedure: TRANSCATHETER AORTIC VALVE REPLACEMENT, TRANSFEMORAL;  Surgeon: Sherren Mocha, MD;  Location: [facility];  Service: Open Heart Surgery;  Laterality: N/A;  . TUBAL LIGATION      OB History    No data available       Home Medications    Prior to Admission medications   Medication Sig Start Date End Date Taking? Authorizing Provider  amiodarone (PACERONE) 200 MG tablet Take 1 tablet (200 mg total) by mouth daily. 11/27/16   Sherren Mocha, MD  bismuth subsalicylate (PEPTO-BISMOL) 262 MG chewable tablet Chew 2 tablets (524 mg total) by mouth 4 (four) times daily. Take for 14 days only. 07/09/17   Willia Craze, NP  calcium acetate (PHOSLO) 667 MG capsule Take 667 mg by mouth daily with breakfast.    [provider]  cloNIDine (CATAPRES) 0.1 MG tablet TAKE 1 TABLET BY MOUTH TWICE DAILY 05/13/17   Richardson Dopp T, PA-C  ferrous sulfate 325 (65 FE) MG tablet Take 325 mg by mouth 3 (three) times daily with meals.     [provider]  Insulin Glargine (TOUJEO SOLOSTAR [place]) Inject 10 Units into the skin at bedtime.    [provider]  metoprolol succinate (TOPROL-XL) 100 MG 24 hr tablet Take 100 mg by mouth daily. 06/29/16   [provider]  metroNIDAZOLE (FLAGYL) 250 MG tablet Take 1 tablet (250 mg total) by mouth 4 (four) times daily. Take for 14 days only 07/09/17   Willia Craze, NP  multivitamin (RENA-VIT) TABS tablet Take 1  tablet by mouth at bedtime. 10/04/16   Richardson Dopp T, PA-C  omeprazole (PRILOSEC) 20 MG capsule Take 1 capsule (20 mg total) by mouth 2 (two) times daily before a meal. Take for 14 days only. 07/09/17   Willia Craze, NP  ondansetron (ZOFRAN) 4 MG tablet Take 1 tablet (4 mg total) by mouth 2 (two) times daily. Take if needed for nausea or vomiting 07/13/17   Willia Craze, NP  pantoprazole (PROTONIX) 40 MG tablet Take 1 tablet (40 mg total) by mouth daily at 6 (six) AM. 06/27/17   Samuella Cota, MD  SENSIPAR 30 MG tablet Take 30 mg by mouth Every Tuesday,Thursday,and Saturday with dialysis.  08/06/16   [provider]  tetracycline (ACHROMYCIN,SUMYCIN) 500 MG capsule Take 1 capsule (500 mg total) by mouth 4 (four) times daily. For 14 days only.  On DIALYSIS DAYS give AFTER dialysis. 07/09/17   Willia Craze, NP  warfarin (COUMADIN) 5 MG tablet Take 1 tablet (5 mg total) by mouth daily. Resume usual dosing October 12. 06/26/17   Samuella Cota, MD    Family History Family History  Problem Relation Age of Onset  . CAD Neg Hx     Social History Social History   Tobacco Use  . Smoking status: Never Smoker  . Smokeless tobacco: Never Used  Substance Use Topics  . Alcohol use: No  . Drug use: No     Allergies   Asa [aspirin]; Codeine; and Penicillins   Review of Systems Review of Systems  Unable to perform ROS: Mental status change     Physical Exam Updated Vital Signs BP (!) 176/66 (BP Location: Left Arm)   Pulse 65   Temp 98.1 F (36.7 C) (Rectal)   Resp 18   SpO2 95%   Physical Exam  Constitutional: She appears well-developed and well-nourished. She appears lethargic. She appears distressed.  Lying on examing bed, eyes closed, breathing with open mouth, tachypnea  HENT:  Head: Normocephalic and atraumatic.  Dry oral mucosa  Eyes: Conjunctivae are normal. No scleral icterus.  Neck: Normal range of motion. Neck supple. JVD present.    Cardiovascular: Normal rate, regular rhythm and normal heart sounds. Exam reveals no gallop and no friction rub.  No murmur heard. Pulmonary/Chest: Effort normal and breath sounds normal. No respiratory distress.  Abdominal: Soft. Bowel sounds are normal. She exhibits no distension and no mass. There is no tenderness. There is no guarding.  Genitourinary:  Genitourinary Comments: Melanotic stool noted on the rectal probe  Neurological: She has normal reflexes. She appears lethargic. GCS eye subscore is 3. GCS verbal subscore is 4. GCS motor subscore is 5.  Skin: Skin is warm and dry. Capillary refill takes less than 2 seconds.  Psychiatric: Her behavior is normal.  Nursing note and vitals reviewed.    ED Treatments / Results  Labs (all labs ordered are listed, but only abnormal results are displayed) Labs Reviewed  CBG MONITORING, ED - Abnormal; Notable for the following components:      Result Value   Glucose-Capillary 204 (*)    All other components within normal limits  I-STAT CHEM 8, ED - Abnormal; Notable for the following components:   Sodium 133 (*)    Potassium 6.5 (*)    BUN 61 (*)    Creatinine, Ser 10.40 (*)    Glucose, Bld 211 (*)    Calcium, Ion 0.93 (*)    TCO2 19 (*)    All other components within normal limits  POC OCCULT BLOOD, ED - Abnormal; Notable for the following components:   Fecal Occult Bld POSITIVE (*)    All other components within normal limits  CULTURE, BLOOD (ROUTINE X 2)  CULTURE, BLOOD (ROUTINE X 2)  COMPREHENSIVE METABOLIC PANEL  CBC  URINALYSIS, ROUTINE W REFLEX MICROSCOPIC  LIPASE, BLOOD  BRAIN NATRIURETIC PEPTIDE  PROTIME-INR  OCCULT BLOOD X 1 CARD TO LAB, STOOL  I-STAT TROPONIN, ED  I-STAT ARTERIAL BLOOD GAS, ED  TYPE AND SCREEN    EKG  EKG Interpretation  Date/Time:  Wednesday October 20 2017 14:54:02 EST Ventricular Rate:  64 PR Interval:    QRS Duration: 191 QT Interval:  576 QTC Calculation: 595 R Axis:   -77 Text  Interpretation:  Sinus rhythm Multiform ventricular premature complexes Prolonged PR interval Probable left atrial enlargement RBBB and LAFB Left ventricular hypertrophy No significant change since last tracing Confirmed by Dorie Rank [phone]) on  10/20/2017 3:09:31 PM       Radiology No results found.  Procedures .Critical Care Performed by: Margarita Mail, PA-C Authorized by: Margarita Mail, PA-C   Critical care provider statement:    Critical care time (minutes):  50   Critical care was necessary to treat or prevent imminent or life-threatening deterioration of the following conditions:  Renal failure and respiratory failure   Critical care was time spent personally by me on the following activities:  Development of treatment plan with patient or surrogate, discussions with consultants, evaluation of patient's response to treatment, interpretation of cardiac output measurements, obtaining history from patient or surrogate, ordering and performing treatments and interventions, ordering and review of laboratory studies, ordering and review of radiographic studies, pulse oximetry, re-evaluation of patient's condition and review of old charts   (including critical care time)  Medications Ordered in ED Medications  albuterol (PROVENTIL) (2.5 MG/3ML) 0.083% nebulizer solution 5 mg (not administered)     Initial Impression / Assessment and Plan / ED Course  I have reviewed the triage vital signs and the nursing notes.  Pertinent labs & imaging results that were available during my care of the patient were reviewed by me and considered in my medical decision making (see chart for details).  Clinical Course as of Oct 20 2328  Wed Oct 20, 2017  1535 Potassium is elevated without Change in T waves. Potassium: (!!) 6.5 [AH]  1545 TCO2: (!) 19 [AH]  1546 Patient Stool + Fecal Occult Blood, POC: (!) POSITIVE [AH]  1616 Hemoglobin: 13.3 [AH]  1621 Patient with positive stool however she  appears subtherapeutic on her INR, and her hgb is 13.  [AH]  1627 I spoke with Dr. Florene Glen in  nephrology. They are aware of the patient's need for HD.  [AH]  1634 Patient lactate is elevated at 5.56. Would expect this with her ESRD, however we will cover for unknown.  [AH]  1643 Review of records shows that patient is followed by Dr. Fuller Plan. She has a previous hx of GI bleed which was thought to be from a diverticulum.  She has a hx of H.Pylori gastritis. And the patient was also newly diagnosed with Cirrhosis. I have added an Ammonia level.   [AH]  1646 I have not ordered protonix as the patient is already volume overloaded and may receive this after she is dialyzed.  [AH]    Clinical Course User Index [AH] Margarita Mail, PA-C    Patient has improved significantly with BiPAP.  Patient will be admitted by family medicine and emergently dialyzed.  She had temporizing agents given for her hyperkalemia here in the emergency department. Final Clinical Impressions(s) / ED Diagnoses   Final diagnoses:  Altered mental status, unspecified altered mental status type  Hyperkalemia  Gastrointestinal hemorrhage, unspecified gastrointestinal hemorrhage type  Respiratory distress  Lactic acid increased  Subtherapeutic international normalized ratio (INR)    ED Discharge Orders    None       Margarita Mail, PA-C 10/20/17 2332    Dorie Rank, MD 10/22/17 [phone]

## 2017-10-20 NOTE — Progress Notes (Signed)
Pharmacy Antibiotic Note  Angela Trujillo is a 84 y.o. female admitted on 10/20/2017 with sepsis.  Pharmacy has been consulted for vancomycin and aztreonam dosing.  -Pt is afebrile with elevated WBC at 11.3. -Pt is ESRD on dialysis Tuesday, Thursday, Saturday (last session on Saturday 1/26). Per daughter, pt missed dialysis session on Tues 1/29 and missed the rescheduled session today 1/30. Plan for emergent dialysis this evening. -Weight 62.6 kg per 1/18 note from [facility].  Plan:  Vancomycin 1500 mg IV x1 after dialysis F/u HD schedule for maintenance dose Aztreonam 500mg  IV q12h   Temp (24hrs), Avg:97.8 F (36.6 C), Min:97.5 F (36.4 C), Max:98.1 F (36.7 C)  Recent Labs  Lab 10/20/17 1520 10/20/17 1530  WBC 11.3*  --   CREATININE 9.86* 10.40*    CrCl cannot be calculated (Unknown ideal weight.).    Allergies  Allergen Reactions  . Asa [Aspirin] Nausea Only and Other (See Comments)    MAKES STOMACH HURT  . Codeine Rash  . Penicillins Rash     Has patient had a PCN reaction causing immediate rash, facial/tongue/throat swelling, SOB or lightheadedness with hypotension:  #  #  #  NO  #  #  #  Has patient had a PCN reaction causing severe rash involving mucus membranes or skin necrosis:  #  #  #  NO  #  #  # Has patient had a PCN reaction that required hospitalization:  #  #  #  NO  #  #  #  Has patient had a PCN reaction occurring within the last 10 years:  #  #  #  NO  #  #  #  If all answers are "NO", may proceed with Cephalosporin    Antimicrobials this admission: Vanc 1/30 >> Aztreonam 1/30 >>  Dose adjustments this admission: None  Microbiology results: Pending  Thank you for allowing pharmacy to be a part of this patient's care.  Jeni Duling 10/20/2017 5:10 PM

## 2017-10-20 NOTE — Progress Notes (Signed)
Received pt from [place], [place], HD tx was initiated @ 1834 via 15Gx2 w/o problem, by [place], RN, pull/push/flush equally w/o problem, VSS, will cont to monitor while on HD tx

## 2017-10-20 NOTE — Consult Note (Signed)
Reason for Consult: Hyperkalemia, continuity of ESRD care Referring Physician: Dorie Rank M.D. (EDP)  HPI: history obtained from chart/patient's daughter as she is encephalopathic  84 year old African-American woman with past medical history significant for diabetes mellitus, hypertension, atrial fibrillation on chronic anticoagulation therapy and end-stage renal disease on hemodialysis on a Tuesday/Thursday/Saturday schedule who was brought to the emergency room with altered mental status worsening since 2 days ago. Unfortunately because of her mentation, she has missed her scheduled hemodialysis treatment yesterday and her makeup treatment today. She was found to have hyperkalemia with a potassium of 6.5 and has evidence of mild volume overload with pedal edema/hypertension.  She gets her hemodialysis at the [facility] unit in [place], [place] and last underwent hemodialysis on Saturday 1/26. The unit was unreachable at the time of dictation.  Past Medical History:  Diagnosis Date  . Atrial fibrillation with RVR ([place]) 10/22/2016  . Atrial flutter ([place])   . Diabetes mellitus with end stage renal disease ([place])   . Dyspnea    with exertion  . ESRD (end stage renal disease) on dialysis ([place])    HD on T,T, Sa  . GERD (gastroesophageal reflux disease)   . Hypertension   . Pneumonia 10/2015  . Severe aortic stenosis 08/19/2016   s/p TAVR 09/2016 // b. Echo 09/30/16: EF 50-55, inf-septal AK, Gr 2 DD, normally functioning TAVR, mean AV gradient 15 mmHg, mod MAC, mild MS, mild MR, trivial effusion    Past Surgical History:  Procedure Laterality Date  . ABDOMINAL HYSTERECTOMY    . CARDIAC CATHETERIZATION N/A 08/19/2016   Procedure: Right/Left Heart Cath and Coronary Angiography;  Surgeon: Sherren Mocha, MD;  Location: [place] [facility];  Service: Cardiovascular;  Laterality: N/A;  . COLONOSCOPY    . COLONOSCOPY N/A 06/25/2017   Procedure: COLONOSCOPY;  Surgeon: Ladene Artist, MD;  Location: [facility]  ENDOSCOPY;  Service: Endoscopy;  Laterality: N/A;  . ESOPHAGOGASTRODUODENOSCOPY N/A 06/24/2017   Procedure: ESOPHAGOGASTRODUODENOSCOPY (EGD);  Surgeon: Ladene Artist, MD;  Location: [facility] ENDOSCOPY;  Service: Endoscopy;  Laterality: N/A;  . IR GENERIC HISTORICAL  08/31/2016   IR RADIOLOGY PERIPHERAL GUIDED IV START 08/31/2016 Corrie Mckusick, DO [facility]-INTERV RAD  . IR GENERIC HISTORICAL  08/31/2016   IR US GUIDE VASC ACCESS RIGHT 08/31/2016 Corrie Mckusick, DO [facility]  . MULTIPLE EXTRACTIONS WITH ALVEOLOPLASTY N/A 09/25/2016   Procedure: EXTRACTION of tooth number 31 WITH ALVEOLOPLASTY AND GROSS DEBRIDEMENT OF REMAINING TEETH;  Surgeon: Lenn Cal, DDS;  Location: [place];  Service: Oral Surgery;  Laterality: N/A;  . TEE WITHOUT CARDIOVERSION N/A 09/29/2016   Procedure: TRANSESOPHAGEAL ECHOCARDIOGRAM (TEE);  Surgeon: Sherren Mocha, MD;  Location: [place];  Service: Open Heart Surgery;  Laterality: N/A;  . TRANSCATHETER AORTIC VALVE REPLACEMENT, TRANSFEMORAL N/A 09/29/2016   Procedure: TRANSCATHETER AORTIC VALVE REPLACEMENT, TRANSFEMORAL;  Surgeon: Sherren Mocha, MD;  Location: [place];  Service: Open Heart Surgery;  Laterality: N/A;  . TUBAL LIGATION      Family History  Problem Relation Age of Onset  . CAD Neg Hx     Social History:  reports that  has never smoked. she has never used smokeless tobacco. She reports that she does not drink alcohol or use drugs.  Allergies:  Allergies  Allergen Reactions  . Asa [Aspirin] Nausea Only and Other (See Comments)    MAKES STOMACH HURT  . Codeine Rash  . Penicillins Rash     Has patient had a PCN reaction causing immediate rash, facial/tongue/throat swelling, SOB or  lightheadedness with hypotension:  #  #  #  NO  #  #  #  Has patient had a PCN reaction causing severe rash involving mucus membranes or skin necrosis:  #  #  #  NO  #  #  # Has patient had a PCN reaction that required hospitalization:  #  #  #  NO  #  #  #  Has patient had a PCN  reaction occurring within the last 10 years:  #  #  #  NO  #  #  #  If all answers are "NO", may proceed with Cephalosporin    Medications: Scheduled:   BMP Latest Ref Rng & Units 10/20/2017 10/20/2017 06/26/2017  Glucose 65 - 99 mg/dL 211(H) 205(H) 76  BUN 6 - 20 mg/dL 61(H) 57(H) 18  Creatinine 0.44 - 1.00 mg/dL 10.40(H) 9.86(H) 5.81(H)  BUN/Creat Ratio 12 - 28 - - -  Sodium 135 - 145 mmol/L 133(L) 134(L) 138  Potassium 3.5 - 5.1 mmol/L 6.5(HH) 6.6(HH) 4.3  Chloride 101 - 111 mmol/L 103 97(L) 103  CO2 22 - 32 mmol/L - 14(L) 25  Calcium 8.9 - 10.3 mg/dL - 8.9 6.9(L)   CBC Latest Ref Rng & Units 10/20/2017 10/20/2017 06/26/2017  WBC 4.0 - 10.5 K/uL - 11.3(H) 9.1  Hemoglobin 12.0 - 15.0 g/dL 13.3 11.6(L) 9.0(L)  Hematocrit 36.0 - 46.0 % 39.0 35.6(L) 27.7(L)  Platelets 150 - 400 K/uL - 116(L) 73(L)     Ct Head Wo Contrast  Result Date: 10/20/2017 CLINICAL DATA:  84 year old with altered mental status for 2 days. History of hypertension and diabetes. EXAM: CT HEAD WITHOUT CONTRAST TECHNIQUE: Contiguous axial images were obtained from the base of the skull through the vertex without intravenous contrast. COMPARISON:  CT head 10/02/2016. FINDINGS: Brain: There is no evidence of acute intracranial hemorrhage, mass lesion, brain edema or extra-axial fluid collection. There is stable generalized atrophy with prominence of the ventricles and subarachnoid spaces. Mild periarticular white matter disease appears unchanged. There is no CT evidence of acute cortical infarction. Stable fat density along the interhemispheric fissure. Vascular: Intracranial vascular calcifications. No hyperdense vessel identified. Skull: Negative for fracture or focal lesion. Sinuses/Orbits: Stable mucosal thickening in the sphenoid sinus. The additional paranasal sinuses are clear without air-levels. The mastoid air cells and middle ears are clear. No orbital abnormalities. Other: None. IMPRESSION: 1. No acute intracranial  findings. 2. Stable atrophy and chronic periventricular white matter disease. Electronically Signed   By: Richardean Sale M.D.   On: 10/20/2017 17:22   Dg Chest Port 1 View  Result Date: 10/20/2017 CLINICAL DATA:  Altered mental status EXAM: PORTABLE CHEST 1 VIEW COMPARISON:  02/16/2017 FINDINGS: Cardiac shadow remains enlarged. Aortic calcifications are again seen. Changes consistent with prior TAVR are noted. The lungs are well aerated bilaterally. Very minimal interstitial changes are noted stable from the previous exam. No acute infiltrate or sizable effusion is noted. No bony abnormality is seen. IMPRESSION: No active disease. Electronically Signed   By: Inez Catalina M.D.   On: 10/20/2017 16:17    Review of Systems  Unable to perform ROS: Mental status change   Blood pressure (!) 176/66, pulse 61, temperature 98.1 F (36.7 C), temperature source Rectal, resp. rate 19, SpO2 98 %. Physical Exam  Nursing note and vitals reviewed. Constitutional: She appears well-developed and well-nourished.  Lethargic/poorly responsive on BiPAP  HENT:  Head: Normocephalic and atraumatic.  Neck: Normal range of motion. JVD present. No thyromegaly present.  11 cm JVP  Cardiovascular: Normal rate and normal heart sounds.  No murmur heard. Irregularly irregular  Respiratory: Effort normal and breath sounds normal. She has no wheezes. She has no rales.  Anteriorly clear to auscultation while on BiPAP  GI: Soft. Bowel sounds are normal. She exhibits no distension. There is no tenderness. There is no rebound and no guarding.  Musculoskeletal: She exhibits edema.  1-2+ edema over lower extremities. Right upper arm AV fistula-pulsatile  Neurological:  Somnolent/lethargic  Skin: Skin is warm and dry. No rash noted. No erythema.    Assessment/Plan: 1. Altered mental status: Suspected to be metabolic encephalopathy associated with sepsis of unclear source. Panculture workup undertaken and she is on empiric  antibiotic therapy as results are awaited and fortunately appears to be hemodynamically stable to tolerate conventional hemodialysis. With hypoxic respiratory failure due to her encephalopathy and inability to adequately protect airway for which she is on noninvasive positive pressure ventilation. 2. Hyperkalemia: Secondary to missed hemodialysis, will plan for emergent hemodialysis today. 3. Hypertension: Also possibly secondary to her encephalopathy and missed hemodialysis-ultrafiltration with hemodialysis given evidence of volume excess based on physical exam/edema 4. End-stage renal disease: Hemodialysis today emergently for hyperkalemia and then reevaluation again tomorrow to decide on elective hemodialysis on her usual outpatient schedule or need to postpone this/alter schedule while admitted to the hospital based on labs/clinical parameters. 5. Chronic kidney disease-metabolic bone disease: Resume phosphorus binders when her encephalopathy has resolved and she is able to adequately take in by mouth. Reconcile medications for vitamin D receptor analog when her dialysis unit is reachable. 6. Anemia of chronic kidney disease: Hemoglobin appears to be satisfactory at this time, hold on ESA. 7. Atrial fibrillation: with controlled ventricular response, resume anticoagulation.  Nicky Kras K. 10/20/2017, 5:27 PM

## 2017-10-20 NOTE — ED Triage Notes (Signed)
Pt in for altered mental status which began Monday. Family called EMS today, brought from home. Pt is on Tues-Thurs-Sat dialysis, missed Tuesday.

## 2017-10-21 DIAGNOSIS — E875 Hyperkalemia: Secondary | ICD-10-CM

## 2017-10-21 DIAGNOSIS — J101 Influenza due to other identified influenza virus with other respiratory manifestations: Principal | ICD-10-CM

## 2017-10-21 DIAGNOSIS — I130 Hypertensive heart and chronic kidney disease with heart failure and stage 1 through stage 4 chronic kidney disease, or unspecified chronic kidney disease: Secondary | ICD-10-CM

## 2017-10-21 DIAGNOSIS — I350 Nonrheumatic aortic (valve) stenosis: Secondary | ICD-10-CM

## 2017-10-21 DIAGNOSIS — I481 Persistent atrial fibrillation: Secondary | ICD-10-CM

## 2017-10-21 DIAGNOSIS — Z8719 Personal history of other diseases of the digestive system: Secondary | ICD-10-CM

## 2017-10-21 DIAGNOSIS — N186 End stage renal disease: Secondary | ICD-10-CM

## 2017-10-21 DIAGNOSIS — Z952 Presence of prosthetic heart valve: Secondary | ICD-10-CM

## 2017-10-21 DIAGNOSIS — E1122 Type 2 diabetes mellitus with diabetic chronic kidney disease: Secondary | ICD-10-CM

## 2017-10-21 DIAGNOSIS — Z7901 Long term (current) use of anticoagulants: Secondary | ICD-10-CM

## 2017-10-21 DIAGNOSIS — I878 Other specified disorders of veins: Secondary | ICD-10-CM

## 2017-10-21 DIAGNOSIS — J111 Influenza due to unidentified influenza virus with other respiratory manifestations: Secondary | ICD-10-CM

## 2017-10-21 DIAGNOSIS — Z992 Dependence on renal dialysis: Secondary | ICD-10-CM

## 2017-10-21 DIAGNOSIS — I5030 Unspecified diastolic (congestive) heart failure: Secondary | ICD-10-CM

## 2017-10-21 DIAGNOSIS — Z794 Long term (current) use of insulin: Secondary | ICD-10-CM

## 2017-10-21 DIAGNOSIS — Z79899 Other long term (current) drug therapy: Secondary | ICD-10-CM

## 2017-10-21 DIAGNOSIS — Z7982 Long term (current) use of aspirin: Secondary | ICD-10-CM

## 2017-10-21 LAB — CBC
HCT: 37.9 % (ref 36.0–46.0)
HEMATOCRIT: 33.8 % — AB (ref 36.0–46.0)
Hemoglobin: 12.3 g/dL (ref 12.0–15.0)
Hemoglobin: 11.2 g/dL — ABNORMAL LOW (ref 12.0–15.0)
MCH: 28.9 pg (ref 26.0–34.0)
MCH: 29.3 pg (ref 26.0–34.0)
MCHC: 32.5 g/dL (ref 30.0–36.0)
MCHC: 33.1 g/dL (ref 30.0–36.0)
MCV: 89 fL (ref 78.0–100.0)
MCV: 88.5 fL (ref 78.0–100.0)
PLATELETS: 104 10*3/uL — AB (ref 150–400)
PLATELETS: 112 10*3/uL — AB (ref 150–400)
RBC: 4.26 MIL/uL (ref 3.87–5.11)
RBC: 3.82 MIL/uL — ABNORMAL LOW (ref 3.87–5.11)
RDW: 17.4 % — ABNORMAL HIGH (ref 11.5–15.5)
RDW: 17.8 % — AB (ref 11.5–15.5)
WBC: 9.4 10*3/uL (ref 4.0–10.5)
WBC: 8.6 10*3/uL (ref 4.0–10.5)

## 2017-10-21 LAB — HEPATITIS B SURFACE ANTIGEN: HEP B S AG: NEGATIVE

## 2017-10-21 LAB — RENAL FUNCTION PANEL
Albumin: 2.6 g/dL — ABNORMAL LOW (ref 3.5–5.0)
Anion gap: 15 (ref 5–15)
BUN: 31 mg/dL — AB (ref 6–20)
CO2: 25 mmol/L (ref 22–32)
CREATININE: 6.4 mg/dL — AB (ref 0.44–1.00)
Calcium: 8 mg/dL — ABNORMAL LOW (ref 8.9–10.3)
Chloride: 95 mmol/L — ABNORMAL LOW (ref 101–111)
GFR calc Af Amer: 6 mL/min — ABNORMAL LOW (ref >60)
GFR, EST NON AFRICAN AMERICAN: 5 mL/min — AB (ref >60)
GLUCOSE: 224 mg/dL — AB (ref 65–99)
PHOSPHORUS: 3.9 mg/dL (ref 2.5–4.6)
POTASSIUM: 4.3 mmol/L (ref 3.5–5.1)
SODIUM: 135 mmol/L (ref 135–145)

## 2017-10-21 LAB — BASIC METABOLIC PANEL
Anion gap: 17 — ABNORMAL HIGH (ref 5–15)
BUN: 19 mg/dL (ref 6–20)
CO2: 25 mmol/L (ref 22–32)
Calcium: 8.5 mg/dL — ABNORMAL LOW (ref 8.9–10.3)
Chloride: 98 mmol/L — ABNORMAL LOW (ref 101–111)
Creatinine, Ser: 5 mg/dL — ABNORMAL HIGH (ref 0.44–1.00)
GFR calc Af Amer: 8 mL/min — ABNORMAL LOW (ref >60)
GFR calc non Af Amer: 7 mL/min — ABNORMAL LOW (ref >60)
Glucose, Bld: 107 mg/dL — ABNORMAL HIGH (ref 65–99)
POTASSIUM: 3.8 mmol/L (ref 3.5–5.1)
SODIUM: 140 mmol/L (ref 135–145)

## 2017-10-21 LAB — PROTIME-INR
INR: 3
PROTHROMBIN TIME: 30.9 s — AB (ref 11.4–15.2)

## 2017-10-21 LAB — HEPATITIS B SURFACE ANTIBODY,QUALITATIVE: Hep B S Ab: REACTIVE

## 2017-10-21 LAB — INFLUENZA PANEL BY PCR (TYPE A & B)
INFLBPCR: NEGATIVE
Influenza A By PCR: POSITIVE — AB

## 2017-10-21 LAB — MRSA PCR SCREENING: MRSA BY PCR: NEGATIVE

## 2017-10-21 LAB — HEPATITIS B CORE ANTIBODY, IGM: Hep B C IgM: NEGATIVE

## 2017-10-21 LAB — ETHANOL: Alcohol, Ethyl (B): <10 mg/dL (ref <10)

## 2017-10-21 MED ORDER — HEPARIN SODIUM (PORCINE) 1000 UNIT/ML DIALYSIS
20.0000 [IU]/kg | INTRAMUSCULAR | Status: DC | PRN
  Administered 2017-10-21: 1300 [IU] via INTRAVENOUS_CENTRAL

## 2017-10-21 MED ORDER — IPRATROPIUM-ALBUTEROL 0.5-2.5 (3) MG/3ML IN SOLN
3.0000 mL | Freq: Once | RESPIRATORY_TRACT | Status: AC
  Administered 2017-10-21: 3 mL via RESPIRATORY_TRACT
  Filled 2017-10-21: qty 3

## 2017-10-21 MED ORDER — PENTAFLUOROPROP-TETRAFLUOROETH EX AERO: 1.0000 "application " | INHALATION_SPRAY | CUTANEOUS | Status: DC | PRN

## 2017-10-21 MED ORDER — WARFARIN - PHARMACIST DOSING INPATIENT
Freq: Every day | Status: DC
  Administered 2017-10-21: 1

## 2017-10-21 MED ORDER — LIDOCAINE-PRILOCAINE 2.5-2.5 % EX CREA
1.0000 "application " | TOPICAL_CREAM | CUTANEOUS | Status: DC | PRN
  Filled 2017-10-21: qty 5

## 2017-10-21 MED ORDER — ALTEPLASE 2 MG IJ SOLR: 2.0000 mg | Freq: Once | INTRAMUSCULAR | Status: DC | PRN

## 2017-10-21 MED ORDER — WARFARIN SODIUM 2.5 MG PO TABS
2.5000 mg | ORAL_TABLET | Freq: Once | ORAL | Status: AC
  Administered 2017-10-21: 2.5 mg via ORAL
  Filled 2017-10-21: qty 1

## 2017-10-21 MED ORDER — SODIUM CHLORIDE 0.9 % IV SOLN: 100.0000 mL | INTRAVENOUS | Status: DC | PRN

## 2017-10-21 MED ORDER — HEPARIN SODIUM (PORCINE) 1000 UNIT/ML DIALYSIS: 1000.0000 [IU] | INTRAMUSCULAR | Status: DC | PRN

## 2017-10-21 MED ORDER — LIDOCAINE HCL (PF) 1 % IJ SOLN: 5.0000 mL | INTRAMUSCULAR | Status: DC | PRN

## 2017-10-21 MED ORDER — PROMETHAZINE HCL 25 MG/ML IJ SOLN: 6.2500 mg | Freq: Three times a day (TID) | INTRAMUSCULAR | Status: DC | PRN

## 2017-10-21 MED ORDER — ORAL CARE MOUTH RINSE
15.0000 mL | Freq: Two times a day (BID) | OROMUCOSAL | Status: DC
  Administered 2017-10-22 – 2017-10-24 (×3): 15 mL via OROMUCOSAL

## 2017-10-21 MED ORDER — OSELTAMIVIR PHOSPHATE 30 MG PO CAPS
30.0000 mg | ORAL_CAPSULE | Freq: Once | ORAL | Status: AC
  Administered 2017-10-21: 30 mg via ORAL
  Filled 2017-10-21: qty 1

## 2017-10-21 MED ORDER — OSELTAMIVIR PHOSPHATE 30 MG PO CAPS
30.0000 mg | ORAL_CAPSULE | ORAL | Status: AC
  Administered 2017-10-21 – 2017-10-23 (×2): 30 mg via ORAL
  Filled 2017-10-21 (×2): qty 1

## 2017-10-21 MED ORDER — NEPRO/CARBSTEADY PO LIQD
237.0000 mL | Freq: Two times a day (BID) | ORAL | Status: DC
  Administered 2017-10-21 – 2017-10-24 (×4): 237 mL via ORAL
  Filled 2017-10-21 (×10): qty 237

## 2017-10-21 NOTE — Progress Notes (Signed)
ANTICOAGULATION CONSULT NOTE - Initial Consult  Pharmacy Consult for Warfarin Indication: atrial fibrillation  Allergies  Allergen Reactions  . Asa [Aspirin] Nausea Only and Other (See Comments)    MAKES STOMACH HURT  . Codeine Rash  . Penicillins Rash     Has patient had a PCN reaction causing immediate rash, facial/tongue/throat swelling, SOB or lightheadedness with hypotension:  #  #  #  NO  #  #  #  Has patient had a PCN reaction causing severe rash involving mucus membranes or skin necrosis:  #  #  #  NO  #  #  # Has patient had a PCN reaction that required hospitalization:  #  #  #  NO  #  #  #  Has patient had a PCN reaction occurring within the last 10 years:  #  #  #  NO  #  #  #  If all answers are "NO", may proceed with Cephalosporin   Vital Signs: Temp: 98.1 F (36.7 C) (01/31 0700) Temp Source: Oral (01/31 0700) BP: 137/52 (01/31 0700) Pulse Rate: 64 (01/31 0700)  Labs: Recent Labs    10/20/17 1520 10/20/17 1530 10/21/17 0023  HGB 11.6* 13.3 12.3  HCT 35.6* 39.0 37.9  PLT 116*  --  104*  LABPROT 32.1*  --  30.9*  INR 3.15  --  3.00  CREATININE 9.86* 10.40* 5.00*    Estimated Creatinine Clearance: 7.5 mL/min (A) (by C-G formula based on SCr of 5 mg/dL (H)).  Medications:  Warfarin PTA 2.5mg  daily except 5mg  Tu/Th/Sat (last dose was 5mg  on 1/29)  Assessment: 86yof presented to ED for AMS. Of note, pt had decreased PO intake for the past several days.   INR is 3 today.  CBC stable  HD patient  Goal of Therapy:  INR 2-3 Monitor platelets by anticoagulation protocol: Yes   Plan:  Warfarin 2.5 mg x 1 Daily INR  Levester Fresh, PharmD, BCPS, BCCCP Clinical Pharmacist Clinical phone for 10/21/2017 from 7a-3:30p: [phone] If after 3:30p, please call main pharmacy at: [phone] 10/21/2017 9:57 AM

## 2017-10-21 NOTE — Progress Notes (Addendum)
  Date: 10/21/2017  Patient name: Angela Trujillo  Medical record number: [id number]  Date of birth: Dec 01, 1930   I have seen and evaluated Ledell Peoples and discussed their care with the Residency Team.  In brief, patient is an 84 year old female with a past medical history of end-stage renal disease on hemodialysis, insulin-dependent type 2 diabetes, A. fib on Coumadin, hypertension and severe left ear status post TAVR on September 29, 2016 who presented with altered mental status over the last 2-3 days.  History obtained from chart as patient is unable to provide a good history.  Per chart, patient was in usual state of health until Monday night when she complained of fatigue and tiredness.  The patient continued to have these symptoms associated with nausea, decreased p.o. intake and progressive lethargy.  Patient was unable to eat breakfast by herself and cannot dress herself (at baseline able to perform all her ADLs).  EMS was called and patient brought to the ED for further evaluation.  No fevers or chills, no chest pain, no shortness of breath, no cough, no abdominal pain, no urinary symptoms.  Patient also missed a session of hemodialysis on January 29 secondary to fatigue and lethargy.  Patient required BiPAP overnight but is now on 2 L nasal cannula and has O2 sats of 100.  Today patient is easily arousable but sleepy and states she feels "fine".  PMHx, Fam Hx, and/or Soc Hx : As per resident admit note  Vitals:   10/21/17 0800 10/21/17 0931  BP: (!) 158/54   Pulse: 61   Resp: 14   Temp:    SpO2: 100% 100%   General: Sleepy but easily arousable, NAD, oriented to place and person CVS: Regular rate and rhythm, distant heart sounds Lungs: Bilateral expiratory wheeze noted, no crackles Abdomen: Soft, nontender, nondistended, normoactive bowel sounds Extremities: No edema noted  Assessment and Plan: I have seen and evaluated the patient as outlined above. I agree with the formulated  Assessment and Plan as detailed in the residents' note, with the following changes:   1.  Influenza A: -Patient presented to the ED with worsening lethargy associated with nausea and decreased p.o. intake over the last 2-3 days and was found to be hypoxic with increased work of breathing on admission initially requiring BiPAP overnight and found to have influenza A on testing. -Patient altered mental status was likely metabolic encephalopathy secondary to underlying influenza infection -Continue Tamiflu for now -No evidence of underlying pneumonia given normal chest x-ray and normal white count and no fevers.  We will discontinue vancomycin and aztreonam -Continue with nebs every 4 hours as needed -Patient now on 2 L nasal cannula with O2 sats of 100.  We will continue to wean oxygen off -When patient was initially admitted she was noted to be hyperkalemic with a potassium of 6.6 and likely hypervolemic in the setting of a missed hemodialysis session.  Patient was taken for hemodialysis emergently yesterday and potassium is now normalized.  Continue with hemodialysis per nephrology -Continue with Coumadin for aortic valve replacement and A. fib.  INR is at goal currently.  We will continue to monitor INR and dose Coumadin per pharmacy -We will monitor in stepdown today and consider transfer later today or tomorrow if she continues to remain stable  Aldine Contes, MD 1/31/201910:32 AM

## 2017-10-21 NOTE — Evaluation (Signed)
Clinical/Bedside Swallow Evaluation Patient Details  Name: Angela Trujillo MRN: [id number] Date of Birth: 08-10-1931  Today's Date: 10/21/2017 Time: SLP Start Time (ACUTE ONLY): 1113 SLP Stop Time (ACUTE ONLY): 1142 SLP Time Calculation (min) (ACUTE ONLY): 29 min  Past Medical History:  Past Medical History:  Diagnosis Date  . Atrial fibrillation with RVR ([place]) 10/22/2016  . Atrial flutter ([place])   . Diabetes mellitus with end stage renal disease ([place])   . Dyspnea    with exertion  . ESRD (end stage renal disease) on dialysis ([place])    HD on T,T, Sa  . GERD (gastroesophageal reflux disease)   . Hypertension   . Pneumonia 10/2015  . Severe aortic stenosis 08/19/2016   s/p TAVR 09/2016 // b. Echo 09/30/16: EF 50-55, inf-septal AK, Gr 2 DD, normally functioning TAVR, mean AV gradient 15 mmHg, mod MAC, mild MS, mild MR, trivial effusion   Past Surgical History:  Past Surgical History:  Procedure Laterality Date  . ABDOMINAL HYSTERECTOMY    . CARDIAC CATHETERIZATION N/A 08/19/2016   Procedure: Right/Left Heart Cath and Coronary Angiography;  Surgeon: Sherren Mocha, MD;  Location: [place] [facility];  Service: Cardiovascular;  Laterality: N/A;  . COLONOSCOPY    . COLONOSCOPY N/A 06/25/2017   Procedure: COLONOSCOPY;  Surgeon: Ladene Artist, MD;  Location: [facility] ENDOSCOPY;  Service: Endoscopy;  Laterality: N/A;  . ESOPHAGOGASTRODUODENOSCOPY N/A 06/24/2017   Procedure: ESOPHAGOGASTRODUODENOSCOPY (EGD);  Surgeon: Ladene Artist, MD;  Location: [facility] ENDOSCOPY;  Service: Endoscopy;  Laterality: N/A;  . IR GENERIC HISTORICAL  08/31/2016   IR RADIOLOGY PERIPHERAL GUIDED IV START 08/31/2016 Corrie Mckusick, DO [facility]-INTER[facility]  . IR GENERIC HISTORICAL  08/31/2016   IR US GUIDE VASC ACCESS RIGHT 08/31/2016 Corrie Mckusick, DO [facility]  . MULTIPLE EXTRACTIONS WITH ALVEOLOPLASTY N/A 09/25/2016   Procedure: EXTRACTION of tooth number 31 WITH ALVEOLOPLASTY AND GROSS DEBRIDEMENT OF REMAINING TEETH;  Surgeon:  Lenn Cal, DDS;  Location: [place];  Service: Oral Surgery;  Laterality: N/A;  . TEE WITHOUT CARDIOVERSION N/A 09/29/2016   Procedure: TRANSESOPHAGEAL ECHOCARDIOGRAM (TEE);  Surgeon: Sherren Mocha, MD;  Location: [place];  Service: Open Heart Surgery;  Laterality: N/A;  . TRANSCATHETER AORTIC VALVE REPLACEMENT, TRANSFEMORAL N/A 09/29/2016   Procedure: TRANSCATHETER AORTIC VALVE REPLACEMENT, TRANSFEMORAL;  Surgeon: Sherren Mocha, MD;  Location: [place];  Service: Open Heart Surgery;  Laterality: N/A;  . TUBAL LIGATION     HPI:  [name] is an 84 year old female with history of ESRD on HD T/TH/S, insulin-dependent T2 DM,Afib on Coumadin,HTN, andsevere aortic stenosis s/p TAVR on 09/29/2016 who presents to the ED on (1/30) with a 2-3-day history of altered mental status patient is very lethargic and fatigued. Past swallowing assessment completed at other hospital, MBS completed with no aspiration/penetration and recommendation for thin liquids and Dys 3 solids. Pt was downgraded to Dys 1, per pt request. BSE ordered secondary to pt's lethargy this AM and hx of dysphagia/PNA and reduced respiratory status.    Assessment / Plan / Recommendation Clinical Impression   Pt presents with mild risk of aspiration, indicated by slight cough following larger sip of thin liquid; she had no signs of aspiration with small, controlled sips of thin liquid. Pt consumed all solids trials with min oral residue cleared with cued liquid wash. Trials were limited secondary to pt's lethargy. Mod verbal and tactile cues were required to maintain pt's alertness throughout trials. Pt reports preferring softer solids prior to admission; therefore, recommend Dys 2 (chopped) diet  and thin liquid diet. Pt is at increased risk for aspiration given her respiratory status, PMHx, and lethargy; therefore, recommend aspiration precautions (slow/small bites, siting upright for oral intake, and only feeding when pt is completely alert)  and full supervision to ensure pt is alert throughout meal. SLP will follow-up to ensure safety with recommended diet.   SLP Visit Diagnosis: Dysphagia, oropharyngeal phase (R13.12)    Aspiration Risk  Mild aspiration risk    Diet Recommendation Dysphagia 2 (Fine chop);Thin liquid   Liquid Administration via: Cup;Straw Medication Administration: Whole meds with puree Supervision: Staff to assist with self feeding;Full supervision/cueing for compensatory strategies Compensations: Slow rate;Small sips/bites;Minimize environmental distractions Postural Changes: Seated upright at 90 degrees    Other  Recommendations Oral Care Recommendations: Oral care BID   Follow up Recommendations None      Frequency and Duration min 2x/week  2 weeks       Prognosis Prognosis for Safe Diet Advancement: Good Barriers to Reach Goals: Other (Comment)(lethargy)      Swallow Study   General HPI: [name] is an 84 year old female with history of ESRD on HD T/TH/S, insulin-dependent T2 DM,Afib on Coumadin,HTN, andsevere aortic stenosis s/p TAVR on 09/29/2016 who presents to the ED on (1/30) with a 2-3-day history of altered mental status patient is very lethargic and fatigued. Past swallowing assessment completed at other hospital, MBS completed with no aspiration/penetration and recommendation for thin liquids and Dys 3 solids. Pt was downgraded to Dys 1, per pt request. BSE ordered secondary to pt's lethargy this AM and hx of dysphagia/PNA and reduced respiratory status.  Type of Study: Bedside Swallow Evaluation Previous Swallow Assessment: see HPI Diet Prior to this Study: Regular;Thin liquids Temperature Spikes Noted: No Respiratory Status: Nasal cannula History of Recent Intubation: No Behavior/Cognition: Lethargic/Drowsy;Requires cueing;Pleasant mood Oral Cavity Assessment: Within Functional Limits Oral Care Completed by SLP: Yes Oral Cavity - Dentition: Adequate natural  dentition;Missing dentition Vision: Functional for self-feeding Self-Feeding Abilities: Needs assist Patient Positioning: Upright in bed Baseline Vocal Quality: Low vocal intensity Volitional Cough: Strong;Congested Volitional Swallow: Able to elicit    Oral/Motor/Sensory Function Overall Oral Motor/Sensory Function: Within functional limits   Ice Chips Ice chips: Within functional limits Presentation: Spoon   Thin Liquid Thin Liquid: Impaired Presentation: Straw;Cup Pharyngeal  Phase Impairments: Cough - Immediate(following 1/6 sips thin liquid )    Nectar Thick Nectar Thick Liquid: Not tested   Honey Thick Honey Thick Liquid: Not tested   Puree Puree: Within functional limits Presentation: Spoon   Solid   GO   Solid: Impaired Presentation: Self Fed Oral Phase Functional Implications: Oral residue(min lingual residue)       [place] Bibiana Gillean SLP Student Clinician   [place] Nikolas Casher 10/21/2017,12:59 PM

## 2017-10-21 NOTE — Evaluation (Signed)
Physical Therapy Evaluation Patient Details Name: Angela Trujillo MRN: [id number] DOB: 04-19-1931 Today's Date: 10/21/2017   History of Present Illness  Angela Trujillo is an 84 year old female with history of ESRD on HD T/TH/S, insulin-dependent T2 DM, Afib on Coumadin, HTN, and severe aortic stenosis s/p TAVR on 09/29/2016 who presents to the ED with a 2-3-day history of altered mental status.  Missed dialysis and was found to be influenza A positive and was started on Bipap.  Clinical Impression  Patient presents with decreased mobility due to deficits listed in PT problem list.  She will benefit from skilled PT in the acute setting to allow return home following post acute rehab stay.  Currently max A for mobility mainly due to lethargy, though answered most of my questions with eyes closed seated at EOB.      Follow Up Recommendations CIR    Equipment Recommendations  Other (comment)(TBA)    Recommendations for Other Services Rehab consult     Precautions / Restrictions Precautions Precautions: Fall      Mobility  Bed Mobility Overal bed mobility: Needs Assistance Bed Mobility: Supine to Sit;Sit to Supine     Supine to sit: Max assist Sit to supine: +2 for physical assistance;Max assist   General bed mobility comments: increased time to initiate or to participate due to lethargy, assisted with trunk and legs from sidelying to sit and RN in to assist to supine and scoot to [facility]  Transfers Overall transfer level: Needs assistance Equipment used: 1 person hand held assist Transfers: Sit to/from Stand Sit to Stand: Max assist         General transfer comment: lifting and lowering help, attempted for pt to take steps to [facility], but unable  Ambulation/Gait                Stairs            Wheelchair Mobility    Modified Rankin (Stroke Patients Only)       Balance Overall balance assessment: Needs assistance Sitting-balance support: Feet supported Sitting  balance-Leahy Scale: Poor Sitting balance - Comments: can sit unsupported briefly, but due to lethargy needed at least S at times min A     Standing balance-Leahy Scale: Poor Standing balance comment: mod A for balance in standing with HHA                             Pertinent Vitals/Pain Pain Assessment: No/denies pain    Home Living Family/patient expects to be discharged to:: Private residence Living Arrangements: Alone(pt states alone, chart states daughter lives with her)   Type of Home: House Home Access: Stairs to enter   Technical brewer of Steps: 1+1 Home Layout: One level        Prior Function Level of Independence: Independent with assistive device(s)         Comments: states using cane     Hand Dominance        Extremity/Trunk Assessment   Upper Extremity Assessment Upper Extremity Assessment: Generalized weakness    Lower Extremity Assessment Lower Extremity Assessment: Generalized weakness;RLE deficits/detail;LLE deficits/detail RLE Deficits / Details: significant edema in LE's, unable to formally test due to pt's lethargy, but lifts antigravity, has numbness in feet LLE Deficits / Details: significant edema in LE's, unable to formally test due to pt's lethargy, but lifts antigravity, has numbness in feet    Cervical / Trunk Assessment Cervical / Trunk Assessment: Kyphotic  Communication   Communication: HOH(very limited verbalizations due to level of arousal)  Cognition Arousal/Alertness: Lethargic Behavior During Therapy: Flat affect Overall Cognitive Status: Difficult to assess                                        General Comments General comments (skin integrity, edema, etc.): incontinent of small stool sitting at EOB, RN in to assist with hygiene    Exercises     Assessment/Plan    PT Assessment Patient needs continued PT services  PT Problem List Decreased strength;Decreased balance;Decreased  activity tolerance;Decreased cognition;Decreased safety awareness;Decreased mobility       PT Treatment Interventions DME instruction;Functional mobility training;Balance training;Patient/family education;Gait training;Therapeutic activities;Therapeutic exercise    PT Goals (Current goals can be found in the Care Plan section)  Acute Rehab PT Goals Patient Stated Goal: Agreeable to rehab PT Goal Formulation: Patient unable to participate in goal setting Time For Goal Achievement: 11/04/17 Potential to Achieve Goals: Fair    Frequency Min 3X/week   Barriers to discharge        Co-evaluation               AM-PAC PT "6 Clicks" Daily Activity  Outcome Measure Difficulty turning over in bed (including adjusting bedclothes, sheets and blankets)?: Unable Difficulty moving from lying on back to sitting on the side of the bed? : Unable Difficulty sitting down on and standing up from a chair with arms (e.g., wheelchair, bedside commode, etc,.)?: Unable Help needed moving to and from a bed to chair (including a wheelchair)?: A Lot Help needed walking in hospital room?: Total Help needed climbing 3-5 steps with a railing? : Total 6 Click Score: 7    End of Session Equipment Utilized During Treatment: Gait belt Activity Tolerance: Patient limited by lethargy Patient left: in bed;with call bell/phone within reach;with bed alarm set Nurse Communication: Mobility status PT Visit Diagnosis: Muscle weakness (generalized) (M62.81);Other symptoms and signs involving the nervous system (R29.898);Other abnormalities of gait and mobility (R26.89)    Time: 8676-1950 PT Time Calculation (min) (ACUTE ONLY): 24 min   Charges:   PT Evaluation $PT Eval Moderate Complexity: 1 Mod PT Treatments $Therapeutic Activity: 8-22 mins   PT G CodesMagda Kiel, [place] [phone] 10/21/2017   Reginia Naas 10/21/2017, 5:12 PM

## 2017-10-21 NOTE — Progress Notes (Signed)
Assessment/Plan: 1. Altered mental status: Improved 2. Hyperkalemia: Resolved 3. Hypertension: Suboptimal 4. End-stage renal disease: Will do dialysis again for volume and BP. 5. Atrial fibrillation: with controlled ventricular response-on warfarin 6. S/p TAVR  Subjective: Interval History: Off BiPAP, still SOB  Objective: Vital signs in last 24 hours: Temp:  [97.4 F (36.3 C)-98.1 F (36.7 C)] 98.1 F (36.7 C) (01/31 0700) Pulse Rate:  [49-67] 61 (01/31 0800) Resp:  [14-29] 14 (01/31 0800) BP: (101-197)/(52-90) 158/54 (01/31 0800) SpO2:  [87 %-100 %] 100 % (01/31 0931) FiO2 (%):  [35 %] 35 % (01/30 2307) Weight:  [66.2 kg (146 lb)] 66.2 kg (146 lb) (01/30 1859) Weight change:   Intake/Output from previous day: 01/30 0701 - 01/31 0700 In: 50 [IV Piggyback:50] Out: 3002  Intake/Output this shift: No intake/output data recorded.  General appearance: slowed mentation Neck: no adenopathy, no carotid bruit, supple, symmetrical, trachea midline, thyroid not enlarged, symmetric, no tenderness/mass/nodules and neck veins up Resp: coarse BS Cardio: irregularly irregular rhythm  Ext 1-2+ edema   Lab Results: Recent Labs    10/20/17 1520 10/20/17 1530 10/21/17 0023  WBC 11.3*  --  9.4  HGB 11.6* 13.3 12.3  HCT 35.6* 39.0 37.9  PLT 116*  --  104*   BMET:  Recent Labs    10/20/17 1520 10/20/17 1530 10/21/17 0023  NA 134* 133* 140  K 6.6* 6.5* 3.8  CL 97* 103 98*  CO2 14*  --  25  GLUCOSE 205* 211* 107*  BUN 57* 61* 19  CREATININE 9.86* 10.40* 5.00*  CALCIUM 8.9  --  8.5*   No results for input(s): PTH in the last 72 hours. Iron Studies: No results for input(s): IRON, TIBC, TRANSFERRIN, FERRITIN in the last 72 hours. Studies/Results: Ct Head Wo Contrast  Result Date: 10/20/2017 CLINICAL DATA:  84 year old with altered mental status for 2 days. History of hypertension and diabetes. EXAM: CT HEAD WITHOUT CONTRAST TECHNIQUE: Contiguous axial images were obtained  from the base of the skull through the vertex without intravenous contrast. COMPARISON:  CT head 10/02/2016. FINDINGS: Brain: There is no evidence of acute intracranial hemorrhage, mass lesion, brain edema or extra-axial fluid collection. There is stable generalized atrophy with prominence of the ventricles and subarachnoid spaces. Mild periarticular white matter disease appears unchanged. There is no CT evidence of acute cortical infarction. Stable fat density along the interhemispheric fissure. Vascular: Intracranial vascular calcifications. No hyperdense vessel identified. Skull: Negative for fracture or focal lesion. Sinuses/Orbits: Stable mucosal thickening in the sphenoid sinus. The additional paranasal sinuses are clear without air-levels. The mastoid air cells and middle ears are clear. No orbital abnormalities. Other: None. IMPRESSION: 1. No acute intracranial findings. 2. Stable atrophy and chronic periventricular white matter disease. Electronically Signed   By: Richardean Sale M.D.   On: 10/20/2017 17:22   Dg Chest Port 1 View  Result Date: 10/20/2017 CLINICAL DATA:  Altered mental status EXAM: PORTABLE CHEST 1 VIEW COMPARISON:  02/16/2017 FINDINGS: Cardiac shadow remains enlarged. Aortic calcifications are again seen. Changes consistent with prior TAVR are noted. The lungs are well aerated bilaterally. Very minimal interstitial changes are noted stable from the previous exam. No acute infiltrate or sizable effusion is noted. No bony abnormality is seen. IMPRESSION: No active disease. Electronically Signed   By: Inez Catalina M.D.   On: 10/20/2017 16:17    Scheduled: . aspirin  81 mg Oral Daily  . calcium acetate  667 mg Oral Q breakfast  . feeding supplement (NEPRO  CARB STEADY)  237 mL Oral BID BM  . ferrous sulfate  325 mg Oral BID WC  . mouth rinse  15 mL Mouth Rinse BID  . multivitamin  1 tablet Oral QHS  . oseltamivir  30 mg Oral Once  . oseltamivir  30 mg Oral Q T,Th,Sat-1800  .  warfarin  2.5 mg Oral ONCE-1800  . Warfarin - Pharmacist Dosing Inpatient   Does not apply q1800     LOS: 1 day   [name] 10/21/2017,10:36 AM

## 2017-10-21 NOTE — Progress Notes (Signed)
Patient was drowsy majority of shift, however pt mentation approved during shift.  Pt remained relatively hemodynamically stable throughout shift; hx of afib. Patient has HD ordered for today due to edema and expiratory wheezing upon assessment. Please see flowsheets for detailed data.

## 2017-10-21 NOTE — Progress Notes (Signed)
Nutrition Brief Note  Patient identified on the Malnutrition Screening Tool (MST) Report.  Wt Readings from Last 15 Encounters:  10/20/17 146 lb (66.2 kg)  07/07/17 146 lb 9.6 oz (66.5 kg)  06/26/17 139 lb 15.9 oz (63.5 kg)  06/09/17 145 lb 1.9 oz (65.8 kg)  02/21/17 134 lb 0.6 oz (60.8 kg)  02/08/17 147 lb 12.8 oz (67 kg)  11/11/16 142 lb (64.4 kg)  10/22/16 144 lb (65.3 kg)  10/20/16 147 lb (66.7 kg)  10/16/16 147 lb 6.4 oz (66.9 kg)  10/04/16 142 lb 6.7 oz (64.6 kg)  09/25/16 144 lb (65.3 kg)  09/16/16 144 lb (65.3 kg)  08/31/16 144 lb (65.3 kg)  08/19/16 144 lb (65.3 kg)    Body mass index is 24.3 kg/m. Patient meets criteria for Normal based on current BMI.   Current diet order is Renal/Carbohydrate Modified. Labs and medications reviewed. CBG 204.  No nutrition interventions warranted at this time. If nutrition issues arise, please consult RD.   Arthur Holms, RD, LDN Pager #: [phone] After-Hours Pager #: [phone]

## 2017-10-21 NOTE — Progress Notes (Signed)
Subjective:  No acute events overnight.  Patient was transitioned to supplemental oxygen from BiPAP overnight.  She is satting well on 2 L at rest but desats on exertion and with deep breathing.  This morning she continues to feel tired and states she is feeling okay.  Denied any complaints.  Discussed with patient we are treating her for influenza and that we will call family to update them.  All questions answered.  Objective:  Vital signs in last 24 hours: Vitals:   10/21/17 0000 10/21/17 0401 10/21/17 0432 10/21/17 0500  BP: (!) 184/64 (!) 176/67  (!) 156/61  Pulse: 62 62 (!) 57 (!) 57  Resp: 20 16 14 14   Temp:   98.1 F (36.7 C)   TempSrc:   Axillary   SpO2: 100% 98% 100% 100%  Weight:      Height:       Physical Exam  Constitutional:  Chronically ill-appearing elderly female, appears tired and somnolent but easily arousable to voice, in no acute distress  Cardiovascular:  Heart sounds are distant and difficult to appreciate, JVD 1-2 cm above clavicle  Pulmonary/Chest:  Good air movement (improved from yesterday), diffuse wheezes, no crackles, no increased work of breathing while on 2L of supplemental oxygen but desats to low 80s on exertion  Abdominal: Soft. Bowel sounds are normal. She exhibits no distension. There is no tenderness.  Musculoskeletal:  Trace lower extremity pitting edema bilaterally.  Extremities are warm and well perfused  Neurological:  Somnolent but easily arousable to voice, oriented to self and place, no focal deficits noted (patient is able to sit up with assistance)    Assessment/Plan:  Active Problems:   Encephalopathy  # Influenza A # Encephalopathy  Patient presented with 3-day history of worsening encephalopathy and new oxygen requirements. Flu test positive for Influenza A and tamiflu started today (renally dosed).  She is now off BiPAP and on 2 L of oxygen and oxygenating well.  However initially does desat with exertion to low 80s.  She  was also hypervolemic on exam and on admission due to missing HD 2 days ago.  She went for emergent dialysis yesterday due to hyperkalemia with EKG changes with marked improvement in volume status. K 3.8 today.  She continues to have JVD and wheezing on exam though much improved.  We will try to duonebs today and attempt to wean supplemental oxygen as tolerated.  We will discontinue vancomycin and aztreonam given low suspicion for bacterial infection as no focal infiltrates on chest x-ray.  Will consider resuming antibiotics if patient decompensates.  - Discontinue vancomycin and aztreonam  - Tamiflu renally dosed per pharmacy, appreciate help  - Duonebs q4h PRN - Continue supplemental oxygen, wean as tolerated   # Afib Patient is currently on Coumadin for persistent Afib and severe aortic stenosis s/p TAVR. Her INR goal is 2-3. INR 3.1 on admission(at goal). Her FOBT is positive, however family reports no changes in bowel movement. She does have dark stools at baseline since starting iron therapy. She does have a history of H pylori treated in 06/2017. Will continue to monitor for signs/symptoms of active bleeding.  - Continue ferrous sulfate 325 mg QD - Resume home Coumadintoday 1/31  # ESRD # HTN Patient was hypervolemic on presentation and hypertensive. K 6.5 withprolonged intervals onEKG changes.  She went for emergent HD yesterday with 3L removed. K 3.8 today. Her volume status is much improved today.  No weight recorded today.  She was transitioned  to supplemental oxygen overnight and is oxygenating well on 2 L at rest, but desats to low 80s on exertion. Will continue to wean as tolerated.  - Nephrology following, appreciate recommendations - On telemetry  - On 2L O2, wean as tolerated  - Continue ferrous sulfate 325 mg QD  - Continue home Phoslo 667 mg QD  # Insulin-dependent T2DM: A1c 8.5 09/2016. On Toujeo 10 units QHS.  - s/p Novolog 10 units in the ED for hyperkalemia    -Holding insulin as patient was NPO due to being on BiPAP, will start SSI-S if hyperglycemic  # HFpEF # Severe aortic stenosis: s/p TAVR 1/2018with INR goal 2-3 TTE 10/2016 with EF 55 %, no wall motion abnormalities, and G2DD. BNP >4,500 on admission, no prior to compare. S/p emergent HD yesterday 1/30.  Mildly hypervolemic on exam.  - Continue ASA 81 mg QD   Dispo: Anticipated discharge in approximately 2-3 day(s) pending improvement in respiratory status.   Welford Roche, MD 10/21/2017, 6:36 AM Pager: [phone]

## 2017-10-21 NOTE — H&P (Signed)
Date: 10/20/2017               Patient Name:  Angela Trujillo MRN: [id number]  DOB: 1931-08-05 Age / Sex: 84 y.o., female   PCP: Glendon Axe, MD         Medical Service: Internal Medicine Teaching Service         Attending Physician: Dr. Aldine Contes, MD    First Contact: Dr. [name] Pager: [phone]  Second Contact: Dr. Hetty Ely Pager: [phone]       After Hours (After 5p/  First Contact Pager: [phone]  weekends / holidays): Second Contact Pager: [phone]   Chief Complaint: Altered mental status   History of Present Illness:  Angela Trujillo is an 84 year old female with history of ESRD on HD T/TH/S, insulin-dependent T2 DM, Afib on Coumadin, HTN, and severe aortic stenosis s/p TAVR on 09/29/2016 who presents to the ED with a 2-3-day history of altered mental status patient is very lethargic and fatigued and unable to provide history.  Family at bedside.  Per son, patient was in usual state of health until Monday night when she expressed feeling tired and sleepy.  The next day patient continued to feel fatigued and had decreased p.o. intake and nausea.  She was also noted to dry heave several times throughout the day.  She missed HD on 1/29 due to dizziness.  Her last HD session was Saturday 1/26. Dry weight ~140 lbs. This morning patient was very lethargic and somnolent.  She was unable to eat breakfast and could not dress herself.  She lives at home with daughter and independent (unable to cope but able to perform all of her ADLs).  Family proceeded to call EMS. Per family, patient has not had fever, chills, chest pain, shortness of breath, cough, abdominal pain, emesis, urinary symptoms (makes small amount of urine), and changes in bowel movements. She does have dark stools since starting iron therapy for anemia. Received influenza vaccination in HD, but did not receive pneumococcal vaccine.   ED course: Patient was mildly hypothermic (T 97.5), HR 65, BP  176/66, and oxygenating well on 2L of supplemental oxygen. She was on BiPAP when seen, though unclear why as there is no documented hypoxia. Labwork remarkable for WBC 11.3, Hgb 11.6, K 6.5, bicarb 14, Cr 9.9 and positive FOBT. INR 3.1. I-stat troponin negative. ABG 7.39/30/90/18. EKG ?SR without signs of ischemia. Head CT negative. CXR negative for acute processes. She received albuterol nebs, Novolog 10, and calcium gluconate x1.   Review of Systems: A complete ROS was negative except as per HPI.   Meds:  ActiveMedications      Current Meds  Medication Sig  . amiodarone (PACERONE) 200 MG tablet Take 1 tablet (200 mg total) by mouth daily. (Patient taking differently: Take 200 mg by mouth at bedtime. )  . aspirin 81 MG chewable tablet Chew 81 mg by mouth daily.  . calcium acetate (PHOSLO) 667 MG capsule Take 667 mg by mouth daily with breakfast.  . cloNIDine (CATAPRES) 0.1 MG tablet TAKE 1 TABLET BY MOUTH TWICE DAILY (Patient taking differently: TAKE 0.1 mg TABLET BY MOUTH evening)  . ferrous sulfate 325 (65 FE) MG tablet Take 325 mg by mouth 2 (two) times daily with a meal.   . Insulin Glargine (TOUJEO SOLOSTAR [place]) Inject 10 Units into the skin at bedtime.  . metoprolol succinate (TOPROL-XL) 100 MG 24 hr tablet Take 100 mg by mouth daily.  . multivitamin (RENA-VIT) TABS tablet Take 1  tablet by mouth at bedtime.  . ondansetron (ZOFRAN) 4 MG tablet Take 1 tablet (4 mg total) by mouth 2 (two) times daily. Take if needed for nausea or vomiting  . SENSIPAR 30 MG tablet Take 30 mg by mouth Every Tuesday,Thursday,and Saturday with dialysis.   Marland Kitchen warfarin (COUMADIN) 5 MG tablet Take 1 tablet (5 mg total) by mouth daily. Resume usual dosing October 12. (Patient taking differently: Take 5 mg by mouth See admin instructions. 5 mg on Tues, thurs and Saturday Take 2.5 mg on Sun. Mon. Wed. Friday)       Allergies:      Allergies as of 10/20/2017 - Review Complete 10/20/2017  Allergen Reaction  Noted  . Asa [aspirin] Nausea Only and Other (See Comments) 04/14/2013  . Codeine Rash 04/14/2013  . Penicillins Rash 04/14/2013       Past Medical History:  Diagnosis Date  . Atrial fibrillation with RVR ([place]) 10/22/2016  . Atrial flutter ([place])   . Diabetes mellitus with end stage renal disease ([place])   . Dyspnea    with exertion  . ESRD (end stage renal disease) on dialysis ([place])    HD on T,T, Sa  . GERD (gastroesophageal reflux disease)   . Hypertension   . Pneumonia 10/2015  . Severe aortic stenosis 08/19/2016   s/p TAVR 09/2016 // b. Echo 09/30/16: EF 50-55, inf-septal AK, Gr 2 DD, normally functioning TAVR, mean AV gradient 15 mmHg, mod MAC, mild MS, mild MR, trivial effusion    Family History:       Family History  Problem Relation Age of Onset  . CAD Neg Hx     Social History:  Social History       Tobacco Use  . Smoking status: Never Smoker  . Smokeless tobacco: Never Used  Substance Use Topics  . Alcohol use: No  . Drug use: No     Physical Exam: Blood pressure (!) 159/65, pulse (!) 58, temperature 98.1 F (36.7 C), temperature source Rectal, resp. rate 17, SpO2 (!) 87 %.  Physical Exam  Constitutional: She appears well-developed.  Obese, somnolent, on BiPAP in no respiratory distress   HENT:  Head: Normocephalic and atraumatic.  Eyes: Conjunctivae and EOM are normal. Pupils are equal, round, and reactive to light.  Neck: Normal range of motion. Neck supple.  JVD difficult to assess (wearing BiPAP mask)  Cardiovascular:  Distant heart sounds  Pulmonary/Chest:  Diffuse wheezes, no crackles, comfortable on BiPAP  Abdominal: Soft. Bowel sounds are normal. She exhibits no distension. There is no tenderness. There is no guarding.  Musculoskeletal:  Extremities are cool to the touch, 1+ LE pitting edema bilaterally, 2+ DP pulses bilaterally, RUE AVG with palpable thrill and without signs of infection  Neurological:  Patient is  somnolent but easily arousable to voice, oriented x3 and able to follow commands   Skin:  No rashes, lesions, or pressure injuries noted     EKG: personally reviewed my interpretation is ?SR without signs of ischemia, PR, QRS and QTc prolongation   CXR: personally reviewed my interpretation is enlarged cardiac silhouette, aortic arch sclerosis, no opacities or effusions noted   Assessment & Plan by Problem: Active Problems:   Encephalopathy  # Encephalopathy: Patient presented with 2-3-day history of somnolence, nausea, and poor PO intake. Unclear etiology of AMS at this time. CXR and head CT negative ?Metabolic encephalopathy vs sepsis (unclear source of infection). Missed HD yesterday. However symptom onset was on day of last HD session, which  argues against uremia. She appears lethargic on exam and is on BiPAP though no documented hypoxia. Will wean as tolerated. Denies symptoms concerning for infection and no signs of infection on exam. Hyperkalemic on admission with prolonged intervals on EKG, though would not expect changes in mentation with this unless patient were having a cardiac arrhythmia from this which is unlikely given she is hemodynamically stable. She does have a mild transaminitis in 2:1 ratio, but BAL <10 and family reports patient does not drink alcohol. On review of chart, patient with questionable liver cirrhosis on imaging in 2017. Ammonia 61 though this does not correlate with degree of altered mentation.  - Continue vancomycin and aztreonam pending culture  - On BiPAP, wean as tolerated  - Repeat EKG  - Follow up Blood and urine cultures - Follow up influenza panel   - Emergent HD today for hyperkalemia     # Afib Patient is currently on Coumadin for persistent Afib and s/p TAVR. Her INR goal is 2-3 and she was found to have INR 3.1 on admission (at goal). Her FOBT is positive, however family reports no changes in bowel movement. She does have dark stools at  baseline since starting iron therapy. She does have a history of H pylori treated in 06/2017. Will continue to monitor for signs/symptoms of active bleeding.  - Continue ferrous sulfate 325 mg QD  - Holding home Coumadin   # ESRD # HTN Patient was hypervolemic on presentation and hypertensive. K 6.5 with prolonged intervals on EKG changes. S/p calcium gluconate x1. Nephrology consulted in the ED who recommended emergent dialysis. On clonidine 0.1 BID and compliant. Took medications this morning. Expect BP to improve with HD. Will continue to monitor BP and volume status.  - Nephrology following, appreciate recommendations - emergent HD today  - On telemetry  - On BiPAP, wean as tolerated  - Continue ferrous sulfate 325 mg QD  - Continue home Phoslo 667 mg QD   # Insulin-dependent T2DM: A1c 8.5 09/2016. On Toujeo 10 units QHS.  - s/p Novolog 10 units in the ED for hyperkalemia  - Holding insulin, may start SSI-S if hyperglycemic   # HFpEF  # Severe aortic stenosis: s/p TAVR 09/2016 with INR goal 2-3 TTE 10/2016 with EF 55 %, no wall motion abnormalities, and G2DD. Appears hypervolemic on exam. BNP >4,500, no prior to compare. Going for emergent HD today. INR at goal.  - Continue ASA 81 mg QD   F: none  E: monitoring  N: NPO while on BiPAP --> Renal+ CM  Diet  VTE ppx: None   Code status: Full code, confirmed with family on admission    Dispo: Admit patient to Inpatient with expected length of stay greater than 2 midnights.  SignedWelford Roche, MD 10/20/2017, 6:38 PM  Pager: [phone]

## 2017-10-21 NOTE — Care Management Note (Signed)
Case Management Note  Patient Details  Name: Angela Trujillo MRN: [id number] Date of Birth: [birth year]  Subjective/Objective:      From home with daughter, presents with  Encephalopathy,  Afib, has hx of ESRD, HTN,   DM2, HR, s/p TAVR 1/20.            Action/Plan: NCM will follow for dc needs.   Expected Discharge Date:                  Expected Discharge Plan:     In-House Referral:     Discharge planning Services  CM Consult  Post Acute Care Choice:    Choice offered to:     DME Arranged:    DME Agency:     HH Arranged:    HH Agency:     Status of Service:  In process, will continue to follow  If discussed at Long Length of Stay Meetings, dates discussed:    Additional Comments:  Zenon Mayo, RN 10/21/2017, 3:18 PM

## 2017-10-22 DIAGNOSIS — G9340 Encephalopathy, unspecified: Secondary | ICD-10-CM

## 2017-10-22 DIAGNOSIS — R1310 Dysphagia, unspecified: Secondary | ICD-10-CM

## 2017-10-22 DIAGNOSIS — R195 Other fecal abnormalities: Secondary | ICD-10-CM

## 2017-10-22 DIAGNOSIS — I132 Hypertensive heart and chronic kidney disease with heart failure and with stage 5 chronic kidney disease, or end stage renal disease: Secondary | ICD-10-CM

## 2017-10-22 DIAGNOSIS — I482 Chronic atrial fibrillation: Secondary | ICD-10-CM

## 2017-10-22 DIAGNOSIS — J111 Influenza due to unidentified influenza virus with other respiratory manifestations: Secondary | ICD-10-CM

## 2017-10-22 DIAGNOSIS — R5381 Other malaise: Secondary | ICD-10-CM

## 2017-10-22 DIAGNOSIS — D631 Anemia in chronic kidney disease: Secondary | ICD-10-CM

## 2017-10-22 DIAGNOSIS — L8990 Pressure ulcer of unspecified site, unspecified stage: Secondary | ICD-10-CM

## 2017-10-22 DIAGNOSIS — D696 Thrombocytopenia, unspecified: Secondary | ICD-10-CM

## 2017-10-22 LAB — RENAL FUNCTION PANEL
ALBUMIN: 2.7 g/dL — AB (ref 3.5–5.0)
Anion gap: 14 (ref 5–15)
BUN: 15 mg/dL (ref 6–20)
CALCIUM: 7.9 mg/dL — AB (ref 8.9–10.3)
CO2: 27 mmol/L (ref 22–32)
CREATININE: 4.7 mg/dL — AB (ref 0.44–1.00)
Chloride: 98 mmol/L — ABNORMAL LOW (ref 101–111)
GFR calc Af Amer: 9 mL/min — ABNORMAL LOW (ref >60)
GFR calc non Af Amer: 8 mL/min — ABNORMAL LOW (ref >60)
GLUCOSE: 85 mg/dL (ref 65–99)
PHOSPHORUS: 2.8 mg/dL (ref 2.5–4.6)
Potassium: 3.5 mmol/L (ref 3.5–5.1)
SODIUM: 139 mmol/L (ref 135–145)

## 2017-10-22 LAB — CBC
HCT: 37.6 % (ref 36.0–46.0)
HEMOGLOBIN: 12 g/dL (ref 12.0–15.0)
MCH: 28.5 pg (ref 26.0–34.0)
MCHC: 31.9 g/dL (ref 30.0–36.0)
MCV: 89.3 fL (ref 78.0–100.0)
PLATELETS: 91 10*3/uL — AB (ref 150–400)
RBC: 4.21 MIL/uL (ref 3.87–5.11)
RDW: 17.6 % — ABNORMAL HIGH (ref 11.5–15.5)
WBC: 7.1 10*3/uL (ref 4.0–10.5)

## 2017-10-22 LAB — GLUCOSE, CAPILLARY
Glucose-Capillary: 321 mg/dL — ABNORMAL HIGH (ref 65–99)
Glucose-Capillary: 352 mg/dL — ABNORMAL HIGH (ref 65–99)
Glucose-Capillary: 87 mg/dL (ref 65–99)

## 2017-10-22 LAB — PROTIME-INR
INR: 2.77
PROTHROMBIN TIME: 29 s — AB (ref 11.4–15.2)

## 2017-10-22 MED ORDER — AMIODARONE HCL 200 MG PO TABS
200.0000 mg | ORAL_TABLET | Freq: Every day | ORAL | Status: DC
  Administered 2017-10-22 – 2017-10-23 (×2): 200 mg via ORAL
  Filled 2017-10-22 (×2): qty 1

## 2017-10-22 MED ORDER — INSULIN ASPART 100 UNIT/ML ~~LOC~~ SOLN
0.0000 [IU] | Freq: Three times a day (TID) | SUBCUTANEOUS | Status: DC
  Administered 2017-10-22: 9 [IU] via SUBCUTANEOUS
  Administered 2017-10-23: 5 [IU] via SUBCUTANEOUS
  Administered 2017-10-24: 7 [IU] via SUBCUTANEOUS
  Administered 2017-10-24: 1 [IU] via SUBCUTANEOUS

## 2017-10-22 MED ORDER — CLONIDINE HCL 0.1 MG PO TABS
0.1000 mg | ORAL_TABLET | Freq: Every day | ORAL | Status: DC
  Administered 2017-10-22 – 2017-10-24 (×2): 0.1 mg via ORAL
  Filled 2017-10-22 (×2): qty 1

## 2017-10-22 MED ORDER — METOPROLOL SUCCINATE ER 100 MG PO TB24
100.0000 mg | ORAL_TABLET | Freq: Every day | ORAL | Status: DC
  Administered 2017-10-22 – 2017-10-24 (×2): 100 mg via ORAL
  Filled 2017-10-22 (×2): qty 1

## 2017-10-22 MED ORDER — WARFARIN SODIUM 2.5 MG PO TABS
2.5000 mg | ORAL_TABLET | Freq: Once | ORAL | Status: AC
  Administered 2017-10-22: 2.5 mg via ORAL
  Filled 2017-10-22: qty 1

## 2017-10-22 NOTE — Progress Notes (Signed)
ANTICOAGULATION CONSULT NOTE - Initial Consult  Pharmacy Consult for Warfarin Indication: atrial fibrillation  Allergies  Allergen Reactions  . Asa [Aspirin] Nausea Only and Other (See Comments)    MAKES STOMACH HURT  . Codeine Rash  . Penicillins Rash     Has patient had a PCN reaction causing immediate rash, facial/tongue/throat swelling, SOB or lightheadedness with hypotension:  #  #  #  NO  #  #  #  Has patient had a PCN reaction causing severe rash involving mucus membranes or skin necrosis:  #  #  #  NO  #  #  # Has patient had a PCN reaction that required hospitalization:  #  #  #  NO  #  #  #  Has patient had a PCN reaction occurring within the last 10 years:  #  #  #  NO  #  #  #  If all answers are "NO", may proceed with Cephalosporin   Vital Signs: Temp: 98.6 F (37 C) (02/01 0834) Temp Source: Oral (02/01 0834) BP: 171/61 (02/01 0834) Pulse Rate: 80 (02/01 0834)  Labs: Recent Labs    10/20/17 1520 10/20/17 1530 10/21/17 0023 10/21/17 2115 10/21/17 2116 10/22/17 0734  HGB 11.6* 13.3 12.3  --  11.2* 12.0  HCT 35.6* 39.0 37.9  --  33.8* 37.6  PLT 116*  --  104*  --  112* 91*  LABPROT 32.1*  --  30.9*  --   --  29.0*  INR 3.15  --  3.00  --   --  2.77  CREATININE 9.86* 10.40* 5.00* 6.40*  --   --     Estimated Creatinine Clearance: 5.9 mL/min (A) (by C-G formula based on SCr of 6.4 mg/dL (H)).  Medications:  Warfarin PTA 2.5mg  daily except 5mg  Tu/Th/Sat (last dose was 5mg  on 1/29)  Assessment: 86yof presented to ED for AMS. Of note, pt had decreased PO intake for the past several days.   INR is 2.77 today.  CBC stable except for a drop in plt from 112>91  HD patient  Goal of Therapy:  INR 2-3 Monitor platelets by anticoagulation protocol: Yes   Plan:  Warfarin 2.5 mg x 1 Daily INR Watch plt trend - may need to discuss with MD holding warfarin in event continues to drop  Levester Fresh, PharmD, BCPS, BCCCP Clinical Pharmacist Clinical phone for  10/22/2017 from 7a-3:30p: [phone] If after 3:30p, please call main pharmacy at: [phone] 10/22/2017 10:26 AM

## 2017-10-22 NOTE — Discharge Summary (Signed)
Name: Angela Trujillo MRN: [id number] DOB: [birth year] 84 y.o. PCP: Glendon Axe, MD  Date of Admission: 10/20/2017  2:42 PM Date of Discharge: 10/24/2017 Attending Physician: No att. providers found  Discharge Diagnosis: 1. Influenza A 2. Hyperkalemia 3. Encephalopathy  Principal Problem:   Influenza with respiratory manifestation Active Problems:   Hyperkalemia   Encephalopathy   Pressure injury of skin   Discharge Medications: Allergies as of 10/24/2017      Reactions   Asa [aspirin] Nausea Only, Other (See Comments)   MAKES STOMACH HURT   Codeine Rash   Penicillins Rash   Has patient had a PCN reaction causing immediate rash, facial/tongue/throat swelling, SOB or lightheadedness with hypotension:  #  #  #  NO  #  #  #  Has patient had a PCN reaction causing severe rash involving mucus membranes or skin necrosis:  #  #  #  NO  #  #  # Has patient had a PCN reaction that required hospitalization:  #  #  #  NO  #  #  #  Has patient had a PCN reaction occurring within the last 10 years:  #  #  #  NO  #  #  #  If all answers are "NO", may proceed with Cephalosporin      Medication List    STOP taking these medications   aspirin 81 MG chewable tablet   bismuth subsalicylate 423 MG chewable tablet Commonly known as:  PEPTO-BISMOL   metroNIDAZOLE 250 MG tablet Commonly known as:  FLAGYL   omeprazole 20 MG capsule Commonly known as:  PRILOSEC   pantoprazole 40 MG tablet Commonly known as:  PROTONIX   tetracycline 500 MG capsule Commonly known as:  ACHROMYCIN,SUMYCIN     TAKE these medications   amiodarone 200 MG tablet Commonly known as:  PACERONE Take 1 tablet (200 mg total) by mouth at bedtime.   calcium acetate 667 MG capsule Commonly known as:  PHOSLO Take 667 mg by mouth daily with breakfast.   cloNIDine 0.1 MG tablet Commonly known as:  CATAPRES TAKE 1 TABLET BY MOUTH TWICE DAILY What changed:    how much to take  how to take this  when to take  this   ferrous sulfate 325 (65 FE) MG tablet Take 325 mg by mouth 2 (two) times daily with a meal.   metoprolol succinate 100 MG 24 hr tablet Commonly known as:  TOPROL-XL Take 100 mg by mouth daily.   multivitamin Tabs tablet Take 1 tablet by mouth at bedtime.   ondansetron 4 MG tablet Commonly known as:  ZOFRAN Take 1 tablet (4 mg total) by mouth 2 (two) times daily. Take if needed for nausea or vomiting   SENSIPAR 30 MG tablet Generic drug:  cinacalcet Take 30 mg by mouth Every Tuesday,Thursday,and Saturday with dialysis.   TOUJEO SOLOSTAR [place] Inject 10 Units into the skin at bedtime.   warfarin 5 MG tablet Commonly known as:  COUMADIN Take as directed. If you are unsure how to take this medication, talk to your nurse or doctor. Original instructions:  Take 1 tablet (5 mg total) by mouth daily. Resume usual dosing October 12. What changed:    when to take this  additional instructions      Disposition and follow-up:   Angela Trujillo was discharged from [facility] in Good condition.  At the hospital follow up visit please address:  1.  Please assess respiratory status and  flu symptoms.  2.  Aspirin 81mg  daily was discontinued on discharge after reviewing Cardiology note 05/2017 and discharge summary 06/2017. Please assess for s/s of bleeding.  3.  Labs / imaging needed at time of follow-up: CBC to f/u Hb and platelets  4.  Pending labs/ test needing follow-up: BCx  Follow-up Appointments: Follow-up Information    Glendon Axe, MD. Schedule an appointment as soon as possible for a visit in 2 week(s).   Specialty:  Family Medicine Contact information: [address] [place] [address] [place] Hospital Course by problem list: Principal Problem:   Influenza with respiratory manifestation Active Problems:   Hyperkalemia   Encephalopathy   Pressure injury of skin   1. Influenza A, Encephalopathy Patient admitted on  1/30 with 3 days of worsening encephalopathy and new oxygen requirement, found to be positive for Influenza A. She was treated with oseltamivir (renally dosed per pharmacy) and supportive therapy, with improvement in her AMS, respiratory symptoms, and oxygen saturation. CXR without evidence of underlying PNA. PT/OT recommended [facility] PT[facility] and she was discharged with home health services. Daughter stated she was able to stay in the area for another ~2 weeks to help out while patient recovering.  2. ESRD on HD TuThSa, Hyperkalemia On admission, patient was hyperkalemic with EKG changes, which resolved after emergent HD on 1/30 and another HD session on 1/31. Volume status improved throughout her hospital stay. She was then continued on her usual dialysis schedule per Nephrology recommendations.  3. HFpEF, Severe aortic stenosis s/p TAVR with INR goal 2-3 BNP >4500 on admission, no prior to compare. Received emergent HD 1/30 and 1/31. Volume status improved throughout admission. Aspirin discontinued per Cardiology note 05/2017 and discharge summary 06/2017. Warfarin continued throughout admission with INR goal 2-3.  3. Atrial fibrillation She remained rate-controlled throughout her hospitalization. Home metoprolol and amiodarone were initially held on admission. These were restarted prior to discharge. She remained on home warfarin with INR goal 2-3  4. FOBT positive, Anemia 2/2 ESRD, Thrombocytopenia No s/s of active bleeding and no change in BM. Has dark stools at baseline after starting iron therapy. She has a hx of H pylori and diverticular GI bleed treated in 06/2017. Hb was normal at discharge (11.6) and platelets decreased from 112 -> 91 -> 80. She was continued on home warfarin.  Discharge Vitals:   BP (!) 148/60 (BP Location: Left Arm)   Pulse 70   Temp 97.9 F (36.6 C) (Oral)   Resp 16   Ht 5\' 5"  (1.651 m)   Wt 139 lb 1.8 oz (63.1 kg)   SpO2 100%   BMI 23.15 kg/m   Pertinent Labs,  Studies, and Procedures:  CBC Latest Ref Rng & Units 10/24/2017 10/23/2017 10/22/2017  WBC 4.0 - 10.5 K/uL 7.9 6.9 7.1  Hemoglobin 12.0 - 15.0 g/dL 11.6(L) 11.6(L) 12.0  Hematocrit 36.0 - 46.0 % 35.0(L) 35.3(L) 37.6  Platelets 150 - 400 K/uL 73(L) 80(L) 91(L)   BMP Latest Ref Rng & Units 10/24/2017 10/23/2017 10/22/2017  Glucose 65 - 99 mg/dL 180(H) 80 85  BUN 6 - 20 mg/dL 22(H) 35(H) 15  Creatinine 0.44 - 1.00 mg/dL 4.84(H) 6.52(H) 4.70(H)  BUN/Creat Ratio 12 - 28 - - -  Sodium 135 - 145 mmol/L 132(L) 137 139  Potassium 3.5 - 5.1 mmol/L 4.1 3.9 3.5  Chloride 101 - 111 mmol/L 96(L) 98(L) 98(L)  CO2 22 - 32 mmol/L 23 25 27  Calcium 8.9 - 10.3 mg/dL 7.4(L) 7.9(L) 7.9(L)   FOBT positive Troponin 0.07 Lipase 29 BNP >4500 Ammonia 61 Influenza A positive Ethyl alcohol neg HBSAb reactive, HBSAg negative, HBC IgM negative BCx NGTD x4d  CXR 1/30 No active disease  CT head 1/30 1. No acute intracranial findings. 2. Stable atrophy and chronic periventricular white matter disease.  Discharge Instructions: Discharge Instructions    Call MD for:  difficulty breathing, headache or visual disturbances   Complete by:  As directed    Call MD for:  extreme fatigue   Complete by:  As directed    Call MD for:  persistant dizziness or light-headedness   Complete by:  As directed    Call MD for:  persistant nausea and vomiting   Complete by:  As directed    Call MD for:  severe uncontrolled pain   Complete by:  As directed    Call MD for:  temperature >100.4   Complete by:  As directed    Diet - low sodium heart healthy   Complete by:  As directed    Increase activity slowly   Complete by:  As directed      Signed: Colbert Ewing, MD 10/24/2017, 6:47 PM   Pager: Mamie Nick [phone]

## 2017-10-22 NOTE — Progress Notes (Signed)
HD tx completed @ 0010 w/o problem, UF goal met, blood rinsed back, VSS, report called to Riki Altes, RN

## 2017-10-22 NOTE — Progress Notes (Signed)
Medicine attending: I examined this patient today together with resident physician Dr. Colbert Ewing and I concur with her evaluation and management plan which we discussed together. 84 year old woman admitted on January 30 with increasing lethargy and confusion.  She has end-stage renal disease on dialysis.  She has chronic atrial fibrillation, severe aortic stenosis, and is now 1 year status post placement of an aortic valve.  She is on chronic anticoagulation.  Initial evaluation: Lethargic and confused.  Afebrile.  Hypertensive.  Oxygenating well.  Cardiomegaly but no acute infiltrate or effusion.  Presence of a prosthetic aortic valve noted.  CT brain with no acute changes. White count mildly elevated at 11,000.  Initial potassium 6.6.  BNP greater than 4500. She tested positive for influenza A.  Currently receiving Tamiflu and supportive care.  She was dialyzed on day of admission.  Mental status has improved significantly over the last 48 hours and she is now conversant, oriented to person, place, and year. Large amount of secretions in the oropharynx.  Lungs overall clear.  No focal neurologic deficit. Impression: Transient change in mental status secondary to acute influenza infection complicated by underlying end-stage renal disease on dialysis. Condition is improving.  Continue current management plan.

## 2017-10-22 NOTE — Evaluation (Signed)
Occupational Therapy Evaluation Patient Details Name: Angela Trujillo MRN: [id number] DOB: 1931/08/07 Today's Date: 10/22/2017    History of Present Illness Angela Trujillo is an 84 year old female with history of ESRD on HD T/TH/S, insulin-dependent T2 DM, Afib on Coumadin, HTN, and severe aortic stenosis s/p TAVR on 09/29/2016 who presents to the ED with a 2-3-day history of altered mental status.  Missed dialysis and was found to be influenza A positive and was started on Bipap.   Clinical Impression   Pt was ambulating independently within her home. She was bathing herself, but assisted for LB dressing. Her family helps with IADL and transports her to HD. Pt presents with generalized weakness, decreased activity tolerance and impaired balance. She requires min assist for OOB mobility and set up to max assist for ADL. Her VS were stable on RA throughout session. Daughter arrived and stated the family can provide 24 hour assist at home. Recommending home with [facility]. Will follow.     Follow Up Recommendations  Home health OT    Equipment Recommendations  None recommended by OT    Recommendations for Other Services       Precautions / Restrictions Precautions Precautions: Fall Restrictions Weight Bearing Restrictions: No      Mobility Bed Mobility Overal bed mobility: Needs Assistance Bed Mobility: Supine to Sit     Supine to sit: Min guard     General bed mobility comments: increased time  Transfers Overall transfer level: Needs assistance Equipment used: Rolling walker (2 wheeled) Transfers: Sit to/from Stand Sit to Stand: Min assist         General transfer comment: min assist to rise and steady, cues for hand placement, performed from chair and BSC    Balance Overall balance assessment: Needs assistance   Sitting balance-Leahy Scale: Fair       Standing balance-Leahy Scale: Poor Standing balance comment: needs at least one hand support in standing for balance                           ADL either performed or assessed with clinical judgement   ADL Overall ADL's : Needs assistance/impaired Eating/Feeding: Set up;Sitting   Grooming: Wash/dry hands;Wash/dry face;Sitting;Set up   Upper Body Bathing: Minimal assistance;Sitting   Lower Body Bathing: Maximal assistance;Sit to/from stand   Upper Body Dressing : Minimal assistance;Sitting   Lower Body Dressing: Maximal assistance;Sit to/from stand   Toilet Transfer: Minimal assistance;Ambulation;RW;BSC(over toilet)   Toileting- Clothing Manipulation and Hygiene: Moderate assistance;Sit to/from stand       Functional mobility during ADLs: Minimal assistance;Rolling walker General ADL Comments: pt with stable VS throughout session on RA     Vision Patient Visual Report: No change from baseline       Perception     Praxis      Pertinent Vitals/Pain Pain Assessment: No/denies pain     Hand Dominance Left   Extremity/Trunk Assessment Upper Extremity Assessment Upper Extremity Assessment: Generalized weakness   Lower Extremity Assessment Lower Extremity Assessment: Defer to PT evaluation   Cervical / Trunk Assessment Cervical / Trunk Assessment: Kyphotic   Communication Communication Communication: HOH   Cognition Arousal/Alertness: Awake/alert Behavior During Therapy: Flat affect Overall Cognitive Status: Impaired/Different from baseline Area of Impairment: Problem solving                             Problem Solving: Slow processing;Decreased initiation;Requires verbal cues  General Comments       Exercises     Shoulder Instructions      Home Living Family/patient expects to be discharged to:: Private residence Living Arrangements: Alone Available Help at Discharge: Family;Available 24 hours/day Type of Home: House Home Access: Stairs to enter [employer] of Steps: 1+1   Home Layout: One level     Bathroom Shower/Tub:  Teacher, early years/pre: Standard     Home Equipment: Bedside commode;Walker - 4 wheels;Walker - 2 wheels;Shower seat          Prior Functioning/Environment Level of Independence: Needs assistance  Gait / Transfers Assistance Needed: walked household distances without a device ADL's / Homemaking Assistance Needed: family assisted with IADL and LB dressing   Comments: daughter confirming pt's report        OT Problem List: Decreased strength;Impaired balance (sitting and/or standing);Decreased cognition;Decreased knowledge of use of DME or AE      OT Treatment/Interventions: Self-care/ADL training;DME and/or AE instruction;Patient/family education;Balance training;Cognitive remediation/compensation;Therapeutic activities    OT Goals(Current goals can be found in the care plan section) Acute Rehab OT Goals Patient Stated Goal: get stronger OT Goal Formulation: With patient Time For Goal Achievement: 11/05/17 Potential to Achieve Goals: Good ADL Goals Pt Will Perform Grooming: with supervision;standing Pt Will Perform Upper Body Bathing: with set-up;sitting Pt Will Perform Upper Body Dressing: with set-up;sitting Pt Will Transfer to Toilet: with supervision;ambulating;bedside commode(over toilet) Pt Will Perform Toileting - Clothing Manipulation and hygiene: with supervision;sit to/from stand Additional ADL Goal #1: Pt will be aware of AE for LB bathing and dressing.  OT Frequency: Min 2X/week   Barriers to D/C:            Co-evaluation PT/OT/SLP Co-Evaluation/Treatment: Yes Reason for Co-Treatment: For patient/therapist safety   OT goals addressed during session: ADL's and self-care      AM-PAC PT "6 Clicks" Daily Activity     Outcome Measure Help from another person eating meals?: A Little Help from another person taking care of personal grooming?: A Little Help from another person toileting, which includes using toliet, bedpan, or urinal?: A Lot Help  from another person bathing (including washing, rinsing, drying)?: A Lot Help from another person to put on and taking off regular upper body clothing?: A Little Help from another person to put on and taking off regular lower body clothing?: A Lot 6 Click Score: 15   End of Session Equipment Utilized During Treatment: Gait belt;Rolling walker Nurse Communication: Mobility status(family wants pt bathed)  Activity Tolerance: Patient tolerated treatment well Patient left: in chair;with call bell/phone within reach;with chair alarm set;with family/visitor present  OT Visit Diagnosis: Unsteadiness on feet (R26.81);Other abnormalities of gait and mobility (R26.89);Muscle weakness (generalized) (M62.81);Other symptoms and signs involving cognitive function                Time: 5726-2035 OT Time Calculation (min): 23 min Charges:  OT General Charges $OT Visit: 1 Visit OT Evaluation $OT Eval Moderate Complexity: 1 Mod G-Codes:     Malka So 10/22/2017, 12:30 PM  10/22/2017 Nestor Lewandowsky, OTR/L Pager: [phone]

## 2017-10-22 NOTE — Progress Notes (Signed)
Subjective: Interval History: no complaints, enjoying breakfast. Does not feel SOB, on O2.   Objective: Vital signs in last 24 hours: Temp:  [98.1 F (36.7 C)-99.4 F (37.4 C)] 98.6 F (37 C) (02/01 0834) Pulse Rate:  [67-80] 80 (02/01 0834) Resp:  [14-26] 15 (02/01 0834) BP: (112-176)/(44-79) 171/61 (02/01 0834) SpO2:  [97 %-100 %] 100 % (02/01 0834) Weight:  [62.7 kg (138 lb 3.7 oz)-65.7 kg (144 lb 13.5 oz)] 62.7 kg (138 lb 3.7 oz) (02/01 0037) Weight change: -0.525 kg (-2.5 oz)  Intake/Output from previous day: 01/31 0701 - 02/01 0700 In: -  Out: 3001  Intake/Output this shift: No intake/output data recorded.  General appearance: elderly female resting in bed with [name] in place inNAD.  Neck: no adenopathy, no carotid bruit, supple, symmetrical, trachea midline, thyroid not enlarged, symmetric, no tenderness/mass/nodules and neck veins up Resp: coarse BS throughout but good air movement Cardio: irregularly irregular rhythm  Ext 1+ edema    Lab Results: Recent Labs    10/21/17 2116 10/22/17 0734  WBC 8.6 7.1  HGB 11.2* 12.0  HCT 33.8* 37.6  PLT 112* 91*   BMET:  Recent Labs    10/21/17 0023 10/21/17 2115  NA 140 135  K 3.8 4.3  CL 98* 95*  CO2 25 25  GLUCOSE 107* 224*  BUN 19 31*  CREATININE 5.00* 6.40*  CALCIUM 8.5* 8.0*   No results for input(s): PTH in the last 72 hours. Iron Studies: No results for input(s): IRON, TIBC, TRANSFERRIN, FERRITIN in the last 72 hours.  Studies/Results: Ct Head Wo Contrast  Result Date: 10/20/2017 CLINICAL DATA:  83 year old with altered mental status for 2 days. History of hypertension and diabetes. EXAM: CT HEAD WITHOUT CONTRAST TECHNIQUE: Contiguous axial images were obtained from the base of the skull through the vertex without intravenous contrast. COMPARISON:  CT head 10/02/2016. FINDINGS: Brain: There is no evidence of acute intracranial hemorrhage, mass lesion, brain edema or extra-axial fluid collection. There is  stable generalized atrophy with prominence of the ventricles and subarachnoid spaces. Mild periarticular white matter disease appears unchanged. There is no CT evidence of acute cortical infarction. Stable fat density along the interhemispheric fissure. Vascular: Intracranial vascular calcifications. No hyperdense vessel identified. Skull: Negative for fracture or focal lesion. Sinuses/Orbits: Stable mucosal thickening in the sphenoid sinus. The additional paranasal sinuses are clear without air-levels. The mastoid air cells and middle ears are clear. No orbital abnormalities. Other: None. IMPRESSION: 1. No acute intracranial findings. 2. Stable atrophy and chronic periventricular white matter disease. Electronically Signed   By: Richardean Sale M.D.   On: 10/20/2017 17:22   Dg Chest Port 1 View  Result Date: 10/20/2017 CLINICAL DATA:  Altered mental status EXAM: PORTABLE CHEST 1 VIEW COMPARISON:  02/16/2017 FINDINGS: Cardiac shadow remains enlarged. Aortic calcifications are again seen. Changes consistent with prior TAVR are noted. The lungs are well aerated bilaterally. Very minimal interstitial changes are noted stable from the previous exam. No acute infiltrate or sizable effusion is noted. No bony abnormality is seen. IMPRESSION: No active disease. Electronically Signed   By: Inez Catalina M.D.   On: 10/20/2017 16:17    Assessment/Plan:  Angela Trujillo is an 84 yo female with PMH of diabetes mellitus, hypertension, atrial fibrillation on chronic anticoagulation therapy and end-stage renal disease on hemodialysis on a TTS schedule who was brought to the emergency room with altered mental status along with missing scheduled HD 2/2 AMS. On presentation, K 6.5 with mild volume overload.  Altered mental status:Improved, likely flu A contributing. Tamiflu per primary.   Hyperkalemia:Resolved  Hypertension: elevated BP. Per primary, consider restarting home med.   End-stage renal disease:last HD  10/21/17 able to tolerate full UF goal.  -resume home HD schedule TTS  Atrial fibrillation: with controlled ventricular response-on warfarin  S/p TAVR  ACD hgb stable, hold ESA  Dispo: considering CIR   LOS: 2 days   Angela Trujillo 10/22/2017,9:24 AM   Renal Attending: I agree with note above.  For HD in AM Angela Trujillo

## 2017-10-22 NOTE — Progress Notes (Signed)
Inpatient Rehabilitation  Per PT request, patient was screened by Yacob Wilkerson for appropriateness for an Inpatient Acute Rehab consult.  At this time we are recommending an Inpatient Rehab consult.  Please order if you are agreeable.    Karine Garn, M.A., CCC/SLP Admission Coordinator  [facility]  Cell [phone]  

## 2017-10-22 NOTE — Progress Notes (Addendum)
0700 Bedside shift report, pt sleeping, easy to arouse, NAD, pt had med size bowel movement, pt cleaned up and pad changed. Fall precautions in place, [facility].   0800 Pt assessed, see flow sheet. Pt very soft spoken, weak, stated she lives alone. Pt confused to time, reoriented. WCTM.   0900 Pt medicated per MAR. RN assisted pt with cutting up breakfast. Pt able to feed self.  1030 Pt weaned to room air, O2 sats in high 90s-100s. WCTM.   1115 Physical therapy walking pt in hallway and returned to chair for lunch. Pt tolerated well.  1230 Pt sitting up eating lunch visiting with family. NAD, no complaints.  1500 Pt returned to bed, RN assisted. Fall precautions in place, WCTM.   1600 Pt's BP still high, MD paged, new order received for clonidine. WCTM.   93 Pt resting comfortably, NAD, WCTM.   1900 Pt visiting with son at bedside, NAD, no complaints, updated with POC for tomorrow, pt and son understand without assistance. Awaiting night shift RN for bedside report.

## 2017-10-22 NOTE — Consult Note (Signed)
Physical Medicine and Rehabilitation Consult   Reason for Consult: Debility Referring Physician: Dr. Dareen Piano   HPI:  Angela Trujillo is a 84 y.o. female with ESRD, chronic coumadin,AS s/p TAVR who was admitted on 10/20/17 with respiratory distress and lethargy due to Influenza A and due to missing multiple HD sessions. She was started on Oseltamivir and underwent emergent HD sessions with improvement in respiratory status and mentation.  Therapy evaluation revealed functional deficits and CIR recommended for follow up therapy.    Review of Systems  Constitutional: Negative for chills and fever.  HENT: Negative for hearing loss.   Eyes: Negative for blurred vision and double vision.  Respiratory: Negative for cough and shortness of breath.   Cardiovascular: Negative for chest pain and palpitations.  Gastrointestinal: Negative for constipation, heartburn and nausea.  Musculoskeletal: Negative for back pain and myalgias.  Neurological: Positive for weakness. Negative for dizziness, focal weakness and headaches.  Psychiatric/Behavioral: Negative for depression. The patient is not nervous/anxious.     Past Medical History:  Diagnosis Date  . Atrial fibrillation with RVR ([place]) 10/22/2016  . Atrial flutter ([place])   . Diabetes mellitus with end stage renal disease ([place])   . Dyspnea    with exertion  . ESRD (end stage renal disease) on dialysis ([place])    HD on T,T, Sa  . GERD (gastroesophageal reflux disease)   . Hypertension   . Pneumonia 10/2015  . Severe aortic stenosis 08/19/2016   s/p TAVR 09/2016 // b. Echo 09/30/16: EF 50-55, inf-septal AK, Gr 2 DD, normally functioning TAVR, mean AV gradient 15 mmHg, mod MAC, mild MS, mild MR, trivial effusion    Past Surgical History:  Procedure Laterality Date  . ABDOMINAL HYSTERECTOMY    . CARDIAC CATHETERIZATION N/A 08/19/2016   Procedure: Right/Left Heart Cath and Coronary Angiography;  Surgeon: Sherren Mocha, MD;  Location: [place] CV LAB;  Service: Cardiovascular;  Laterality: N/A;  . COLONOSCOPY    . COLONOSCOPY N/A 06/25/2017   Procedure: COLONOSCOPY;  Surgeon: Ladene Artist, MD;  Location: [facility] ENDOSCOPY;  Service: Endoscopy;  Laterality: N/A;  . ESOPHAGOGASTRODUODENOSCOPY N/A 06/24/2017   Procedure: ESOPHAGOGASTRODUODENOSCOPY (EGD);  Surgeon: Ladene Artist, MD;  Location: [facility] ENDOSCOPY;  Service: Endoscopy;  Laterality: N/A;  . IR GENERIC HISTORICAL  08/31/2016   IR RADIOLOGY PERIPHERAL GUIDED IV START 08/31/2016 Corrie Mckusick, DO [facility]-INTERV RAD  . IR GENERIC HISTORICAL  08/31/2016   IR US GUIDE VASC ACCESS RIGHT 08/31/2016 Corrie Mckusick, DO [facility]  . MULTIPLE EXTRACTIONS WITH ALVEOLOPLASTY N/A 09/25/2016   Procedure: EXTRACTION of tooth number 31 WITH ALVEOLOPLASTY AND GROSS DEBRIDEMENT OF REMAINING TEETH;  Surgeon: Lenn Cal, DDS;  Location: [place];  Service: Oral Surgery;  Laterality: N/A;  . TEE WITHOUT CARDIOVERSION N/A 09/29/2016   Procedure: TRANSESOPHAGEAL ECHOCARDIOGRAM (TEE);  Surgeon: Sherren Mocha, MD;  Location: [place];  Service: Open Heart Surgery;  Laterality: N/A;  . TRANSCATHETER AORTIC VALVE REPLACEMENT, TRANSFEMORAL N/A 09/29/2016   Procedure: TRANSCATHETER AORTIC VALVE REPLACEMENT, TRANSFEMORAL;  Surgeon: Sherren Mocha, MD;  Location: [place];  Service: Open Heart Surgery;  Laterality: N/A;  . TUBAL LIGATION      Family History  Problem Relation Age of Onset  . CAD Neg Hx     Social History:  Lives alone. Independent PTA.  Family and friends assist with meals and transportation. She  reports that  has never smoked. she has never used smokeless tobacco. She reports that she does not drink  alcohol or use drugs.    Allergies  Allergen Reactions  . Asa [Aspirin] Nausea Only and Other (See Comments)    MAKES STOMACH HURT  . Codeine Rash  . Penicillins Rash     Has patient had a PCN reaction causing immediate rash, facial/tongue/throat swelling, SOB or lightheadedness with  hypotension:  #  #  #  NO  #  #  #  Has patient had a PCN reaction causing severe rash involving mucus membranes or skin necrosis:  #  #  #  NO  #  #  # Has patient had a PCN reaction that required hospitalization:  #  #  #  NO  #  #  #  Has patient had a PCN reaction occurring within the last 10 years:  #  #  #  NO  #  #  #  If all answers are "NO", may proceed with Cephalosporin    Medications Prior to Admission  Medication Sig Dispense Refill  . amiodarone (PACERONE) 200 MG tablet Take 1 tablet (200 mg total) by mouth daily. (Patient taking differently: Take 200 mg by mouth at bedtime. ) 30 tablet 11  . aspirin 81 MG chewable tablet Chew 81 mg by mouth daily.    . calcium acetate (PHOSLO) 667 MG capsule Take 667 mg by mouth daily with breakfast.    . cloNIDine (CATAPRES) 0.1 MG tablet TAKE 1 TABLET BY MOUTH TWICE DAILY (Patient taking differently: TAKE 0.1 mg TABLET BY MOUTH evening) 60 tablet 8  . ferrous sulfate 325 (65 FE) MG tablet Take 325 mg by mouth 2 (two) times daily with a meal.     . Insulin Glargine (TOUJEO SOLOSTAR [place]) Inject 10 Units into the skin at bedtime.    . metoprolol succinate (TOPROL-XL) 100 MG 24 hr tablet Take 100 mg by mouth daily.    . multivitamin (RENA-VIT) TABS tablet Take 1 tablet by mouth at bedtime.  0  . ondansetron (ZOFRAN) 4 MG tablet Take 1 tablet (4 mg total) by mouth 2 (two) times daily. Take if needed for nausea or vomiting 15 tablet 0  . SENSIPAR 30 MG tablet Take 30 mg by mouth Every Tuesday,Thursday,and Saturday with dialysis.     Marland Kitchen warfarin (COUMADIN) 5 MG tablet Take 1 tablet (5 mg total) by mouth daily. Resume usual dosing October 12. (Patient taking differently: Take 5 mg by mouth See admin instructions. 5 mg on Tues, thurs and Saturday Take 2.5 mg on Sun. Mon. Wed. Friday)    . bismuth subsalicylate (PEPTO-BISMOL) 262 MG chewable tablet Chew 2 tablets (524 mg total) by mouth 4 (four) times daily. Take for 14 days only. (Patient not taking:  Reported on 10/20/2017) 56 tablet 0  . metroNIDAZOLE (FLAGYL) 250 MG tablet Take 1 tablet (250 mg total) by mouth 4 (four) times daily. Take for 14 days only (Patient not taking: Reported on 10/20/2017) 56 tablet 0  . omeprazole (PRILOSEC) 20 MG capsule Take 1 capsule (20 mg total) by mouth 2 (two) times daily before a meal. Take for 14 days only. (Patient not taking: Reported on 10/20/2017) 28 capsule 0  . pantoprazole (PROTONIX) 40 MG tablet Take 1 tablet (40 mg total) by mouth daily at 6 (six) AM. (Patient not taking: Reported on 10/20/2017) 30 tablet 0  . tetracycline (ACHROMYCIN,SUMYCIN) 500 MG capsule Take 1 capsule (500 mg total) by mouth 4 (four) times daily. For 14 days only.  On DIALYSIS DAYS give AFTER dialysis. (Patient not taking:  Reported on 10/20/2017) 14 capsule 0    Home: Home Living Family/patient expects to be discharged to:: Private residence Living Arrangements: Alone(pt states alone, chart states daughter lives with her) Type of Home: House Home Access: Stairs to enter Technical brewer of Steps: 1+1 Home Layout: One level  Functional History: Prior Function Level of Independence: Independent with assistive device(s) Comments: states using cane Functional Status:  Mobility: Bed Mobility Overal bed mobility: Needs Assistance Bed Mobility: Supine to Sit, Sit to Supine Supine to sit: Max assist Sit to supine: +2 for physical assistance, Max assist General bed mobility comments: increased time to initiate or to participate due to lethargy, assisted with trunk and legs from sidelying to sit and RN in to assist to supine and scoot to [facility] Transfers Overall transfer level: Needs assistance Equipment used: 1 person hand held assist Transfers: Sit to/from Stand Sit to Stand: Max assist General transfer comment: lifting and lowering help, attempted for pt to take steps to [facility], but unable      ADL:    Cognition: Cognition Overall Cognitive Status: Difficult to  assess Orientation Level: Oriented to person, Oriented to place, Oriented to situation Cognition Arousal/Alertness: Lethargic Behavior During Therapy: Flat affect Overall Cognitive Status: Difficult to assess Difficult to assess due to: Level of arousal   Blood pressure (!) 142/54, pulse (!) 39, temperature 98.6 F (37 C), temperature source Oral, resp. rate 19, height 5' 5"  (1.651 m), weight 62.7 kg (138 lb 3.7 oz), SpO2 99 %. Physical Exam  Nursing note and vitals reviewed. Constitutional: She is oriented to person, place, and time. She appears well-developed and well-nourished. No distress.  Up in chair. NAD.  Frail-appearing  HENT:  Head: Normocephalic and atraumatic.  Mouth/Throat: Oropharynx is clear and moist.  Eyes: Conjunctivae and EOM are normal. Pupils are equal, round, and reactive to light.  Neck: Normal range of motion. Neck supple.  Cardiovascular: Normal rate and regular rhythm.  Respiratory: Effort normal. No stridor.  GI: Soft. Bowel sounds are normal. She exhibits no distension. There is no tenderness.  Musculoskeletal: She exhibits edema (BUE). She exhibits no tenderness.  Neurological: She is alert and oriented to person, place, and time.  Soft voice. Mild disorientation but able to answer biographic questions without difficulty. Slow to process but able to follow simple motor commands.  Upper extremity motor exam grossly 2+ to 3- out of 5 proximal to distal with inconsistent effort.  Lower extremities were 2- out of 5 hip flexion and knee extension and 2+-3- ankle dorsiflexion and plantar flexion.  Patient does sense pain and light touch.  Skin: Skin is warm and dry. She is not diaphoretic.  Psychiatric: Her affect is blunt. Her speech is delayed. She is slowed.    Results for orders placed or performed during the hospital encounter of 10/20/17 (from the past 24 hour(s))  Renal function panel     Status: Abnormal   Collection Time: 10/21/17  9:15 PM  Result  Value Ref Range   Sodium 135 135 - 145 mmol/L   Potassium 4.3 3.5 - 5.1 mmol/L   Chloride 95 (L) 101 - 111 mmol/L   CO2 25 22 - 32 mmol/L   Glucose, Bld 224 (H) 65 - 99 mg/dL   BUN 31 (H) 6 - 20 mg/dL   Creatinine, Ser 6.40 (H) 0.44 - 1.00 mg/dL   Calcium 8.0 (L) 8.9 - 10.3 mg/dL   Phosphorus 3.9 2.5 - 4.6 mg/dL   Albumin 2.6 (L) 3.5 - 5.0 g/dL  GFR calc non Af Amer 5 (L) >60 mL/min   GFR calc Af Amer 6 (L) >60 mL/min   Anion gap 15 5 - 15  CBC     Status: Abnormal   Collection Time: 10/21/17  9:16 PM  Result Value Ref Range   WBC 8.6 4.0 - 10.5 K/uL   RBC 3.82 (L) 3.87 - 5.11 MIL/uL   Hemoglobin 11.2 (L) 12.0 - 15.0 g/dL   HCT 33.8 (L) 36.0 - 46.0 %   MCV 88.5 78.0 - 100.0 fL   MCH 29.3 26.0 - 34.0 pg   MCHC 33.1 30.0 - 36.0 g/dL   RDW 17.8 (H) 11.5 - 15.5 %   Platelets 112 (L) 150 - 400 K/uL  Protime-INR     Status: Abnormal   Collection Time: 10/22/17  7:34 AM  Result Value Ref Range   Prothrombin Time 29.0 (H) 11.4 - 15.2 seconds   INR 2.77   CBC     Status: Abnormal   Collection Time: 10/22/17  7:34 AM  Result Value Ref Range   WBC 7.1 4.0 - 10.5 K/uL   RBC 4.21 3.87 - 5.11 MIL/uL   Hemoglobin 12.0 12.0 - 15.0 g/dL   HCT 37.6 36.0 - 46.0 %   MCV 89.3 78.0 - 100.0 fL   MCH 28.5 26.0 - 34.0 pg   MCHC 31.9 30.0 - 36.0 g/dL   RDW 17.6 (H) 11.5 - 15.5 %   Platelets 91 (L) 150 - 400 K/uL   Ct Head Wo Contrast  Result Date: 10/20/2017 CLINICAL DATA:  84 year old with altered mental status for 2 days. History of hypertension and diabetes. EXAM: CT HEAD WITHOUT CONTRAST TECHNIQUE: Contiguous axial images were obtained from the base of the skull through the vertex without intravenous contrast. COMPARISON:  CT head 10/02/2016. FINDINGS: Brain: There is no evidence of acute intracranial hemorrhage, mass lesion, brain edema or extra-axial fluid collection. There is stable generalized atrophy with prominence of the ventricles and subarachnoid spaces. Mild periarticular white  matter disease appears unchanged. There is no CT evidence of acute cortical infarction. Stable fat density along the interhemispheric fissure. Vascular: Intracranial vascular calcifications. No hyperdense vessel identified. Skull: Negative for fracture or focal lesion. Sinuses/Orbits: Stable mucosal thickening in the sphenoid sinus. The additional paranasal sinuses are clear without air-levels. The mastoid air cells and middle ears are clear. No orbital abnormalities. Other: None. IMPRESSION: 1. No acute intracranial findings. 2. Stable atrophy and chronic periventricular white matter disease. Electronically Signed   By: Richardean Sale M.D.   On: 10/20/2017 17:22   Dg Chest Port 1 View  Result Date: 10/20/2017 CLINICAL DATA:  Altered mental status EXAM: PORTABLE CHEST 1 VIEW COMPARISON:  02/16/2017 FINDINGS: Cardiac shadow remains enlarged. Aortic calcifications are again seen. Changes consistent with prior TAVR are noted. The lungs are well aerated bilaterally. Very minimal interstitial changes are noted stable from the previous exam. No acute infiltrate or sizable effusion is noted. No bony abnormality is seen. IMPRESSION: No active disease. Electronically Signed   By: Inez Catalina M.D.   On: 10/20/2017 16:17    Assessment/Plan: Diagnosis: Severe deconditioning and encephalopathy related to influenza A and uremia 1. Does the need for close, 24 hr/day medical supervision in concert with the patient's rehab needs make it unreasonable for this patient to be served in a less intensive setting? Yes 2. Co-Morbidities requiring supervision/potential complications: ESRD on HD,  3. Due to bladder management, bowel management, safety, skin/wound care, disease management, medication administration, pain  management and patient education, does the patient require 24 hr/day rehab nursing? Yes 4. Does the patient require coordinated care of a physician, rehab nurse, PT (1-2 hrs/day, 5 days/week), OT (1-2 hrs/day, 5  days/week) and SLP (1-2 hrs/day, 5 days/week) to address physical and functional deficits in the context of the above medical diagnosis(es)? Yes and Potentially Addressing deficits in the following areas: balance, endurance, locomotion, strength, transferring, bowel/bladder control, bathing, dressing, feeding, grooming, toileting, cognition and psychosocial support 5. Can the patient actively participate in an intensive therapy program of at least 3 hrs of therapy per day at least 5 days per week? Potentially 6. The potential for patient to make measurable gains while on inpatient rehab is good 7. Anticipated functional outcomes upon discharge from inpatient rehab are supervision  with PT, supervision with OT, modified independent and supervision with SLP. 8. Estimated rehab length of stay to reach the above functional goals is: 13-19 days 9. Anticipated D/C setting: Home 10. Anticipated post D/C treatments: [place] therapy 11. Overall Rehab/Functional Prognosis: good  RECOMMENDATIONS: This patient's condition is appropriate for continued rehabilitative care in the following setting: CIR Patient has agreed to participate in recommended program. Yes Note that insurance prior authorization may be required for reimbursement for recommended care.  Comment: Rehab Admissions Coordinator to follow up for increased activity tolerance.  Thanks,  Meredith Staggers, MD, Mellody Drown    Bary Leriche, PA-C 10/22/2017

## 2017-10-22 NOTE — Progress Notes (Signed)
Noted updated therapy progress is now recommending Home health with family stating they can provide 24/7 assist at home. Pt is min to min guard 200 feet with RW with PT today. [phone]

## 2017-10-22 NOTE — Progress Notes (Signed)
Subjective:  Angela Trujillo was seen resting in bed comfortably. She denies chest pain, shortness of breath, fever, or chills.  Objective:  Vital signs in last 24 hours: Vitals:   10/22/17 0504 10/22/17 0834 10/22/17 1000 10/22/17 1030  BP:  (!) 171/61 (!) 142/54   Pulse:  80 (!) 39   Resp:  15 19   Temp: 98.6 F (37 C) 98.6 F (37 C)    TempSrc: Oral Oral    SpO2:  100% 99% 99%  Weight:      Height:       GEN: Elderly female lying in bed in NAD. Alert and oriented x3. HENT: [place]/AT. Moist mucous membranes. No visible lesions. RESP: Coarse breath sounds. Mild wheezes in lower lung fields. No increased work of breathing. CV: Normal rate and regular rhythm. No murmurs, gallops, or rubs. Trace LE edema. EXT: Trace LE edema. Warm and well perfused. NEURO: Cranial nerves II-XII grossly intact. Able to lift all four extremities against gravity. No apparent audiovisual hallucinations. Speech fluent and appropriate. PSYCH: Patient is calm and pleasant. Appropriate affect. Well-groomed; speech is appropriate and on-subject.  Assessment/Plan:  Principal Problem:   Influenza with respiratory manifestation Active Problems:   Encephalopathy  Angela Trujillo is an 84yo female with PMH of ESRD on HD TTS, AS s/p TAVR 09/29/2016, atrial fibrillation (on warfarin), DM, and HTN who presents with 3 days of worsening encephalopathy and new oxygen requirement, found to be positive for Influenza A.  Influenza A Significant improvement in patient's symptoms. Denies SOB, chest pain, or fevers. Alert and oriented x3 on exam. Initially required BiPAP, now saturating well on 2L [facility]. No evidence of underlying pneumonia on CXR. PT recommending CIR. - Continue oseltamivir, renal dosing per pharmacy - Duonebs q4h PRN - Wean supplemental oxygen as tolerated - Sit up in chair as able - CIR consult placed  ESRD on HD TTS Hyperkalemia, resolved Hyperkalemic with EKG changes on admission, now resolved after  emergent dialysis on 1/30 and another HD session on 1/31. Volume status improved on exam today. - Nephrology consulted; appreciate their assistance - Will resume home HD schedule TThSa - Follow up RFP today - Continue home Phoslo 667mg  qday - Continue home ferrous sulfate 325mg  qday  Encephalopathy, improved Likely secondary to metabolic encephalopathy in setting of influenza A infection. Alert and oriented x3 this morning after receiving supportive therapy for flu and HD 1/30 and 1/31. - Continue to monitor - Continue ESRD and influenza management as above  Atrial fibrillation, rate controlled Currently on warfarin for paroxysmal atrial fibrillation and severe aortic stenosis s/p TAVR. INR goal is 2-3. INR 2.77 today(at goal). - Telemetry - Warfarin per pharmacy - Restart home metoprolol XL 100mg  daily - Restart home amiodarone 200mg  QHS  FOBT positive Anemia 2/2 ESRD Thrombocytopenia, plt 112 -> 91 Without s/s of active bleeding. No change in BM. Has dark stools at baseline since starting iron therapy. Hx of H pylori and diverticular GIB treated in 06/2017. Hb normal at 12. Plt 112 -> 91. - Continue ferrous sulfate 325 mg qday - CBC tomorrow AM - Warfarin per pharmacy  HTN BP 148/59, HR 77. Home regimen includes clonidine 0.1mg  daily, amiodarone 200mg  QHS, and metoprolol XL 100mg  daily. - Restart home metoprolol XL 100mg  daily - Restart home amiodarone 200mg  QHS - Continue holding home clonidine 0.1mg  daily, can restart if elevated BP  Insulin-dependent T2DM A1c 8.5 09/2016. On Toujeo 10 units QHS at home. CBG elevated to 224 this AM. Dypshagia 2  diet started. - SSI-S - CBG monitoring  HFpEF (EF 55% and G2DD on TTE 10/2016) Severe aortic stenosis, s/p TAVR 1/2018with INR goal 2-3 BNP >4,500 on admission, no prior to compare.S/p emergent HD 1/30 and another HD 1/31. Volume status improved since admission. - Discontinue aspirin 81mg  daily per Cardiology note 05/2017 and  discharge summary 06/2017. - Continue warfarin per pharmacy - INR goal 2-3  Dispo: Anticipated discharge in approximately 2-3 day(s).   Colbert Ewing, MD 10/22/2017, 11:11 AM Pager: Mamie Nick [phone]

## 2017-10-22 NOTE — Progress Notes (Signed)
HD tx initiated via 16Gx2 w/o problem, pull/push/flush equally w/o problem, VSS, will cont to monitor while on HD tx 

## 2017-10-22 NOTE — Progress Notes (Signed)
Physical Therapy Treatment Patient Details Name: Angela Trujillo MRN: [id number] DOB: April 20, 1931 Today's Date: 10/22/2017    History of Present Illness Angela Trujillo is an 84 year old female with history of ESRD on HD T/TH/S, insulin-dependent T2 DM, Afib on Coumadin, HTN, and severe aortic stenosis s/p TAVR on 09/29/2016 who presents to the ED with a 2-3-day history of altered mental status.  Missed dialysis and was found to be influenza A positive and was started on Bipap.    PT Comments    Pt admitted with above diagnosis. Pt currently with functional limitations due to balance and endurance deficits. Pt was able to ambulate on unit with RW with min guard assist.  Having most difficulty with sit to stand.  Pt family arrived nd she has 5 children and they are going to help her on d/c.  Will follow acutely.   Pt will benefit from skilled PT to increase their independence and safety with mobility to allow discharge to the venue listed below.     Follow Up Recommendations  Home health PT;Supervision/Assistance - 24 hour     Equipment Recommendations  Other (comment)(TBA)    Recommendations for Other Services       Precautions / Restrictions Precautions Precautions: Fall Restrictions Weight Bearing Restrictions: No    Mobility  Bed Mobility Overal bed mobility: Needs Assistance Bed Mobility: Supine to Sit     Supine to sit: Min guard     General bed mobility comments: increased time, slow response time  Transfers Overall transfer level: Needs assistance Equipment used: Rolling walker (2 wheeled) Transfers: Sit to/from Stand Sit to Stand: Min assist         General transfer comment: min assist to rise and steady, cues for hand placement, performed from chair and BSC  Ambulation/Gait Ambulation/Gait assistance: Min assist;Min guard;+2 safety/equipment Ambulation Distance (Feet): 200 Feet Assistive device: Rolling walker (2 wheeled) Gait Pattern/deviations: Step-through  pattern;Decreased stride length;Drifts right/left   Gait velocity interpretation: Below normal speed for age/gender General Gait Details: Pt ambulates safely with RW.  Occasional assist and cues for steering.    Stairs            Wheelchair Mobility    Modified Rankin (Stroke Patients Only)       Balance Overall balance assessment: Needs assistance Sitting-balance support: Feet supported Sitting balance-Leahy Scale: Fair     Standing balance support: Bilateral upper extremity supported;During functional activity Standing balance-Leahy Scale: Poor Standing balance comment: needs at least one hand support in standing for balance                            Cognition Arousal/Alertness: Awake/alert Behavior During Therapy: Flat affect Overall Cognitive Status: Impaired/Different from baseline Area of Impairment: Problem solving                             Problem Solving: Slow processing;Decreased initiation;Requires verbal cues        Exercises      General Comments General comments (skin integrity, edema, etc.): Pt needed some help cleaning after having BM.      Pertinent Vitals/Pain Pain Assessment: No/denies pain  VSS  Home Living Family/patient expects to be discharged to:: Private residence Living Arrangements: Alone Available Help at Discharge: Family;Available 24 hours/day Type of Home: House Home Access: Stairs to enter   Home Layout: One level Home Equipment: Bedside commode;Walker - 4 wheels;Walker - 2 wheels;Shower  seat      Prior Function Level of Independence: Needs assistance  Gait / Transfers Assistance Needed: walked household distances without a device ADL's / Homemaking Assistance Needed: family assisted with IADL and LB dressing Comments: daughter confirming pt's report   PT Goals (current goals can now be found in the care plan section) Acute Rehab PT Goals Patient Stated Goal: get stronger Progress towards  PT goals: Progressing toward goals    Frequency    Min 3X/week      PT Plan Discharge plan needs to be updated    Co-evaluation PT/OT/SLP Co-Evaluation/Treatment: Yes Reason for Co-Treatment: For patient/therapist safety;To address functional/ADL transfers PT goals addressed during session: Mobility/safety with mobility;Strengthening/ROM OT goals addressed during session: ADL's and self-care      AM-PAC PT "6 Clicks" Daily Activity  Outcome Measure  Difficulty turning over in bed (including adjusting bedclothes, sheets and blankets)?: Unable Difficulty moving from lying on back to sitting on the side of the bed? : Unable Difficulty sitting down on and standing up from a chair with arms (e.g., wheelchair, bedside commode, etc,.)?: Unable Help needed moving to and from a bed to chair (including a wheelchair)?: A Lot Help needed walking in hospital room?: Total Help needed climbing 3-5 steps with a railing? : Total 6 Click Score: 7    End of Session Equipment Utilized During Treatment: Gait belt Activity Tolerance: Patient limited by lethargy Patient left: with call bell/phone within reach;in chair;with chair alarm set;with family/visitor present Nurse Communication: Mobility status PT Visit Diagnosis: Muscle weakness (generalized) (M62.81);Other symptoms and signs involving the nervous system (R29.898);Other abnormalities of gait and mobility (R26.89)     Time: 0630-1601 PT Time Calculation (min) (ACUTE ONLY): 29 min  Charges:  $Gait Training: 8-22 mins                    G Codes:       [place] [phone] (pager)    Denice Paradise 10/22/2017, 1:56 PM

## 2017-10-23 ENCOUNTER — Other Ambulatory Visit: Payer: Self-pay

## 2017-10-23 ENCOUNTER — Encounter (HOSPITAL_COMMUNITY): Payer: Self-pay | Admitting: *Deleted

## 2017-10-23 LAB — RENAL FUNCTION PANEL
ALBUMIN: 2.4 g/dL — AB (ref 3.5–5.0)
Anion gap: 14 (ref 5–15)
BUN: 35 mg/dL — AB (ref 6–20)
CHLORIDE: 98 mmol/L — AB (ref 101–111)
CO2: 25 mmol/L (ref 22–32)
CREATININE: 6.52 mg/dL — AB (ref 0.44–1.00)
Calcium: 7.9 mg/dL — ABNORMAL LOW (ref 8.9–10.3)
GFR calc Af Amer: 6 mL/min — ABNORMAL LOW (ref >60)
GFR calc non Af Amer: 5 mL/min — ABNORMAL LOW (ref >60)
GLUCOSE: 80 mg/dL (ref 65–99)
POTASSIUM: 3.9 mmol/L (ref 3.5–5.1)
Phosphorus: 2.5 mg/dL (ref 2.5–4.6)
Sodium: 137 mmol/L (ref 135–145)

## 2017-10-23 LAB — CBC
HEMATOCRIT: 35.3 % — AB (ref 36.0–46.0)
Hemoglobin: 11.6 g/dL — ABNORMAL LOW (ref 12.0–15.0)
MCH: 29.5 pg (ref 26.0–34.0)
MCHC: 32.9 g/dL (ref 30.0–36.0)
MCV: 89.8 fL (ref 78.0–100.0)
PLATELETS: 80 10*3/uL — AB (ref 150–400)
RBC: 3.93 MIL/uL (ref 3.87–5.11)
RDW: 17.8 % — AB (ref 11.5–15.5)
WBC: 6.9 10*3/uL (ref 4.0–10.5)

## 2017-10-23 LAB — GLUCOSE, CAPILLARY
GLUCOSE-CAPILLARY: 278 mg/dL — AB (ref 65–99)
GLUCOSE-CAPILLARY: 303 mg/dL — AB (ref 65–99)
Glucose-Capillary: 66 mg/dL (ref 65–99)

## 2017-10-23 LAB — PROTIME-INR
INR: 2.35
Prothrombin Time: 25.5 seconds — ABNORMAL HIGH (ref 11.4–15.2)

## 2017-10-23 MED ORDER — WARFARIN SODIUM 5 MG PO TABS
5.0000 mg | ORAL_TABLET | Freq: Once | ORAL | Status: AC
  Administered 2017-10-23: 5 mg via ORAL
  Filled 2017-10-23: qty 1

## 2017-10-23 MED ORDER — IPRATROPIUM-ALBUTEROL 0.5-2.5 (3) MG/3ML IN SOLN
3.0000 mL | Freq: Four times a day (QID) | RESPIRATORY_TRACT | Status: DC
  Administered 2017-10-23 – 2017-10-24 (×3): 3 mL via RESPIRATORY_TRACT
  Filled 2017-10-23 (×3): qty 3

## 2017-10-23 NOTE — Progress Notes (Signed)
  Speech Language Pathology Treatment: Dysphagia  Patient Details Name: Angela Trujillo MRN: [id number] DOB: [birth year] Today's Date: 10/23/2017 Time: 8850-2774 SLP Time Calculation (min) (ACUTE ONLY): 10 min  Assessment / Plan / Recommendation Clinical Impression  Patient seen for follow-up today for dysphagia. Daughter present and assisting pt with oral care; pt now has top partial in place. Pt is fully alert and able to self-feed without assistance. SLP provided upgraded texture trials of regular solids. Pt with min lingual residue remaining after the swallow. Pt clears this with liquid wash (verbal cue required). No overt signs of aspiration noted, even with challenging via multiple consecutive straw sips of thin liquids. Recommend advancement to dys 3 (mechanical soft) solids, continue thin liquids, medications per pt preference (whole in puree or with liquid). Pt/daughter in agreement. Will f/u for tolerance and possible advancement to regular solids.    HPI HPI: Angela Trujillo is an 84 year old female with history of ESRD on HD T/TH/S, insulin-dependent T2 DM,Afib on Coumadin,HTN, andsevere aortic stenosis s/p TAVR on 09/29/2016 who presents to the ED on (1/30) with a 2-3-day history of altered mental status patient is very lethargic and fatigued. Past swallowing assessment completed at other hospital, MBS completed with no aspiration/penetration and recommendation for thin liquids and Dys 3 solids. Pt was downgraded to Dys 1, per pt request. BSE ordered secondary to pt's lethargy this AM and hx of dysphagia/PNA and reduced respiratory status.       SLP Plan  Continue with current plan of care       Recommendations  Diet recommendations: Dysphagia 3 (mechanical soft);Thin liquid Liquids provided via: Cup;Straw Medication Administration: Whole meds with puree Supervision: Patient able to self feed;Intermittent supervision to cue for compensatory strategies Compensations: Minimize  environmental distractions Postural Changes and/or Swallow Maneuvers: Seated upright 90 degrees                Oral Care Recommendations: Oral care BID Follow up Recommendations: None SLP Visit Diagnosis: Dysphagia, oropharyngeal phase (R13.12) Plan: Continue with current plan of care       Angela Trujillo, Angela Trujillo, Angela Trujillo Pathologist Angela Trujillo 10/23/2017, 4:44 PM

## 2017-10-23 NOTE — Plan of Care (Signed)
D/w pt inpt trmt expectations, goals and routine.

## 2017-10-23 NOTE — Progress Notes (Signed)
[facility] for Warfarin Indication: atrial fibrillation  Allergies  Allergen Reactions  . Asa [Aspirin] Nausea Only and Other (See Comments)    MAKES STOMACH HURT  . Codeine Rash  . Penicillins Rash     Has patient had a PCN reaction causing immediate rash, facial/tongue/throat swelling, SOB or lightheadedness with hypotension:  #  #  #  NO  #  #  #  Has patient had a PCN reaction causing severe rash involving mucus membranes or skin necrosis:  #  #  #  NO  #  #  # Has patient had a PCN reaction that required hospitalization:  #  #  #  NO  #  #  #  Has patient had a PCN reaction occurring within the last 10 years:  #  #  #  NO  #  #  #  If all answers are "NO", may proceed with Cephalosporin   Vital Signs: Temp: 97.8 F (36.6 C) (02/02 0715) Temp Source: Oral (02/02 0715) BP: 112/55 (02/02 1100) Pulse Rate: 71 (02/02 1100)  Labs: Recent Labs    10/21/17 0023 10/21/17 2115 10/21/17 2116 10/22/17 0734 10/23/17 0834 10/23/17 1519  HGB 12.3  --  11.2* 12.0 11.6*  --   HCT 37.9  --  33.8* 37.6 35.3*  --   PLT 104*  --  112* 91* 80*  --   LABPROT 30.9*  --   --  29.0*  --  25.5*  INR 3.00  --   --  2.77  --  2.35  CREATININE 5.00* 6.40*  --  4.70* 6.52*  --     Estimated Creatinine Clearance: 5.8 mL/min (A) (by C-G formula based on SCr of 6.52 mg/dL (H)).  Medications:  Warfarin PTA 2.5mg  daily except 5mg  Tu/Th/Sat (last dose was 5mg  on 1/29)  Assessment: 86yof presented to ED for AMS. Of note, pt had decreased PO intake for the past several days.   INR is 2.35  today.  CBC stable except for a drop in plt from 112>91  HD patient  Goal of Therapy:  INR 2-3 Monitor platelets by anticoagulation protocol: Yes   Plan:  Warfarin 5 mg x 1 Daily INR Watch plt trend - may need to discuss with MD holding warfarin in event continues to drop  Thank you Anette Guarneri, PharmD [phone] 10/23/2017 3:56 PM

## 2017-10-23 NOTE — Progress Notes (Addendum)
Subjective: Seen during HD, resting comfortably off oxygen. No complaints.  Objective:  Vital signs in last 24 hours: Vitals:   10/23/17 0800 10/23/17 0830 10/23/17 0900 10/23/17 0930  BP: (!) 171/75 (!) 172/80 (!) 167/72 (!) 152/74  Pulse: 70 68 69 69  Resp: 20 15 17 18   Temp:      TempSrc:      SpO2:      Weight:      Height:       GEN: Elderly female lying in bed in NAD. Alert and oriented x3. HENT: [place]/AT. Moist mucous membranes. No visible lesions. RESP: Coarse breath sounds. Mild wheezes in all lung fields.  CV: Normal rate and regular rhythm. No murmurs, gallops, or rubs. Trace LE edema. EXT: Trace LE edema. Warm and well perfused. NEURO: Cranial nerves II-XII grossly intact. Able to lift all four extremities against gravity. No apparent audiovisual hallucinations. Speech fluent and appropriate. PSYCH: Patient is calm and pleasant. Appropriate affect. Well-groomed; speech is appropriate and on-subject.  Assessment/Plan:  Ms. Velie is an 84yo female with PMH of ESRD on HD TTS, AS s/p TAVR 09/29/2016, atrial fibrillation (on warfarin), DM, and HTN who presents with 3 days of worsening encephalopathy and new oxygen requirement, found to be positive for Influenza A.  Influenza A Significantly improved clinically. Encephalopathy has resolved, no longer requiring oxygen. Does have persistent rales and expiratory wheezing but no evidence of underlying pneumonia on CXR. PT recommending CIR. - Continue oseltamivir, renal dosing per pharmacy - Duonebs q4h PRN --> schedule q6h today for wheezing  - DC to CIR when bed available   ESRD on HD TTS Hyperkalemia, resolved Hyperkalemic with EKG changes on admission, now resolved after emergent dialysis on 1/30 and another HD session on 1/31. Volume status improved on exam today. Normal T/Th/Sat scheduled resumed today.  - Nephrology consulted; appreciate their assistance - Continue home Phoslo 667mg  qday - Continue home ferrous  sulfate 325mg  qday  Encephalopathy, resolved  Likely metabolic in the setting of influenza A infection. Alert and oriented x3 this morning after receiving supportive therapy for flu and HD 1/30 and 1/31. - Continue to monitor - Continue ESRD and influenza management as above  Atrial fibrillation, rate controlled Currently on warfarin for paroxysmal atrial fibrillation andsevere aortic stenosiss/p TAVR. INR goal is 2-3.INR 2.77 today(at goal). - Telemetry -Warfarin per pharmacy - Restart home metoprolol XL 100mg  daily - Restart home amiodarone 200mg  QHS  FOBT positive Anemia 2/2 ESRD Thrombocytopenia, plt 112 -> 91 Without s/s of active bleeding. No change in BM. Has dark stools at baseline since starting iron therapy. Hx of H pylori and diverticular GIB treated in 06/2017. Hb normal at 12. Plt 112 -> 91. - Continue ferrous sulfate 325 mg qday - CBC tomorrow AM - Warfarin per pharmacy  HTN BP 148/59, HR 77. Home regimen includes clonidine 0.1mg  daily, amiodarone 200mg  QHS, and metoprolol XL 100mg  daily. - Restart home metoprolol XL 100mg  daily - Restart home amiodarone 200mg  QHS - Continue holding home clonidine 0.1mg  daily, can restart if elevated BP  Insulin-dependent T2DM A1c 8.5 09/2016. On [place] 10 units QHS at home. CBG elevated to 224 this AM. Dypshagia 2 diet started. - SSI-S - CBG monitoring  HFpEF (EF 55% and G2DD on TTE 10/2016) Severe aortic stenosis, s/p TAVR 1/2018with INR goal 2-3 BNP >4,500on admission, no prior to compare.S/pemergent HD 1/30 and another HD 1/31. Volume status improved since admission. - Discontinue aspirin 81mg  daily per Cardiology note 05/2017 and discharge  summary 06/2017. - Continue warfarin per pharmacy - INR goal 2-3  Dispo: Initially planning for DC to CIR, however PT is now recommending home health. Likely DC tomorrow if patient remains stable. Face to face home health PT/ OT orders placed.   Velna Ochs,  MD 10/23/2017, 10:28 AM Pager: [phone]

## 2017-10-23 NOTE — Progress Notes (Signed)
Subjective: Interval History: no complaints, resting during HD. Off O2.   Objective: Vital signs in last 24 hours: Temp:  [98.1 F (36.7 C)-100 F (37.8 C)] 98.1 F (36.7 C) (02/02 0413) Pulse Rate:  [39-85] 85 (02/01 1242) Resp:  [19-31] 31 (02/01 1600) BP: (142-180)/(54-74) 144/68 (02/02 0413) SpO2:  [99 %] 99 % (02/01 1030) Weight:  [140 lb 3.4 oz (63.6 kg)] 140 lb 3.4 oz (63.6 kg) (02/02 0413) Weight change: -10.1 oz (-2.1 kg)  Intake/Output from previous day: 02/01 0701 - 02/02 0700 In: 712 [P.O.:240; NG/GT:472] Out: -  Intake/Output this shift: No intake/output data recorded.  General appearance: elderly female resting in bed inNAD.  Neck: no adenopathy, no carotid bruit, supple, symmetrical, trachea midline, thyroid not enlarged, symmetric, no tenderness/mass/nodules and neck veins up Resp: coarse BS throughout but good air movement Cardio: irregularly irregular rhythm  Ext no LE edema, 1+ BUEs    Lab Results: Recent Labs    10/22/17 0734 10/23/17 0834  WBC 7.1 6.9  HGB 12.0 11.6*  HCT 37.6 35.3*  PLT 91* 80*   BMET:  Recent Labs    10/22/17 0734 10/23/17 0834  NA 139 137  K 3.5 3.9  CL 98* 98*  CO2 27 25  GLUCOSE 85 80  BUN 15 35*  CREATININE 4.70* 6.52*  CALCIUM 7.9* 7.9*   No results for input(s): PTH in the last 72 hours. Iron Studies: No results for input(s): IRON, TIBC, TRANSFERRIN, FERRITIN in the last 72 hours.  Studies/Results: No results found.  Assessment/Plan:  Angela Trujillo is an 84 yo female with PMH of diabetes mellitus, hypertension, atrial fibrillation on chronic anticoagulation therapy and end-stage renal disease on hemodialysis on a TTS schedule who was brought to the emergency room with altered mental status along with missing scheduled HD 2/2 AMS. On presentation, K 6.5 with mild volume overload.   Altered mental status:Improved, likely flu A contributing. Tamiflu per primary.   Hyperkalemia:Resolved  Hypertension:  elevated BP. Per primary, consider restarting home med.   End-stage renal disease:last HD 10/21/17 able to tolerate full UF goal. Increase UFG 2/2 from home as BP tolerates.  -home HD schedule TTS  Atrial fibrillation: with controlled ventricular response-on warfarin  S/p TAVR  ACD hgb stable, hold ESA  Dispo: considering CIR   LOS: 3 days   [name] 10/23/2017,9:23 AM

## 2017-10-23 NOTE — Procedures (Signed)
[name] without hemodynamic issues. Flu symptoms improved. [name] [name]  

## 2017-10-24 LAB — RENAL FUNCTION PANEL
Albumin: 2.3 g/dL — ABNORMAL LOW (ref 3.5–5.0)
Anion gap: 13 (ref 5–15)
BUN: 22 mg/dL — AB (ref 6–20)
CALCIUM: 7.4 mg/dL — AB (ref 8.9–10.3)
CO2: 23 mmol/L (ref 22–32)
CREATININE: 4.84 mg/dL — AB (ref 0.44–1.00)
Chloride: 96 mmol/L — ABNORMAL LOW (ref 101–111)
GFR calc non Af Amer: 7 mL/min — ABNORMAL LOW (ref >60)
GFR, EST AFRICAN AMERICAN: 9 mL/min — AB (ref >60)
Glucose, Bld: 180 mg/dL — ABNORMAL HIGH (ref 65–99)
Phosphorus: 2.4 mg/dL — ABNORMAL LOW (ref 2.5–4.6)
Potassium: 4.1 mmol/L (ref 3.5–5.1)
SODIUM: 132 mmol/L — AB (ref 135–145)

## 2017-10-24 LAB — CBC
HEMATOCRIT: 35 % — AB (ref 36.0–46.0)
HEMOGLOBIN: 11.6 g/dL — AB (ref 12.0–15.0)
MCH: 29.7 pg (ref 26.0–34.0)
MCHC: 33.1 g/dL (ref 30.0–36.0)
MCV: 89.7 fL (ref 78.0–100.0)
Platelets: 73 10*3/uL — ABNORMAL LOW (ref 150–400)
RBC: 3.9 MIL/uL (ref 3.87–5.11)
RDW: 17.8 % — ABNORMAL HIGH (ref 11.5–15.5)
WBC: 7.9 10*3/uL (ref 4.0–10.5)

## 2017-10-24 LAB — GLUCOSE, CAPILLARY
GLUCOSE-CAPILLARY: 305 mg/dL — AB (ref 65–99)
Glucose-Capillary: 143 mg/dL — ABNORMAL HIGH (ref 65–99)

## 2017-10-24 LAB — PROTIME-INR
INR: 2.57
PROTHROMBIN TIME: 27.4 s — AB (ref 11.4–15.2)

## 2017-10-24 MED ORDER — AMIODARONE HCL 200 MG PO TABS: 200.0000 mg | ORAL_TABLET | Freq: Every day | ORAL | 0 refills | Status: DC

## 2017-10-24 MED ORDER — IPRATROPIUM-ALBUTEROL 0.5-2.5 (3) MG/3ML IN SOLN
3.0000 mL | Freq: Two times a day (BID) | RESPIRATORY_TRACT | Status: DC
  Administered 2017-10-24: 3 mL via RESPIRATORY_TRACT
  Filled 2017-10-24: qty 3

## 2017-10-24 MED ORDER — WARFARIN SODIUM 2.5 MG PO TABS
2.5000 mg | ORAL_TABLET | Freq: Once | ORAL | Status: DC
  Filled 2017-10-24: qty 1

## 2017-10-24 NOTE — Discharge Instructions (Signed)
Angela Trujillo, Angela Trujillo were admitted to the hospital with the flu and too much fluid.  Physical therapy is planning to come by your house to help you get some of your strength back.  Please follow up with your primary doctor in 1-2 weeks.

## 2017-10-24 NOTE — Progress Notes (Signed)
Subjective: Interval History: no complaints, off O2. She hopes to be discharged today.    Objective: Vital signs in last 24 hours: Temp:  [97.4 F (36.3 C)-99.2 F (37.3 C)] 98.5 F (36.9 C) (02/03 0340) Pulse Rate:  [68-86] 85 (02/03 0400) Resp:  [15-21] 19 (02/03 0400) BP: (112-172)/(55-80) 140/60 (02/03 0400) SpO2:  [96 %-100 %] 98 % (02/03 0400) FiO2 (%):  [21 %] 21 % (02/02 1406) Weight change: -1.6 oz (-0.5 kg)  Intake/Output from previous day: 02/02 0701 - 02/03 0700 In: 360 [P.O.:360] Out: -  Intake/Output this shift: No intake/output data recorded.  General appearance: elderly female resting in bed inNAD.  Neck: no adenopathy, no carotid bruit, supple, symmetrical, trachea midline, thyroid not enlarged, symmetric, no tenderness/mass/nodules  Resp: coarse BS throughout but good air movement Cardio: irregularly irregular rhythm  Ext no LE edema    Lab Results: Recent Labs    10/22/17 0734 10/23/17 0834  WBC 7.1 6.9  HGB 12.0 11.6*  HCT 37.6 35.3*  PLT 91* 80*   BMET:  Recent Labs    10/23/17 0834 10/24/17 0515  NA 137 132*  K 3.9 4.1  CL 98* 96*  CO2 25 23  GLUCOSE 80 180*  BUN 35* 22*  CREATININE 6.52* 4.84*  CALCIUM 7.9* 7.4*   No results for input(s): PTH in the last 72 hours. Iron Studies: No results for input(s): IRON, TIBC, TRANSFERRIN, FERRITIN in the last 72 hours.  Studies/Results: No results found.  Assessment/Plan:  Angela Trujillo is an 84 yo female with PMH of diabetes mellitus, hypertension, atrial fibrillation on chronic anticoagulation therapy and end-stage renal disease on hemodialysis on a TTS schedule who was brought to the emergency room with altered mental status along with missing scheduled HD 2/2 AMS. On presentation, K 6.5 with mild volume overload.   Altered mental status:Improved, likely flu A contributing. Tamiflu per primary.   Hyperkalemia:Resolved  Hypertension: elevated BP, marginally improved with extra UF  removal. Per primary, consider restarting home med.   End-stage renal disease:last HD 10/21/17 able to tolerate full UF goal. Increase UFG 2/2 from home as BP tolerates.  -continue home HD schedule TTS  Hyponatremia:  -fluid restriction to 2L with renal diet  Atrial fibrillation: with controlled ventricular response-on warfarin  S/p TAVR  ACD hgb stable, hold ESA   LOS: 4 days   Angela Trujillo 10/24/2017,7:23 AM    Renal Attending: I agree with plans.  She is being discharged today. Angela Trujillo

## 2017-10-24 NOTE — Progress Notes (Signed)
Patient being discharged home. Discharge instructions given. Patient/daughter educated on medication and making follow up appts. Patient/daughter verbalized understanding. Patient discharged home with daughter.

## 2017-10-24 NOTE — Progress Notes (Signed)
[facility] for Warfarin Indication: atrial fibrillation  severe aortic stenosis, s/p TAVR  Allergies  Allergen Reactions  . Asa [Aspirin] Nausea Only and Other (See Comments)    MAKES STOMACH HURT  . Codeine Rash  . Penicillins Rash     Has patient had a PCN reaction causing immediate rash, facial/tongue/throat swelling, SOB or lightheadedness with hypotension:  #  #  #  NO  #  #  #  Has patient had a PCN reaction causing severe rash involving mucus membranes or skin necrosis:  #  #  #  NO  #  #  # Has patient had a PCN reaction that required hospitalization:  #  #  #  NO  #  #  #  Has patient had a PCN reaction occurring within the last 10 years:  #  #  #  NO  #  #  #  If all answers are "NO", may proceed with Cephalosporin   Vital Signs: Temp: 99.1 F (37.3 C) (02/03 1233) Temp Source: Axillary (02/03 1233) BP: 134/61 (02/03 1233) Pulse Rate: 90 (02/03 0941)  Labs: Recent Labs    10/22/17 0734 10/23/17 0834 10/23/17 1519 10/24/17 0515 10/24/17 0842  HGB 12.0 11.6*  --   --  11.6*  HCT 37.6 35.3*  --   --  35.0*  PLT 91* 80*  --   --  73*  LABPROT 29.0*  --  25.5*  --  27.4*  INR 2.77  --  2.35  --  2.57  CREATININE 4.70* 6.52*  --  4.84*  --     Estimated Creatinine Clearance: 7.8 mL/min (A) (by C-G formula based on SCr of 4.84 mg/dL (H)).  Medications:  Warfarin PTA 2.5mg  daily except 5mg  Tu/Th/Sat (last dose was 5mg  on 1/29)  Assessment: 66 yoF presented to ED for AMS. Of note, pt had decreased PO intake for the past several days.   INR is 2.57  today.  CBC stable except for a drop in plt from 112>73.   HD patient (T/Th/Sat)  Goal of Therapy:  INR 2-3 Monitor platelets by anticoagulation protocol: Yes   Plan:  Warfarin 2.5 mg x 1 Daily INR, CBC Continue to watch PLT trend    Lashika Erker L. Kyung Rudd, PharmD, [place] PGY1 Pharmacy Resident Pager: [phone]

## 2017-10-24 NOTE — Progress Notes (Signed)
Subjective:  Angela Trujillo was seen lying in bed comfortably this morning. She was receiving a breathing treatment. She states her SOB is greatly improved. She denies CP, fevers/chills, or lower extremity swelling. She lives alone normally, but states that her daughter is here for a couple more weeks to help take care of her (daughter lives in [place]). She is amenable to discharge.  Objective:  Vital signs in last 24 hours: Vitals:   10/24/17 0340 10/24/17 0400 10/24/17 0747 10/24/17 0815  BP:  140/60 (!) 150/58   Pulse:  85    Resp:  19    Temp: 98.5 F (36.9 C)  97.8 F (36.6 C)   TempSrc: Oral  Oral   SpO2:  98%  100%  Weight:      Height:       GEN: Elderly female lying in bed with breathing treatment in NAD. Alert and oriented. HENT: [place]/AT. Moist mucous membranes. No visible lesions. RESP: Coarse breath sounds. No wheezes or rhonchi. CV: Normal rate and regular rhythm. No murmurs, gallops, or rubs. No LE edema. EXT: No LE edema. Warm and well perfused. NEURO: Cranial nerves II-XII grossly intact. Able to lift all four extremities against gravity. No apparent audiovisual hallucinations. Speech fluent and appropriate. PSYCH: Patient is calm and pleasant. Appropriate affect. Well-groomed; speech is appropriate and on-subject.  Assessment/Plan:  Ms. Condie is an 84yo female with PMH of ESRD on HD TTS, AS s/p TAVR 09/29/2016, atrial fibrillation (on warfarin), DM, and HTN who presents with 3 days of worsening encephalopathy and new oxygen requirement, found to be positive for Influenza A.  Influenza A Significantly improved clinically. Encephalopathy has resolved, and she is no longer requiring oxygen. PT recommending HH PT. Daughter has stated she will be able to help take care of the patient while at home. - Continue oseltamivir, renal dosing per pharmacy - Duonebs BID - DC to home with [facility] PT  ESRD on HD TTS Hyperkalemia, resolved Received HD 2/2 on normal TuThSa  schedule. Volume status improved on exam today. - Nephrology consulted; appreciate their assistance - Continue home Phoslo 667mg  qday - Continue home ferrous sulfate 325mg  qday  Encephalopathy, resolved  Likely metabolic in the setting of influenza A infection. Alert and oriented x3 this morning after receiving supportive therapy for flu and HD. - Continue to monitor - Continue ESRD and influenza management as above  Atrial fibrillation, rate controlled Currently on warfarin for paroxysmal atrial fibrillation andsevere aortic stenosiss/p TAVR. INR goal is 2-3. INR at goal, 2.57 today. 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 No change in BM. Has dark stools at baseline since starting iron therapy. Hx of H pylori and diverticular GIB treated in 06/2017. Hb stable at 11.6. Plt 112 -> 91 -> 80 - Continue ferrous sulfate 325 mg qday - F/u CBC - Warfarin per pharmacy  HTN BP 180/69, HR 87 . Home regimen includes clonidine 0.1mg  daily, amiodarone 200mg  QHS, and metoprolol XL 100mg  daily. - Continue home metoprolol XL 100mg  daily - Continue home amiodarone 200mg  QHS - Continue home clonidine 0.1mg  daily  Insulin-dependent T2DM A1c 8.5 09/2016. On Toujeo 10 units QHS at home. CBG elevated to 224 this AM. Dypshagia 2 diet started. - SSI-S - CBG monitoring  HFpEF (EF 55% and G2DD on TTE 10/2016) Severe aortic stenosis, s/p TAVR 1/2018with  INR goal 2-3 BNP >4,500on admission, no prior to compare.S/pemergent HD 1/30 and another HD 1/31. Volume status improved since admission. - Discontinue aspirin 81mg  daily per Cardiology note 05/2017 and discharge summary 06/2017. - Continue warfarin per pharmacy - INR goal 2-3  Dispo: Discharge home with [facility] PT today  Colbert Ewing, MD 10/24/2017, 9:06 AM Pager:  [phone]

## 2017-10-24 NOTE — Care Management Note (Signed)
Case Management Note  Patient Details  Name: Angela Trujillo MRN: [id number] Date of Birth: Sep 21, 1931  Subjective/Objective:                 Spoke with patient. She would like to resume [facility] services with [facility]. Refrral Accepted by Blanchard Mane, [phone]. No other CM needs identified.    Action/Plan:   Expected Discharge Date:  10/24/17               Expected Discharge Plan:  [place]  In-House Referral:     Discharge planning Services  CM Consult  Post Acute Care Choice:    Choice offered to:  Patient  DME Arranged:    DME Agency:     HH Arranged:  PT, OT HH Agency:  [facility]  Status of Service:  Completed, signed off  If discussed at [place] of Stay Meetings, dates discussed:    Additional Comments:  Carles Collet, RN 10/24/2017, 1:51 PM

## 2017-10-25 LAB — CULTURE, BLOOD (ROUTINE X 2)
Culture: NO GROWTH
Culture: NO GROWTH
SPECIAL REQUESTS: ADEQUATE

## 2017-10-29 ENCOUNTER — Ambulatory Visit (INDEPENDENT_AMBULATORY_CARE_PROVIDER_SITE_OTHER): Payer: Medicare Other | Admitting: Internal Medicine

## 2017-10-29 DIAGNOSIS — Z5181 Encounter for therapeutic drug level monitoring: Secondary | ICD-10-CM

## 2017-10-29 DIAGNOSIS — I482 Chronic atrial fibrillation, unspecified: Secondary | ICD-10-CM

## 2017-10-29 LAB — POCT INR: INR: 2.2

## 2017-10-29 NOTE — Patient Instructions (Signed)
Description   Spoke with Leafy Ro, RN with [facility] and advised pt to continue 1 tablet daily except 1/2 tablet on Mondays, Wednesdays and Fridays.  Recheck INR in 2 weeks.

## 2017-11-05 ENCOUNTER — Ambulatory Visit (HOSPITAL_COMMUNITY): Payer: Medicare Other | Attending: Cardiology

## 2017-11-05 ENCOUNTER — Ambulatory Visit (INDEPENDENT_AMBULATORY_CARE_PROVIDER_SITE_OTHER): Payer: Medicare Other | Admitting: Cardiovascular Disease

## 2017-11-05 ENCOUNTER — Encounter: Payer: Self-pay | Admitting: Cardiovascular Disease

## 2017-11-05 ENCOUNTER — Other Ambulatory Visit: Payer: Self-pay

## 2017-11-05 VITALS — BP 150/62 | HR 62

## 2017-11-05 DIAGNOSIS — I359 Nonrheumatic aortic valve disorder, unspecified: Secondary | ICD-10-CM | POA: Diagnosis not present

## 2017-11-05 DIAGNOSIS — Z952 Presence of prosthetic heart valve: Secondary | ICD-10-CM | POA: Diagnosis not present

## 2017-11-05 DIAGNOSIS — E1122 Type 2 diabetes mellitus with diabetic chronic kidney disease: Secondary | ICD-10-CM | POA: Diagnosis not present

## 2017-11-05 DIAGNOSIS — I4891 Unspecified atrial fibrillation: Secondary | ICD-10-CM | POA: Insufficient documentation

## 2017-11-05 DIAGNOSIS — I480 Paroxysmal atrial fibrillation: Secondary | ICD-10-CM | POA: Diagnosis not present

## 2017-11-05 DIAGNOSIS — I253 Aneurysm of heart: Secondary | ICD-10-CM | POA: Diagnosis not present

## 2017-11-05 DIAGNOSIS — I4892 Unspecified atrial flutter: Secondary | ICD-10-CM | POA: Diagnosis not present

## 2017-11-05 DIAGNOSIS — I272 Pulmonary hypertension, unspecified: Secondary | ICD-10-CM | POA: Insufficient documentation

## 2017-11-05 DIAGNOSIS — I1311 Hypertensive heart and chronic kidney disease without heart failure, with stage 5 chronic kidney disease, or end stage renal disease: Secondary | ICD-10-CM | POA: Insufficient documentation

## 2017-11-05 DIAGNOSIS — I350 Nonrheumatic aortic (valve) stenosis: Secondary | ICD-10-CM | POA: Diagnosis not present

## 2017-11-05 DIAGNOSIS — N186 End stage renal disease: Secondary | ICD-10-CM | POA: Insufficient documentation

## 2017-11-05 DIAGNOSIS — Z953 Presence of xenogenic heart valve: Secondary | ICD-10-CM | POA: Insufficient documentation

## 2017-11-05 DIAGNOSIS — I083 Combined rheumatic disorders of mitral, aortic and tricuspid valves: Secondary | ICD-10-CM | POA: Insufficient documentation

## 2017-11-05 NOTE — Patient Instructions (Signed)
Medication Instructions:  Your provider recommends that you continue on your current medications as directed. Please refer to the Current Medication list given to you today.    Labwork: TODAY: TSH, Free T4, Lfts  Testing/Procedures: None  Follow-Up: Your provider wants you to follow-up in: 6 months with Dr. Burt Knack. You will receive a reminder letter in the mail two months in advance. If you don't receive a letter, please call our office to schedule the follow-up appointment.    Any Other Special Instructions Will Be Listed Below (If Applicable).     If you need a refill on your cardiac medications before your next appointment, please call your pharmacy.

## 2017-11-05 NOTE — Progress Notes (Signed)
Cardiology Office Note Date:  11/05/2017   ID:  Angela Trujillo, DOB 12/30/1930, MRN [id number]  PCP:  Glendon Axe, MD  Cardiologist:  Sherren Mocha, MD    Chief Complaint  Patient presents with  . Shortness of Breath     History of Present Illness: Angela Trujillo is a 84 y.o. female who presents for follow-up of aortic valve disease and paroxysmal atrial fibrillation.  She underwent TAVR 1 year ago and is here today for her 12-monthM and follow-up visit.  She is also followed for PAF and treated with a rhythm control strategy using amiodarone.  She coagulated with warfarin without any recent bleeding problems.  The patient is here with her son today.  She reports stable symptoms of exertional dyspnea.  She is comfortable at rest and denies orthopnea or PND.  She has had no recent chest pain, lightheadedness, or syncope.  She was hospitalized about 2 weeks ago with altered mental status and was found to have influenza A.  She did not have any cardiac problems during that hospitalization and those records are all reviewed today.  Her son reports that she is improving at home.   Past Medical History:  Diagnosis Date  . Atrial fibrillation with RVR ([place] 10/22/2016  . Atrial flutter ([place]   . Diabetes mellitus with end stage renal disease ([place]   . Dyspnea    with exertion  . ESRD (end stage renal disease) on dialysis ([place]    HD on T,T, Sa  . GERD (gastroesophageal reflux disease)   . Hypertension   . Pneumonia 10/2015  . Severe aortic stenosis 08/19/2016   s/p TAVR 09/2016 // b. Echo 09/30/16: EF 50-55, inf-septal AK, Gr 2 DD, normally functioning TAVR, mean AV gradient 15 mmHg, mod MAC, mild MS, mild MR, trivial effusion    Past Surgical History:  Procedure Laterality Date  . ABDOMINAL HYSTERECTOMY    . CARDIAC CATHETERIZATION N/A 08/19/2016   Procedure: Right/Left Heart Cath and Coronary Angiography;  Surgeon: MSherren Mocha MD;  [facility];  Service:  Cardiovascular;  Laterality: N/A;  . COLONOSCOPY    . COLONOSCOPY N/A 06/25/2017   Procedure: COLONOSCOPY;  Surgeon: SLadene Artist MD;  [facility];  Service: Endoscopy;  Laterality: N/A;  . ESOPHAGOGASTRODUODENOSCOPY N/A 06/24/2017   Procedure: ESOPHAGOGASTRODUODENOSCOPY (EGD);  Surgeon: SLadene Artist MD;  [facility];  Service: Endoscopy;  Laterality: N/A;  . IR GENERIC HISTORICAL  08/31/2016   IR RADIOLOGY PERIPHERAL GUIDED IV START 08/31/2016 JCorrie Mckusick DO [facility]INTERV RAD  . IR GENERIC HISTORICAL  08/31/2016   IR [place] ACC[place] RIGHT 08/31/2016 JCorrie Mckusick DO [facility]  . MULTIPLE EXTRACTIONS WITH ALVEOLOPLASTY N/A 09/25/2016   Procedure: EXTRACTION of tooth number 31 WITH ALVEOLOPLASTY AND GROSS DEBRIDEMENT OF REMAINING TEETH;  Surgeon: RLenn Cal DDS;  [place] [facility]  Service: Oral Surgery;  Laterality: N/A;  . TEE WITHOUT CARDIOVERSION N/A 09/29/2016   Procedure: TRANSESOPHAGEAL ECHOCARDIOGRAM (TEE);  Surgeon: MSherren Mocha MD;  [place]  Service: Open Heart Surgery;  Laterality: N/A;  . TRANSCATHETER AORTIC VALVE REPLACEMENT, TRANSFEMORAL N/A 09/29/2016   Procedure: TRANSCATHETER AORTIC VALVE REPLACEMENT, TRANSFEMORAL;  Surgeon: MSherren Mocha MD;  [facility]  Service: Open Heart Surgery;  Laterality: N/A;  . TUBAL LIGATION      Current Outpatient Medications  Medication Sig Dispense Refill  . amiodarone (PACERONE) 200 MG tablet Take 1 tablet (200 mg total) by mouth at bedtime. 30 tablet 0  .  calcium acetate (PHOSLO) 667 MG capsule Take 667 mg by mouth daily with breakfast.    . cloNIDine (CATAPRES) 0.1 MG tablet TAKE 1 TABLET BY MOUTH TWICE DAILY (Patient taking differently: TAKE 0.1 mg TABLET BY MOUTH evening) 60 tablet 8  . ferrous sulfate 325 (65 FE) MG tablet Take 325 mg by mouth 2 (two) times daily with a meal.     . Insulin Glargine (TOUJEO SOLOSTAR [place]) Inject 10 Units into the skin at bedtime.    . metoprolol  succinate (TOPROL-XL) 100 MG 24 hr tablet Take 100 mg by mouth daily.    . multivitamin (RENA-VIT) TABS tablet Take 1 tablet by mouth at bedtime.  0  . ondansetron (ZOFRAN) 4 MG tablet Take 1 tablet (4 mg total) by mouth 2 (two) times daily. Take if needed for nausea or vomiting 15 tablet 0  . SENSIPAR 30 MG tablet Take 30 mg by mouth Every Tuesday,Thursday,and Saturday with dialysis.     Marland Kitchen warfarin (COUMADIN) 5 MG tablet Take 1 tablet (5 mg total) by mouth daily. Resume usual dosing October 12. (Patient taking differently: Take 5 mg by mouth See admin instructions. 5 mg on Tues, thurs and Saturday Take 2.5 mg on Sun. Mon. Wed. Friday)     No current facility-administered medications for this visit.     Allergies:   Asa [aspirin]; Codeine; and Penicillins   Social History:  The patient  reports that  has never smoked. she has never used smokeless tobacco. She reports that she does not drink alcohol or use drugs.   Family History:  The patient's family history is not on file.    ROS:  Please see the history of present illness.  All other systems are reviewed and negative.    PHYSICAL EXAM: VS:  BP (!) 150/62   Pulse 62  , BMI There is no height or weight on file to calculate BMI. GEN: Well nourished, well developed, pleasant elderly woman in no acute distress. She is in a wheelchair.  HEENT: normal  Neck: no JVD, no masses. No carotid bruits Cardiac: RRR with a 2/6 systolic murmur at the RUSB, widely split S2               Respiratory:  clear to auscultation bilaterally, normal work of breathing GI: soft, nontender, nondistended, + BS MS: no deformity or atrophy  Ext: 1+ bilateral pretibial edema Skin: warm and dry, no rash Neuro:  Strength and sensation are intact Psych: euthymic mood, full affect  EKG:  EKG is not ordered today.  Recent Labs: 02/17/2017: Magnesium 2.1 04/23/2017: TSH 1.330 10/20/2017: ALT 45; B Natriuretic Peptide >4,500.0 10/24/2017: BUN 22; Creatinine, Ser 4.84;  Hemoglobin 11.6; Platelets 73; Potassium 4.1; Sodium 132   Lipid Panel     Component Value Date/Time   CHOL 181 04/15/2013 1120   TRIG 91 04/15/2013 1120   HDL 62 04/15/2013 1120   CHOLHDL 2.9 04/15/2013 1120   VLDL 18 04/15/2013 1120   LDLCALC 101 (H) 04/15/2013 1120      Wt Readings from Last 3 Encounters:  10/23/17 139 lb 1.8 oz (63.1 kg)  07/07/17 146 lb 9.6 oz (66.5 kg)  06/26/17 139 lb 15.9 oz (63.5 kg)     Cardiac Studies Reviewed: The patient's echocardiogram from today is personally reviewed.  The formal interpretation is currently pending.  Echo images demonstrate normal left ventricular systolic function.  There is moderate tricuspid regurgitation present.  The mean transaortic valve gradient is 14 mmHg.  I  do not appreciate any paravalvular regurgitation.  There is no significant mitral regurgitation.  ASSESSMENT AND PLAN: 1.  Aortic valve disease status post TAVR: The patient appears to have a normally functioning aortic valve prosthesis.  She is anticoagulated with warfarin in the setting of paroxysmal atrial fibrillation.  I will see her back in 6 months.  2.  Paroxysmal atrial fibrillation: Maintained in sinus rhythm on amiodarone tolerating warfarin without bleeding problems.  We will check  LFTs and thyroid studies today.  Her transaminases were elevated during her recent hospitalization but this may have been related to her acute infection.  3.  End-stage renal disease: Appears to be tolerating hemodialysis  4.  Hypertension: Treated with clonidine and metoprolol succinate  Current medicines are reviewed with the patient today.  The patient does not have concerns regarding medicines.  Labs/ tests ordered today include:   Orders Placed This Encounter  Procedures  . TSH  . T4, free  . Hepatic function panel    Disposition:   FU 6 months  Signed, Sherren Mocha, MD  11/05/2017 5:18 PM    [facility] [address], [place],  [place]  [address] Phone: [phone]; Fax: [phone]

## 2017-11-06 LAB — HEPATIC FUNCTION PANEL
ALBUMIN: 3.2 g/dL — AB (ref 3.5–4.7)
ALT: 22 IU/L (ref 0–32)
AST: 24 IU/L (ref 0–40)
Alkaline Phosphatase: 119 IU/L — ABNORMAL HIGH (ref 39–117)
Bilirubin Total: 0.4 mg/dL (ref 0.0–1.2)
Bilirubin, Direct: 0.19 mg/dL (ref 0.00–0.40)
TOTAL PROTEIN: 6.4 g/dL (ref 6.0–8.5)

## 2017-11-06 LAB — T4, FREE: FREE T4: 1.57 ng/dL (ref 0.82–1.77)

## 2017-11-06 LAB — TSH: TSH: 1.35 u[IU]/mL (ref 0.450–4.500)

## 2017-11-10 ENCOUNTER — Ambulatory Visit (INDEPENDENT_AMBULATORY_CARE_PROVIDER_SITE_OTHER): Payer: Medicare Other | Admitting: Cardiovascular Disease

## 2017-11-10 DIAGNOSIS — Z5181 Encounter for therapeutic drug level monitoring: Secondary | ICD-10-CM

## 2017-11-10 DIAGNOSIS — I482 Chronic atrial fibrillation, unspecified: Secondary | ICD-10-CM

## 2017-11-10 LAB — POCT INR: INR: 2

## 2017-11-24 ENCOUNTER — Ambulatory Visit (INDEPENDENT_AMBULATORY_CARE_PROVIDER_SITE_OTHER): Payer: Medicare Other | Admitting: Cardiovascular Disease

## 2017-11-24 DIAGNOSIS — I482 Chronic atrial fibrillation, unspecified: Secondary | ICD-10-CM

## 2017-11-24 DIAGNOSIS — Z952 Presence of prosthetic heart valve: Secondary | ICD-10-CM | POA: Diagnosis not present

## 2017-11-24 DIAGNOSIS — I480 Paroxysmal atrial fibrillation: Secondary | ICD-10-CM

## 2017-11-24 DIAGNOSIS — Z5181 Encounter for therapeutic drug level monitoring: Secondary | ICD-10-CM | POA: Diagnosis not present

## 2017-11-24 LAB — POCT INR: INR: 1.8

## 2017-11-24 NOTE — Patient Instructions (Signed)
Description   Spoke with  pt's son Angela Trujillo and with [facility] nurse with [facility] [place] and advised to have pt take 1 tablet (5mg ) today March 6th then  continue 1 tablet  (5mg ) daily except 1/2 tablet  (2.5mg ) on Mondays, Wednesdays and Fridays.  Recheck INR in 2 weeks.

## 2017-12-07 ENCOUNTER — Other Ambulatory Visit: Payer: Self-pay | Admitting: *Deleted

## 2017-12-07 ENCOUNTER — Other Ambulatory Visit: Payer: Self-pay | Admitting: Cardiovascular Disease

## 2017-12-08 ENCOUNTER — Ambulatory Visit (INDEPENDENT_AMBULATORY_CARE_PROVIDER_SITE_OTHER): Payer: Medicare Other | Admitting: Internal Medicine

## 2017-12-08 DIAGNOSIS — I480 Paroxysmal atrial fibrillation: Secondary | ICD-10-CM | POA: Diagnosis not present

## 2017-12-08 DIAGNOSIS — I482 Chronic atrial fibrillation, unspecified: Secondary | ICD-10-CM

## 2017-12-08 DIAGNOSIS — Z5181 Encounter for therapeutic drug level monitoring: Secondary | ICD-10-CM | POA: Diagnosis not present

## 2017-12-08 DIAGNOSIS — Z952 Presence of prosthetic heart valve: Secondary | ICD-10-CM

## 2017-12-08 LAB — POCT INR: INR: 1.6

## 2017-12-08 NOTE — Patient Instructions (Signed)
Description   Spoke with  [facility] nurse with [facility] and advised to have pt take 1 tablet (5mg ) today March 20th then  Change dose of coumadin to  1 tablet  (5mg ) daily except 1/2 tablet  (2.5mg ) only on   Wednesdays and Fridays.  Recheck INR in 2 weeks. Will be discharged from [place] next week so appt made to be seen in coumadin clinic

## 2017-12-09 ENCOUNTER — Encounter: Payer: Self-pay | Admitting: Thoracic Surgery (Cardiothoracic Vascular Surgery)

## 2017-12-22 ENCOUNTER — Ambulatory Visit (INDEPENDENT_AMBULATORY_CARE_PROVIDER_SITE_OTHER): Payer: Medicare Other | Admitting: Cardiology

## 2017-12-22 DIAGNOSIS — Z5181 Encounter for therapeutic drug level monitoring: Secondary | ICD-10-CM | POA: Diagnosis not present

## 2017-12-22 DIAGNOSIS — I482 Chronic atrial fibrillation, unspecified: Secondary | ICD-10-CM

## 2017-12-22 LAB — POCT INR: INR: 3.1

## 2018-01-04 ENCOUNTER — Ambulatory Visit (INDEPENDENT_AMBULATORY_CARE_PROVIDER_SITE_OTHER): Payer: Medicare Other

## 2018-01-04 DIAGNOSIS — I482 Chronic atrial fibrillation, unspecified: Secondary | ICD-10-CM

## 2018-01-04 DIAGNOSIS — Z5181 Encounter for therapeutic drug level monitoring: Secondary | ICD-10-CM | POA: Diagnosis not present

## 2018-01-04 LAB — POCT INR: INR: 1.7

## 2018-01-04 NOTE — Patient Instructions (Signed)
Description   Spoke with [facility] [place] while in pt's home and advised to have pt take 1.5 tablets (7.5mg ) today,  then resume same dosage 1 tablet  (5mg ) daily except 1/2 tablet  (2.5mg ) on Wednesdays and Fridays.  Recheck INR in 10 days.

## 2018-01-13 LAB — POCT INR: INR: 1.8

## 2018-01-16 IMAGING — CR DG CHEST 2V
2 series · 2 of 2 positions shown · non-contrast
Comparison: 12/13/2015

CLINICAL DATA: Previous TAVR.  Dental surgery.

EXAM:
CHEST  2 VIEW

[w chest pa]
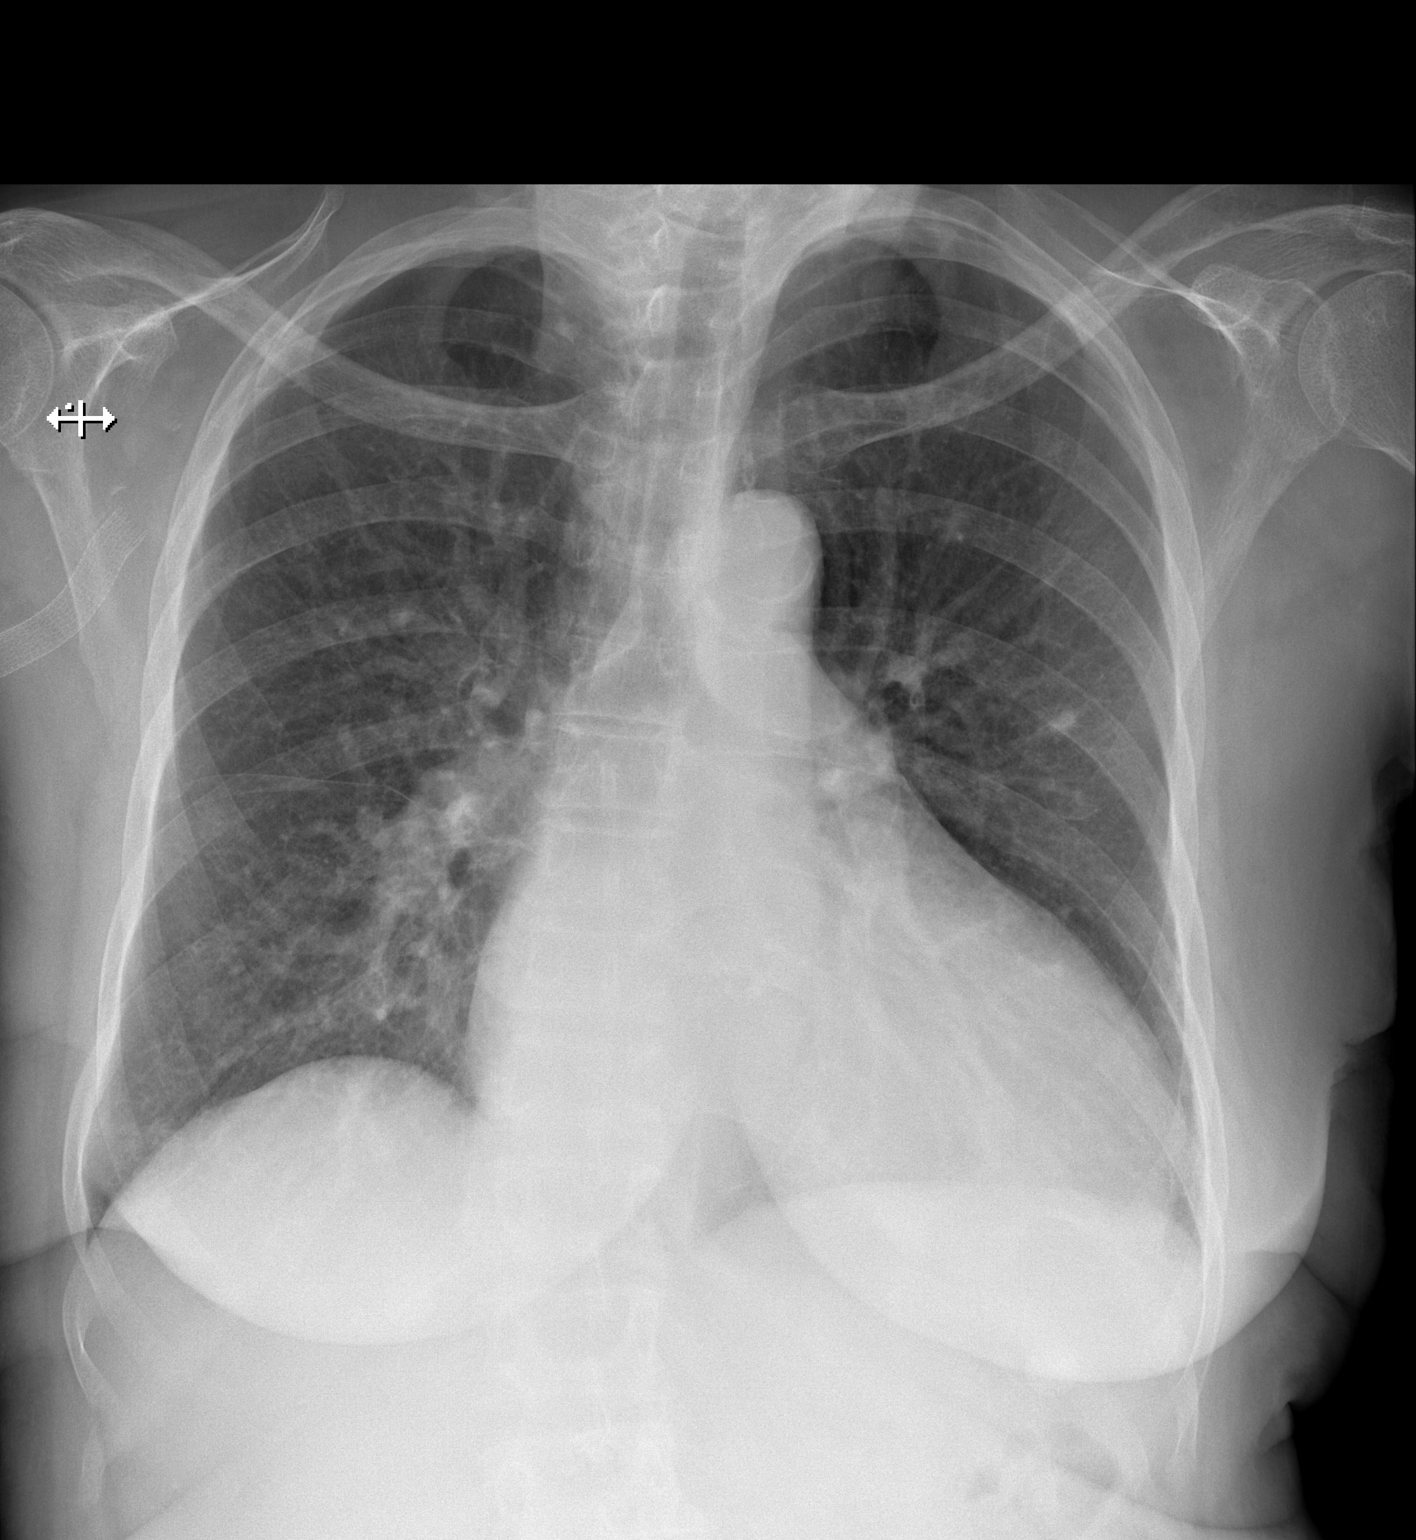

[w chest lat]
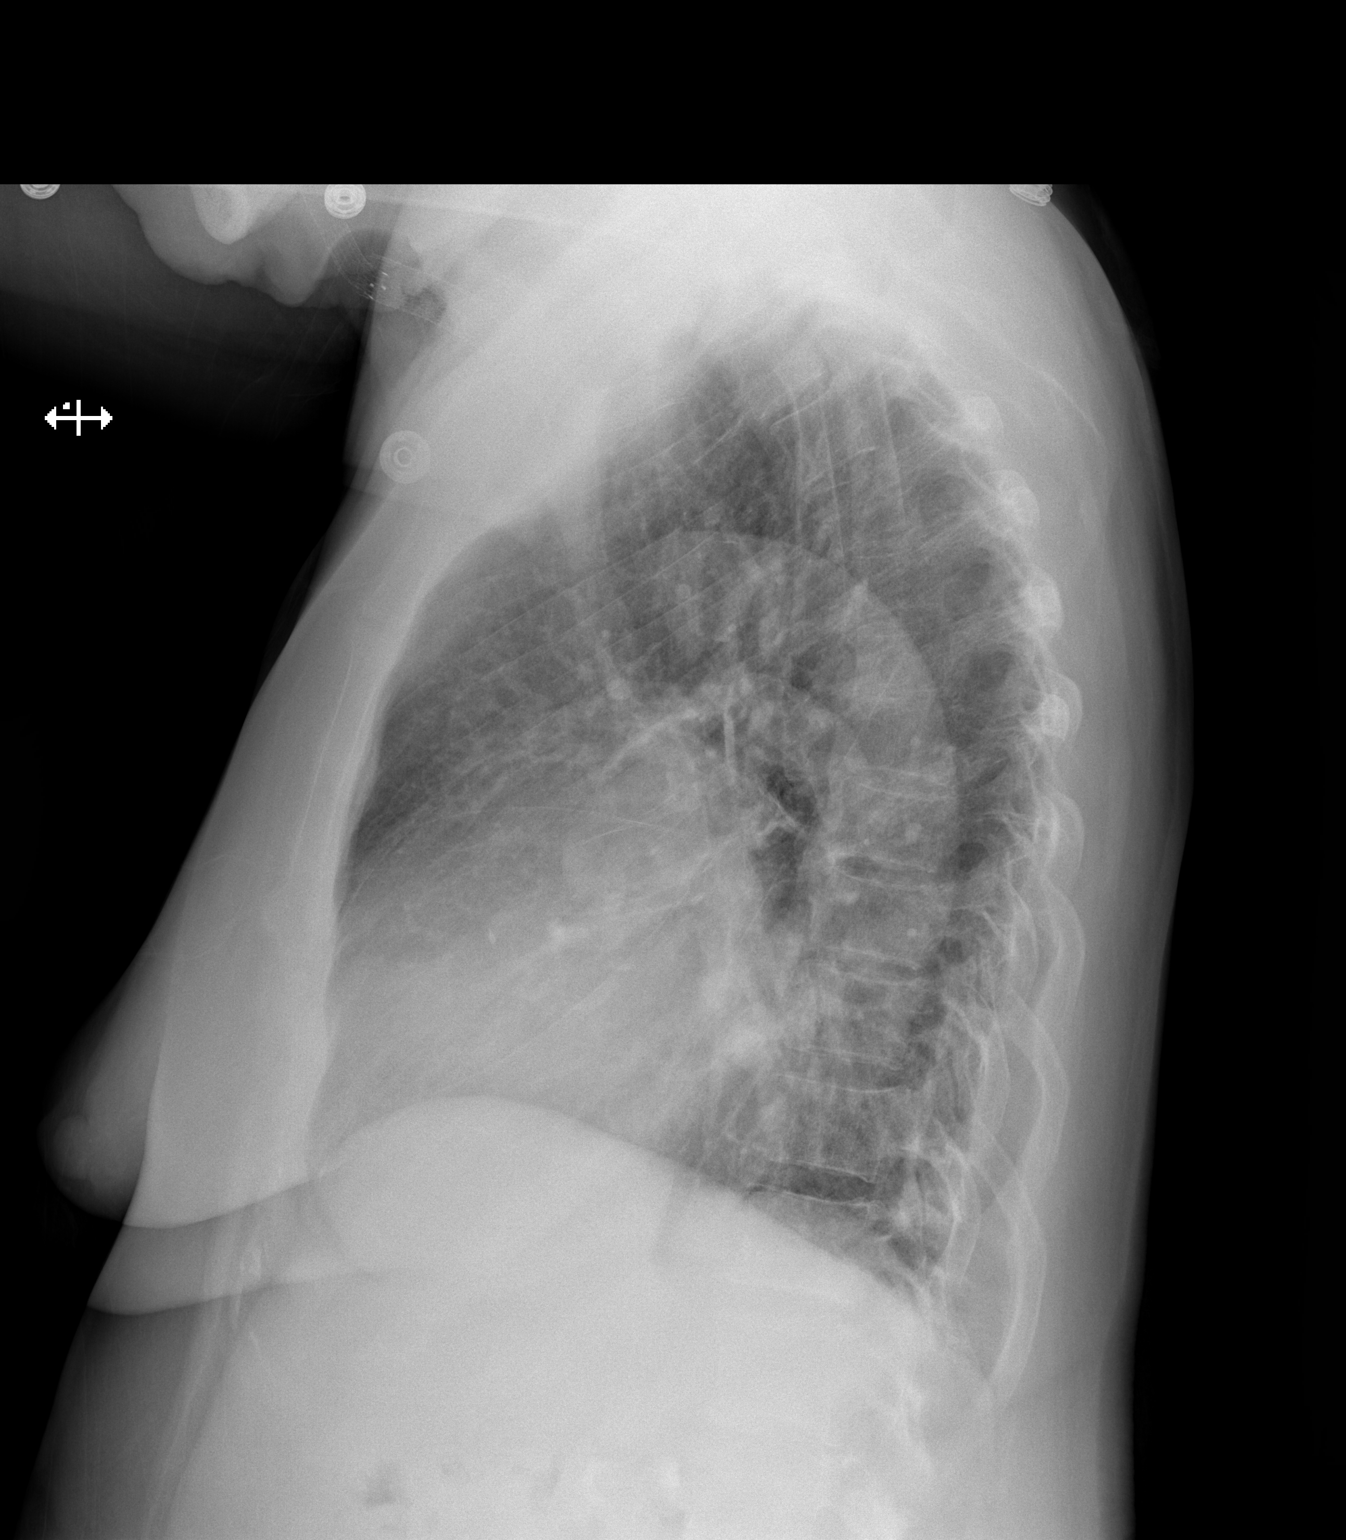

[2 of 2 positions shown; findings below may reference images not displayed]

FINDINGS: Chronic cardiomegaly. Chronic aortic atherosclerotic calcification.
Pulmonary vascularity is normal. Lungs are clear. No effusions. No
acute bone finding.
IMPRESSION: Cardiomegaly.  Aortic atherosclerosis.  No active disease.

## 2018-01-17 ENCOUNTER — Ambulatory Visit (INDEPENDENT_AMBULATORY_CARE_PROVIDER_SITE_OTHER): Payer: Medicare Other | Admitting: Internal Medicine

## 2018-01-17 DIAGNOSIS — Z952 Presence of prosthetic heart valve: Secondary | ICD-10-CM | POA: Diagnosis not present

## 2018-01-17 DIAGNOSIS — I480 Paroxysmal atrial fibrillation: Secondary | ICD-10-CM | POA: Diagnosis not present

## 2018-01-17 DIAGNOSIS — I482 Chronic atrial fibrillation, unspecified: Secondary | ICD-10-CM

## 2018-01-17 DIAGNOSIS — Z5181 Encounter for therapeutic drug level monitoring: Secondary | ICD-10-CM

## 2018-01-17 NOTE — Patient Instructions (Signed)
Description   Spoke with pt's son Abagail Kitchens and instructed to have pt take1 and 1/2 tablet (7.5mg ) today April 29th then change coumadin dose to  1 tablet  (5mg ) daily except 1/2 tablet  (2.5mg ) only on  Wednesdays   Recheck INR May 9th  Spoke with Varney Biles nurse with [facility] phone [phone] and gave above orders to her and she states understanding

## 2018-01-20 IMAGING — CR DG CHEST 1V PORT
1 series · 1 of 1 positions shown · non-contrast
Comparison: PA and lateral chest x-ray September 25, 2016

CLINICAL DATA: Severe aortic stenosis status post transcatheter
aortic valve replacement.

EXAM:
PORTABLE CHEST 1 VIEW

[AP]
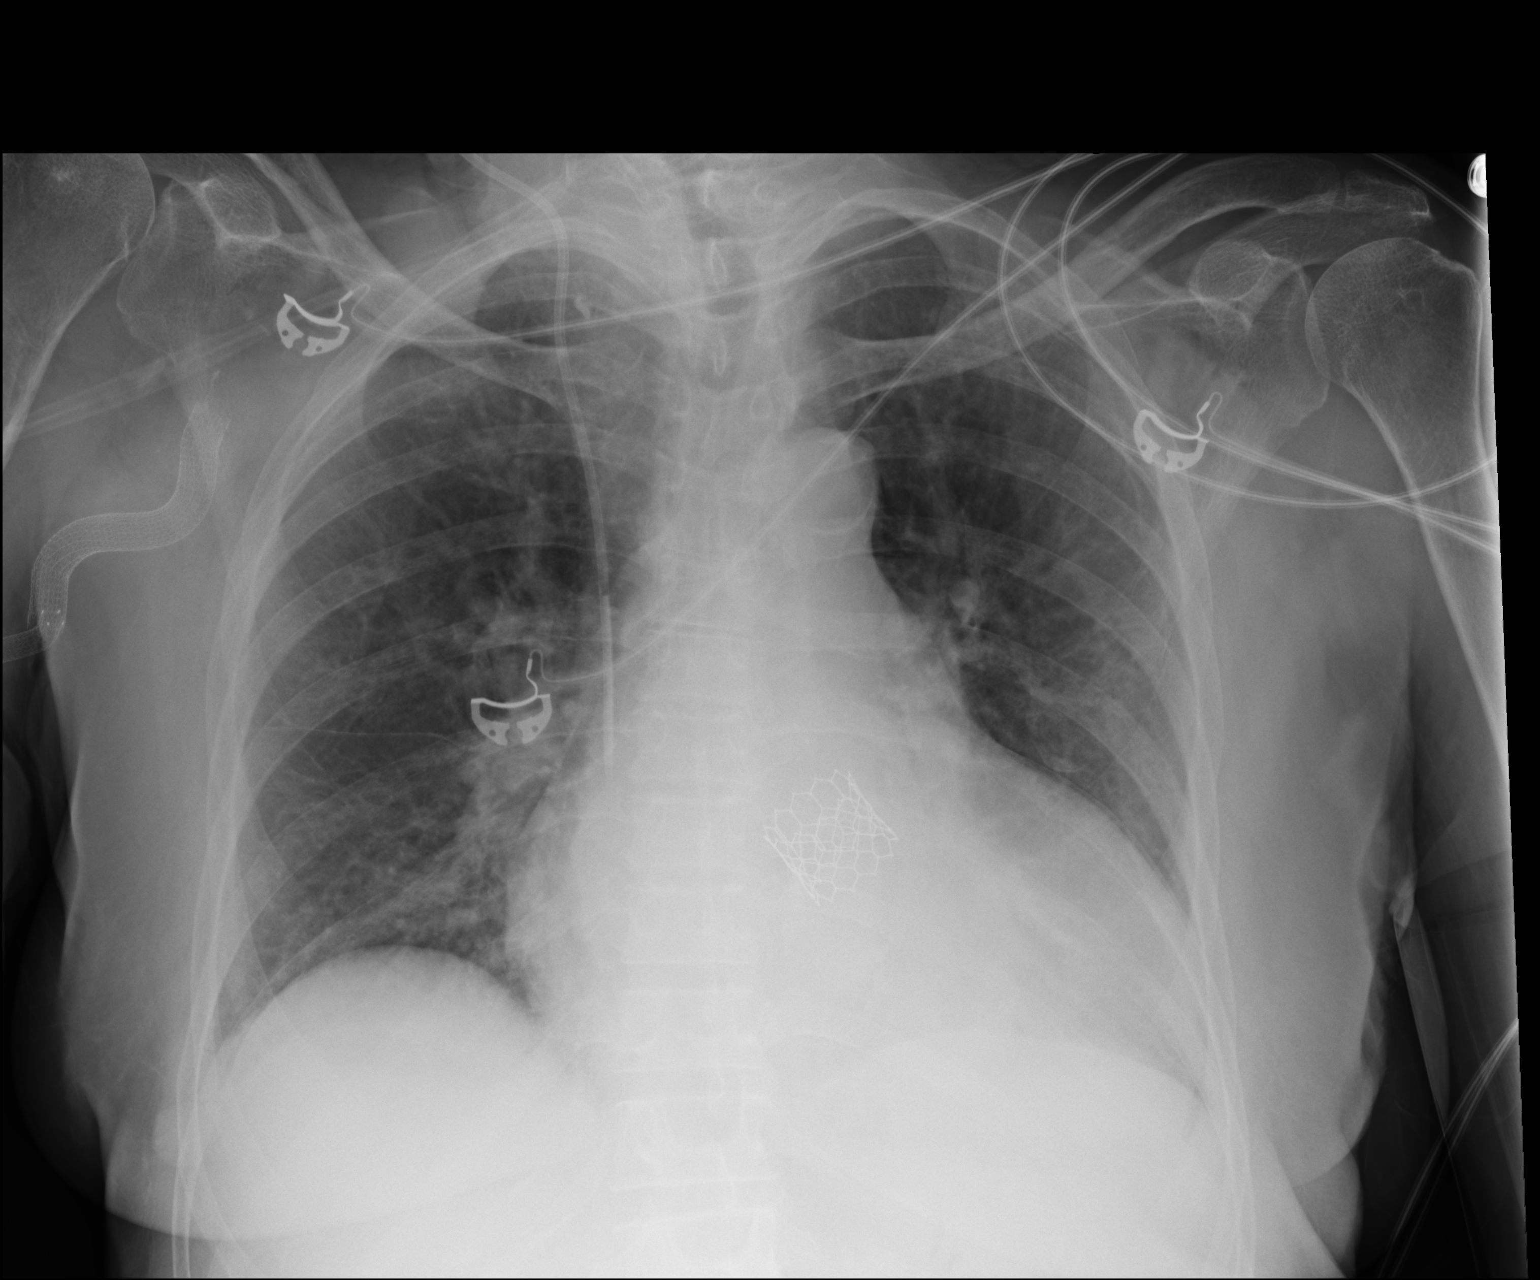

[1 of 1 positions shown; findings below may reference images not displayed]

FINDINGS: There has been interval placement of a prosthetic aortic valve cage
Assembly. The cardiac silhouette remains enlarged. The pulmonary
vascularity is mildly prominent centrally. There is calcification in
the wall of the aortic arch. There is no pleural effusion or
pneumothorax. The right internal jugular venous catheter tip
projects over the lower third of the SVC.
IMPRESSION: No postprocedure complication observed following transcatheter
aortic valve replacement. Mild stable cardiomegaly with minimal
pulmonary vascular congestion.

Thoracic aortic atherosclerosis.

## 2018-01-23 IMAGING — CR DG CHEST 1V PORT
1 series · 1 of 1 positions shown · non-contrast
Comparison: 09/30/2016

CLINICAL DATA: Status post transcatheter aortic valve replacement.
Altered mental status. Code stroke.

EXAM:
PORTABLE CHEST 1 VIEW

[AP]
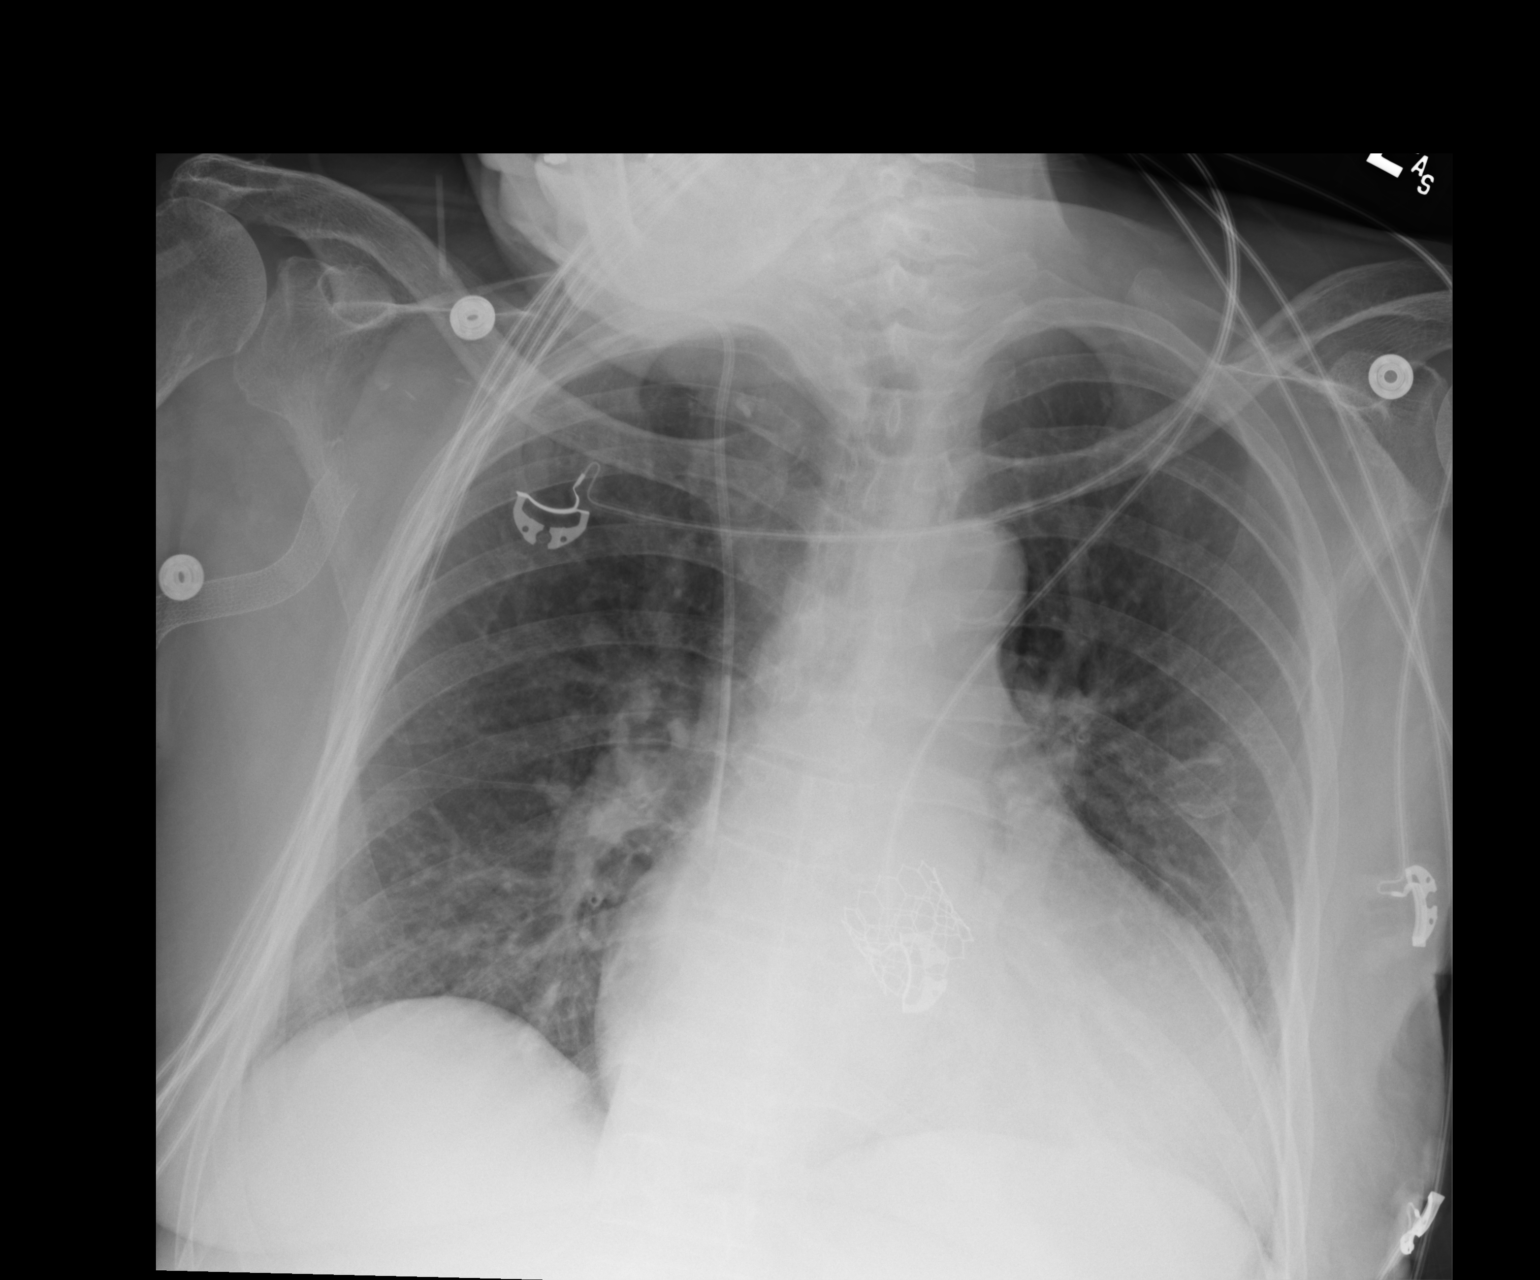

[1 of 1 positions shown; findings below may reference images not displayed]

FINDINGS: Postoperative aortic valve prosthesis. Cardiac enlargement without
vascular congestion. No focal airspace disease or consolidation in
the lungs. No blunting of costophrenic angles. No pneumothorax.
Right central venous catheter with tip over the cavoatrial junction
region. Calcification of the aorta. Vascular stent in the right
axilla.
IMPRESSION: Cardiac enlargement.  No evidence of active pulmonary disease.

## 2018-01-27 ENCOUNTER — Ambulatory Visit (INDEPENDENT_AMBULATORY_CARE_PROVIDER_SITE_OTHER): Payer: Medicare Other | Admitting: Cardiology

## 2018-01-27 DIAGNOSIS — Z5181 Encounter for therapeutic drug level monitoring: Secondary | ICD-10-CM

## 2018-01-27 DIAGNOSIS — I482 Chronic atrial fibrillation, unspecified: Secondary | ICD-10-CM

## 2018-01-27 LAB — POCT INR: INR: 1.4

## 2018-02-04 ENCOUNTER — Ambulatory Visit (INDEPENDENT_AMBULATORY_CARE_PROVIDER_SITE_OTHER): Payer: Medicare Other | Admitting: Cardiology

## 2018-02-04 DIAGNOSIS — I482 Chronic atrial fibrillation, unspecified: Secondary | ICD-10-CM

## 2018-02-04 DIAGNOSIS — Z5181 Encounter for therapeutic drug level monitoring: Secondary | ICD-10-CM | POA: Diagnosis not present

## 2018-02-04 LAB — POCT INR: INR: 2.9

## 2018-02-10 ENCOUNTER — Ambulatory Visit (INDEPENDENT_AMBULATORY_CARE_PROVIDER_SITE_OTHER): Payer: Medicare Other | Admitting: Pharmacist

## 2018-02-10 DIAGNOSIS — I482 Chronic atrial fibrillation, unspecified: Secondary | ICD-10-CM

## 2018-02-10 DIAGNOSIS — Z5181 Encounter for therapeutic drug level monitoring: Secondary | ICD-10-CM

## 2018-02-10 LAB — POCT INR: INR: 2.3 (ref 2.0–3.0)

## 2018-02-10 IMAGING — CR DG CHEST 1V PORT
1 series · 1 of 1 positions shown · non-contrast
Comparison: 10/02/2016 and earlier priors

CLINICAL DATA: Lightheadedness during dialysis. Aortic valve
replacement performed 2 weeks ago.

EXAM:
PORTABLE CHEST 1 VIEW

[AP]
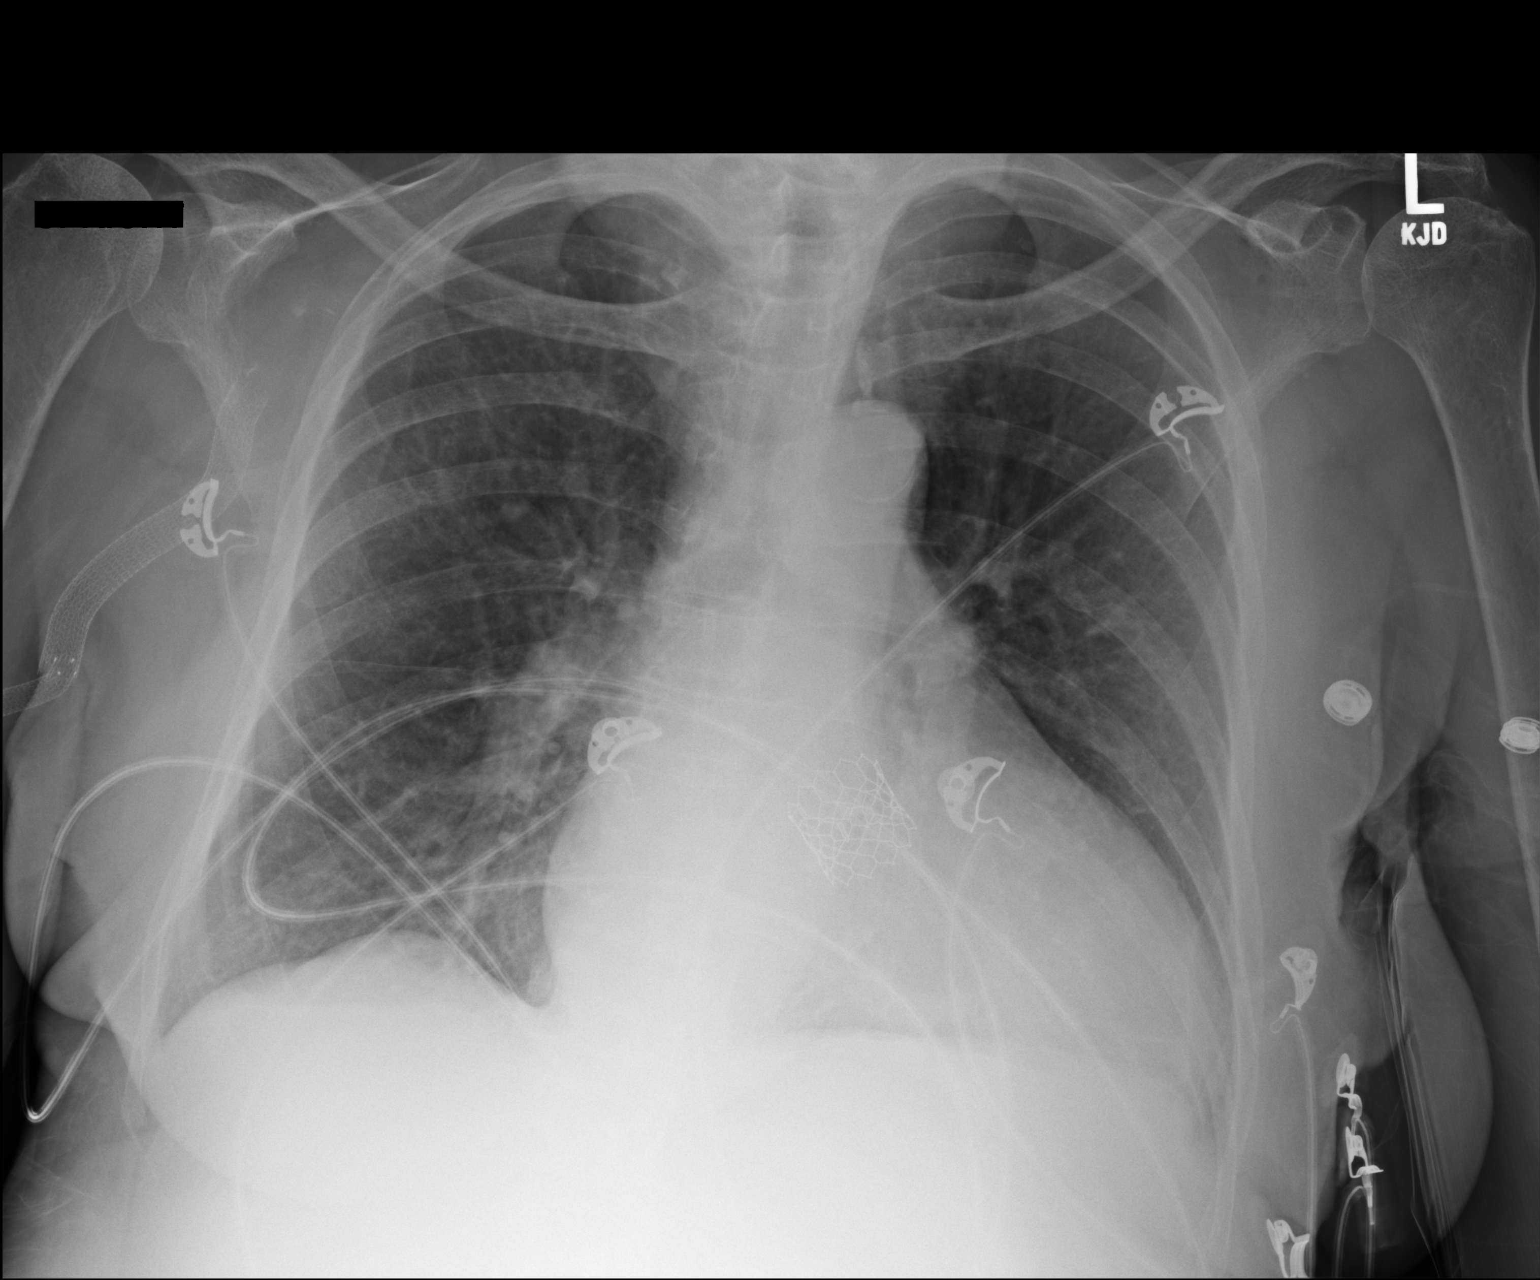

[1 of 1 positions shown; findings below may reference images not displayed]

FINDINGS: Stable cardiomegaly. Aortic valve prosthesis stable. Atherosclerotic
calcification of the aortic arch. Pulmonary vascularity within
normal limits. No focal airspace disease, pulmonary edema or pleural
effusion. Negative for pneumothorax.

Right axillary region vascular stent.
IMPRESSION: Cardiac enlargement and aortic valve replacement. No acute
abnormality identified.

## 2018-02-17 ENCOUNTER — Ambulatory Visit (INDEPENDENT_AMBULATORY_CARE_PROVIDER_SITE_OTHER): Payer: Medicare Other | Admitting: Cardiovascular Disease

## 2018-02-17 DIAGNOSIS — I482 Chronic atrial fibrillation, unspecified: Secondary | ICD-10-CM

## 2018-02-17 DIAGNOSIS — Z5181 Encounter for therapeutic drug level monitoring: Secondary | ICD-10-CM | POA: Diagnosis not present

## 2018-02-17 LAB — POCT INR: INR: 2.3 (ref 2.0–3.0)

## 2018-02-22 ENCOUNTER — Other Ambulatory Visit: Payer: Self-pay | Admitting: Cardiovascular Disease

## 2018-02-22 MED ORDER — AMIODARONE HCL 200 MG PO TABS: 200.0000 mg | ORAL_TABLET | Freq: Every day | ORAL | 7 refills | Status: DC

## 2018-02-22 NOTE — Telephone Encounter (Signed)
New Message:        *STAT* If patient is at the pharmacy, call can be transferred to refill team.   1. Which medications need to be refilled? (please list name of each medication and dose if known) amiodarone (PACERONE) 200 MG tablet  2. Which pharmacy/location (including street and city if local pharmacy) is medication to be sent to?[facility] [phone] - [place], [place] - [address]  3. Do they need a 30 day or 90 day supply? [place]

## 2018-02-22 NOTE — Telephone Encounter (Signed)
Pt's medication was sent to pt's pharmacy as requested. Confirmation received.  °

## 2018-03-02 ENCOUNTER — Ambulatory Visit (INDEPENDENT_AMBULATORY_CARE_PROVIDER_SITE_OTHER): Payer: Medicare Other | Admitting: Pharmacist

## 2018-03-02 DIAGNOSIS — I482 Chronic atrial fibrillation, unspecified: Secondary | ICD-10-CM

## 2018-03-02 DIAGNOSIS — Z5181 Encounter for therapeutic drug level monitoring: Secondary | ICD-10-CM

## 2018-03-02 LAB — POCT INR: INR: 1.7 — AB (ref 2.0–3.0)

## 2018-03-09 ENCOUNTER — Ambulatory Visit (INDEPENDENT_AMBULATORY_CARE_PROVIDER_SITE_OTHER): Payer: Medicare Other | Admitting: Internal Medicine

## 2018-03-09 DIAGNOSIS — Z5181 Encounter for therapeutic drug level monitoring: Secondary | ICD-10-CM | POA: Diagnosis not present

## 2018-03-09 DIAGNOSIS — I482 Chronic atrial fibrillation, unspecified: Secondary | ICD-10-CM

## 2018-03-09 LAB — POCT INR: INR: 1.9 — AB (ref 2.0–3.0)

## 2018-03-09 NOTE — Patient Instructions (Signed)
Description   Orders given to [facility] with [facility] while in the home, take 7.5mg  today then continue taking  5mg  daily. Recheck in 1 week.

## 2018-03-17 MED ORDER — GLUCOSE 40 % PO GEL
15.00 g | ORAL | Status: DC
Start: ? — End: 2018-03-17

## 2018-03-17 MED ORDER — GUAIFENESIN-DM 100-10 MG/5ML PO SYRP
5.00 | ORAL_SOLUTION | ORAL | Status: DC
Start: ? — End: 2018-03-17

## 2018-03-17 MED ORDER — GENERIC EXTERNAL MEDICATION
1.00 | Status: DC
Start: ? — End: 2018-03-17

## 2018-03-17 MED ORDER — GENERIC EXTERNAL MEDICATION
5.00 | Status: DC
Start: ? — End: 2018-03-17

## 2018-03-17 MED ORDER — METOPROLOL SUCCINATE ER 50 MG PO TB24
100.00 | ORAL_TABLET | ORAL | Status: DC
Start: 2018-03-18 — End: 2018-03-17

## 2018-03-17 MED ORDER — ALBUTEROL SULFATE (2.5 MG/3ML) 0.083% IN NEBU
2.50 | INHALATION_SOLUTION | RESPIRATORY_TRACT | Status: DC
Start: ? — End: 2018-03-17

## 2018-03-17 MED ORDER — DEXTROSE 50 % IV SOLN
12.00 g | INTRAVENOUS | Status: DC
Start: ? — End: 2018-03-17

## 2018-03-17 MED ORDER — ONDANSETRON HCL 4 MG/2ML IJ SOLN
4.00 | INTRAMUSCULAR | Status: DC
Start: ? — End: 2018-03-17

## 2018-03-17 MED ORDER — CALCIUM ACETATE (PHOS BINDER) 667 MG PO CAPS
667.00 | ORAL_CAPSULE | ORAL | Status: DC
Start: 2018-03-17 — End: 2018-03-17

## 2018-03-17 MED ORDER — INSULIN LISPRO 100 UNIT/ML ~~LOC~~ SOLN
1.00 | SUBCUTANEOUS | Status: DC
Start: 2018-03-17 — End: 2018-03-17

## 2018-03-17 MED ORDER — ACETAMINOPHEN 325 MG PO TABS
650.00 | ORAL_TABLET | ORAL | Status: DC
Start: ? — End: 2018-03-17

## 2018-03-17 MED ORDER — GENERIC EXTERNAL MEDICATION
1.00 g | Status: DC
Start: ? — End: 2018-03-17

## 2018-03-17 MED ORDER — AMIODARONE HCL 200 MG PO TABS
200.00 | ORAL_TABLET | ORAL | Status: DC
Start: 2018-03-18 — End: 2018-03-17

## 2018-03-17 MED ORDER — CLONIDINE HCL 0.1 MG PO TABS
0.10 | ORAL_TABLET | ORAL | Status: DC
Start: 2018-03-17 — End: 2018-03-17

## 2018-03-17 MED ORDER — AMLODIPINE BESYLATE 5 MG PO TABS
5.00 | ORAL_TABLET | ORAL | Status: DC
Start: 2018-03-18 — End: 2018-03-17

## 2018-03-17 MED ORDER — ONDANSETRON 4 MG PO TBDP
4.00 | ORAL_TABLET | ORAL | Status: DC
Start: ? — End: 2018-03-17

## 2018-03-17 MED ORDER — MULTI-VITAMINS PO TABS
1.00 | ORAL_TABLET | ORAL | Status: DC
Start: 2018-03-18 — End: 2018-03-17

## 2018-03-17 MED ORDER — DOCUSATE SODIUM 100 MG PO CAPS
100.00 | ORAL_CAPSULE | ORAL | Status: DC
Start: ? — End: 2018-03-17

## 2018-03-17 MED ORDER — FERROUS SULFATE 325 (65 FE) MG PO TABS
325.00 | ORAL_TABLET | ORAL | Status: DC
Start: 2018-03-18 — End: 2018-03-17

## 2018-03-17 MED ORDER — GENERIC EXTERNAL MEDICATION
200.00 | Status: DC
Start: 2018-03-18 — End: 2018-03-17

## 2018-03-17 MED ORDER — GENERIC EXTERNAL MEDICATION
2.25 g | Status: DC
Start: 2018-03-17 — End: 2018-03-17

## 2018-03-17 MED ORDER — CINACALCET HCL 30 MG PO TABS
30.00 | ORAL_TABLET | ORAL | Status: DC
Start: 2018-03-18 — End: 2018-03-17

## 2018-04-07 ENCOUNTER — Telehealth: Payer: Self-pay | Admitting: Cardiovascular Disease

## 2018-04-07 NOTE — Telephone Encounter (Signed)
Walk in Pt form-Angela Trujillo W/ [facility] dropped off paperwork. Placed in [place] doc box.

## 2018-04-25 ENCOUNTER — Ambulatory Visit (INDEPENDENT_AMBULATORY_CARE_PROVIDER_SITE_OTHER): Payer: Medicare Other | Admitting: Internal Medicine

## 2018-04-25 DIAGNOSIS — I482 Chronic atrial fibrillation, unspecified: Secondary | ICD-10-CM

## 2018-04-25 DIAGNOSIS — Z5181 Encounter for therapeutic drug level monitoring: Secondary | ICD-10-CM

## 2018-04-25 LAB — POCT INR: INR: 2.7 (ref 2.0–3.0)

## 2018-05-09 ENCOUNTER — Telehealth: Payer: Self-pay | Admitting: *Deleted

## 2018-05-09 NOTE — Telephone Encounter (Signed)
Pt's son Abagail Kitchens called and stated pt in now in [facility] for rehab. She was admitted there last week and no discharge date set at this time. He will call back once pt is discharged.

## 2018-07-22 DEATH — deceased

## 2019-12-13 ENCOUNTER — Encounter: Payer: Self-pay | Admitting: General Practice
